# Patient Record
Sex: Male | Born: 1955
Health system: Southern US, Community
[De-identification: ages and names within clinical notes are randomized; demographics above are authoritative.]

## PROBLEM LIST (undated history)

## (undated) DIAGNOSIS — N200 Calculus of kidney: Secondary | ICD-10-CM

## (undated) DIAGNOSIS — I483 Typical atrial flutter: Secondary | ICD-10-CM

## (undated) DIAGNOSIS — I480 Paroxysmal atrial fibrillation: Principal | ICD-10-CM

## (undated) DIAGNOSIS — R001 Bradycardia, unspecified: Secondary | ICD-10-CM

## (undated) DIAGNOSIS — E785 Hyperlipidemia, unspecified: Secondary | ICD-10-CM

## (undated) DIAGNOSIS — S8012XA Contusion of left lower leg, initial encounter: Secondary | ICD-10-CM

## (undated) DIAGNOSIS — C4491 Basal cell carcinoma of skin, unspecified: Secondary | ICD-10-CM

## (undated) DIAGNOSIS — G890 Central pain syndrome: Secondary | ICD-10-CM

## (undated) DIAGNOSIS — I77810 Thoracic aortic ectasia: Secondary | ICD-10-CM

## (undated) DIAGNOSIS — I6522 Occlusion and stenosis of left carotid artery: Secondary | ICD-10-CM

## (undated) DIAGNOSIS — G629 Polyneuropathy, unspecified: Secondary | ICD-10-CM

## (undated) DIAGNOSIS — I63512 Cerebral infarction due to unspecified occlusion or stenosis of left middle cerebral artery: Secondary | ICD-10-CM

## (undated) DIAGNOSIS — IMO0002 Reserved for concepts with insufficient information to code with codable children: Secondary | ICD-10-CM

## (undated) HISTORY — DX: Basal cell carcinoma of skin, unspecified: C44.91

## (undated) HISTORY — DX: Reserved for concepts with insufficient information to code with codable children: IMO0002

## (undated) HISTORY — DX: Calculus of kidney: N20.0

## (undated) HISTORY — DX: Polyneuropathy, unspecified: G62.9

## (undated) HISTORY — DX: Contusion of left lower leg, initial encounter: S80.12XA

## (undated) HISTORY — DX: Thoracic aortic ectasia: I77.810

## (undated) HISTORY — DX: Central pain syndrome: G89.0

## (undated) HISTORY — DX: Hyperlipidemia, unspecified: E78.5

## (undated) HISTORY — DX: Cerebral infarction due to unspecified occlusion or stenosis of left middle cerebral artery: I63.512

## (undated) HISTORY — PX: VASECTOMY: SHX75

## (undated) HISTORY — DX: Typical atrial flutter: I48.3

## (undated) HISTORY — PX: LITHOTRIPSY: SUR834

## (undated) HISTORY — DX: Occlusion and stenosis of left carotid artery: I65.22

## (undated) HISTORY — DX: Paroxysmal atrial fibrillation: I48.0

## (undated) HISTORY — DX: Bradycardia, unspecified: R00.1

---

## 1997-10-07 ENCOUNTER — Emergency Department (HOSPITAL_COMMUNITY): Admission: EM | Admit: 1997-10-07 | Discharge: 1997-10-07 | Payer: Self-pay | Admitting: Emergency Medicine

## 2008-03-09 ENCOUNTER — Ambulatory Visit: Payer: Self-pay | Admitting: Internal Medicine

## 2008-03-12 ENCOUNTER — Inpatient Hospital Stay (HOSPITAL_COMMUNITY): Admission: RE | Admit: 2008-03-12 | Discharge: 2008-03-12 | Payer: Self-pay | Admitting: General Surgery

## 2008-03-19 ENCOUNTER — Ambulatory Visit (HOSPITAL_COMMUNITY): Admission: RE | Admit: 2008-03-19 | Discharge: 2008-03-19 | Payer: Self-pay | Admitting: Urology

## 2008-03-27 ENCOUNTER — Ambulatory Visit (HOSPITAL_COMMUNITY): Admission: RE | Admit: 2008-03-27 | Discharge: 2008-03-27 | Payer: Self-pay | Admitting: Urology

## 2008-03-29 ENCOUNTER — Emergency Department (HOSPITAL_COMMUNITY): Admission: EM | Admit: 2008-03-29 | Discharge: 2008-03-29 | Payer: Self-pay | Admitting: Emergency Medicine

## 2008-06-27 ENCOUNTER — Emergency Department (HOSPITAL_COMMUNITY): Admission: EM | Admit: 2008-06-27 | Discharge: 2008-06-28 | Payer: Self-pay | Admitting: Emergency Medicine

## 2010-03-20 ENCOUNTER — Encounter: Payer: Self-pay | Admitting: Urology

## 2010-06-07 LAB — URINALYSIS, ROUTINE W REFLEX MICROSCOPIC
Glucose, UA: NEGATIVE mg/dL
Ketones, ur: NEGATIVE mg/dL
Leukocytes, UA: NEGATIVE
Nitrite: NEGATIVE
Protein, ur: NEGATIVE mg/dL
pH: 6 (ref 5.0–8.0)

## 2010-06-07 LAB — URINE MICROSCOPIC-ADD ON

## 2010-06-13 LAB — BASIC METABOLIC PANEL
BUN: 10 mg/dL (ref 6–23)
BUN: 9 mg/dL (ref 6–23)
CO2: 27 mEq/L (ref 19–32)
CO2: 29 mEq/L (ref 19–32)
Calcium: 8.6 mg/dL (ref 8.4–10.5)
Calcium: 9.1 mg/dL (ref 8.4–10.5)
Chloride: 104 mEq/L (ref 96–112)
Creatinine, Ser: 0.85 mg/dL (ref 0.4–1.5)
Creatinine, Ser: 0.9 mg/dL (ref 0.4–1.5)
Creatinine, Ser: 0.98 mg/dL (ref 0.4–1.5)
GFR calc Af Amer: >60 mL/min (ref >60)
GFR calc Af Amer: >60 mL/min (ref >60)
GFR calc non Af Amer: >60 mL/min (ref >60)
GFR calc non Af Amer: >60 mL/min (ref >60)
GFR calc non Af Amer: >60 mL/min (ref >60)
Glucose, Bld: 116 mg/dL — ABNORMAL HIGH (ref 70–99)
Glucose, Bld: 98 mg/dL (ref 70–99)
Potassium: 3.9 mEq/L (ref 3.5–5.1)
Potassium: 3.9 mEq/L (ref 3.5–5.1)
Sodium: 139 mEq/L (ref 135–145)

## 2010-06-13 LAB — CBC
HCT: 39.3 % (ref 39.0–52.0)
Hemoglobin: 14.4 g/dL (ref 13.0–17.0)
MCHC: 33.6 g/dL (ref 30.0–36.0)
MCHC: 33.9 g/dL (ref 30.0–36.0)
Platelets: 239 10*3/uL (ref 150–400)
Platelets: 246 10*3/uL (ref 150–400)
Platelets: 268 10*3/uL (ref 150–400)
RBC: 4.31 MIL/uL (ref 4.22–5.81)
RDW: 12.5 % (ref 11.5–15.5)
RDW: 12.8 % (ref 11.5–15.5)
WBC: 14.3 10*3/uL — ABNORMAL HIGH (ref 4.0–10.5)

## 2010-06-13 LAB — DIFFERENTIAL
Basophils Absolute: 0 10*3/uL (ref 0.0–0.1)
Basophils Relative: 0 % (ref 0–1)
Monocytes Absolute: 0.3 10*3/uL (ref 0.1–1.0)
Neutro Abs: 12.4 10*3/uL — ABNORMAL HIGH (ref 1.7–7.7)
Neutrophils Relative %: 88 % — ABNORMAL HIGH (ref 43–77)

## 2010-06-13 LAB — HEMOGLOBIN AND HEMATOCRIT, BLOOD
HCT: 38 % — ABNORMAL LOW (ref 39.0–52.0)
Hemoglobin: 12.8 g/dL — ABNORMAL LOW (ref 13.0–17.0)

## 2010-06-13 LAB — TYPE AND SCREEN
ABO/RH(D): A POS
Antibody Screen: NEGATIVE

## 2010-07-12 NOTE — Op Note (Signed)
NAMEKANON, [name]            ACCOUNT NO.:  [id number]   MEDICAL RECORD NO.:  [id number]          PATIENT TYPE:  AMB   LOCATION:  DAY                          FACILITY:  [facility]   PHYSICIAN:  Bertram Millard. Dahlstedt, M.D.DATE OF BIRTH:  09-10-1955   DATE OF PROCEDURE:  03/27/2008  DATE OF DISCHARGE:                               OPERATIVE REPORT   SURGEON:  Bertram Millard. Dahlstedt, M.D.   FIRST ASSISTANT:  Delia Chimes, NP   ANESTHESIA:  General.   COMPLICATIONS:  None.   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  He  was originally diagnosed 14 years ago.  He has been delaying treatment.   The patient has undergone prior percutaneous nephrolithotomy two weeks  ago followed by a lithotripsy of a difficult to access mid/upper pole  stone.  He now has stones in his lower pole calyx.  He appears today for  second look percutaneous nephrolithotomy.  The risks and complications  were discussed with the patient.  He understands these and desires to  proceed.   DESCRIPTION OF PROCEDURE:  The patient was administered preoperative IV  antibiotics.  He was identified in the holding area, and the surgical  site marked.  He was taken to the operating room where general  anesthetic was administered using endotracheal apparatus.  He was placed  in the prone position.  His bladder was catheterized.  His right flank  was prepped and draped.  With fluoroscopic guidance, the previously-  placed percutaneous nephrostomy tube was removed, and a guide wire  placed for access to the right renal pelvis.  I then dilated this to 55  [place] with nephrostomy dilators.  We then attached a flexible  cystoscope.  Multiple stones were identified.  A guide wire was placed  down into the ureter.  Because of the flow of the water, and the size of  the stone, it was hard to grasp these with the flexible scope.  I  then  dilated the tract to 8 [place] and left the nephrostomy sheath in.  I  then used a rigid scope to remove a significant number of fragments.  Most of these have settled down to the lower pole calyx medially.  Some  of these large fragments were still in the pelvis, however.  It took  about an hour and a half to remove these multiple fragments.  A couple  of the fragments have been dislodged into the nephrostomy tract.  These  were eventually grasped towards the end of the procedure.  There were  several very tiny fragments left, some of them fluoroscopically and some  nephroscopically, I could not see any sizable fragments left.  After  vigorous identification of all the calyces I could see and no large  fragments left behind, I stopped the procedure.  I left an 18-French  council-tip catheter in the renal pelvis, with a balloon filled with  approximately 4 cc of saline.  This was verified with contrast and  showed adequate placement.  The catheter  was sutured to the skin with 3-  0 nylon.  It was  then hooked to dependent drainage.  the patient tolerated the procedure  well.  All stone fragments were given to the patient's wife.  He will be  sent home to follow up in 3 days.  He was discharged on Cipro 500 mg  daily for 5 days and Vicodin ES 1 p.o. q 4 hours p.r.n. pain.      Bertram Millard. Dahlstedt, M.D.  Electronically Signed     SMD/MEDQ  D:  03/27/2008  T:  03/27/2008  Job:  [id number]

## 2010-07-12 NOTE — Discharge Summary (Signed)
NAMESHARVIL, [name]            ACCOUNT NO.:  [id number]   MEDICAL RECORD NO.:  [id number]          PATIENT TYPE:  INP   LOCATION:  1415                         FACILITY:  [facility]   PHYSICIAN:  Bertram Millard. Dahlstedt, M.D.DATE OF BIRTH:  1955-10-05   DATE OF ADMISSION:  03/09/2008  DATE OF DISCHARGE:  03/12/2008                               DISCHARGE SUMMARY   DISCHARGE DIAGNOSIS:  1. Right staghorn stone, status post first-stage percutaneous      nephrolithotomy.  2. Episodic atrial fibrillation, resolved.   PROCEDURES:  Right percutaneous nephrolithotomy done March 09, 2008.   HISTORY OF PRESENT ILLNESS:  In brief, [name] is a 55 year old  gentleman who was admitted after his right first-look percutaneous  nephrolithotomy.  He has a history of stone disease and after discussion  of the risks and benefits of percutaneous nephrolithotomy and  alternative therapy, he elected to proceed with the aforementioned  surgery.   HOSPITAL COURSE:  The patient was admitted after his right-sided  percutaneous nephrolithotomy.  The patient's nephrostomy tube was  downsized to a smaller tube on postoperative day #1 in interventional  radiology.  His Foley catheter was removed and he voided well.  He  tolerated a regular diet.  However on postoperative night #1 the patient  had one episode of atrial fibrillation.  He converted to a regular  rhythm on a calcium channel blocker IV drip.  He was then converted to  oral metoprolol after cardiac consultation and did well subsequently.  At the time of discharge the patient was ambulating, tolerating a  regular diet, in a regular rhythm and had adequate pain control.  Follow-  up arrangements had been made with the patient prior to his discharge.   DISCHARGE DISPOSITION:  He will be discharged home in good condition.   FOLLOW UP:  1. He will follow up with neurology for further management of the      stones.  2. He will follow up with  cardiology per their recommendations.   DISCHARGE MEDICATIONS:  Please see reconciliation sheet.      Matthew Kitten, MD      Bertram Millard. Dahlstedt, M.D.  Electronically Signed    DW/MEDQ  D:  03/13/2008  T:  03/13/2008  Job:  [id number]

## 2010-07-12 NOTE — Consult Note (Signed)
NAMEMARCQUES, [name]            ACCOUNT NO.:  [id number]   MEDICAL RECORD NO.:  [id number]          PATIENT TYPE:  AMB   LOCATION:  DAY                          FACILITY:  [facility]   PHYSICIAN:  Wendi Snipes, MD DATE OF BIRTH:  1955-10-26   DATE OF CONSULTATION:  DATE OF DISCHARGE:                                 CONSULTATION   CHIEF COMPLAINT:  Racing heart.   HISTORY OF PRESENT ILLNESS:  This is a 55 year old white male with a  history of kidney stones who is here status post percutaneous  nephrolithotomy, now found to be in atrial fibrillation with rapid  ventricular response.  The patient states that he reported palpitations  around 12 o'clock, and it appears that he went into atrial fibrillation  shortly after 12 o'clock in a.m. on the morning of March 11, 2008.  He  states that he has had this condition once before.  However, he states  that spontaneously converted to normal sinus before treatment.  This  happened years ago.  Otherwise, he states that he has not experienced  palpitations during this time.  Denies chest pain, shortness of breath,  paroxysmal nocturnal dyspnea though he states that he is constipated.   PAST MEDICAL HISTORY:  1. Kidney stones.  2. Horseshoe kidney.   MEDICATIONS AT HOME:  Allegra D.   ALLERGIES:  NO KNOWN DRUG ALLERGIES.   SOCIAL HISTORY:  He is a nonsmoker.   FAMILY HISTORY:  His father died of congestive heart failure at 6.   REVIEW OF SYSTEMS:  All 14 systems were reviewed and were negative  except as mentioned in HPI.   PHYSICAL EXAMINATION:  VITAL SIGNS:  Blood pressure is 120/80, heart  rate is 143 beats per minute, respirations 16.  GENERAL:  He is a 55 year old white male appearing stated age.  No acute  distress.  HEENT:  Moist mucous membranes.  Pupils equal, round, reactive to light  and accommodation.  Anicteric sclera.  NECK:  No jugular venous distention.  No thyromegaly.  CARDIOVASCULAR:  Irregularly irregular  rhythm, tachycardiac.  No  murmurs, rubs or gallops.  PULMONARY:  Clear to auscultation bilaterally.  ABDOMEN:  Nontender, nondistended.  Positive bowel sounds.  No masses.  EXTREMITIES:  No clubbing, cyanosis or edema, 2+ pulses throughout.  NEUROLOGICAL:  Alert and oriented x3.  Cranial nerves II-XII grossly  intact.  No focal neurologic deficits.  SKIN:  Warm, dry and intact.  No rashes.  There is a percutaneous tube  that is draining on his right flank.  PSYCHIATRIC:  Mood and affect are appropriate.   STUDIES:  EKG shows atrial fibrillation with rapid ventricular response  and ST and T-wave abnormality.  Telemetry showed normal sinus rhythm  with intermittent atrial fibrillation.   ASSESSMENT/PLAN:  New-onset atrial fibrillation.  This is likely driven  by elevated catecholamines in his post procedural state.  He should  convert with the rate control.  In this context, beta blockade would be  appropriate given that he has intermittent sinus rhythm.  Will give him  metoprolol 5 mg IV now and then start p.o. dose of  25 mg twice daily.  Will recommend check an echocardiogram and the patient will be a good  candidate for DC cardioversion in the morning if atrial fibrillation  persists.      Wendi Snipes, MD  Electronically Signed     BHH/MEDQ  D:  03/11/2008  T:  03/11/2008  Job:  [id number]

## 2010-07-12 NOTE — Op Note (Signed)
[name], [name]            ACCOUNT NO.:  [id number]   MEDICAL RECORD NO.:  [id number]          PATIENT TYPE:  AMB   LOCATION:  DAY                          FACILITY:  [facility]   PHYSICIAN:  Bertram Millard. Dahlstedt, M.D.DATE OF BIRTH:  06-11-1955   DATE OF PROCEDURE:  03/09/2008  DATE OF DISCHARGE:                               OPERATIVE REPORT   ASSISTANT:  Dr. Duane Boston.   PREOPERATIVE DIAGNOSIS:  Right renal stone, greater than 2 cm.   POSTOPERATIVE DIAGNOSIS:  Right renal stone, greater than 2 cm.   PROCEDURES:  1. Percutaneous dilation of nephrostomy tract.  2. Rigid renoscopy with lithoclast lithotripsy.  3. Renoscopy with laser lithotripsy and stone extraction.  4. Placement of the right nephrostomy tube.   INDICATIONS:  This is a 55 year old gentleman with a history of a large  right renal stone.  After a discussion of the risks and benefits,  including alternatives, he elected to proceed with a right percutaneous  nephrolithotomy.   PROCEDURE IN DETAIL:  The patient was brought back to the operating room  and after the successful induction of general endotracheal anesthetic, a  Foley catheter was placed and he was placed in the prone position.  All  pressure points were padded appropriately and chest rolls were in place.  A preoperative timeout was performed.  He received preoperative  antibiotics.   With the help of the interventional radiologist, his previously placed  access sheath sites were both dilated with balloon dilator and access  sheaths were placed over this.  Renoscopy was done and a large stone  could be seen in his right renal unit.  Using renoscopy and a lithoclast  device, the stone was fragmented.  The larger fragments were removed  with a two-prong grasper.  The rest were removed with suction with the  lithoclast device.  Once we achieved access to the pelvis and the UPJ,  we worked on the stone in the upper collecting system.  At this point,  we reached the limits of our renoscope and used the flexible cystoscope  to navigate in the upper pole.  Using the laser, we lasered part of the  large upper pole stone, however, the laser to break.  At this point, we  removed the flexible scope and dilated the lower pole access.  An access  sheath was placed and we were unsuccessful in using this access site to  get direct visualization of the upper stone.  At this point given the  length of the case and multiple access sites uses, we elected to  terminate the procedure.  The lower pole access sheath was removed and  using the interpolar access, we placed a nephrostomy tube under direct  fluoroscopic visualization.  Four mL of water were placed in the balloon  and drain was sutured to the skin.  All the tubes and wires were  removed.  At this point, we used silk to close the skin incision at the  lower pole access site and the procedure was ended.  Please note, Dr.  Retta Diones was present throughout the entire case.  Estimated blood loss  minimal.  Urine output unrecorded.   DRAINS:  1. 18-French right nephrostomy tube.  2. Foley catheter.   SPECIMEN:  Stone for stone analysis.   DISPOSITION:  The patient will be sent to the PACU for further care.      Delman Kitten, MD      Bertram Millard. Dahlstedt, M.D.  Electronically Signed    DW/MEDQ  D:  03/09/2008  T:  03/09/2008  Job:  [id number]

## 2013-07-06 ENCOUNTER — Encounter: Payer: Self-pay | Admitting: *Deleted

## 2013-07-06 DIAGNOSIS — I4891 Unspecified atrial fibrillation: Secondary | ICD-10-CM

## 2014-02-27 DIAGNOSIS — I639 Cerebral infarction, unspecified: Secondary | ICD-10-CM

## 2014-02-27 HISTORY — DX: Cerebral infarction, unspecified: I63.9

## 2014-05-19 ENCOUNTER — Encounter: Payer: Self-pay | Admitting: Cardiology

## 2014-05-19 ENCOUNTER — Ambulatory Visit (INDEPENDENT_AMBULATORY_CARE_PROVIDER_SITE_OTHER): Payer: BLUE CROSS/BLUE SHIELD | Admitting: Cardiology

## 2014-05-19 VITALS — BP 110/72 | HR 65 | Ht 74.0 in | Wt 209.0 lb

## 2014-05-19 DIAGNOSIS — R002 Palpitations: Secondary | ICD-10-CM

## 2014-05-19 DIAGNOSIS — I480 Paroxysmal atrial fibrillation: Secondary | ICD-10-CM

## 2014-05-19 DIAGNOSIS — R0602 Shortness of breath: Secondary | ICD-10-CM | POA: Diagnosis not present

## 2014-05-19 NOTE — Progress Notes (Signed)
Cardiology Office Note   Date:  05/19/2014   ID:  FAMOUS EISENHARDT, DOB 09/24/1955, MRN [id number]  PCP:  No primary care provider on file.  Cardiologist:   Sueanne Margarita, MD   No chief complaint on file.     History of Present Illness: [name] is a 59 y.o. male who presents for evaluation of SOB.  He had a normal stres test in January of 2010.  He had a bout of atrial fibrillation after kidney surgery and was on blood thinners for a while and stopped this on his own and has not followed back up with Cardiology.  His CHADS2VASC score is 0.  He was recently seen by Dr. Rex Kras complaining of several months of SOB with exertion playing tennis.  He plays weekly and has really noticed over the past month that he does not feel right when he plays.  He denies any chest pain, dizziness or LE edema.  He has also noticed that his heart is racing on occasion.  He has tried to cut down on caffeine which has helped some.  He denies any exercise intolerance.     Past Medical History  Diagnosis Date  . Atrial fibrillation   . Basal cell carcinoma   . Nephrolithiasis     Past Surgical History  Procedure Laterality Date  . Lithotripsy    . Vasectomy       Current Outpatient Prescriptions  Medication Sig Dispense Refill  . aspirin 81 MG tablet Take 81 mg by mouth daily.     No current facility-administered medications for this visit.    Allergies:   Review of patient's allergies indicates no known allergies.    Social History:  The patient  reports that he has never smoked. He does not have any smokeless tobacco history on file. He reports that he drinks alcohol. He reports that he does not use illicit drugs.   Family History:  The patient's family history includes Cancer in his mother; Heart failure in his father.    ROS:  Please see the history of present illness.   Otherwise, review of systems are positive for none.   All other systems are reviewed and negative.     PHYSICAL EXAM: VS:  BP 110/72 mmHg  Pulse 65  Ht 6\' 2"  (1.88 m)  Wt 209 lb (94.802 kg)  BMI 26.82 kg/m2 , BMI Body mass index is 26.82 kg/(m^2). GEN: Well nourished, well developed, in no acute distress HEENT: normal Neck: no JVD, carotid bruits, or masses Cardiac: RRR; no murmurs, rubs, or gallops,no edema  Respiratory:  clear to auscultation bilaterally, normal work of breathing GI: soft, nontender, nondistended, + BS MS: no deformity or atrophy Skin: warm and dry, no rash Neuro:  Strength and sensation are intact Psych: euthymic mood, full affect   EKG:  EKG is ordered today. The ekg ordered today demonstrates NSR at 65bpm with no ST changes and Bi atrial enlargement.     Recent Labs: No results found for requested labs within last 365 days.    Lipid Panel No results found for: CHOL, TRIG, HDL, CHOLHDL, VLDL, LDLCALC, LDLDIRECT    Wt Readings from Last 3 Encounters:  05/19/14 209 lb (94.802 kg)        ASSESSMENT AND PLAN:  1.  SOB with exertion mainly playing tennis but other activities do not bother him.  He denies any chest pain or pressure.  EKG is on ischemic.  I will check a 2D  echo to assess LVF.  I will check a stress myoview to rule out ischemia. 2.  PAF in the setting of kidney stone surgery with no reoccurence.  His CHADS2VASC score is 0.  He is in NSR today. 3.  Palpitations which he has been aware of recently.  Will get an event monitor to assess for PAF.  They do not occur frequently enough to be captured on a 24 hour Holter so need a 30 day monitor.       Current medicines are reviewed at length with the patient today.  The patient does not have concerns regarding medicines.  The following changes have been made:  no change  Labs/ tests ordered today include: stress myoview, 2D echo to assess LVF, 30 day event monitor  No orders of the defined types were placed in this encounter.     Disposition:   FU with me in 1  year   Signed, Sueanne Margarita, MD  05/19/2014 2:13 PM    [facility] [address], [place], [place]  [address] Phone: [phone]; Fax: [phone]

## 2014-05-19 NOTE — Patient Instructions (Addendum)
Your physician has requested that you have an echocardiogram. Echocardiography is a painless test that uses sound waves to create images of your heart. It provides your doctor with information about the size and shape of your heart and how well your heart's chambers and valves are working. This procedure takes approximately one hour. There are no restrictions for this procedure.  Dr. Radford Pax recommends you have a NUCLEAR STRESS TEST.  Your physician has recommended that you wear an event monitor. Event monitors are medical devices that record the heart's electrical activity. Doctors most often [place] these monitors to diagnose arrhythmias. Arrhythmias are problems with the speed or rhythm of the heartbeat. The monitor is a small, portable device. You can wear one while you do your normal daily activities. This is usually used to diagnose what is causing palpitations/syncope (passing out).  Your physician wants you to follow-up in: 1 year with Dr. Radford Pax. You will receive a reminder letter in the mail two months in advance. If you don't receive a letter, please call our office to schedule the follow-up appointment.

## 2014-05-20 ENCOUNTER — Ambulatory Visit (HOSPITAL_COMMUNITY): Payer: BLUE CROSS/BLUE SHIELD | Attending: Cardiology | Admitting: Radiology

## 2014-05-20 DIAGNOSIS — R0602 Shortness of breath: Secondary | ICD-10-CM | POA: Diagnosis not present

## 2014-05-20 DIAGNOSIS — I480 Paroxysmal atrial fibrillation: Secondary | ICD-10-CM

## 2014-05-20 NOTE — Progress Notes (Signed)
Echocardiogram performed.  

## 2014-05-22 ENCOUNTER — Telehealth: Payer: Self-pay

## 2014-05-22 DIAGNOSIS — R0602 Shortness of breath: Secondary | ICD-10-CM

## 2014-05-22 NOTE — Telephone Encounter (Signed)
Lab appointment scheduled for Tuesday and BNP ordered.

## 2014-05-22 NOTE — Telephone Encounter (Signed)
-----   Message from Antonieta Iba, RN sent at 05/21/2014 12:20 PM EDT ----- Patient called back to get ECHO results from yesterday. Notified patient of results and notations by Dr. Radford Pax with request for him to come in for BNP. Patient verbalized understanding and agreement with plan. States he can come in for the blood work next Tuesday, 05/26/14.

## 2014-05-26 ENCOUNTER — Other Ambulatory Visit (INDEPENDENT_AMBULATORY_CARE_PROVIDER_SITE_OTHER): Payer: BLUE CROSS/BLUE SHIELD | Admitting: *Deleted

## 2014-05-26 DIAGNOSIS — R0602 Shortness of breath: Secondary | ICD-10-CM

## 2014-05-26 LAB — BRAIN NATRIURETIC PEPTIDE: Pro B Natriuretic peptide (BNP): 60 pg/mL (ref 0.0–100.0)

## 2014-05-28 ENCOUNTER — Ambulatory Visit (HOSPITAL_COMMUNITY): Payer: BLUE CROSS/BLUE SHIELD | Attending: Cardiovascular Disease | Admitting: Radiology

## 2014-05-28 DIAGNOSIS — R0602 Shortness of breath: Secondary | ICD-10-CM | POA: Diagnosis not present

## 2014-05-28 MED ORDER — TECHNETIUM TC 99M SESTAMIBI GENERIC - CARDIOLITE
11.0000 | Freq: Once | INTRAVENOUS | Status: AC | PRN
  Administered 2014-05-28: 11 via INTRAVENOUS

## 2014-05-28 MED ORDER — TECHNETIUM TC 99M SESTAMIBI GENERIC - CARDIOLITE
33.0000 | Freq: Once | INTRAVENOUS | Status: AC | PRN
  Administered 2014-05-28: 33 via INTRAVENOUS

## 2014-05-28 NOTE — Progress Notes (Signed)
[name] [address] [place], [place] [address] [phone]    Cardiology Nuclear Med Study  [name] is a 59 y.o. male     MRN : [id number]     DOB: September 07, 1955  Procedure Date: 05/28/2014  Nuclear Med Background Indication for Stress Test:  Evaluation for Ischemia, and Surgical Clearance for (L) Ear Surgery on 07-23-2014 by Radene Journey, MD History:  MPI 2010 (ok per patient) Cardiac Risk Factors: None  Symptoms:  DOE, Palpitations and SOB   Nuclear Pre-Procedure Caffeine/Decaff Intake:  None> 12hrs NPO After: 11:00pm   Lungs:  clear O2 Sat: 97% on room air. IV 0.9% NS with Angio Cath:  22g  IV Site: R Antecubital x 1, tolerated well IV Started by:  Irven Baltimore, RN  Chest Size (in):  42 Cup Size: n/a  Height: 6\' 2"  (1.88 m)  Weight:  206 lb (93.441 kg)  BMI:  Body mass index is 26.44 kg/(m^2). Tech Comments:  N/A    Nuclear Med Study 1 or 2 day study: 1 day  Stress Test Type:  Stress  Reading MD: N/A  Order Authorizing Provider:  Fransico Him, MD  Resting Radionuclide: Technetium 37m Sestamibi  Resting Radionuclide Dose: 11.0 mCi   Stress Radionuclide:  Technetium 85m Sestamibi  Stress Radionuclide Dose: 33.0 mCi           Stress Protocol Rest HR: 54 Stress HR: 148  Rest BP: 132/76 Stress BP: 155/67  Exercise Time (min): 11:15 METS: 13.4   Predicted Max HR: 159 bpm % Max HR: 93.08 bpm Rate Pressure Product: 74128   Dose of Adenosine (mg):  n/a Dose of Lexiscan: n/a mg  Dose of Atropine (mg): n/a Dose of Dobutamine: n/a mcg/kg/min (at max HR)  Stress Test Technologist: Glade Lloyd, BS-ES  Nuclear Technologist:  Earl Many, CNMT     Rest Procedure:  Myocardial perfusion imaging was performed at rest 45 minutes following the intravenous administration of Technetium 36m Sestamibi. Rest ECG: NSR - Normal EKG  Stress Procedure:  The patient exercised on the treadmill utilizing the Bruce Protocol for 11:15 minutes.  The patient stopped due to fatigue and denied any chest pain.  Technetium 6m Sestamibi was injected at peak exercise and myocardial perfusion imaging was performed after a brief delay. Stress ECG: Borderline 1 mm horizontal ST depression in leads V6 and aVF  QPS Raw Data Images:  Normal; no motion artifact; normal heart/lung ratio. Stress Images:  Normal homogeneous uptake in all areas of the myocardium. Rest Images:  Normal homogeneous uptake in all areas of the myocardium. Subtraction (SDS):  No evidence of ischemia. Transient Ischemic Dilatation (Normal <1.22):  0.90 Lung/Heart Ratio (Normal <0.45):  0.37  Quantitative Gated Spect Images QGS EDV:  119 ml QGS ESV:  44 ml  Impression Exercise Capacity:  Good exercise capacity. BP Response:  Normal blood pressure response. Clinical Symptoms:  No significant symptoms noted. ECG Impression:  Borderline ST abnormalities consistent with ischemia. Comparison with Prior Nuclear Study: No previous nuclear study performed  Overall Impression:  Normal stress nuclear study.Borderline ECG changes may represent a "false positive response".  LV Ejection Fraction: 63%.  LV Wall Motion:  NL LV Function; NL Wall Motion   Sanda Klein, MD, [facility] [phone] office [phone] pager

## 2014-06-01 ENCOUNTER — Telehealth: Payer: Self-pay | Admitting: Cardiology

## 2014-06-01 NOTE — Telephone Encounter (Signed)
New Message      Pt calling stating that he isn't supposed to get his monitor until 06/22/14 b/c he will be going on vacation before than and didn't want to have to wear the monitor while at the beach. Pt also states that his upcoming ear surgery is scheduled for 07/23/14 and he wants to know if he needs to get monitor earlier than when he is scheduled to get it because he doesn't know if there will be enough time in between when he is supposed to be turning in his monitor and when he is supposed to have his surgery for Dr. Radford Pax to have all of the information she needs. Please call back and advise.

## 2014-06-01 NOTE — Telephone Encounter (Signed)
Calling back after I spoke w/him notifying him of stress test results.  States he forgot to ask that he was scheduled for a monitor to be placed but he changed the date because he is getting ready to go to beach.  He rescheduled for 4/25 but then he forgot that he is having ear surgery on 5/26 and wanted to know if would interfere with results.  Advised would not interfere and to come on 4/25 to have monitor placed.  He verbalizes understanding.

## 2014-06-22 ENCOUNTER — Encounter: Payer: Self-pay | Admitting: *Deleted

## 2014-06-22 ENCOUNTER — Telehealth: Payer: Self-pay | Admitting: Cardiology

## 2014-06-22 ENCOUNTER — Encounter (INDEPENDENT_AMBULATORY_CARE_PROVIDER_SITE_OTHER): Payer: BLUE CROSS/BLUE SHIELD

## 2014-06-22 DIAGNOSIS — R002 Palpitations: Secondary | ICD-10-CM

## 2014-06-22 NOTE — Telephone Encounter (Signed)
Event monitor called pt with a fib rates 68-144. I called the pt and no answer I left a message.  His CHA2DS2VASc score is 0.  Will notify Dr. Radford Pax.

## 2014-06-22 NOTE — Progress Notes (Signed)
Patient ID: [name], male   DOB: 1955-11-24, 59 y.o.   MRN: [id number] [employer] 30 day cardiac event monitor applied to patient.

## 2014-06-23 ENCOUNTER — Telehealth: Payer: Self-pay | Admitting: Nurse Practitioner

## 2014-06-23 NOTE — Telephone Encounter (Signed)
Received a call from [employer] reporting that [name] is back in afib between the 140's and low 200's.  They tried to contact him but got no answer.  I called all three numbers listed in Epic and there was no answer.  I left a message at his home number.  Marquette Old 7:11 PM, 06/23/2014

## 2014-06-23 NOTE — Telephone Encounter (Signed)
Have not been able to get in touch with patient.  Please call to let him know that he is having PAF and I would like him to start Lopressor 12.5mg  BID and followup with extender in 1 week - continue to wear heart monitor

## 2014-06-24 MED ORDER — METOPROLOL TARTRATE 25 MG PO TABS: 12.5000 mg | ORAL_TABLET | Freq: Two times a day (BID) | ORAL | Status: DC

## 2014-06-24 NOTE — Addendum Note (Signed)
Addended by: Toni Amend D on: 06/24/2014 08:30 AM   Modules accepted: Orders

## 2014-06-24 NOTE — Telephone Encounter (Signed)
Spoke to patient. He states he was playing tennis when he [employer] said he went into PAF. They advised him to go to ED but he stated he was asymptomatic and knew it was the increased activity that prompted it so he deferred their advisement. He states he plays tennis at least weekly in a league. Advised him that I would inform Dr. Radford Pax his activity and circumstances. Informed him of her recommendations. He verified that he would start the Lopressor 12.5 mg twice daily and he would call back later today to make an appointment with an extender. He also will continue to wear the heart monitor. Provided education on Lopressor and patient indicated understanding and agreement with treatment plan.

## 2014-06-24 NOTE — Telephone Encounter (Signed)
[employer] Event Report faxed over for event of PAF below from 4/26 @ 5:25 pm (cst). DOD, Dr. Acie Fredrickson, reviewed. Dr. Radford Pax will continue to monitor.

## 2014-06-24 NOTE — Telephone Encounter (Signed)
Will continue to monitor.

## 2014-06-24 NOTE — Telephone Encounter (Signed)
Patient notified of Dr. Theodosia Blender response. Patient aware rx sent to pharmacy - he state she will pick it up at lunchtime today.

## 2014-08-05 ENCOUNTER — Telehealth: Payer: Self-pay | Admitting: Cardiology

## 2014-08-05 NOTE — Telephone Encounter (Signed)
Left message to call back  

## 2014-08-05 NOTE — Telephone Encounter (Signed)
Patient appears to have had some PAF for several days after starting metoprolol.   Please find out if he is still having palpitations

## 2014-08-06 ENCOUNTER — Encounter: Payer: Self-pay | Admitting: Cardiology

## 2014-08-06 NOTE — Telephone Encounter (Signed)
This encounter was created in error - please disregard.

## 2014-08-06 NOTE — Telephone Encounter (Signed)
Informed patient of results and verbal understanding expressed.   Patient st that he has not had any noticeable symptoms since starting the metoprolol.  He has no complaints of palpitations at all and says he feels great.

## 2014-08-06 NOTE — Telephone Encounter (Signed)
New problem ° ° °Pt returning your call. °

## 2014-08-08 NOTE — Telephone Encounter (Signed)
Please find out if patient was feeling palpitations when he had his bouts of afib

## 2014-08-12 NOTE — Telephone Encounter (Signed)
Left message to call back  

## 2014-08-27 NOTE — Telephone Encounter (Signed)
Patient st he had palpitations when he had Fib. He complains of no palpitations now and says he feels great - "the metoprolol works wonders." Instructed patient to call back if he has any questions or concerns or develops symptoms.

## 2014-09-03 ENCOUNTER — Encounter: Payer: Self-pay | Admitting: Cardiology

## 2014-09-24 ENCOUNTER — Inpatient Hospital Stay (HOSPITAL_COMMUNITY)
Admission: EM | Admit: 2014-09-24 | Discharge: 2014-09-29 | DRG: 064 | Disposition: A | Discharge status: Rehabilitation Facility | Payer: BLUE CROSS/BLUE SHIELD | Attending: Internal Medicine | Admitting: Internal Medicine

## 2014-09-24 ENCOUNTER — Emergency Department (HOSPITAL_COMMUNITY): Payer: BLUE CROSS/BLUE SHIELD

## 2014-09-24 ENCOUNTER — Encounter (HOSPITAL_COMMUNITY): Payer: Self-pay | Admitting: Emergency Medicine

## 2014-09-24 DIAGNOSIS — I10 Essential (primary) hypertension: Secondary | ICD-10-CM | POA: Diagnosis present

## 2014-09-24 DIAGNOSIS — I6529 Occlusion and stenosis of unspecified carotid artery: Secondary | ICD-10-CM | POA: Insufficient documentation

## 2014-09-24 DIAGNOSIS — I639 Cerebral infarction, unspecified: Secondary | ICD-10-CM | POA: Diagnosis present

## 2014-09-24 DIAGNOSIS — I63412 Cerebral infarction due to embolism of left middle cerebral artery: Principal | ICD-10-CM | POA: Diagnosis present

## 2014-09-24 DIAGNOSIS — I6522 Occlusion and stenosis of left carotid artery: Secondary | ICD-10-CM

## 2014-09-24 DIAGNOSIS — R4701 Aphasia: Secondary | ICD-10-CM | POA: Diagnosis present

## 2014-09-24 DIAGNOSIS — G9340 Encephalopathy, unspecified: Secondary | ICD-10-CM | POA: Diagnosis present

## 2014-09-24 DIAGNOSIS — E785 Hyperlipidemia, unspecified: Secondary | ICD-10-CM | POA: Diagnosis present

## 2014-09-24 DIAGNOSIS — Z85828 Personal history of other malignant neoplasm of skin: Secondary | ICD-10-CM

## 2014-09-24 DIAGNOSIS — Z7982 Long term (current) use of aspirin: Secondary | ICD-10-CM

## 2014-09-24 DIAGNOSIS — I63512 Cerebral infarction due to unspecified occlusion or stenosis of left middle cerebral artery: Secondary | ICD-10-CM

## 2014-09-24 DIAGNOSIS — I4819 Other persistent atrial fibrillation: Secondary | ICD-10-CM | POA: Diagnosis present

## 2014-09-24 DIAGNOSIS — I6523 Occlusion and stenosis of bilateral carotid arteries: Secondary | ICD-10-CM | POA: Diagnosis present

## 2014-09-24 DIAGNOSIS — I480 Paroxysmal atrial fibrillation: Secondary | ICD-10-CM | POA: Diagnosis present

## 2014-09-24 LAB — URINALYSIS, ROUTINE W REFLEX MICROSCOPIC
BILIRUBIN URINE: NEGATIVE
Glucose, UA: NEGATIVE mg/dL
KETONES UR: NEGATIVE mg/dL
Leukocytes, UA: NEGATIVE
Nitrite: NEGATIVE
Protein, ur: NEGATIVE mg/dL
SPECIFIC GRAVITY, URINE: 1.007 (ref 1.005–1.030)
Urobilinogen, UA: 0.2 mg/dL (ref 0.0–1.0)
pH: 7.5 (ref 5.0–8.0)

## 2014-09-24 LAB — PROTIME-INR
INR: 1.17 (ref 0.00–1.49)
Prothrombin Time: 15.1 seconds (ref 11.6–15.2)

## 2014-09-24 LAB — CBC
HEMATOCRIT: 42.2 % (ref 39.0–52.0)
Hemoglobin: 14.8 g/dL (ref 13.0–17.0)
MCH: 31.2 pg (ref 26.0–34.0)
MCHC: 35.1 g/dL (ref 30.0–36.0)
MCV: 88.8 fL (ref 78.0–100.0)
Platelets: 231 10*3/uL (ref 150–400)
RBC: 4.75 MIL/uL (ref 4.22–5.81)
RDW: 12.4 % (ref 11.5–15.5)
WBC: 13.5 10*3/uL — ABNORMAL HIGH (ref 4.0–10.5)

## 2014-09-24 LAB — COMPREHENSIVE METABOLIC PANEL
ALBUMIN: 4.2 g/dL (ref 3.5–5.0)
ALT: 27 U/L (ref 17–63)
AST: 27 U/L (ref 15–41)
Alkaline Phosphatase: 61 U/L (ref 38–126)
Anion gap: 9 (ref 5–15)
BUN: 9 mg/dL (ref 6–20)
CO2: 24 mmol/L (ref 22–32)
Calcium: 9.4 mg/dL (ref 8.9–10.3)
Chloride: 107 mmol/L (ref 101–111)
Creatinine, Ser: 0.96 mg/dL (ref 0.61–1.24)
GFR calc Af Amer: >60 mL/min (ref >60)
Glucose, Bld: 115 mg/dL — ABNORMAL HIGH (ref 65–99)
POTASSIUM: 4 mmol/L (ref 3.5–5.1)
Sodium: 140 mmol/L (ref 135–145)
Total Bilirubin: 1 mg/dL (ref 0.3–1.2)
Total Protein: 7.3 g/dL (ref 6.5–8.1)

## 2014-09-24 LAB — I-STAT CHEM 8, ED
BUN: 12 mg/dL (ref 6–20)
Calcium, Ion: 1.11 mmol/L — ABNORMAL LOW (ref 1.13–1.30)
Chloride: 104 mmol/L (ref 101–111)
Creatinine, Ser: 0.8 mg/dL (ref 0.61–1.24)
Glucose, Bld: 118 mg/dL — ABNORMAL HIGH (ref 65–99)
HCT: 47 % (ref 39.0–52.0)
HEMOGLOBIN: 16 g/dL (ref 13.0–17.0)
POTASSIUM: 4 mmol/L (ref 3.5–5.1)
Sodium: 142 mmol/L (ref 135–145)
TCO2: 21 mmol/L (ref 0–100)

## 2014-09-24 LAB — DIFFERENTIAL
BASOS ABS: 0 10*3/uL (ref 0.0–0.1)
Basophils Relative: 0 % (ref 0–1)
EOS ABS: 0 10*3/uL (ref 0.0–0.7)
Eosinophils Relative: 0 % (ref 0–5)
LYMPHS PCT: 9 % — AB (ref 12–46)
Lymphs Abs: 1.2 10*3/uL (ref 0.7–4.0)
MONO ABS: 0.9 10*3/uL (ref 0.1–1.0)
MONOS PCT: 7 % (ref 3–12)
Neutro Abs: 11.4 10*3/uL — ABNORMAL HIGH (ref 1.7–7.7)
Neutrophils Relative %: 84 % — ABNORMAL HIGH (ref 43–77)

## 2014-09-24 LAB — APTT: aPTT: 26 seconds (ref 24–37)

## 2014-09-24 LAB — URINE MICROSCOPIC-ADD ON

## 2014-09-24 LAB — I-STAT TROPONIN, ED: Troponin i, poc: 0 ng/mL (ref 0.00–0.08)

## 2014-09-24 MED ORDER — LORAZEPAM 2 MG/ML IJ SOLN
1.0000 mg | Freq: Once | INTRAMUSCULAR | Status: AC
  Administered 2014-09-24: 1 mg via INTRAVENOUS
  Filled 2014-09-24: qty 1

## 2014-09-24 NOTE — ED Notes (Signed)
PA Upstill confirm not code stroke/carelink not called @2220 .

## 2014-09-24 NOTE — ED Provider Notes (Signed)
Patient presents to the emergency room for altered mental status after having a surgical procedure this morning about 0 7:30. She had a right tympanoplasty from 0 7:30 in the morning to 12:30. After the patient completed the surgery he was confused and not following commands appropriately. They felt the symptoms initially related to his procedure. Patient is having persistent confusion. He was sent to the emergency room for evaluation. Patient's time of last known normal was at 0 700. Physical Exam  BP 145/70 mmHg  Pulse 70  Temp(Src) 98.6 F (37 C) (Oral)  Resp 13  SpO2 99%  Physical Exam  Constitutional: He appears well-developed and well-nourished. No distress.  HENT:  Head: Normocephalic and atraumatic.  Right Ear: External ear normal.  Left Ear: External ear normal.  Eyes: Conjunctivae are normal. Right eye exhibits no discharge. Left eye exhibits no discharge. No scleral icterus.  Neck: Neck supple. No tracheal deviation present.  Cardiovascular: Normal rate.   Pulmonary/Chest: Effort normal. No stridor. No respiratory distress.  Musculoskeletal: He exhibits no edema.  Neurological: He is alert. He is disoriented. Cranial nerve deficit: no gross deficits.  Confused, I asked the patient what his name was he answered [name]. when I asked the patient where he is located he answered "[name]", patient does not follow commands to be moving all 4 extremities, no obvious facial droop  Skin: Skin is warm and dry. No rash noted.  Psychiatric: He has a normal mood and affect.  Nursing note and vitals reviewed.   ED Course  Procedures Ct Head Wo Contrast  09/24/2014   CLINICAL DATA:  Confused. Altered mental status. Right leg weakness. Last seen normal 06:50 a.m.  EXAM: CT HEAD WITHOUT CONTRAST  TECHNIQUE: Contiguous axial images were obtained from the base of the skull through the vertex without intravenous contrast.  COMPARISON:  None.  FINDINGS: There is hypodensity consistent with  cytotoxic edema in the posterior left temporal lobe and left parietal lobe. There is moderate sulcal effacement and minimal mass effect on the left lateral ventricle. There is no midline shift. Basal cisterns remain patent. There is a hyperdense left MCA. The findings are consistent with an acute nonhemorrhagic infarction. There is no intracranial hemorrhage. There is no extra-axial fluid collection.  Incidental note is made of fluid or soft tissue in the right external ear and middle ear, presumably related to the recent surgery.  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 Mild mass effect with sulcal effacement but no midline shift.   Electronically Signed   By: Andreas Newport M.D.   On: 09/24/2014 23:23    MDM CT scan show an acute stroke.  Unfortunately pt is outside code stroke window.  Admit to the hospital for further treatment.       Dorie Rank, MD 09/25/14 [phone]

## 2014-09-24 NOTE — ED Notes (Signed)
Pt wife called out for help. Assisted patient to use the urinal. Pt voided 549ml. Pt speaking words that are unrelated and not making sense. PA Shari informed.

## 2014-09-24 NOTE — ED Notes (Signed)
Patient is resting comfortably. 

## 2014-09-24 NOTE — ED Notes (Signed)
Patient transported to CT with Sherlyn Lick.

## 2014-09-24 NOTE — ED Provider Notes (Signed)
CSN: [id number]     Arrival date & time 09/24/14  2149 History   First MD Initiated Contact with Patient 09/24/14 2207     Chief Complaint  Patient presents with  . Altered Mental Status  . Extremity Weakness     (Consider location/radiation/quality/duration/timing/severity/associated sxs/prior Treatment) Patient is a 59 y.o. male presenting with altered mental status and extremity weakness.  Altered Mental Status Presenting symptoms: behavior changes and disorientation   Associated symptoms comment:  Per the patient's wife, the patient had right ear surgery that started this morning at 7:00 am and ended at 12:30 pm. They had difficulty waking him from anesthesia, and he continued to be confused after waking. Confusion continued despite reduction in pain medication. He was monitored for most of the day and sent here for evaluation when his confusion and mental status change did not improve. No vomiting. No complications of surgery. No fever.  Extremity Weakness    Past Medical History  Diagnosis Date  . Atrial fibrillation   . Basal cell carcinoma   . Nephrolithiasis    Past Surgical History  Procedure Laterality Date  . Lithotripsy    . Vasectomy     Family History  Problem Relation Age of Onset  . Cancer Mother   . Heart failure Father    History  Substance Use Topics  . Smoking status: Never Smoker   . Smokeless tobacco: Not on file  . Alcohol Use: Yes     Comment: socially on the weekend with a few beers    Review of Systems  Unable to perform ROS: Mental status change  Musculoskeletal: Positive for extremity weakness.      Allergies  Review of patient's allergies indicates no known allergies.  Home Medications   Prior to Admission medications   Medication Sig Start Date End Date Taking? Authorizing Provider  aspirin 81 MG tablet Take 81 mg by mouth daily.    Historical Provider, MD  metoprolol tartrate (LOPRESSOR) 25 MG tablet Take 0.5 tablets (12.5 mg  total) by mouth 2 (two) times daily. 06/24/14   Sueanne Margarita, MD   BP 145/70 mmHg  Pulse 70  Temp(Src) 98.6 F (37 C) (Oral)  Resp 13  SpO2 99% Physical Exam  Constitutional: He appears well-developed and well-nourished. No distress.  HENT:  Head: Atraumatic.  Right ear with post-operative bandage, not removed.  Eyes: Conjunctivae are normal.  Neck: Normal range of motion.  Cardiovascular: Normal rate.   No murmur heard. Pulmonary/Chest: Effort normal. He has no wheezes. He has no rales.  Abdominal: Soft.  Neurological:  The patient does not follow command. He does not answer questions, "Yeah" is only response. There is questionable right sided neglect. Full range of extraocular motion, no fixed gaze, PERRL.  Skin: Skin is warm and dry.    ED Course  Procedures (including critical care time) Labs Review Labs Reviewed  PROTIME-INR  APTT  CBC  DIFFERENTIAL  COMPREHENSIVE METABOLIC PANEL  I-STAT TROPOININ, ED  CBG MONITORING, ED  I-STAT CHEM 8, ED   Results for orders placed or performed during the hospital encounter of 09/24/14  Protime-INR  Result Value Ref Range   Prothrombin Time 15.1 11.6 - 15.2 seconds   INR 1.17 0.00 - 1.49  APTT  Result Value Ref Range   aPTT 26 24 - 37 seconds  CBC  Result Value Ref Range   WBC 13.5 (H) 4.0 - 10.5 K/uL   RBC 4.75 4.22 - 5.81 MIL/uL   Hemoglobin 14.8  13.0 - 17.0 g/dL   HCT 42.2 39.0 - 52.0 %   MCV 88.8 78.0 - 100.0 fL   MCH 31.2 26.0 - 34.0 pg   MCHC 35.1 30.0 - 36.0 g/dL   RDW 12.4 11.5 - 15.5 %   Platelets 231 150 - 400 K/uL  Differential  Result Value Ref Range   Neutrophils Relative % 84 (H) 43 - 77 %   Neutro Abs 11.4 (H) 1.7 - 7.7 K/uL   Lymphocytes Relative 9 (L) 12 - 46 %   Lymphs Abs 1.2 0.7 - 4.0 K/uL   Monocytes Relative 7 3 - 12 %   Monocytes Absolute 0.9 0.1 - 1.0 K/uL   Eosinophils Relative 0 0 - 5 %   Eosinophils Absolute 0.0 0.0 - 0.7 K/uL   Basophils Relative 0 0 - 1 %   Basophils Absolute 0.0  0.0 - 0.1 K/uL  Comprehensive metabolic panel  Result Value Ref Range   Sodium 140 135 - 145 mmol/L   Potassium 4.0 3.5 - 5.1 mmol/L   Chloride 107 101 - 111 mmol/L   CO2 24 22 - 32 mmol/L   Glucose, Bld 115 (H) 65 - 99 mg/dL   BUN 9 6 - 20 mg/dL   Creatinine, Ser 0.96 0.61 - 1.24 mg/dL   Calcium 9.4 8.9 - 10.3 mg/dL   Total Protein 7.3 6.5 - 8.1 g/dL   Albumin 4.2 3.5 - 5.0 g/dL   AST 27 15 - 41 U/L   ALT 27 17 - 63 U/L   Alkaline Phosphatase 61 38 - 126 U/L   Total Bilirubin 1.0 0.3 - 1.2 mg/dL   GFR calc non Af Amer >60 >60 mL/min   GFR calc Af Amer >60 >60 mL/min   Anion gap 9 5 - 15  Urinalysis, Routine w reflex microscopic (not at [facility])  Result Value Ref Range   Color, Urine YELLOW YELLOW   APPearance CLEAR CLEAR   Specific Gravity, Urine 1.007 1.005 - 1.030   pH 7.5 5.0 - 8.0   Glucose, UA NEGATIVE NEGATIVE mg/dL   Hgb urine dipstick SMALL (A) NEGATIVE   Bilirubin Urine NEGATIVE NEGATIVE   Ketones, ur NEGATIVE NEGATIVE mg/dL   Protein, ur NEGATIVE NEGATIVE mg/dL   Urobilinogen, UA 0.2 0.0 - 1.0 mg/dL   Nitrite NEGATIVE NEGATIVE   Leukocytes, UA NEGATIVE NEGATIVE  Urine microscopic-add on  Result Value Ref Range   Squamous Epithelial / LPF RARE RARE   WBC, UA 0-2 <3 WBC/hpf   RBC / HPF 3-6 <3 RBC/hpf   Bacteria, UA RARE RARE  I-stat troponin, ED (not at [facility], [facility])  Result Value Ref Range   Troponin i, poc 0.00 0.00 - 0.08 ng/mL   Comment 3          I-Stat Chem 8, ED  (not at [facility])  Result Value Ref Range   Sodium 142 135 - 145 mmol/L   Potassium 4.0 3.5 - 5.1 mmol/L   Chloride 104 101 - 111 mmol/L   BUN 12 6 - 20 mg/dL   Creatinine, Ser 0.80 0.61 - 1.24 mg/dL   Glucose, Bld 118 (H) 65 - 99 mg/dL   Calcium, Ion 1.11 (L) 1.13 - 1.30 mmol/L   TCO2 21 0 - 100 mmol/L   Hemoglobin 16.0 13.0 - 17.0 g/dL   HCT 47.0 39.0 - 52.0 %   Ct Head Wo Contrast  09/24/2014   CLINICAL DATA:  Confused. Altered mental status. Right leg weakness. Last seen normal  06:50  a.m.  EXAM: CT HEAD WITHOUT CONTRAST  TECHNIQUE: Contiguous axial images were obtained from the base of the skull through the vertex without intravenous contrast.  COMPARISON:  None.  FINDINGS: There is hypodensity consistent with cytotoxic edema in the posterior left temporal lobe and left parietal lobe. There is moderate sulcal effacement and minimal mass effect on the left lateral ventricle. There is no midline shift. Basal cisterns remain patent. There is a hyperdense left MCA. The findings are consistent with an acute nonhemorrhagic infarction. There is no intracranial hemorrhage. There is no extra-axial fluid collection.  Incidental note is made of fluid or soft tissue in the right external ear and middle ear, presumably related to the recent surgery.  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 Mild mass effect with sulcal effacement but no midline shift.   Electronically Signed   By: Andreas Newport M.D.   On: 09/24/2014 23:23    Imaging Review No results found.   EKG Interpretation None      MDM   Final diagnoses:  None    1. Acute left temporal stroke, nonhemorrhagic  Patient presents with AMS, never returned to baseline after surgical procedure earlier today. Arrives agitated, unable to follow command, some right neglect.  Patient's agitation somewhat improved with Ativan, still restless. CT obtained and is showing an acute nonhemorrhagic stroke. Neuro-hospitalist consulted and will see the patient. Hospitalist to admit.   Charlann Lange, PA-C 09/24/14 2352

## 2014-09-24 NOTE — ED Notes (Signed)
Pt returned to Room 7 from CT scan; RN will continue to monitor VS and mental status

## 2014-09-24 NOTE — ED Notes (Signed)
Sherri, PA at bedside.

## 2014-09-24 NOTE — ED Notes (Signed)
Pt presents with [facility] from surgical center of [place] for R tympanplasty this morning at 0730 today thru 1230 today; pt began presenting symptoms of confusion after surgery was completed at 1230p today; pt not following commands, oriented to self only; pt c/o headache and R leg weakness; LSN 09/24/2014 at 0650 when consent was signed by patient

## 2014-09-25 ENCOUNTER — Encounter (HOSPITAL_COMMUNITY): Payer: Self-pay | Admitting: Internal Medicine

## 2014-09-25 ENCOUNTER — Inpatient Hospital Stay (HOSPITAL_COMMUNITY): Payer: BLUE CROSS/BLUE SHIELD

## 2014-09-25 DIAGNOSIS — G9340 Encephalopathy, unspecified: Secondary | ICD-10-CM | POA: Diagnosis present

## 2014-09-25 DIAGNOSIS — I4891 Unspecified atrial fibrillation: Secondary | ICD-10-CM | POA: Diagnosis not present

## 2014-09-25 DIAGNOSIS — I639 Cerebral infarction, unspecified: Secondary | ICD-10-CM | POA: Diagnosis present

## 2014-09-25 DIAGNOSIS — R4701 Aphasia: Secondary | ICD-10-CM | POA: Diagnosis present

## 2014-09-25 DIAGNOSIS — I779 Disorder of arteries and arterioles, unspecified: Secondary | ICD-10-CM | POA: Diagnosis not present

## 2014-09-25 DIAGNOSIS — I6521 Occlusion and stenosis of right carotid artery: Secondary | ICD-10-CM | POA: Diagnosis not present

## 2014-09-25 DIAGNOSIS — I6523 Occlusion and stenosis of bilateral carotid arteries: Secondary | ICD-10-CM | POA: Diagnosis present

## 2014-09-25 DIAGNOSIS — I69320 Aphasia following cerebral infarction: Secondary | ICD-10-CM | POA: Diagnosis not present

## 2014-09-25 DIAGNOSIS — I63512 Cerebral infarction due to unspecified occlusion or stenosis of left middle cerebral artery: Secondary | ICD-10-CM | POA: Diagnosis not present

## 2014-09-25 DIAGNOSIS — I10 Essential (primary) hypertension: Secondary | ICD-10-CM | POA: Diagnosis present

## 2014-09-25 DIAGNOSIS — I480 Paroxysmal atrial fibrillation: Secondary | ICD-10-CM | POA: Diagnosis present

## 2014-09-25 DIAGNOSIS — I6350 Cerebral infarction due to unspecified occlusion or stenosis of unspecified cerebral artery: Secondary | ICD-10-CM | POA: Diagnosis not present

## 2014-09-25 DIAGNOSIS — E785 Hyperlipidemia, unspecified: Secondary | ICD-10-CM | POA: Diagnosis present

## 2014-09-25 DIAGNOSIS — I6931 Cognitive deficits following cerebral infarction: Secondary | ICD-10-CM | POA: Diagnosis not present

## 2014-09-25 DIAGNOSIS — I69390 Apraxia following cerebral infarction: Secondary | ICD-10-CM | POA: Diagnosis not present

## 2014-09-25 DIAGNOSIS — I4819 Other persistent atrial fibrillation: Secondary | ICD-10-CM | POA: Diagnosis present

## 2014-09-25 DIAGNOSIS — Z85828 Personal history of other malignant neoplasm of skin: Secondary | ICD-10-CM | POA: Diagnosis not present

## 2014-09-25 DIAGNOSIS — I63412 Cerebral infarction due to embolism of left middle cerebral artery: Secondary | ICD-10-CM

## 2014-09-25 DIAGNOSIS — I669 Occlusion and stenosis of unspecified cerebral artery: Secondary | ICD-10-CM | POA: Diagnosis not present

## 2014-09-25 DIAGNOSIS — I6522 Occlusion and stenosis of left carotid artery: Secondary | ICD-10-CM | POA: Diagnosis not present

## 2014-09-25 DIAGNOSIS — Z7982 Long term (current) use of aspirin: Secondary | ICD-10-CM | POA: Diagnosis not present

## 2014-09-25 LAB — COMPREHENSIVE METABOLIC PANEL
ALT: 22 U/L (ref 17–63)
AST: 23 U/L (ref 15–41)
Albumin: 3.5 g/dL (ref 3.5–5.0)
Alkaline Phosphatase: 53 U/L (ref 38–126)
Anion gap: 8 (ref 5–15)
BUN: 10 mg/dL (ref 6–20)
CO2: 26 mmol/L (ref 22–32)
Calcium: 8.7 mg/dL — ABNORMAL LOW (ref 8.9–10.3)
Chloride: 105 mmol/L (ref 101–111)
Creatinine, Ser: 0.95 mg/dL (ref 0.61–1.24)
GFR calc Af Amer: >60 mL/min (ref >60)
GFR calc non Af Amer: >60 mL/min (ref >60)
GLUCOSE: 108 mg/dL — AB (ref 65–99)
POTASSIUM: 3.9 mmol/L (ref 3.5–5.1)
Sodium: 139 mmol/L (ref 135–145)
TOTAL PROTEIN: 6.1 g/dL — AB (ref 6.5–8.1)
Total Bilirubin: 0.9 mg/dL (ref 0.3–1.2)

## 2014-09-25 LAB — LIPID PANEL
CHOLESTEROL: 156 mg/dL (ref 0–200)
HDL: 31 mg/dL — ABNORMAL LOW (ref >40)
LDL Cholesterol: 102 mg/dL — ABNORMAL HIGH (ref 0–99)
Total CHOL/HDL Ratio: 5 RATIO
Triglycerides: 117 mg/dL (ref <150)
VLDL: 23 mg/dL (ref 0–40)

## 2014-09-25 LAB — GLUCOSE, CAPILLARY
GLUCOSE-CAPILLARY: 92 mg/dL (ref 65–99)
GLUCOSE-CAPILLARY: 92 mg/dL (ref 65–99)
GLUCOSE-CAPILLARY: 106 mg/dL — AB (ref 65–99)
Glucose-Capillary: 88 mg/dL (ref 65–99)

## 2014-09-25 LAB — CBC WITH DIFFERENTIAL/PLATELET
BASOS ABS: 0 10*3/uL (ref 0.0–0.1)
Basophils Relative: 0 % (ref 0–1)
EOS PCT: 0 % (ref 0–5)
Eosinophils Absolute: 0 10*3/uL (ref 0.0–0.7)
HCT: 40.2 % (ref 39.0–52.0)
Hemoglobin: 13.5 g/dL (ref 13.0–17.0)
Lymphocytes Relative: 14 % (ref 12–46)
Lymphs Abs: 1.9 10*3/uL (ref 0.7–4.0)
MCH: 30.4 pg (ref 26.0–34.0)
MCHC: 33.6 g/dL (ref 30.0–36.0)
MCV: 90.5 fL (ref 78.0–100.0)
Monocytes Absolute: 1.1 10*3/uL — ABNORMAL HIGH (ref 0.1–1.0)
Monocytes Relative: 8 % (ref 3–12)
Neutro Abs: 11 10*3/uL — ABNORMAL HIGH (ref 1.7–7.7)
Neutrophils Relative %: 78 % — ABNORMAL HIGH (ref 43–77)
Platelets: 201 10*3/uL (ref 150–400)
RBC: 4.44 MIL/uL (ref 4.22–5.81)
RDW: 12.7 % (ref 11.5–15.5)
WBC: 14 10*3/uL — ABNORMAL HIGH (ref 4.0–10.5)

## 2014-09-25 LAB — MRSA PCR SCREENING: MRSA by PCR: NEGATIVE

## 2014-09-25 MED ORDER — ENOXAPARIN SODIUM 40 MG/0.4ML ~~LOC~~ SOLN: 40.0000 mg | SUBCUTANEOUS | Status: DC

## 2014-09-25 MED ORDER — ASPIRIN 325 MG PO TABS
325.0000 mg | ORAL_TABLET | Freq: Every day | ORAL | Status: DC
  Administered 2014-09-27: 325 mg via ORAL
  Filled 2014-09-25 (×3): qty 1

## 2014-09-25 MED ORDER — STROKE: EARLY STAGES OF RECOVERY BOOK
Freq: Once | Status: DC
  Filled 2014-09-25 (×2): qty 1

## 2014-09-25 MED ORDER — SODIUM CHLORIDE 0.9 % IV SOLN
INTRAVENOUS | Status: AC
  Administered 2014-09-25 (×2): via INTRAVENOUS

## 2014-09-25 MED ORDER — METOPROLOL TARTRATE 1 MG/ML IV SOLN
2.5000 mg | Freq: Three times a day (TID) | INTRAVENOUS | Status: DC
  Administered 2014-09-25 – 2014-09-26 (×3): 2.5 mg via INTRAVENOUS
  Filled 2014-09-25 (×6): qty 5

## 2014-09-25 MED ORDER — IOHEXOL 350 MG/ML SOLN
50.0000 mL | Freq: Once | INTRAVENOUS | Status: AC | PRN
  Administered 2014-09-25: 50 mL via INTRAVENOUS

## 2014-09-25 MED ORDER — MORPHINE SULFATE 4 MG/ML IJ SOLN
4.0000 mg | Freq: Once | INTRAMUSCULAR | Status: AC
  Administered 2014-09-25: 4 mg via INTRAVENOUS
  Filled 2014-09-25: qty 1

## 2014-09-25 MED ORDER — CEFAZOLIN SODIUM 1-5 GM-% IV SOLN
1.0000 g | Freq: Three times a day (TID) | INTRAVENOUS | Status: DC
  Administered 2014-09-25 – 2014-09-29 (×13): 1 g via INTRAVENOUS
  Filled 2014-09-25 (×17): qty 50

## 2014-09-25 MED ORDER — ASPIRIN 300 MG RE SUPP
300.0000 mg | Freq: Every day | RECTAL | Status: DC
  Administered 2014-09-25 – 2014-09-26 (×2): 300 mg via RECTAL
  Filled 2014-09-25 (×3): qty 1

## 2014-09-25 MED ORDER — ACETAMINOPHEN 650 MG RE SUPP
650.0000 mg | RECTAL | Status: DC | PRN
  Filled 2014-09-25: qty 1

## 2014-09-25 NOTE — ED Notes (Signed)
Pt back from xray to room A7

## 2014-09-25 NOTE — Progress Notes (Signed)
Received call from black box regarding results of patients MRI and Carotid study nurse voicing concerns patients acuity may require transfer to ICU.  Contacted Dr. Maryland Pink notified of patients results and current VSS requested possible transfer to ICU.  Received no new orders at this time.  Will continue to monitor patient.

## 2014-09-25 NOTE — Progress Notes (Addendum)
Bilateral carotid artery duplex completed.  Right:  40-59% ICA stenosis.  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  Bilateral:  Vertebral artery flow is antegrade.

## 2014-09-25 NOTE — Progress Notes (Signed)
Pt. Transferred from ED to 3s02 after report received from [facility] in the ED.  Pt. Has been oriented to unit and call bell within reach.

## 2014-09-25 NOTE — Progress Notes (Signed)
UR COMPLETED  

## 2014-09-25 NOTE — Progress Notes (Signed)
   09/25/14 1400  Clinical Encounter Type  Visited With Patient and family together  Visit Type Initial;Psychological support;Spiritual support  Referral From Nurse  Consult/Referral To Chaplain  Spiritual Encounters  Spiritual Needs Prayer;Emotional  Stress Factors  Patient Stress Factors Major life changes  Family Stress Factors Lack of knowledge;Major life changes  Ch referred by RN to pray and offer support to pt and family; family still uncertain of the diagnosis and would like to fully understand in detail the prognosis for the pt.

## 2014-09-25 NOTE — Consult Note (Signed)
Patient is 1 day s/p right ear canalplasty and tympanoplasty to enlarge the right ear canal and fix a chronic right TM perforation. He also had a left forehead skin lesion removed. Ear dressing removed at bedside. Would recommend continuing antibiotic Ancef 1 gm IV q 8 hrs for 5 days and keep water out of the right ear canal for the next 2 weeks. He's OK for any blood thinner meds from the surgical point of view Will follow up with him while hospitalized for suture removal in approx 1 week

## 2014-09-25 NOTE — H&P (Signed)
[facility] History and Physical  [name] PTW:[id number] DOB: October 31, 1955 DOA: 09/24/2014  Referring physician: Ms. Venida Jarvis. PCP: Gennette Pac, MD  Specialists: Dr. Golden Hurter. Cardiologist.  Chief Complaint: Confusion.  HPI: [name] is a 59 y.o. male with history of paroxysmal atrial fibrillation who has underwent right ear procedure yesterday and following which patient was found to be confused and difficulty communicating verbally. Since the symptoms persisted patient was brought to the ER later in the evening. CT of the head shows subacute stroke. On-call neurologist Dr. Wallie Char was consulted at this time patient has been admitted for further management of acute stroke. On my exam patient is still confused and does not follow commands.  Review of Systems: As presented in the history of presenting illness, rest negative.  Past Medical History  Diagnosis Date  . Atrial fibrillation   . Basal cell carcinoma   . Nephrolithiasis    Past Surgical History  Procedure Laterality Date  . Lithotripsy    . Vasectomy     Social History:  reports that he has never smoked. He does not have any smokeless tobacco history on file. He reports that he drinks alcohol. He reports that he does not use illicit drugs. Where does patient live home. Can patient participate in ADLs? Yes.  No Known Allergies  Family History:  Family History  Problem Relation Age of Onset  . Cancer Mother   . Heart failure Father       Prior to Admission medications   Medication Sig Start Date End Date Taking? Authorizing Provider  aspirin 81 MG tablet Take 81 mg by mouth daily.   Yes Historical Provider, MD  metoprolol tartrate (LOPRESSOR) 25 MG tablet Take 0.5 tablets (12.5 mg total) by mouth 2 (two) times daily. 06/24/14  Yes Sueanne Margarita, MD    Physical Exam: Filed Vitals:   09/24/14 2315 09/25/14 0001 09/25/14 0030 09/25/14 0130  BP: 123/59  117/61 119/69  Pulse:  70 70  65  Temp:    98.5 F (36.9 C)  TempSrc:    Oral  Resp: 20 16 22 13   SpO2: 100% 95%  98%     General:  Moderately built and nourished.  Eyes: Anicteric pallor.  ENT: Right ear has dressing.  Neck: No mass felt and no neck rigidity.  Cardiovascular: S1-S2 heard.  Respiratory: No rhonchi or crepitations.  Abdomen: Soft nontender bowel sounds present.  Skin: No rash.  Musculoskeletal: No edema.  Psychiatric: Patient is confused.  Neurologic: Patient is confused and does not follow commands. Moves all extremities. Perla positive.  Labs on Admission:  Basic Metabolic Panel:  Recent Labs Lab 09/24/14 2227 09/24/14 2235  NA 140 142  K 4.0 4.0  CL 107 104  CO2 24  --   GLUCOSE 115* 118*  BUN 9 12  CREATININE 0.96 0.80  CALCIUM 9.4  --    Liver Function Tests:  Recent Labs Lab 09/24/14 2227  AST 27  ALT 27  ALKPHOS 61  BILITOT 1.0  PROT 7.3  ALBUMIN 4.2   No results for input(s): LIPASE, AMYLASE in the last 168 hours. No results for input(s): AMMONIA in the last 168 hours. CBC:  Recent Labs Lab 09/24/14 2227 09/24/14 2235  WBC 13.5*  --   NEUTROABS 11.4*  --   HGB 14.8 16.0  HCT 42.2 47.0  MCV 88.8  --   PLT 231  --    Cardiac Enzymes: No results for input(s): CKTOTAL, CKMB,  CKMBINDEX, TROPONINI in the last 168 hours.  BNP (last 3 results) No results for input(s): BNP in the last 8760 hours.  ProBNP (last 3 results)  Recent Labs  05/26/14 0751  PROBNP 60.0    CBG: No results for input(s): GLUCAP in the last 168 hours.  Radiological Exams on Admission: Ct Head Wo Contrast  09/24/2014   CLINICAL DATA:  Confused. Altered mental status. Right leg weakness. Last seen normal 06:50 a.m.  EXAM: CT HEAD WITHOUT CONTRAST  TECHNIQUE: Contiguous axial images were obtained from the base of the skull through the vertex without intravenous contrast.  COMPARISON:  None.  FINDINGS: There is hypodensity consistent with cytotoxic edema in the  posterior left temporal lobe and left parietal lobe. There is moderate sulcal effacement and minimal mass effect on the left lateral ventricle. There is no midline shift. Basal cisterns remain patent. There is a hyperdense left MCA. The findings are consistent with an acute nonhemorrhagic infarction. There is no intracranial hemorrhage. There is no extra-axial fluid collection.  Incidental note is made of fluid or soft tissue in the right external ear and middle ear, presumably related to the recent surgery.  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 Mild mass effect with sulcal effacement but no midline shift.   Electronically Signed   By: Andreas Newport M.D.   On: 09/24/2014 23:23    EKG: Independently reviewed. Normal sinus rhythm.  Assessment/Plan Principal Problem:   Stroke Active Problems:   PAF (paroxysmal atrial fibrillation)   1. Stroke - appreciate neurology consult. Risk factor includes atrial fibrillation. MRI/MRA brain 2-D echo carotid Dopplers have been ordered. Speech therapy evaluation for swallow. Physical therapy consult. Closely monitor in telemetry. Aspirin. 2. Paroxysmal atrial fibrillation - presently in sinus rhythm. Patient is on oral metoprolol and patient is confused and encephalopathic I have placed patient on IV metoprolol until patient can reliably take orally. 3. Status post right ear surgery yesterday - please notify patient's ENT surgeon in a.m.  I have reviewed patient's old charts. Reviewed patient's previous cardiology consult neurology consult. Personally reviewed patient's chest x-ray and EKG.   DVT Prophylaxis SCDs. Patient just had surgery yesterday morning. Code Status: Full code.  Family Communication: No family at bedside.  Disposition Plan: Admit to inpatient.    [name] N. [facility] Pager [phone].  If 7PM-7AM, please contact  night-coverage [email or web address] Password [facility] 09/25/2014, 2:59 AM

## 2014-09-25 NOTE — ED Notes (Signed)
Hospitalist at bedside 

## 2014-09-25 NOTE — Progress Notes (Signed)
SLP Cancellation Note  Patient Details Name: KONSTANTINOS CORDOBA MRN: [id number] DOB: 15-Jun-1955   Cancelled treatment:       Reason Eval/Treat Not Completed: Medical issues which prohibited therapy. Per RN pt is not stable for assessment at this time. He failed the RN stroke swallow. Rn will place SLP swallow eval and we will f/u tomorrow and also address cognitive linguistic eval order dated for tomorrow.    Bonnita Newby, Katherene Ponto 09/25/2014, 9:11 AM

## 2014-09-25 NOTE — Progress Notes (Signed)
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 He also had a left forearm skin lesion removed. He was under anesthesia for prolonged duration. There was difficulty in waking him up and he was confused. He was monitored for an extended period of time. Subsequently he was transferred to the emergency department. Evaluation revealed an acute stroke.  Past medical history:  Past Medical History  Diagnosis Date  . Atrial fibrillation   . Basal cell carcinoma   . Nephrolithiasis     Consultants: Neurology  Procedures:  2-D echocardiogram is pending  Carotid Doppler Right: 40-59% ICA stenosis. Left: ICA is occluded. Bilateral: Vertebral artery flow is antegrade.   Antibiotics: Started on Ancef per ENT  Subjective: Patient is unable to communicate.  Objective: Vital Signs  Filed Vitals:   09/25/14 0923 09/25/14 1000 09/25/14 1100 09/25/14 1200  BP: 97/57 100/51 103/57 104/61  Pulse: 58 60 95 74  Temp:      TempSrc:      Resp:  13 17 12   Height:      Weight:      SpO2: 100% 100% 97% 100%    Intake/Output Summary (Last 24 hours) at 09/25/14 1244 Last data filed at 09/25/14 1200  Gross per 24 hour  Intake    500 ml  Output    350 ml  Net    150 ml   Filed Weights   09/25/14 0745  Weight: 92.7 kg (204 lb 5.9 oz)    General appearance: alert, appears stated age, distracted and no distress Head: Normocephalic, without obvious abnormality, atraumatic Resp: clear to auscultation bilaterally Cardio: regular rate and rhythm, S1, S2 normal, no murmur, click, rub or gallop GI: soft, non-tender; bowel sounds normal; no masses,  no  organomegaly Extremities: extremities normal, atraumatic, no cyanosis or edema Neurologic: Some degree of right neglect is noted. Not moving his right upper extremity as much as his left upper extremity. Movement of both lower extremities appear to be equal. No obvious facial droop. He does have significant aphasia.  Lab Results:  Basic Metabolic Panel:  Recent Labs Lab 09/24/14 2227 09/24/14 2235 09/25/14 0400  NA 140 142 139  K 4.0 4.0 3.9  CL 107 104 105  CO2 24  --  26  GLUCOSE 115* 118* 108*  BUN 9 12 10   CREATININE 0.96 0.80 0.95  CALCIUM 9.4  --  8.7*   Liver Function Tests:  Recent Labs Lab 09/24/14 2227 09/25/14 0400  AST 27 23  ALT 27 22  ALKPHOS 61 53  BILITOT 1.0 0.9  PROT 7.3 6.1*  ALBUMIN 4.2 3.5   CBC:  Recent Labs Lab 09/24/14 2227 09/24/14 2235 09/25/14 0400  WBC 13.5*  --  14.0*  NEUTROABS 11.4*  --  11.0*  HGB 14.8 16.0 13.5  HCT 42.2 47.0 40.2  MCV 88.8  --  90.5  PLT 231  --  201    CBG:  Recent Labs Lab 09/25/14 0817 09/25/14 1204  GLUCAP 92 92    Recent Results (from the past 240 hour(s))  MRSA PCR Screening     Status: None   Collection Time: 09/25/14  7:12 AM  Result Value Ref Range Status   MRSA by PCR NEGATIVE  NEGATIVE Final    Comment:        The GeneXpert MRSA Assay (FDA approved for NASAL specimens only), is one component of a comprehensive MRSA colonization surveillance program. It is not intended to diagnose MRSA infection nor to guide or monitor treatment for MRSA infections.       Studies/Results: Dg Chest 2 View  09/25/2014   CLINICAL DATA:  Stroke.  EXAM: CHEST  2 VIEW  COMPARISON:  04/15/2014  FINDINGS: Lower lung volumes from prior exam. Ill-defined right basilar opacity, server atelectasis. Diffuse interstitial thickening. The cardiomediastinal contours are normal. No consolidation, pleural effusion, or pneumothorax. No acute osseous abnormalities are seen.  IMPRESSION: Mild right basilar  atelectasis. Interstitial thickening, this appears chronic.   Electronically Signed   By: Jeb Levering M.D.   On: 09/25/2014 05:09   Ct Head Wo Contrast  09/24/2014   CLINICAL DATA:  Confused. Altered mental status. Right leg weakness. Last seen normal 06:50 a.m.  EXAM: CT HEAD WITHOUT CONTRAST  TECHNIQUE: Contiguous axial images were obtained from the base of the skull through the vertex without intravenous contrast.  COMPARISON:  None.  FINDINGS: There is hypodensity consistent with cytotoxic edema in the posterior left temporal lobe and left parietal lobe. There is moderate sulcal effacement and minimal mass effect on the left lateral ventricle. There is no midline shift. Basal cisterns remain patent. There is a hyperdense left MCA. The findings are consistent with an acute nonhemorrhagic infarction. There is no intracranial hemorrhage. There is no extra-axial fluid collection.  Incidental note is made of fluid or soft tissue in the right external ear and middle ear, presumably related to the recent surgery.  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 Mild mass effect with sulcal effacement but no midline shift.   Electronically Signed   By: Andreas Newport M.D.   On: 09/24/2014 23:23   Mr Brain Wo Contrast  09/25/2014   CLINICAL DATA:  Status post RIGHT ear tympanoplasty yesterday, subsequent altered mental status and difficulty speaking. Follow-up stroke. History of atrial fibrillation.  EXAM: MRI HEAD WITHOUT CONTRAST  MRA HEAD WITHOUT CONTRAST  TECHNIQUE: Multiplanar, multiecho pulse sequences of the brain and surrounding structures were obtained without intravenous contrast. Angiographic images of the head were obtained using MRA technique without contrast.  COMPARISON:  CT head September 24, 2014  FINDINGS: MRI HEAD FINDINGS  Punctate focus of reduced diffusion LEFT frontal lobe. In addition, large wedge-like area of reduced diffusion LEFT  temporal parietal lobe, corresponding low ADC values, mild FLAIR T2 hyperintense signal. No susceptibility artifact to suggest hemorrhagic conversion. Tubular susceptibility artifact LEFT sylvian fissure extending to the cortex consistent with thrombosed distal MCA branch.  Ventricles and sulci are otherwise normal for patient's age. A few scattered subcentimeter supratentorial white matter T2 hyperintensities exclusive of the aforementioned abnormality. No midline shift.  No abnormal extra-axial fluid collections. Ocular globes and orbital contents are unremarkable. Paranasal sinuses are well-aerated. RIGHT middle ear and mastoid effusion, soft tissue opacifies the RIGHT external auditory canal. Small LEFT mastoid effusion. No abnormal sellar expansion. No cerebellar tonsillar ectopia. No suspicious calvarial bone marrow signal.  MRA HEAD FINDINGS  Anterior circulation: Complete loss of flow related enhancement within the included LEFT cervical internal carotid artery to the level of the supraclinoid internal carotid artery. Normal flow related enhancement of the included RIGHT cervical, petrous, cavernous and supra clinoid internal carotid arteries. Patent anterior communicating artery. Normal flow related enhancement of the  bilateral anterior cerebral arteries. Decreased flow related enhancement of LEFT middle cerebral artery, though vessel appears patent. Decreased number of distal LEFT middle cerebral artery branches. Mild luminal irregularity of the LEFT MCA.  No high-grade stenosis, aneurysm.  Posterior circulation: RIGHT vertebral artery is dominant. Basilar artery is patent, with normal flow related enhancement of the main branch vessels. Fetal origin LEFT posterior cerebral artery, small RIGHT posterior communicating artery present. Normal flow related enhancement of the posterior cerebral arteries.  No large vessel occlusion, high-grade stenosis, abnormal luminal irregularity, aneurysm.  IMPRESSION: MRI  HEAD: Acute large LEFT middle cerebral artery territory infarct without hemorrhagic conversion.  Tubular susceptibility artifact LEFT cerebrum most compatible with thromboembolism distal LEFT MCA segment.  Mild white matter changes compatible with chronic small vessel ischemic disease.  MRA HEAD: LEFT internal carotid artery occlusion, recommend angiographic imaging in neck for further assessment.  Slow flow within LEFT middle cerebral artery, retrograde filling via LEFT PCOM. Complete circle of Willis. Mild LEFT MCA luminal irregularity which suggests atherosclerosis.   Electronically Signed   By: Elon Alas M.D.   On: 09/25/2014 06:58   Mr Jodene Nam Head/brain Wo Cm  09/25/2014   CLINICAL DATA:  Status post RIGHT ear tympanoplasty yesterday, subsequent altered mental status and difficulty speaking. Follow-up stroke. History of atrial fibrillation.  EXAM: MRI HEAD WITHOUT CONTRAST  MRA HEAD WITHOUT CONTRAST  TECHNIQUE: Multiplanar, multiecho pulse sequences of the brain and surrounding structures were obtained without intravenous contrast. Angiographic images of the head were obtained using MRA technique without contrast.  COMPARISON:  CT head September 24, 2014  FINDINGS: MRI HEAD FINDINGS  Punctate focus of reduced diffusion LEFT frontal lobe. In addition, large wedge-like area of reduced diffusion LEFT temporal parietal lobe, corresponding low ADC values, mild FLAIR T2 hyperintense signal. No susceptibility artifact to suggest hemorrhagic conversion. Tubular susceptibility artifact LEFT sylvian fissure extending to the cortex consistent with thrombosed distal MCA branch.  Ventricles and sulci are otherwise normal for patient's age. A few scattered subcentimeter supratentorial white matter T2 hyperintensities exclusive of the aforementioned abnormality. No midline shift.  No abnormal extra-axial fluid collections. Ocular globes and orbital contents are unremarkable. Paranasal sinuses are well-aerated. RIGHT middle  ear and mastoid effusion, soft tissue opacifies the RIGHT external auditory canal. Small LEFT mastoid effusion. No abnormal sellar expansion. No cerebellar tonsillar ectopia. No suspicious calvarial bone marrow signal.  MRA HEAD FINDINGS  Anterior circulation: Complete loss of flow related enhancement within the included LEFT cervical internal carotid artery to the level of the supraclinoid internal carotid artery. Normal flow related enhancement of the included RIGHT cervical, petrous, cavernous and supra clinoid internal carotid arteries. Patent anterior communicating artery. Normal flow related enhancement of the bilateral anterior cerebral arteries. Decreased flow related enhancement of LEFT middle cerebral artery, though vessel appears patent. Decreased number of distal LEFT middle cerebral artery branches. Mild luminal irregularity of the LEFT MCA.  No high-grade stenosis, aneurysm.  Posterior circulation: RIGHT vertebral artery is dominant. Basilar artery is patent, with normal flow related enhancement of the main branch vessels. Fetal origin LEFT posterior cerebral artery, small RIGHT posterior communicating artery present. Normal flow related enhancement of the posterior cerebral arteries.  No large vessel occlusion, high-grade stenosis, abnormal luminal irregularity, aneurysm.  IMPRESSION: MRI HEAD: Acute large LEFT middle cerebral artery territory infarct without hemorrhagic conversion.  Tubular susceptibility artifact LEFT cerebrum most compatible with thromboembolism distal LEFT MCA segment.  Mild white matter changes compatible with chronic small vessel ischemic disease.  MRA HEAD:  LEFT internal carotid artery occlusion, recommend angiographic imaging in neck for further assessment.  Slow flow within LEFT middle cerebral artery, retrograde filling via LEFT PCOM. Complete circle of Willis. Mild LEFT MCA luminal irregularity which suggests atherosclerosis.   Electronically Signed   By: Elon Alas  M.D.   On: 09/25/2014 06:58    Medications:  Scheduled: .  stroke: mapping our early stages of recovery book   Does not apply Once  . aspirin  300 mg Rectal Daily   Or  . aspirin  325 mg Oral Daily  .  ceFAZolin (ANCEF) IV  1 g Intravenous 3 times per day  . metoprolol  2.5 mg Intravenous 3 times per day   Continuous: . sodium chloride 100 mL/hr at 09/25/14 0406   PRN:  Assessment/Plan:  Principal Problem:   Stroke Active Problems:   PAF (paroxysmal atrial fibrillation)    Acute stroke involving left MCA territory Stroke workup is in progress. Carotid Doppler suggests occlusion of the left ICA. CT angiogram of the neck to be performed per neurology. Continue aspirin for now. Considering his history of atrial fibrillation he will need anticoagulation. This can be started in a few days' time per neurology. PT and OT to evaluate. Speech therapist to evaluate. Patient is hemodynamically stable. Will eventually need a statin. LDL 102. HbA1c is pending.  Paroxysmal atrial fibrillation Seen by cardiology recently in March. Apparently his initial episode of atrial fibrillation was in the setting of surgery few years ago. He was on anticoagulation, but then apparently stopped it on its own. Did not follow-up with cardiology till recently. He underwent a stress test and a echocardiogram in March. Stress test did not show any ischemia. It appears that an event monitor was utilized, which revealed episodes of paroxysmal A. fib. It does not appear that he has had a chance to follow-up with the cardiologist regarding these findings. Currently, patient is in sinus rhythm but has been noted to be going in and out of atrial fibrillation. He is currently on metoprolol, which we will continue. In view of his stroke, he would need to be on anticoagulation.  Status post right ear procedure 7/28 Discussed with Dr. Lucia Gaskins with ENT. He recommends Ancef for 5 days. This has been initiated. No surgical  contraindication to initiating anticoagulation or aspirin.  DVT Prophylaxis: SCDs    Code Status: Full code  Family Communication: No family at bedside  Disposition Plan: Await stroke workup to be completed. He will likely need some form of rehabilitation at the time of discharge.    LOS: 0 days   [facility] Pager [phone] 09/25/2014, 12:44 PM  If 7PM-7AM, please contact night-coverage at [email or web address], password [email or web address]

## 2014-09-25 NOTE — Consult Note (Signed)
Admission H&P    Chief Complaint: New onset altered mental status with confusion.  HPI: [name] is an 59 y.o. male with a history of atrial fibrillation not on anticoagulation, basal cell carcinoma and nephrolithiasis, was brought to the emergency room for further evaluation after exhibiting prolonged confusion following surgical procedure earlier today. He underwent right tympanoplasty. He was last known well at 7 AM today. At the end of the procedure he was noted to be confused and having difficulty with being able to communicate verbally, as well as follow commands. Change in mental status was initially thought to be related to anesthesia and sedating medications. With no improvement over time he was referred for further evaluation. CT scan of his head showed an acute moderate size left history temporal and parietal ischemic infarction with mild mass effect. He has no history of TIA or stroke. His wife is uncertain of aspirin was discontinued during the week prior to his surgical procedure. NIH stroke score was 9.  LSN: 7 AM on 09/24/2014 tPA Given: No: Moderate-sized stroke demonstrated on CT; be on time window for treatment consideration mRankin:  Past Medical History  Diagnosis Date  . Atrial fibrillation   . Basal cell carcinoma   . Nephrolithiasis     Past Surgical History  Procedure Laterality Date  . Lithotripsy    . Vasectomy      Family History  Problem Relation Age of Onset  . Cancer Mother   . Heart failure Father    Social History:  reports that he has never smoked. He does not have any smokeless tobacco history on file. He reports that he drinks alcohol. He reports that he does not use illicit drugs.  Allergies: No Known Allergies  Medications: Patient's preadmission medications were reviewed by me.  ROS: History obtained from spouse  General ROS: negative for - chills, fatigue, fever, night sweats, weight gain or weight loss Psychological ROS:  negative for - behavioral disorder, hallucinations, memory difficulties, mood swings or suicidal ideation Ophthalmic ROS: negative for - blurry vision, double vision, eye pain or loss of vision ENT ROS: As noted in present illness Allergy and Immunology ROS: negative for - hives or itchy/watery eyes Hematological and Lymphatic ROS: negative for - bleeding problems, bruising or swollen lymph nodes Endocrine ROS: negative for - galactorrhea, hair pattern changes, polydipsia/polyuria or temperature intolerance Respiratory ROS: negative for - cough, hemoptysis, shortness of breath or wheezing Cardiovascular ROS: negative for - chest pain, dyspnea on exertion, edema or irregular heartbeat Gastrointestinal ROS: negative for - abdominal pain, diarrhea, hematemesis, nausea/vomiting or stool incontinence Genito-Urinary ROS: negative for - dysuria, hematuria, incontinence or urinary frequency/urgency Musculoskeletal ROS: negative for - joint swelling or muscular weakness Neurological ROS: as noted in HPI Dermatological ROS: negative for rash and skin lesion changes  Physical Examination: Blood pressure 117/61, pulse 70, temperature 98.6 F (37 C), temperature source Oral, resp. rate 22, SpO2 95 %.  HEENT-  Normocephalic, no lesions, without obvious abnormality.  Normal external eye and conjunctiva.  Normal TM's bilaterally.  Normal auditory canals and external ears. Normal external nose, mucus membranes and septum.  Normal pharynx. Neck supple with no masses, nodes, nodules or enlargement. Cardiovascular - regular rate and rhythm, S1, S2 normal, no murmur, click, rub or gallop Lungs - chest clear, no wheezing, rales, normal symmetric air entry Abdomen - soft, non-tender; bowel sounds normal; no masses,  no organomegaly Extremities - no joint deformities, effusion, or inflammation, no edema and no skin discoloration  Neurologic Examination: Mental Status: Alert, severe receptive aphasia as well as  essentially word salad speech output. Able to follow commands with cues. There was neglect of his right side. Cranial Nerves: II-dense right homonymous hemianopsia. III/IV/VI-Pupils were equal and reacted normally to light. Gaze preference to the left side with no movement of eyes afterwards beyond midline.    VII-moderate right lower facial weakness. VIII-normal. X-mild dysarthria. XI: trapezius strength/neck flexion strength normal bilaterally XII-midline tongue extension with normal strength. Motor: No drift of upper nor lower extremities with equal strength bilaterally in upper and lower extremities; flaccid muscle tone throughout. Sensory: Neglect of right side. Deep Tendon Reflexes: 2+ and symmetric. Plantars: Mute bilaterally Cerebellar: Unable to assess due to language barrier with severe receptive aphasia. Carotid auscultation: Normal  Results for orders placed or performed during the hospital encounter of 09/24/14 (from the past 48 hour(s))  Protime-INR     Status: None   Collection Time: 09/24/14 10:27 PM  Result Value Ref Range   Prothrombin Time 15.1 11.6 - 15.2 seconds   INR 1.17 0.00 - 1.49  APTT     Status: None   Collection Time: 09/24/14 10:27 PM  Result Value Ref Range   aPTT 26 24 - 37 seconds  CBC     Status: Abnormal   Collection Time: 09/24/14 10:27 PM  Result Value Ref Range   WBC 13.5 (H) 4.0 - 10.5 K/uL   RBC 4.75 4.22 - 5.81 MIL/uL   Hemoglobin 14.8 13.0 - 17.0 g/dL   HCT 42.2 39.0 - 52.0 %   MCV 88.8 78.0 - 100.0 fL   MCH 31.2 26.0 - 34.0 pg   MCHC 35.1 30.0 - 36.0 g/dL   RDW 12.4 11.5 - 15.5 %   Platelets 231 150 - 400 K/uL  Differential     Status: Abnormal   Collection Time: 09/24/14 10:27 PM  Result Value Ref Range   Neutrophils Relative % 84 (H) 43 - 77 %   Neutro Abs 11.4 (H) 1.7 - 7.7 K/uL   Lymphocytes Relative 9 (L) 12 - 46 %   Lymphs Abs 1.2 0.7 - 4.0 K/uL   Monocytes Relative 7 3 - 12 %   Monocytes Absolute 0.9 0.1 - 1.0 K/uL    Eosinophils Relative 0 0 - 5 %   Eosinophils Absolute 0.0 0.0 - 0.7 K/uL   Basophils Relative 0 0 - 1 %   Basophils Absolute 0.0 0.0 - 0.1 K/uL  Comprehensive metabolic panel     Status: Abnormal   Collection Time: 09/24/14 10:27 PM  Result Value Ref Range   Sodium 140 135 - 145 mmol/L   Potassium 4.0 3.5 - 5.1 mmol/L   Chloride 107 101 - 111 mmol/L   CO2 24 22 - 32 mmol/L   Glucose, Bld 115 (H) 65 - 99 mg/dL   BUN 9 6 - 20 mg/dL   Creatinine, Ser 0.96 0.61 - 1.24 mg/dL   Calcium 9.4 8.9 - 10.3 mg/dL   Total Protein 7.3 6.5 - 8.1 g/dL   Albumin 4.2 3.5 - 5.0 g/dL   AST 27 15 - 41 U/L   ALT 27 17 - 63 U/L   Alkaline Phosphatase 61 38 - 126 U/L   Total Bilirubin 1.0 0.3 - 1.2 mg/dL   GFR calc non Af Amer >60 >60 mL/min   GFR calc Af Amer >60 >60 mL/min    Comment: (NOTE) The eGFR has been calculated using the CKD EPI equation. This calculation has not  been validated in all clinical situations. eGFR's persistently <60 mL/min signify possible Chronic Kidney Disease.    Anion gap 9 5 - 15  I-stat troponin, ED (not at [facility], [facility])     Status: None   Collection Time: 09/24/14 10:33 PM  Result Value Ref Range   Troponin i, poc 0.00 0.00 - 0.08 ng/mL   Comment 3            Comment: Due to the release kinetics of cTnI, a negative result within the first hours of the onset of symptoms does not rule out myocardial infarction with certainty. If myocardial infarction is still suspected, repeat the test at appropriate intervals.   I-Stat Chem 8, ED  (not at [facility], [facility])     Status: Abnormal   Collection Time: 09/24/14 10:35 PM  Result Value Ref Range   Sodium 142 135 - 145 mmol/L   Potassium 4.0 3.5 - 5.1 mmol/L   Chloride 104 101 - 111 mmol/L   BUN 12 6 - 20 mg/dL   Creatinine, Ser 0.80 0.61 - 1.24 mg/dL   Glucose, Bld 118 (H) 65 - 99 mg/dL   Calcium, Ion 1.11 (L) 1.13 - 1.30 mmol/L   TCO2 21 0 - 100 mmol/L   Hemoglobin 16.0 13.0 - 17.0 g/dL   HCT 47.0 39.0 - 52.0 %  Urinalysis,  Routine w reflex microscopic (not at [facility])     Status: Abnormal   Collection Time: 09/24/14 10:49 PM  Result Value Ref Range   Color, Urine YELLOW YELLOW   APPearance CLEAR CLEAR   Specific Gravity, Urine 1.007 1.005 - 1.030   pH 7.5 5.0 - 8.0   Glucose, UA NEGATIVE NEGATIVE mg/dL   Hgb urine dipstick SMALL (A) NEGATIVE   Bilirubin Urine NEGATIVE NEGATIVE   Ketones, ur NEGATIVE NEGATIVE mg/dL   Protein, ur NEGATIVE NEGATIVE mg/dL   Urobilinogen, UA 0.2 0.0 - 1.0 mg/dL   Nitrite NEGATIVE NEGATIVE   Leukocytes, UA NEGATIVE NEGATIVE  Urine microscopic-add on     Status: None   Collection Time: 09/24/14 10:49 PM  Result Value Ref Range   Squamous Epithelial / LPF RARE RARE   WBC, UA 0-2 <3 WBC/hpf   RBC / HPF 3-6 <3 RBC/hpf   Bacteria, UA RARE RARE   Ct Head Wo Contrast  09/24/2014   CLINICAL DATA:  Confused. Altered mental status. Right leg weakness. Last seen normal 06:50 a.m.  EXAM: CT HEAD WITHOUT CONTRAST  TECHNIQUE: Contiguous axial images were obtained from the base of the skull through the vertex without intravenous contrast.  COMPARISON:  None.  FINDINGS: There is hypodensity consistent with cytotoxic edema in the posterior left temporal lobe and left parietal lobe. There is moderate sulcal effacement and minimal mass effect on the left lateral ventricle. There is no midline shift. Basal cisterns remain patent. There is a hyperdense left MCA. The findings are consistent with an acute nonhemorrhagic infarction. There is no intracranial hemorrhage. There is no extra-axial fluid collection.  Incidental note is made of fluid or soft tissue in the right external ear and middle ear, presumably related to the recent surgery.  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 Mild mass effect with sulcal effacement but no midline shift.   Electronically Signed   By: Andreas Newport M.D.   On: 09/24/2014 23:23    Assessment: 59 y.o. 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  Stroke Risk Factors - atrial fibrillation  Plan: 1. HgbA1c, fasting lipid panel 2. MRI, MRA  of the brain without contrast 3. PT consult, OT consult, Speech consult 4. Echocardiogram 5. Carotid dopplers 6. Prophylactic therapy-Antiplatelet med: Aspirin  7. Risk factor modification 8. Telemetry monitoring  C.R. Nicole Kindred, MD [facility] [phone]  09/25/2014, 1:10 AM

## 2014-09-25 NOTE — ED Notes (Signed)
Patient transported to MRI 

## 2014-09-25 NOTE — Progress Notes (Signed)
Patient has converted into A-fib with a rate in the 80's.  Physician notified will continue to monitor.

## 2014-09-25 NOTE — Care Management Note (Signed)
Case Management Note  Patient Details  Name: BENTLEY FISSEL MRN: [id number] Date of Birth: [birth year]  Subjective/Objective:                 Admitted with CVA, hx of paf , s/p recent ear surgery   Action/Plan: Return to home when medically stable. CM to f/u with d/c needs.  Expected Discharge Date:                  Expected Discharge Plan:  [place]  In-House Referral:     Discharge planning Services  CM Consult  Post Acute Care Choice:    Choice offered to:     DME Arranged:    DME Agency:     HH Arranged:    [place] Agency:     Status of Service:  In process, will continue to follow  Medicare Important Message Given:    Date Medicare IM Given:    Medicare IM give by:    Date Additional Medicare IM Given:    Additional Medicare Important Message give by:     If discussed at [place] of Stay Meetings, dates discussed:    Additional Comments: Revis Whalin (Spouse) [phone]  Sharin Mons, RN 09/25/2014, 11:59 AM

## 2014-09-25 NOTE — Progress Notes (Signed)
STROKE TEAM PROGRESS NOTE   HISTORY [name] is an 59 y.o. male with a history of atrial fibrillation not on anticoagulation, basal cell carcinoma and nephrolithiasis, was brought to the emergency room for further evaluation after exhibiting prolonged confusion following surgical procedure earlier today. He underwent right tympanoplasty. He was last known well at 7 AM today (LKW 09/24/2014 at 0700). At the end of the procedure he was noted to be confused and having difficulty with being able to communicate verbally, as well as follow commands. Change in mental status was initially thought to be related to anesthesia and sedating medications. With no improvement over time he was referred for further evaluation. CT scan of his head showed an acute moderate size left history temporal and parietal ischemic infarction with mild mass effect. He has no history of TIA or stroke. His wife is uncertain of aspirin was discontinued during the week prior to his surgical procedure. NIH stroke score was 9. Patient was not administered TPA secondary to recent surgery, moderate-sized stroke demonstrated on CT and out of the time window for treatment consideration. He was admitted to step down for further evaluation and treatment.   SUBJECTIVE (INTERVAL HISTORY) No family is at the bedside. He remains with expressive and receptive aphasia this am, difficulty following commands. He is alert. His wife [name] arrived during rounds. She is unaware of any history of ICA occlusion or knowledge that his carotids have ever been checked.    OBJECTIVE Temp:  [98.4 F (36.9 C)-98.6 F (37 C)] 98.4 F (36.9 C) (07/29 0706) Pulse Rate:  [58-108] 60 (07/29 1000) Cardiac Rhythm:  [-]  Resp:  [13-22] 13 (07/29 1000) BP: (97-148)/(49-110) 100/51 mmHg (07/29 1000) SpO2:  [95 %-100 %] 100 % (07/29 1000) Weight:  [92.7 kg (204 lb 5.9 oz)] 92.7 kg (204 lb 5.9 oz) (07/29 0745)   Recent Labs Lab 09/25/14 0817  GLUCAP 92     Recent Labs Lab 09/24/14 2227 09/24/14 2235 09/25/14 0400  NA 140 142 139  K 4.0 4.0 3.9  CL 107 104 105  CO2 24  --  26  GLUCOSE 115* 118* 108*  BUN 9 12 10   CREATININE 0.96 0.80 0.95  CALCIUM 9.4  --  8.7*    Recent Labs Lab 09/24/14 2227 09/25/14 0400  AST 27 23  ALT 27 22  ALKPHOS 61 53  BILITOT 1.0 0.9  PROT 7.3 6.1*  ALBUMIN 4.2 3.5    Recent Labs Lab 09/24/14 2227 09/24/14 2235 09/25/14 0400  WBC 13.5*  --  14.0*  NEUTROABS 11.4*  --  11.0*  HGB 14.8 16.0 13.5  HCT 42.2 47.0 40.2  MCV 88.8  --  90.5  PLT 231  --  201   No results for input(s): CKTOTAL, CKMB, CKMBINDEX, TROPONINI in the last 168 hours.  Recent Labs  09/24/14 2227  LABPROT 15.1  INR 1.17    Recent Labs  09/24/14 2249  COLORURINE YELLOW  LABSPEC 1.007  PHURINE 7.5  GLUCOSEU NEGATIVE  HGBUR SMALL*  BILIRUBINUR NEGATIVE  KETONESUR NEGATIVE  PROTEINUR NEGATIVE  UROBILINOGEN 0.2  NITRITE NEGATIVE  LEUKOCYTESUR NEGATIVE       Component Value Date/Time   CHOL 156 09/25/2014 0400   TRIG 117 09/25/2014 0400   HDL 31* 09/25/2014 0400   CHOLHDL 5.0 09/25/2014 0400   VLDL 23 09/25/2014 0400   LDLCALC 102* 09/25/2014 0400   No results found for: HGBA1C No results found for: LABOPIA, [place], [place], [place], [facility], LABBARB  No results for input(s): ETH in the last 168 hours.   Dg Chest 2 View 09/25/2014   Mild right basilar atelectasis. Interstitial thickening, this appears chronic.     Ct Head Wo Contrast 09/24/2014   Acute nonhemorrhagic infarction in the posterior left temporal lobe and left parietal lobe. Mild mass effect with sulcal effacement but no midline shift.     MRI HEAD 09/25/2014     Acute large LEFT middle cerebral artery territory infarct without hemorrhagic conversion.  Tubular susceptibility artifact LEFT cerebrum most compatible with thromboembolism distal LEFT MCA segment.  Mild white matter changes compatible with chronic small vessel ischemic  disease.    MRA HEAD 09/25/2014    LEFT internal carotid artery occlusion, recommend angiographic imaging in neck for further assessment.  Slow flow within LEFT middle cerebral artery, retrograde filling via LEFT PCOM. Complete circle of Willis. Mild LEFT MCA luminal irregularity which suggests atherosclerosis.   atrial fibrillation not on anticoagulation, basal cell carcinoma and nephrolithiasis  Carotid Doppler  Right: 1-39% ICA stenosis. Left: ICA is occluded. Bilateral: Vertebral artery flow is antegrade.     PHYSICAL EXAM Pleasant middle-age Caucasian male currently not in distress. . Afebrile. Head is nontraumatic. Neck is supple without bruit.    Cardiac exam no murmur or gallop. Lungs are clear to auscultation. Distal pulses are well felt. Neurological Exam : Awake alert globally aphasic only with paraphasias. Can follow only midline and occasional one-step commands. Pupils equal reactive. Fundi were not visualized. Extraocular moments are full range. Mild right lower facial weakness. Blinks to threat more on the left compared to the right. Tongue is midline. Motor system exam no upper or lower extremity drift. Symmetric and equal strength in all 4 extremities. Fine finger movements are diminished on the right. Orbits left or right upper extremity. Dependent for some 1+ symmetric. Plantars are downgoing. Sensation appears preserved bilaterally. Gait was not tested.   ASSESSMENT/PLAN [name] is a 59 y.o. male with history of atrial fibrillation not on anticoagulation, basal cell carcinoma and nephrolithiasis who post-R tympanoplasty at [place] who developed difficulty with being able to communicate verbally, as well as difficulty following commands. He did not receive IV t-PA due to recent surgery, beyond the time window.   Stroke:   left MCA infarct in setting of L ICA occlusion and atrial fibrillation. Etiology embolic, source unclear - acute L ICA  occlusion (doubt) vs chronic L ICA occlusion with ? Hypotension during OR yesterday vs  Due to atrial fibrillation. Workup underway.  Resultant  Expressive and receptive aphasia, perseveration, R field cut, decreased R fine motor movement  MRI  L MCA infarct  MRA  L ICA occlusion, L MCA w. Slow fill, fills from L PCOM. R MCA with atherosclerosis  Carotid Doppler  L ICA occlusion   CTA neck to verify total L ICA occlusion  2D Echo  pending   LDL 102  HgbA1c pending  SCDs for VTE prophylaxis Diet NPO time specified  aspirin 81 mg orally every day prior to admission, now on aspirin 300 mg suppository daily  Ongoing aggressive stroke risk factor management  Therapy recommendations:  pending   Disposition:  pending   Atrial Fibrillation  Home anticoagulation:  none   On metoprolol  CHA2DS2-VASc Score = 4 (per guidelines ?2 oral anticoagulation recommended)  Age in Years:  <65   0    Sex:  Male   0   Hypertension History:  yes   +1  Diabetes Mellitus:  0   Congestive Heart Failure History:  0  Vascular Disease History:  yes   +1     Stroke/TIA/Thromboembolism History:  yes   +2  Even though atrial fibrillation may not be the cause of the stroke, recommend anticoagulation once stable from a surgery standpoint and patient able to swallow    Hypertension  Stable  Hyperlipidemia  Home meds:  No statin  LDL 102, goal < 70  Add statin once able to swallow  Continue statin at discharge  Other Stroke Risk Factors  ETOH use  Other Active Problems  R tympanyplasty 09/24/14  Other Pertinent History  Basal cell [facility] day # 0  Radene Journey [facility] [place] for Pager information 09/25/2014 5:07 PM  I have personally examined this patient, reviewed notes, independently viewed imaging studies, participated in medical decision making and plan of care. I have made any additions or clarifications directly to the above note. Agree  with note above. He developed sudden onset of confusion and aphasia following right ear surgery and brain scan shows a left MCA branch infarct with left carotid occlusion. He is at risk for neurological worsening, recurrent stroke, TIA needs ongoing stroke evaluation and aggressive risk factor modification. Check CT angiogram of the brain and neck to look for string sign. I had a long discussion with the patient's wife at the bedside and answered questions. Discussed with Dr. Silvio Clayman, MD Medical Director [place] Pager: [phone] 09/25/2014 6:13 PM    To contact Stroke Continuity provider, please refer to [email or web address] After hours, contact General Neurology

## 2014-09-26 ENCOUNTER — Inpatient Hospital Stay (HOSPITAL_COMMUNITY): Payer: BLUE CROSS/BLUE SHIELD

## 2014-09-26 ENCOUNTER — Other Ambulatory Visit (HOSPITAL_COMMUNITY): Payer: BLUE CROSS/BLUE SHIELD

## 2014-09-26 DIAGNOSIS — I6350 Cerebral infarction due to unspecified occlusion or stenosis of unspecified cerebral artery: Secondary | ICD-10-CM

## 2014-09-26 LAB — CBC
HCT: 41.1 % (ref 39.0–52.0)
Hemoglobin: 13.9 g/dL (ref 13.0–17.0)
MCH: 30.5 pg (ref 26.0–34.0)
MCHC: 33.8 g/dL (ref 30.0–36.0)
MCV: 90.3 fL (ref 78.0–100.0)
Platelets: 189 10*3/uL (ref 150–400)
RBC: 4.55 MIL/uL (ref 4.22–5.81)
RDW: 12.6 % (ref 11.5–15.5)
WBC: 11.1 10*3/uL — ABNORMAL HIGH (ref 4.0–10.5)

## 2014-09-26 LAB — BASIC METABOLIC PANEL
Anion gap: 7 (ref 5–15)
BUN: 10 mg/dL (ref 6–20)
CO2: 24 mmol/L (ref 22–32)
CREATININE: 0.95 mg/dL (ref 0.61–1.24)
Calcium: 8.6 mg/dL — ABNORMAL LOW (ref 8.9–10.3)
Chloride: 107 mmol/L (ref 101–111)
GFR calc non Af Amer: >60 mL/min (ref >60)
Glucose, Bld: 95 mg/dL (ref 65–99)
Potassium: 4 mmol/L (ref 3.5–5.1)
SODIUM: 138 mmol/L (ref 135–145)

## 2014-09-26 LAB — GLUCOSE, CAPILLARY
GLUCOSE-CAPILLARY: 90 mg/dL (ref 65–99)
GLUCOSE-CAPILLARY: 97 mg/dL (ref 65–99)
GLUCOSE-CAPILLARY: 101 mg/dL — AB (ref 65–99)
Glucose-Capillary: 99 mg/dL (ref 65–99)
Glucose-Capillary: 89 mg/dL (ref 65–99)
Glucose-Capillary: 138 mg/dL — ABNORMAL HIGH (ref 65–99)

## 2014-09-26 LAB — HEMOGLOBIN A1C
HEMOGLOBIN A1C: 5.9 % — AB (ref 4.8–5.6)
MEAN PLASMA GLUCOSE: 123 mg/dL

## 2014-09-26 MED ORDER — ONDANSETRON HCL 4 MG/2ML IJ SOLN
4.0000 mg | Freq: Four times a day (QID) | INTRAMUSCULAR | Status: DC | PRN
Start: 2014-09-26 — End: 2014-09-29
  Administered 2014-09-26 – 2014-09-29 (×5): 4 mg via INTRAVENOUS
  Filled 2014-09-26 (×5): qty 2

## 2014-09-26 MED ORDER — ACETAMINOPHEN 325 MG PO TABS
650.0000 mg | ORAL_TABLET | Freq: Four times a day (QID) | ORAL | Status: DC | PRN
  Administered 2014-09-27 – 2014-09-29 (×5): 650 mg via ORAL
  Filled 2014-09-26 (×5): qty 2

## 2014-09-26 MED ORDER — METOPROLOL TARTRATE 1 MG/ML IV SOLN
2.5000 mg | Freq: Four times a day (QID) | INTRAVENOUS | Status: DC
  Administered 2014-09-26 – 2014-09-27 (×3): 2.5 mg via INTRAVENOUS
  Filled 2014-09-26 (×4): qty 5

## 2014-09-26 MED ORDER — ATORVASTATIN CALCIUM 40 MG PO TABS
40.0000 mg | ORAL_TABLET | Freq: Every day | ORAL | Status: DC
  Administered 2014-09-26 – 2014-09-28 (×3): 40 mg via ORAL
  Filled 2014-09-26 (×3): qty 1

## 2014-09-26 MED ORDER — ONDANSETRON HCL 4 MG/2ML IJ SOLN
INTRAMUSCULAR | Status: AC
  Administered 2014-09-26: 4 mg
  Filled 2014-09-26: qty 2

## 2014-09-26 MED ORDER — SODIUM CHLORIDE 0.9 % IV SOLN
INTRAVENOUS | Status: AC
  Administered 2014-09-26 (×2): via INTRAVENOUS

## 2014-09-26 NOTE — Progress Notes (Addendum)
STROKE TEAM PROGRESS NOTE   HISTORY [name] is an 59 y.o. male with a history of atrial fibrillation not on anticoagulation, basal cell carcinoma and nephrolithiasis, was brought to the emergency room for further evaluation after exhibiting prolonged confusion following surgical procedure earlier today. He underwent right tympanoplasty. He was last known well at 7 AM today. At the end of the procedure he was noted to be confused and having difficulty with being able to communicate verbally, as well as follow commands. Change in mental status was initially thought to be related to anesthesia and sedating medications. With no improvement over time he was referred for further evaluation. CT scan of his head showed an acute moderate size left history temporal and parietal ischemic infarction with mild mass effect. He has no history of TIA or stroke. His wife is uncertain of aspirin was discontinued during the week prior to his surgical procedure. NIH stroke score was 9.  LSN: 7 AM on 09/24/2014 tPA Given: No: Moderate-sized stroke demonstrated on CT; be on time window for treatment consideration mRankin:    SUBJECTIVE (INTERVAL HISTORY) His wife and son were at the bedside.  Overall he cannot express how feels his condition is due to aphasia    OBJECTIVE Temp:  [97.6 F (36.4 C)-100.5 F (38.1 C)] 98.7 F (37.1 C) (07/30 1006) Pulse Rate:  [51-101] 87 (07/30 1006) Cardiac Rhythm:  [-] Atrial fibrillation;Normal sinus rhythm (07/30 0745) Resp:  [11-18] 18 (07/30 1006) BP: (109-129)/(57-79) 129/79 mmHg (07/30 1006) SpO2:  [94 %-99 %] 98 % (07/30 1006)   Recent Labs Lab 09/25/14 1920 09/25/14 2327 09/26/14 0333 09/26/14 0809 09/26/14 1131  GLUCAP 88 90 97 101* 99    Recent Labs Lab 09/24/14 2227 09/24/14 2235 09/25/14 0400 09/26/14 0221  NA 140 142 139 138  K 4.0 4.0 3.9 4.0  CL 107 104 105 107  CO2 24  --  26 24  GLUCOSE 115* 118* 108* 95  BUN 9 12 10 10    CREATININE 0.96 0.80 0.95 0.95  CALCIUM 9.4  --  8.7* 8.6*    Recent Labs Lab 09/24/14 2227 09/25/14 0400  AST 27 23  ALT 27 22  ALKPHOS 61 53  BILITOT 1.0 0.9  PROT 7.3 6.1*  ALBUMIN 4.2 3.5    Recent Labs Lab 09/24/14 2227 09/24/14 2235 09/25/14 0400 09/26/14 0221  WBC 13.5*  --  14.0* 11.1*  NEUTROABS 11.4*  --  11.0*  --   HGB 14.8 16.0 13.5 13.9  HCT 42.2 47.0 40.2 41.1  MCV 88.8  --  90.5 90.3  PLT 231  --  201 189   No results for input(s): CKTOTAL, CKMB, CKMBINDEX, TROPONINI in the last 168 hours.  Recent Labs  09/24/14 2227  LABPROT 15.1  INR 1.17    Recent Labs  09/24/14 2249  COLORURINE YELLOW  LABSPEC 1.007  PHURINE 7.5  GLUCOSEU NEGATIVE  HGBUR SMALL*  BILIRUBINUR NEGATIVE  KETONESUR NEGATIVE  PROTEINUR NEGATIVE  UROBILINOGEN 0.2  NITRITE NEGATIVE  LEUKOCYTESUR NEGATIVE       Component Value Date/Time   CHOL 156 09/25/2014 0400   TRIG 117 09/25/2014 0400   HDL 31* 09/25/2014 0400   CHOLHDL 5.0 09/25/2014 0400   VLDL 23 09/25/2014 0400   LDLCALC 102* 09/25/2014 0400   Lab Results  Component Value Date   HGBA1C 5.9* 09/25/2014   No results found for: LABOPIA, COCAINSCRNUR, LABBENZ, AMPHETMU, THCU, LABBARB  No results for input(s): ETH in the last 168 hours.  Imaging   Dg Chest 2 View  09/25/2014    Mild right basilar atelectasis. Interstitial thickening, this appears chronic.       Ct Head Wo Contrast 09/24/2014    Acute nonhemorrhagic infarction in the posterior left temporal lobe and left parietal lobe. Mild mass effect with sulcal effacement but no midline shift.     Ct Angio Neck W/cm &/or Wo/cm 09/25/2014    1. 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  2. The right internal carotid artery is narrowed to 1.9 mm. This represents the 65% stenosis relative to the more distal vessel at 5.5 mm.  3. The vertebral arteries are intact bilaterally.  4. Degenerative changes of the cervical spine are  most evident at C5-6 with mild osseous foraminal narrowing bilaterally.       Mr Brain Wo Contrast 09/25/2014     MRI HEAD:  Acute large LEFT middle cerebral artery territory infarct without hemorrhagic conversion.  Tubular susceptibility artifact LEFT cerebrum most compatible with thromboembolism distal LEFT MCA segment.  Mild white matter changes compatible with chronic small vessel ischemic disease.    MRA HEAD:  LEFT internal carotid artery occlusion, recommend angiographic imaging in neck for further assessment.  Slow flow within LEFT middle cerebral artery, retrograde filling via LEFT PCOM. Complete circle of Willis. Mild LEFT MCA luminal irregularity which suggests atherosclerosis.       PHYSICAL EXAM HEENT- Normocephalic, no lesions, without obvious abnormality. Normal external eye and conjunctiva. Normal TM's bilaterally. Normal auditory canals and external ears. Normal external nose, mucus membranes and septum. Normal pharynx. Neck supple with no masses, nodes, nodules or enlargement. Cardiovascular - regular rate and rhythm, S1, S2 normal, no murmur, click, rub or gallop Lungs - chest clear, no wheezing, rales, normal symmetric air entry Abdomen - soft, non-tender; bowel sounds normal; no masses, no organomegaly Extremities - no joint deformities, effusion, or inflammation, no edema and no skin discoloration  Neurologic Examination: Mental Status: Alert, severe receptive aphasia as well as essentially word salad speech output. Able to follow commands with cues. There was neglect of his right side. Cranial Nerves: II-dense right homonymous hemianopsia. III/IV/VI-Pupils were equal and reacted normally to light. Gaze preference to the left side with no movement of eyes afterwards beyond midline.  VII-moderate right lower facial weakness. VIII-normal. X-mild dysarthria. XI: trapezius strength/neck flexion strength normal bilaterally XII-midline tongue extension with  normal strength. Motor: No drift of upper nor lower extremities with equal strength bilaterally in upper and lower extremities; flaccid muscle tone throughout. Sensory: Neglect of right side. Deep Tendon Reflexes: 2+ and symmetric. Plantars: Mute bilaterally Cerebellar: Unable to assess due to language barrier with severe receptive aphasia. Carotid auscultation: Normal   ASSESSMENT/PLAN [name] is a 59 y.o. male with history of atrial fibrillation not on anticoagulation, basal cell carcinoma, and nephrolithiasis presenting with prolonged confusion with difficulty communicating and following commands. He did not receive IV t-PA due to late presentation and moderate sized stroke.  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  Resultant  Global aphasia  MRI  Acute large LEFT middle cerebral artery territory infarct.  MRA  LEFT internal carotid artery occlusion,   CTA - as above  Carotid Doppler  see CTA of neck  2D Echo pending  LDL 102  HgbA1c 5.9  SCDs for VTE prophylaxis  Diet Heart Room service appropriate?: Yes; Fluid consistency:: Thin  aspirin 81 mg orally  every day prior to admission, now on aspirin 325 mg orally every day  Patient counseled to be compliant with his antithrombotic medications  Ongoing aggressive stroke risk factor management  Therapy recommendations:  Inpatient rehabilitation recommended - rehabilitation M.D. consult pending  Disposition: Pending  Hypertension  Home meds: Toprol 25 mg twice daily (no doubt also used for atrial fibrillation rate control)  Blood pressure mildly low   Hyperlipidemia  Home meds: No lipid lowering medications prior to admission  LDL 102, goal < 70  Now on Lipitor 40 mg daily.  Continue statin at discharge  Other Stroke Risk Factors  Advanced age  ETOH use   Atrial fibrillation  Hospital day # Learned PA-C [facility] Pager [phone] 09/26/2014, 1:52 PM   ATTENDING NOTE: Patient was seen and examined by me personally. I reviewed notes, independently viewed imaging studies, participated in medical decision making and plan of care. I have made additions or clarifications directly to the above note.  Documentation accurately reflects findings. The laboratory and radiographic studies were personally reviewed by me.  ROS could not be fully documented due to aphasia  Assessment and plan completed by me personally and fully documented above.  Plans include:    Neuro  Consider anticoagulation for atrial fibrillation when safe from a stroke standpoint.  Left internal carotid artery occlusion with significant stenosis of the right internal carotid artery  Patient needs a vascular surgery consult for outpatient follow-up of 65% asymptomatic side  LDL 102 - Lipitor 40 mg daily initiated  Cardiac  2-D echocardiogram pending  Atrial fibrillation - controlled ventricular response  Pulmonary  Mild right basilar atelectasis on chest x-ray - no tobacco history - afebrile  GI  Heart healthy diet  GU   Stable  Endo  Hemoglobin A1c 5.9  Heme / ID  Mild leukocytosis  Ancef following recent ENT surgery  Right ear canalplasty and tympanoplasty to enlarge the right ear canal and 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  Will continue to follow.  SIGNED BY: Dr. Elissa Hefty        To contact Stroke Continuity provider, please refer to [email or web address] After hours, contact General Neurology

## 2014-09-26 NOTE — Progress Notes (Signed)
Patient to transfer to 4N32 report given to receiving nurse, all questions answered at this time.  Pt. VSS with no s/s of distress noted.  Patient stable at transfer.

## 2014-09-26 NOTE — Evaluation (Signed)
Speech Language Pathology Evaluation Patient Details Name: [name] MRN: [id number] DOB: 1955-12-13 Today's Date: 09/26/2014 Time: 0109-3235 SLP Time Calculation (min) (ACUTE ONLY): 25 min  Problem List:  Patient Active Problem List   Diagnosis Date Noted  . CVA (cerebral infarction) 09/25/2014  . Stroke 09/25/2014  . PAF (paroxysmal atrial fibrillation) 09/25/2014  . Acute CVA (cerebrovascular accident)   . SOB (shortness of breath) 05/19/2014  . Heart palpitations 05/19/2014  . Atrial fibrillation    Past Medical History:  Past Medical History  Diagnosis Date  . Atrial fibrillation   . Basal cell carcinoma   . Nephrolithiasis    Past Surgical History:  Past Surgical History  Procedure Laterality Date  . Lithotripsy    . Vasectomy     HPI:  Pt is a 59 y.o. male with PMH paroxysmal A.fib who underwent ear procedure on 7/28, following which pt was found to be confused and with difficulty communicating verbally which was initially thought to be related to anesthesia and sedating meds but then showed no improvement. MRI 7/29 showed large acute L MCA infarct. CXR showed mild R basilar atelectasis which appeared chronic. Failed RN stroke swallow screen- not following commands, did not cough on command. Bedside swallow eval ordered to further assess swallow function and SLE ordered as part of stroke workup.   Assessment / Plan / Recommendation Clinical Impression  Pt currently demonstrating a severe but fluent receptive/ expressive aphasia. Speech primarily consists of perseverative utterances with phonemic paraphasias- initially repeating "do it"; later repeating "I don't know how it happened". Was able to repeat name with articulatory cues. Able to follow 1-step commands ~25% of the time; most of the time pt just repeated a phrase rather than attempting the direction. Pt appears aware of expressive deficits, occasionally shaking head after attempts. Attempted use of  communication board- after multiple attempts demonstrating use, pt showed no attempt to use it and did not appear to understand its purpose. Difficult to fully assess cognition; pt was able to maintain attention to task without difficulty. No motor speech deficits noted. Pt needs continued SLP services to assist with pt getting wants and needs met and identifying an effective means of communication. Pt will also benefit from continued rehab services at next level of care. Will continue to follow.    SLP Assessment  Patient needs continued Speech Lanaguage Pathology Services    Follow Up Recommendations  Inpatient Rehab    Frequency and Duration min 2x/week  2 weeks   Pertinent Vitals/Pain Pain Assessment: Faces Faces Pain Scale: No hurt   SLP Goals  Potential to Achieve Goals (ACUTE ONLY): Good Potential Considerations (ACUTE ONLY): Severity of impairments  SLP Evaluation Prior Functioning  Cognitive/Linguistic Baseline: Within functional limits   Cognition  Overall Cognitive Status: Difficult to assess (severe aphasia) Arousal/Alertness: Awake/alert Orientation Level: Oriented to person;Other (comment) (difficult to further assess d/t aphasia) Attention: Selective Selective Attention: Appears intact Awareness:  (aware of language deficits- frustrated)    Comprehension  Auditory Comprehension Overall Auditory Comprehension: Impaired Yes/No Questions: Impaired Basic Biographical Questions: 51-75% accurate Commands: Impaired One Step Basic Commands: 25-49% accurate Conversation: Simple Reading Comprehension Reading Status: Not tested    Expression Expression Primary Mode of Expression: Verbal Verbal Expression Overall Verbal Expression: Impaired Initiation: No impairment Automatic Speech: Name Level of Generative/Spontaneous Verbalization: Phrase (speech consists of mostly perseverative irrelevant utterance) Repetition: Impaired Level of Impairment: Word level Naming:  Impairment Confrontation: Impaired Convergent: 0-24% accurate Verbal Errors: Phonemic paraphasias;Perseveration;Aware of errors Pragmatics:  No impairment Effective Techniques: Phonemic cues;Articulatory cues Non-Verbal Means of Communication: Other (comment) (attempted communication board- difficult for pt to use) Written Expression Written Expression: Not tested   Oral / Motor Oral Motor/Sensory Function Overall Oral Motor/Sensory Function: Impaired (difficult to fully assess) Labial ROM: Reduced right (mild droop) Labial Symmetry: Abnormal symmetry right Motor Speech Overall Motor Speech: Appears within functional limits for tasks assessed   GO     Kern Reap, MA, CCC-SLP 09/26/2014, 9:27 AM

## 2014-09-26 NOTE — Evaluation (Signed)
Physical Therapy Evaluation Patient Details Name: [name] MRN: [id number] DOB: June 09, 1955 Today's Date: 09/26/2014   History of Present Illness  Adm 7/28 with Lt MCA CVA; found to have Lt ICA occlusion and Rt ICA 65% stenosis PMHx-PAF, Rt ear tympanoplasty surgery 09/23/14 (awoke from surgery with CVA symptoms)  Clinical Impression  Pt admitted with above diagnosis. Pt currently with functional limitations due to the deficits listed below (see PT Problem List). Impaired sensation, likely impaired vision, and decr communication all combine to cause overall decr function. Pt will benefit from skilled PT to increase their independence and safety with mobility to allow discharge to the venue listed below.       Follow Up Recommendations CIR    Equipment Recommendations   (TBA)    Recommendations for Other Services Rehab consult     Precautions / Restrictions Precautions Precautions: Fall      Mobility  Bed Mobility Overal bed mobility: Needs Assistance Bed Mobility: Supine to Sit     Supine to sit: Min assist     General bed mobility comments: pt went to use RUE to stabilize himself on railing and hand slipped off rail with + LOB   Transfers Overall transfer level: Needs assistance Equipment used: None Transfers: Sit to/from Stand Sit to Stand: Min assist         General transfer comment: for balance; x 2  Ambulation/Gait Ambulation/Gait assistance: Min assist Ambulation Distance (Feet): 4 Feet Assistive device: None Gait Pattern/deviations: Step-through pattern;Decreased stride length;Drifts right/left (decr placement/coordination RLE)   Gait velocity interpretation: Below normal speed for age/gender General Gait Details: der balance due to poor placement RLE  Stairs            Wheelchair Mobility    Modified Rankin (Stroke Patients Only) Modified Rankin (Stroke Patients Only) Pre-Morbid Rankin Score: No symptoms Modified Rankin: Moderately  severe disability     Balance Overall balance assessment: Needs assistance Sitting-balance support: No upper extremity supported;Feet supported Sitting balance-Leahy Scale: Fair     Standing balance support: No upper extremity supported Standing balance-Leahy Scale: Poor                               Pertinent Vitals/Pain Pain Assessment: Faces Faces Pain Scale: Hurts a little bit Pain Location: ? headache (holding head at times) Pain Intervention(s): Limited activity within patient's tolerance;Repositioned (RN notified and has paged MD for orders)    Home Living Family/patient expects to be discharged to:: Private residence Living Arrangements: Spouse/significant other   Type of Home: House Home Access: Stairs to enter Entrance Stairs-Rails: Right Entrance Stairs-Number of Steps: 2   Home Equipment: None      Prior Function Level of Independence: Independent         Comments: works for [employer]; plays [employer]   Dominant Hand: Right    Extremity/Trunk Assessment   Upper Extremity Assessment: Defer to OT evaluation           Lower Extremity Assessment: RLE deficits/detail RLE Deficits / Details: difficult to manual muscle test due to language impairments/decr following commands; grossly >4/5 throughout    Cervical / Trunk Assessment: Normal  Communication   Communication: Receptive difficulties;Expressive difficulties  Cognition Arousal/Alertness: Awake/alert Behavior During Therapy: Restless Overall Cognitive Status: Difficult to assess                      General  Comments General comments (skin integrity, edema, etc.): Wife and son present throughout    Exercises        Assessment/Plan    PT Assessment Patient needs continued PT services  PT Diagnosis Hemiplegia dominant side   PT Problem List Decreased strength;Decreased activity tolerance;Decreased balance;Decreased mobility;Decreased  coordination;Decreased knowledge of use of DME;Decreased safety awareness;Impaired sensation  PT Treatment Interventions DME instruction;Gait training;Stair training;Functional mobility training;Therapeutic activities;Balance training;Neuromuscular re-education;Cognitive remediation;Patient/family education   PT Goals (Current goals can be found in the Care Plan section) Acute Rehab PT Goals Patient Stated Goal: unable to state PT Goal Formulation: With family Time For Goal Achievement: 10/03/14 Potential to Achieve Goals: Good    Frequency Min 4X/week   Barriers to discharge        Co-evaluation               End of Session Equipment Utilized During Treatment: Gait belt Activity Tolerance: Other (comment) (?headache ?nausea) Patient left: in chair;with call bell/phone within reach;with chair alarm set;with family/visitor present (family instructed to contact RN if leaving pt alone) Nurse Communication: Mobility status (recommend +2 for ambulation)         Time: 3382-5053 PT Time Calculation (min) (ACUTE ONLY): 32 min   Charges:   PT Evaluation $Initial PT Evaluation Tier I: 1 Procedure PT Treatments $Therapeutic Activity: 8-22 mins   PT G Codes:        Sahiti Joswick October 06, 2014, 12:09 PM Pager [phone]

## 2014-09-26 NOTE — Progress Notes (Signed)
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 He also had a left forearm skin lesion removed. He was under anesthesia for prolonged duration. There was difficulty in waking him up and he was confused. He was monitored for an extended period of time. Subsequently he was transferred to the emergency department. Evaluation revealed an acute stroke.  Past medical history:  Past Medical History  Diagnosis Date  . Atrial fibrillation   . Basal cell carcinoma   . Nephrolithiasis     Consultants: Neurology  Procedures:  2-D echocardiogram is pending  Carotid Doppler Right: 40-59% ICA stenosis. Left: ICA is occluded. Bilateral: Vertebral artery flow is antegrade.   Antibiotics: Started on Ancef 7/29 per ENT  Subjective: Patient speaking more words, but unable to communicate appropriately.   Objective: Vital Signs  Filed Vitals:   09/25/14 1921 09/26/14 0000 09/26/14 0335 09/26/14 0400  BP: 117/66 123/63  129/78  Pulse: 61 101  96  Temp: 98.6 F (37 C) 98.3 F (36.8 C) 98 F (36.7 C) 98 F (36.7 C)  TempSrc: Axillary Oral Oral Oral  Resp: 13 15  14   Height:      Weight:      SpO2: 99% 94%  97%    Intake/Output Summary (Last 24 hours) at 09/26/14 0754 Last data filed at 09/26/14 1660  Gross per 24 hour  Intake   1700 ml  Output   1600 ml  Net    100 ml   Filed Weights   09/25/14 0745  Weight: 92.7 kg (204 lb 5.9 oz)    General appearance: alert, appears stated age, distracted and no distress Resp: clear to auscultation bilaterally Cardio: regular rate and rhythm, S1, S2 normal, no murmur, click, rub or gallop GI: soft, non-tender;  bowel sounds normal; no masses,  no organomegaly Extremities: extremities normal, atraumatic, no cyanosis or edema Neurologic: Some degree of right neglect is noted. Noted to be moving his right upper extremity today. Not following commands. Moving both his lower extremities. No obvious facial droop. He does have significant aphasia.  Lab Results:  Basic Metabolic Panel:  Recent Labs Lab 09/24/14 2227 09/24/14 2235 09/25/14 0400 09/26/14 0221  NA 140 142 139 138  K 4.0 4.0 3.9 4.0  CL 107 104 105 107  CO2 24  --  26 24  GLUCOSE 115* 118* 108* 95  BUN 9 12 10 10   CREATININE 0.96 0.80 0.95 0.95  CALCIUM 9.4  --  8.7* 8.6*   Liver Function Tests:  Recent Labs Lab 09/24/14 2227 09/25/14 0400  AST 27 23  ALT 27 22  ALKPHOS 61 53  BILITOT 1.0 0.9  PROT 7.3 6.1*  ALBUMIN 4.2 3.5   CBC:  Recent Labs Lab 09/24/14 2227 09/24/14 2235 09/25/14 0400 09/26/14 0221  WBC 13.5*  --  14.0* 11.1*  NEUTROABS 11.4*  --  11.0*  --   HGB 14.8 16.0 13.5 13.9  HCT 42.2 47.0 40.2 41.1  MCV 88.8  --  90.5 90.3  PLT 231  --  201 189    CBG:  Recent Labs Lab 09/25/14 1204 09/25/14 1618 09/25/14 1920 09/25/14 2327 09/26/14 0333  GLUCAP 92 106* 88 90 97    Recent Results (from  the past 240 hour(s))  MRSA PCR Screening     Status: None   Collection Time: 09/25/14  7:12 AM  Result Value Ref Range Status   MRSA by PCR NEGATIVE NEGATIVE Final    Comment:        The GeneXpert MRSA Assay (FDA approved for NASAL specimens only), is one component of a comprehensive MRSA colonization surveillance program. It is not intended to diagnose MRSA infection nor to guide or monitor treatment for MRSA infections.       Studies/Results: Dg Chest 2 View  09/25/2014   CLINICAL DATA:  Stroke.  EXAM: CHEST  2 VIEW  COMPARISON:  04/15/2014  FINDINGS: Lower lung volumes from prior exam. Ill-defined right basilar opacity, server atelectasis. Diffuse interstitial thickening. The  cardiomediastinal contours are normal. No consolidation, pleural effusion, or pneumothorax. No acute osseous abnormalities are seen.  IMPRESSION: Mild right basilar atelectasis. Interstitial thickening, this appears chronic.   Electronically Signed   By: Jeb Levering M.D.   On: 09/25/2014 05:09   Ct Head Wo Contrast  09/24/2014   CLINICAL DATA:  Confused. Altered mental status. Right leg weakness. Last seen normal 06:50 a.m.  EXAM: CT HEAD WITHOUT CONTRAST  TECHNIQUE: Contiguous axial images were obtained from the base of the skull through the vertex without intravenous contrast.  COMPARISON:  None.  FINDINGS: There is hypodensity consistent with cytotoxic edema in the posterior left temporal lobe and left parietal lobe. There is moderate sulcal effacement and minimal mass effect on the left lateral ventricle. There is no midline shift. Basal cisterns remain patent. There is a hyperdense left MCA. The findings are consistent with an acute nonhemorrhagic infarction. There is no intracranial hemorrhage. There is no extra-axial fluid collection.  Incidental note is made of fluid or soft tissue in the right external ear and middle ear, presumably related to the recent surgery.  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 Mild mass effect with sulcal effacement but no midline shift.   Electronically Signed   By: Andreas Newport M.D.   On: 09/24/2014 23:23   Ct Angio Neck W/cm &/or Wo/cm  09/25/2014   CLINICAL DATA:  Expressive and receptive a aphasia. Left ICA occlusion.  EXAM: CT ANGIOGRAPHY NECK  TECHNIQUE: Multidetector CT imaging of the neck was performed using the standard protocol during bolus administration of intravenous contrast. Multiplanar CT image reconstructions and MIPs were obtained to evaluate the vascular anatomy. Carotid stenosis measurements (when applicable) are obtained utilizing NASCET criteria, using the distal internal carotid  diameter as the denominator.  CONTRAST:  22mL OMNIPAQUE IOHEXOL 350 MG/ML SOLN  COMPARISON:  MRI brain and MRA head from the same day.  FINDINGS: Right carotid system: The visualized right common carotid artery is within normal limits. Extensive plaque is present in the proximal right internal carotid artery. Minimal luminal diameter is 1.9 mm. This compares with a more distal lumen of 5.5 mm. No other significant stenoses are present within the right ICA.  Left carotid system: The visualize left common carotid artery is within normal limits. 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 The PCA branch vessels are visualized. There is no reconstitution of the vessel in the neck.  Vertebral arteries:The vertebral arteries both originate from the subclavian arteries. The vertebral artery origins are visualized and normal. There is no significant stenosis or vascular injury to the vertebral arteries in the neck. The right vertebral artery is slightly dominant to the left.  The PICA origins are visualized bilaterally. The vertebrobasilar junction is intact.  Skeleton: Mild endplate degenerative change in uncovertebral spurring is present at C5-6 bilaterally with osseous foraminal narrowing is slightly worse a left. No focal lytic or blastic lesions are present. Fluid is present in the inferior right mastoid air cells. There is fluid or soft tissue throughout the right external auditory canal and the right middle ear cavity is well.  Other neck: Limited imaging the brain is unremarkable. The globes and orbits are intact. Salivary glands are within normal limits. No focal mucosal lesions are present. The thyroid is unremarkable. The lung apices are clear.  IMPRESSION: 1. 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 2. The right internal carotid artery is narrowed to 1.9 mm. This represents the 65% stenosis relative to the more distal vessel at 5.5 mm. 3. The vertebral arteries are  intact bilaterally. 4. Degenerative changes of the cervical spine are most evident at C5-6 with mild osseous foraminal narrowing bilaterally.   Electronically Signed   By: San Morelle M.D.   On: 09/25/2014 18:36   Mr Brain Wo Contrast  09/25/2014   CLINICAL DATA:  Status post RIGHT ear tympanoplasty yesterday, subsequent altered mental status and difficulty speaking. Follow-up stroke. History of atrial fibrillation.  EXAM: MRI HEAD WITHOUT CONTRAST  MRA HEAD WITHOUT CONTRAST  TECHNIQUE: Multiplanar, multiecho pulse sequences of the brain and surrounding structures were obtained without intravenous contrast. Angiographic images of the head were obtained using MRA technique without contrast.  COMPARISON:  CT head September 24, 2014  FINDINGS: MRI HEAD FINDINGS  Punctate focus of reduced diffusion LEFT frontal lobe. In addition, large wedge-like area of reduced diffusion LEFT temporal parietal lobe, corresponding low ADC values, mild FLAIR T2 hyperintense signal. No susceptibility artifact to suggest hemorrhagic conversion. Tubular susceptibility artifact LEFT sylvian fissure extending to the cortex consistent with thrombosed distal MCA branch.  Ventricles and sulci are otherwise normal for patient's age. A few scattered subcentimeter supratentorial white matter T2 hyperintensities exclusive of the aforementioned abnormality. No midline shift.  No abnormal extra-axial fluid collections. Ocular globes and orbital contents are unremarkable. Paranasal sinuses are well-aerated. RIGHT middle ear and mastoid effusion, soft tissue opacifies the RIGHT external auditory canal. Small LEFT mastoid effusion. No abnormal sellar expansion. No cerebellar tonsillar ectopia. No suspicious calvarial bone marrow signal.  MRA HEAD FINDINGS  Anterior circulation: Complete loss of flow related enhancement within the included LEFT cervical internal carotid artery to the level of the supraclinoid internal carotid artery. Normal flow  related enhancement of the included RIGHT cervical, petrous, cavernous and supra clinoid internal carotid arteries. Patent anterior communicating artery. Normal flow related enhancement of the bilateral anterior cerebral arteries. Decreased flow related enhancement of LEFT middle cerebral artery, though vessel appears patent. Decreased number of distal LEFT middle cerebral artery branches. Mild luminal irregularity of the LEFT MCA.  No high-grade stenosis, aneurysm.  Posterior circulation: RIGHT vertebral artery is dominant. Basilar artery is patent, with normal flow related enhancement of the main branch vessels. Fetal origin LEFT posterior cerebral artery, small RIGHT posterior communicating artery present. Normal flow related enhancement of the posterior cerebral arteries.  No large vessel occlusion, high-grade stenosis, abnormal luminal irregularity, aneurysm.  IMPRESSION: MRI HEAD: Acute large LEFT middle cerebral artery territory infarct without hemorrhagic conversion.  Tubular susceptibility artifact LEFT cerebrum most compatible with thromboembolism distal LEFT MCA segment.  Mild white matter changes compatible with chronic small vessel ischemic disease.  MRA HEAD: LEFT internal carotid artery  occlusion, recommend angiographic imaging in neck for further assessment.  Slow flow within LEFT middle cerebral artery, retrograde filling via LEFT PCOM. Complete circle of Willis. Mild LEFT MCA luminal irregularity which suggests atherosclerosis.   Electronically Signed   By: Elon Alas M.D.   On: 09/25/2014 06:58   Mr Jodene Nam Head/brain Wo Cm  09/25/2014   CLINICAL DATA:  Status post RIGHT ear tympanoplasty yesterday, subsequent altered mental status and difficulty speaking. Follow-up stroke. History of atrial fibrillation.  EXAM: MRI HEAD WITHOUT CONTRAST  MRA HEAD WITHOUT CONTRAST  TECHNIQUE: Multiplanar, multiecho pulse sequences of the brain and surrounding structures were obtained without intravenous  contrast. Angiographic images of the head were obtained using MRA technique without contrast.  COMPARISON:  CT head September 24, 2014  FINDINGS: MRI HEAD FINDINGS  Punctate focus of reduced diffusion LEFT frontal lobe. In addition, large wedge-like area of reduced diffusion LEFT temporal parietal lobe, corresponding low ADC values, mild FLAIR T2 hyperintense signal. No susceptibility artifact to suggest hemorrhagic conversion. Tubular susceptibility artifact LEFT sylvian fissure extending to the cortex consistent with thrombosed distal MCA branch.  Ventricles and sulci are otherwise normal for patient's age. A few scattered subcentimeter supratentorial white matter T2 hyperintensities exclusive of the aforementioned abnormality. No midline shift.  No abnormal extra-axial fluid collections. Ocular globes and orbital contents are unremarkable. Paranasal sinuses are well-aerated. RIGHT middle ear and mastoid effusion, soft tissue opacifies the RIGHT external auditory canal. Small LEFT mastoid effusion. No abnormal sellar expansion. No cerebellar tonsillar ectopia. No suspicious calvarial bone marrow signal.  MRA HEAD FINDINGS  Anterior circulation: Complete loss of flow related enhancement within the included LEFT cervical internal carotid artery to the level of the supraclinoid internal carotid artery. Normal flow related enhancement of the included RIGHT cervical, petrous, cavernous and supra clinoid internal carotid arteries. Patent anterior communicating artery. Normal flow related enhancement of the bilateral anterior cerebral arteries. Decreased flow related enhancement of LEFT middle cerebral artery, though vessel appears patent. Decreased number of distal LEFT middle cerebral artery branches. Mild luminal irregularity of the LEFT MCA.  No high-grade stenosis, aneurysm.  Posterior circulation: RIGHT vertebral artery is dominant. Basilar artery is patent, with normal flow related enhancement of the main branch  vessels. Fetal origin LEFT posterior cerebral artery, small RIGHT posterior communicating artery present. Normal flow related enhancement of the posterior cerebral arteries.  No large vessel occlusion, high-grade stenosis, abnormal luminal irregularity, aneurysm.  IMPRESSION: MRI HEAD: Acute large LEFT middle cerebral artery territory infarct without hemorrhagic conversion.  Tubular susceptibility artifact LEFT cerebrum most compatible with thromboembolism distal LEFT MCA segment.  Mild white matter changes compatible with chronic small vessel ischemic disease.  MRA HEAD: LEFT internal carotid artery occlusion, recommend angiographic imaging in neck for further assessment.  Slow flow within LEFT middle cerebral artery, retrograde filling via LEFT PCOM. Complete circle of Willis. Mild LEFT MCA luminal irregularity which suggests atherosclerosis.   Electronically Signed   By: Elon Alas M.D.   On: 09/25/2014 06:58    Medications:  Scheduled: .  stroke: mapping our early stages of recovery book   Does not apply Once  . aspirin  300 mg Rectal Daily   Or  . aspirin  325 mg Oral Daily  .  ceFAZolin (ANCEF) IV  1 g Intravenous 3 times per day  . metoprolol  2.5 mg Intravenous 4 times per day   Continuous: . sodium chloride     PRN:  Assessment/Plan:  Principal Problem:   Stroke Active  Problems:   PAF (paroxysmal atrial fibrillation)   Acute CVA (cerebrovascular accident)    Acute stroke involving left MCA territory Stroke workup is in progress. Neurology is following. Carotid Doppler suggests occlusion of the left ICA. Complete occlusion noted on CT scan as well. Continue aspirin for now. Considering his history of atrial fibrillation he will need anticoagulation. This can be started in a few days' time per neurology. PT and OT to evaluate. Speech therapist to evaluate. Patient is hemodynamically stable. Will eventually need a statin when able to take orally. LDL 102. HbA1c is 5.9.  Echocardiogram is pending. Will likely need rehabilitation.  Paroxysmal atrial fibrillation Patient going in and out of atrial fibrillation. Currently on intravenous beta blocker. Seen by cardiology recently in March. Apparently his initial episode of atrial fibrillation was in the setting of surgery few years ago. He was on anticoagulation, but then apparently stopped it on its own. Did not follow-up with cardiology till recently. He underwent a stress test and a echocardiogram in March. Stress test did not show any ischemia. It appears that an event monitor was utilized, which revealed episodes of paroxysmal A. fib. It does not appear that he has had a chance to follow-up with the cardiologist regarding these findings. In view of his stroke, he would need to be on anticoagulation.  Status post right ear procedure 7/28 Discussed with Dr. Lucia Gaskins with ENT. He recommends Ancef for 5 days. This was initiated on July 29. No surgical contraindication to initiating anticoagulation or aspirin.  DVT Prophylaxis: SCDs    Code Status: Full code  Family Communication: No family at bedside  Disposition Plan: Await stroke workup to be completed. He will likely need some form of rehabilitation at the time of discharge. Await swallow evaluation. Will need oral anticoagulation.    LOS: 1 day   [facility] Pager [phone] 09/26/2014, 7:54 AM  If 7PM-7AM, please contact night-coverage at [email or web address], password [facility]

## 2014-09-26 NOTE — Evaluation (Signed)
Clinical/Bedside Swallow Evaluation Patient Details  Name: [name] MRN: [id number] Date of Birth: 1955/12/10  Today's Date: 09/26/2014 Time: SLP Start Time (ACUTE ONLY): 0840 SLP Stop Time (ACUTE ONLY): 0905 SLP Time Calculation (min) (ACUTE ONLY): 25 min  Past Medical History:  Past Medical History  Diagnosis Date  . Atrial fibrillation   . Basal cell carcinoma   . Nephrolithiasis    Past Surgical History:  Past Surgical History  Procedure Laterality Date  . Lithotripsy    . Vasectomy     HPI:  Pt is a 59 y.o. male with PMH paroxysmal A.fib who underwent ear procedure on 7/28, following which pt was found to be confused and with difficulty communicating verbally which was initially thought to be related to anesthesia and sedating meds but then showed no improvement. MRI 7/29 showed large acute L MCA infarct. CXR showed mild R basilar atelectasis which appeared chronic. Failed RN stroke swallow screen- not following commands, did not cough on command. Bedside swallow eval ordered to further assess swallow function and SLE ordered as part of stroke workup.   Assessment / Plan / Recommendation Clinical Impression  Pt showed no immediate overt s/s of aspiration at bedside- swallow appeared timely with adequate hyolaryngeal excursion. Did have a delayed cough only x1 out of multiple trials of thin liquid. No oropharyngeal difficulties noted with puree or solid consistencies; pt does need some assistance with self-feeding when using utensil. CXR showed mild bibasilar atelectasis that was described as likely chronic. Aspiration risk appears mild at this time. Recommend initiating regular diet, thin liquids, meds whole with liquid, provide supervision as needed to assist with self-feeding. SLP will sign off for swallowing but will continue to follow for language skills.     Aspiration Risk  Mild    Diet Recommendation Age appropriate regular solids;Thin   Medication  Administration: Whole meds with liquid Compensations: Slow rate;Small sips/bites    Other  Recommendations Oral Care Recommendations: Oral care BID   Follow Up Recommendations       Frequency and Duration        Pertinent Vitals/Pain n/a    SLP Swallow Goals     Swallow Study Prior Functional Status       General Other Pertinent Information: Pt is a 59 y.o. male with PMH paroxysmal A.fib who underwent ear procedure on 7/28, following which pt was found to be confused and with difficulty communicating verbally which was initially thought to be related to anesthesia and sedating meds but then showed no improvement. MRI 7/29 showed large acute L MCA infarct. CXR showed mild R basilar atelectasis which appeared chronic. Failed RN stroke swallow screen- not following commands, did not cough on command. Bedside swallow eval ordered to further assess swallow function and SLE ordered as part of stroke workup. Type of Study: Bedside swallow evaluation Diet Prior to this Study: NPO Temperature Spikes Noted: Yes Respiratory Status: Room air History of Recent Intubation: No Behavior/Cognition: Alert;Cooperative;Requires cueing;Doesn't follow directions Oral Cavity - Dentition: Adequate natural dentition/normal for age Self-Feeding Abilities: Needs assist Patient Positioning: Upright in bed Baseline Vocal Quality: Normal Volitional Cough: Cognitively unable to elicit Volitional Swallow: Unable to elicit    Oral/Motor/Sensory Function Overall Oral Motor/Sensory Function: Impaired (difficult to fully assess) Labial ROM: Reduced right (mild droop) Labial Symmetry: Abnormal symmetry right   Ice Chips Ice chips: Not tested   Thin Liquid Thin Liquid: Impaired Presentation: Cup;Straw Pharyngeal  Phase Impairments: Cough - Delayed (x1)    Nectar Thick Nectar  Thick Liquid: Not tested   Honey Thick Honey Thick Liquid: Not tested   Puree Puree: Within functional limits Presentation: Self  Fed;Spoon   Solid   GO    Solid: Within functional limits Presentation: [place], [place], [place], CCC-SLP 09/26/2014,9:13 AM [phone]

## 2014-09-27 DIAGNOSIS — I779 Disorder of arteries and arterioles, unspecified: Secondary | ICD-10-CM

## 2014-09-27 DIAGNOSIS — I6523 Occlusion and stenosis of bilateral carotid arteries: Secondary | ICD-10-CM

## 2014-09-27 LAB — CBC
HCT: 42.6 % (ref 39.0–52.0)
Hemoglobin: 14.4 g/dL (ref 13.0–17.0)
MCH: 30.6 pg (ref 26.0–34.0)
MCHC: 33.8 g/dL (ref 30.0–36.0)
MCV: 90.6 fL (ref 78.0–100.0)
Platelets: 200 10*3/uL (ref 150–400)
RBC: 4.7 MIL/uL (ref 4.22–5.81)
RDW: 12.4 % (ref 11.5–15.5)
WBC: 10.3 10*3/uL (ref 4.0–10.5)

## 2014-09-27 LAB — GLUCOSE, CAPILLARY
GLUCOSE-CAPILLARY: 99 mg/dL (ref 65–99)
Glucose-Capillary: 78 mg/dL (ref 65–99)

## 2014-09-27 LAB — BASIC METABOLIC PANEL
Anion gap: 7 (ref 5–15)
BUN: 12 mg/dL (ref 6–20)
CHLORIDE: 105 mmol/L (ref 101–111)
CO2: 26 mmol/L (ref 22–32)
CREATININE: 1.01 mg/dL (ref 0.61–1.24)
Calcium: 8.7 mg/dL — ABNORMAL LOW (ref 8.9–10.3)
GFR calc non Af Amer: >60 mL/min (ref >60)
Glucose, Bld: 100 mg/dL — ABNORMAL HIGH (ref 65–99)
POTASSIUM: 4.2 mmol/L (ref 3.5–5.1)
Sodium: 138 mmol/L (ref 135–145)

## 2014-09-27 MED ORDER — METOPROLOL TARTRATE 25 MG PO TABS: 25.0000 mg | ORAL_TABLET | Freq: Two times a day (BID) | ORAL | Status: DC

## 2014-09-27 NOTE — Progress Notes (Signed)
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 He also had a left forearm skin lesion removed. He was under anesthesia for prolonged duration. There was difficulty in waking him up and he was confused. He was monitored for an extended period of time. Subsequently he was transferred to the emergency department. Evaluation revealed an acute stroke.  Past medical history:  Past Medical History  Diagnosis Date  . Atrial fibrillation   . Basal cell carcinoma   . Nephrolithiasis     Consultants: Neurology  Procedures:  2-D echocardiogram  Study Conclusions - Left ventricle: The cavity size was normal. There was mildconcentric hypertrophy. Systolic function was normal. Theestimated ejection fraction was in the range of 60% to 65%. Wallmotion was normal; there were no regional wall motionabnormalities. Left ventricular diastolic function parameterswere normal. Lateral annulus E velocity: 11.4 cm/s. Medialannulus E velocity: 9.68 cm/s. - Aorta: Aortic root at upper normal limits of size. Aortic rootdimension: 39 mm (ED). - Mitral valve: There was mild regurgitation. - Systemic veins: IVC dilated with normal respiratory variation.Estimated CVP 8 mmHg.  Carotid Doppler Right: 40-59% ICA stenosis. Left: ICA is occluded. Bilateral: Vertebral artery flow is antegrade.   Antibiotics: Started on Ancef 7/29 per ENT  Subjective: Patient occasionally speaks full sentences. But still unable to communicate appropriately.   Objective: Vital Signs  Filed Vitals:   09/26/14 1927 09/26/14 2156 09/27/14 0145 09/27/14 0653  BP: 120/88 137/59 123/74 136/76  Pulse: 57  71 71 78  Temp: 98.4 F (36.9 C) 98.1 F (36.7 C) 98.1 F (36.7 C) 98 F (36.7 C)  TempSrc: Oral Oral Oral Oral  Resp: 18 18 18 18   Height: 5\' 10"  (1.778 m)     Weight: 98.8 kg (217 lb 13 oz)     SpO2: 98% 98% 99% 96%    Intake/Output Summary (Last 24 hours) at 09/27/14 0750 Last data filed at 09/27/14 0654  Gross per 24 hour  Intake      0 ml  Output    600 ml  Net   -600 ml   Filed Weights   09/25/14 0745 09/26/14 1927  Weight: 92.7 kg (204 lb 5.9 oz) 98.8 kg (217 lb 13 oz)    General appearance: alert, appears stated age, distracted and no distress Resp: clear to auscultation bilaterally Cardio: regular rate and rhythm, S1, S2 normal, no murmur, click, rub or gallop GI: soft, non-tender; bowel sounds normal; no masses,  no organomegaly Neurologic: Moment of all his extremities noted. Does not follow commands. No facial droop. He does have significant aphasia.  Lab Results:  Basic Metabolic Panel:  Recent Labs Lab 09/24/14 2227 09/24/14 2235 09/25/14 0400 09/26/14 0221 09/27/14 0555  NA 140 142 139 138 138  K 4.0 4.0 3.9 4.0 4.2  CL 107 104 105 107 105  CO2 24  --  26 24 26   GLUCOSE 115* 118* 108* 95 100*  BUN 9 12 10 10 12   CREATININE 0.96 0.80 0.95 0.95 1.01  CALCIUM 9.4  --  8.7* 8.6* 8.7*   Liver Function Tests:  Recent Labs Lab 09/24/14 2227 09/25/14 0400  AST 27 23  ALT 27 22  ALKPHOS 61 53  BILITOT 1.0 0.9  PROT 7.3 6.1*  ALBUMIN 4.2 3.5   CBC:  Recent Labs Lab 09/24/14 2227 09/24/14 2235 09/25/14 0400 09/26/14 0221 09/27/14 0555  WBC 13.5*  --  14.0* 11.1* 10.3  NEUTROABS 11.4*  --  11.0*  --   --   HGB 14.8 16.0 13.5 13.9 14.4  HCT 42.2 47.0 40.2 41.1 42.6  MCV 88.8  --  90.5 90.3 90.6  PLT 231  --  201 189 200    CBG:  Recent Labs Lab 09/26/14 0809 09/26/14 1131 09/26/14 1649 09/26/14 2136 09/27/14 0643  GLUCAP 101* 99 89 138* 99    Recent Results (from the past 240 hour(s))  MRSA PCR Screening     Status: None     Collection Time: 09/25/14  7:12 AM  Result Value Ref Range Status   MRSA by PCR NEGATIVE NEGATIVE Final    Comment:        The GeneXpert MRSA Assay (FDA approved for NASAL specimens only), is one component of a comprehensive MRSA colonization surveillance program. It is not intended to diagnose MRSA infection nor to guide or monitor treatment for MRSA infections.       Studies/Results: Ct Angio Neck W/cm &/or Wo/cm  09/25/2014   CLINICAL DATA:  Expressive and receptive a aphasia. Left ICA occlusion.  EXAM: CT ANGIOGRAPHY NECK  TECHNIQUE: Multidetector CT imaging of the neck was performed using the standard protocol during bolus administration of intravenous contrast. Multiplanar CT image reconstructions and MIPs were obtained to evaluate the vascular anatomy. Carotid stenosis measurements (when applicable) are obtained utilizing NASCET criteria, using the distal internal carotid diameter as the denominator.  CONTRAST:  39mL OMNIPAQUE IOHEXOL 350 MG/ML SOLN  COMPARISON:  MRI brain and MRA head from the same day.  FINDINGS: Right carotid system: The visualized right common carotid artery is within normal limits. Extensive plaque is present in the proximal right internal carotid artery. Minimal luminal diameter is 1.9 mm. This compares with a more distal lumen of 5.5 mm. No other significant stenoses are present within the right ICA.  Left carotid system: The visualize left common carotid artery is within normal limits. 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 The PCA branch vessels are visualized. There is no reconstitution of the vessel in the neck.  Vertebral arteries:The vertebral arteries both originate from the subclavian arteries. The vertebral artery origins are visualized and normal. There is no significant stenosis or vascular injury to the vertebral arteries in the neck. The right vertebral artery is slightly dominant to the left. The PICA origins are visualized  bilaterally. The vertebrobasilar junction is intact.  Skeleton: Mild endplate degenerative change in uncovertebral spurring is present at C5-6 bilaterally with osseous foraminal narrowing is slightly worse a left. No focal lytic or blastic lesions are present. Fluid is present in the inferior right mastoid air cells. There is fluid or soft tissue throughout the right external auditory canal and the right middle ear cavity is well.  Other neck: Limited imaging the brain is unremarkable. The globes and orbits are intact. Salivary glands are within normal limits. No focal mucosal lesions are present. The thyroid is unremarkable. The lung apices are clear.  IMPRESSION: 1. 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 2. The right internal carotid artery is narrowed to 1.9 mm. This represents the 65% stenosis relative to the more distal vessel at 5.5 mm. 3. The vertebral arteries are intact bilaterally. 4. Degenerative changes of the cervical spine are most evident at C5-6 with  mild osseous foraminal narrowing bilaterally.   Electronically Signed   By: San Morelle M.D.   On: 09/25/2014 18:36    Medications:  Scheduled: .  stroke: mapping our early stages of recovery book   Does not apply Once  . aspirin  300 mg Rectal Daily   Or  . aspirin  325 mg Oral Daily  . atorvastatin  40 mg Oral q1800  .  ceFAZolin (ANCEF) IV  1 g Intravenous 3 times per day  . metoprolol  2.5 mg Intravenous 4 times per day   Continuous: . sodium chloride 50 mL/hr at 09/26/14 1940   PRN:  Assessment/Plan:  Principal Problem:   Stroke Active Problems:   PAF (paroxysmal atrial fibrillation)   Acute CVA (cerebrovascular accident)    Acute stroke involving left MCA territory Neurology is following. Carotid Doppler suggests occlusion of the left ICA. Complete occlusion noted on CT scan as well. Right ICA disease noted. Vascular surgery consulted. Discussed with Dr. Bridgett Larsson. Continue aspirin for now.  Considering his history of atrial fibrillation he will need anticoagulation. This can be started in a few days' time per neurology. Can initiate Eliquis tomorrow. PT and OT to evaluate. Speech therapist has seen the patient. He is not on a diet. Statin has been initiated. LDL 102. HbA1c is 5.9. Echocardiogram is as above. Will likely need rehabilitation.  Carotid artery disease Complete occlusion of the left ICA. 65% occlusion, right ICA. Vascular surgery consulted.  Paroxysmal atrial fibrillation Patient going in and out of atrial fibrillation. Currently on intravenous beta blocker. Seen by cardiology recently in March. Apparently his initial episode of atrial fibrillation was in the setting of surgery few years ago. He was on anticoagulation, but then apparently stopped it on its own. Did not follow-up with cardiology till recently. He underwent a stress test and a echocardiogram in March. Stress test did not show any ischemia. It appears that an event monitor was utilized, which revealed episodes of paroxysmal A. fib. It does not appear that he has had a chance to follow-up with the cardiologist regarding these findings. In view of his stroke, he would need to be on anticoagulation.  Status post right ear procedure 7/28 Discussed with Dr. Lucia Gaskins with ENT. He recommends Ancef for 5 days. This was initiated on July 29. No surgical contraindication to initiating anticoagulation or aspirin.  DVT Prophylaxis: SCDs    Code Status: Full code  Family Communication: No family at bedside  Disposition Plan: CIR consult is pending. He will likely need some form of rehabilitation at the time of discharge. Will need oral anticoagulation.    LOS: 2 days   [facility] Pager [phone] 09/27/2014, 7:50 AM  If 7PM-7AM, please contact night-coverage at [email or web address], password [facility]

## 2014-09-27 NOTE — Clinical Social Work Note (Signed)
Clinical Social Work Assessment  Patient Details  Name: [name] MRN: [id number] Date of Birth: [birth year]  Date of referral:  09/27/14               Reason for consult:  Facility Placement                Permission sought to share information with:  Family Supports Permission granted to share information::  No (pt unable to communicate well at this time/cognition unknown)  Name::     [name]  Agency::  [facility]  Relationship::  wife  Contact Information:     Housing/Transportation Living arrangements for the past 2 months:  Single Family Home Source of Information:  Spouse Patient Interpreter Needed:  None Criminal Activity/Legal Involvement Pertinent to Current Situation/Hospitalization:  No - Comment as needed Significant Relationships:  Spouse Lives with:  Spouse Do you feel safe going back to the place where you live?  Yes Need for family participation in patient care:  Yes (Comment)  Care giving concerns:  Pt requiring too much assistance for wife at this time   Facilities manager / plan:  CSW spoke with pt wife concerning SNF as alternative to CIR  Employment status:    Insurance information:  Managed Care PT Recommendations:  Inpatient Rehab Consult Information / Referral to community resources:  [place]  Patient/Family's Response to care:  Pt wife is agreeable to SNF if CIR is not an option- would like pt to get rehab after such a traumatic event  Patient/Family's Understanding of and Emotional Response to Diagnosis, Current Treatment, and Prognosis:  Pt wife had no questions or concerns about pt care at this time  Emotional Assessment Appearance:    Attitude/Demeanor/Rapport:  Unable to Assess Affect (typically observed):  Unable to Assess Orientation:   (unclear- pt is aphasic) Alcohol / Substance use:  Not Applicable Psych involvement (Current and /or in the community):  No (Comment)  Discharge Needs  Concerns to be  addressed:  Care Coordination Readmission within the last 30 days:  No Current discharge risk:  Physical Impairment Barriers to Discharge:  Continued Medical Work up   Cranford Mon, LCSW 09/27/2014, 4:14 PM

## 2014-09-27 NOTE — Evaluation (Signed)
Occupational Therapy Evaluation Patient Details Name: [name] MRN: [id number] DOB: 01-20-1956 Today's Date: 09/27/2014    History of Present Illness Adm 7/28 with Lt MCA CVA; found to have Lt ICA occlusion and Rt ICA 65% stenosis PMHx-PAF, Rt ear tympanoplasty surgery 09/23/14 (awoke from surgery with CVA symptoms)   Clinical Impression   Pt admitted with above. Pt independent with ADLs, PTA. Feel pt will benefit from acute OT to increase independence prior to d/c. Recommending CIR for rehab and feel pt would greatly benefit.     Follow Up Recommendations  CIR    Equipment Recommendations  Other (comment) (defer to next venue)    Recommendations for Other Services       Precautions / Restrictions Precautions Precautions: Fall Restrictions Weight Bearing Restrictions: No      Mobility Bed Mobility Overal bed mobility: Needs Assistance Bed Mobility: Supine to Sit     Supine to sit: Supervision        Transfers Overall transfer level: Needs assistance   Transfers: Sit to/from Stand;Stand Pivot Transfers Stand to Sit/Sit to Stand: Min assist;Min guard Stand pivot transfers: Min assist       General transfer comment: cues for hand placement and assist for pt to sit down.     Balance  Min assist for stand pivot.                                           ADL Overall ADL's : Needs assistance/impaired Eating/Feeding: Minimal assistance;Sitting   Grooming: Oral care;Minimal assistance;Sitting;Wash/dry hands;Wash/dry face   Upper Body Bathing: Minimal assistance; Sitting   Lower Body Bathing: Moderate assistance;Sit to/from stand   Upper Body Dressing : Minimal-Moderate assistance;Sitting   Lower Body Dressing: Sit to/from stand;Minimal assistance;Moderate assistance   Toilet Transfer: Minimal assistance;Stand-pivot (from bed to chair)           Functional mobility during ADLs: Minimal assistance General ADL Comments: Pt  requiring several verbal and visual cues in session. Pt at first not understanding that OT wanted him to doff socks, but he was able to do it later in session. Pt incontinent of urine and bed was bed. With cues, pt able to wash off legs, but would not wash peri area or buttocks. Pt initially missed the end of toothbrush when applying toothpaste.     Vision  Difficult to assess.   Perception     Praxis      Pertinent Vitals/Pain Pain Assessment: Faces Faces Pain Scale: Hurts little more Pain Location: head Pain Descriptors / Indicators: Headache Pain Intervention(s): Monitored during session     Hand Dominance Right   Extremity/Trunk Assessment Upper Extremity Assessment Upper Extremity Assessment: RUE deficits/detail RUE Deficits / Details: appeared to have coordination difficulties in RUE; strength/sensation difficult to assess due to impaired communication   Lower Extremity Assessment Lower Extremity Assessment: Defer to PT evaluation       Communication Communication Communication: Receptive difficulties;Expressive difficulties   Cognition Arousal/Alertness: Awake/alert Behavior During Therapy: WFL for tasks assessed/performed Overall Cognitive Status: Difficult to assess due to impaired communication                     General Comments       Exercises       Shoulder Instructions      Home Living Family/patient expects to be discharged to:: Private residence Living Arrangements: Spouse/significant  other   Type of Home: House Home Access: Stairs to enter [employer] of Steps: 2 Entrance Stairs-Rails: Right                 Home Equipment: None          Prior Functioning/Environment Level of Independence: Independent        Comments: works for [employer]; plays tennis/active    OT Diagnosis: Other (comment) (Decreased coordination)   OT Problem List: Impaired balance (sitting and/or standing);Decreased  coordination;Decreased cognition;Decreased knowledge of use of DME or AE;Decreased knowledge of precautions;Pain;Impaired UE functional use   OT Treatment/Interventions: Self-care/ADL training;Therapeutic exercise;Neuromuscular education;DME and/or AE instruction;Therapeutic activities;Visual/perceptual remediation/compensation;Cognitive remediation/compensation;Patient/family education;Balance training    OT Goals(Current goals can be found in the care plan section) Acute Rehab OT Goals Patient Stated Goal: not stated OT Goal Formulation: With patient Time For Goal Achievement: 10/04/14 Potential to Achieve Goals: Good ADL Goals Pt Will Perform Grooming: with set-up;sitting Pt Will Perform Upper Body Dressing: with set-up;sitting Pt Will Perform Lower Body Dressing: with set-up;sit to/from stand;with supervision Pt Will Transfer to Toilet: with supervision;ambulating Pt Will Perform Toileting - Clothing Manipulation and hygiene: with supervision;sit to/from stand  OT Frequency: Min 2X/week   Barriers to D/C:            Co-evaluation              End of Session Equipment Utilized During Treatment: Gait belt Nurse Communication: Mobility status (have +2 to get pt up)  Activity Tolerance: Patient tolerated treatment well Patient left: in chair;with call bell/phone within reach;with chair alarm set   Time: 4356-8616 OT Time Calculation (min): 18 min Charges:  OT General Charges $OT Visit: 1 Procedure OT Evaluation $Initial OT Evaluation Tier I: 1 Procedure G-CodesBenito Glass OTR/L [id number] 09/27/2014, 9:41 AM

## 2014-09-27 NOTE — Progress Notes (Signed)
STROKE TEAM PROGRESS NOTE   HISTORY [name] is an 59 y.o. male with a history of atrial fibrillation not on anticoagulation, basal cell carcinoma and nephrolithiasis, was brought to the emergency room for further evaluation after exhibiting prolonged confusion following surgical procedure earlier today. He underwent right tympanoplasty. He was last known well at 7 AM today. At the end of the procedure he was noted to be confused and having difficulty with being able to communicate verbally, as well as follow commands. Change in mental status was initially thought to be related to anesthesia and sedating medications. With no improvement over time he was referred for further evaluation. CT scan of his head showed an acute moderate size left history temporal and parietal ischemic infarction with mild mass effect. He has no history of TIA or stroke. His wife is uncertain of aspirin was discontinued during the week prior to his surgical procedure. NIH stroke score was 9.  LSN: 7 AM on 09/24/2014 tPA Given: No: Moderate-sized stroke demonstrated on CT; be on time window for treatment consideration mRankin:    SUBJECTIVE (INTERVAL HISTORY) His wife and son were at the bedside.  Overall he cannot express how feels his condition is due to aphasia    OBJECTIVE Temp:  [97.7 F (36.5 C)-99.7 F (37.6 C)] 99.3 F (37.4 C) (07/31 0903) Pulse Rate:  [52-78] 62 (07/31 0903) Cardiac Rhythm:  [-] Atrial fibrillation;Normal sinus rhythm (07/30 2030) Resp:  [16-18] 16 (07/31 0903) BP: (117-137)/(59-88) 131/77 mmHg (07/31 0903) SpO2:  [96 %-99 %] 98 % (07/31 0903) Weight:  [98.8 kg (217 lb 13 oz)] 98.8 kg (217 lb 13 oz) (07/30 1927)   Recent Labs Lab 09/26/14 0809 09/26/14 1131 09/26/14 1649 09/26/14 2136 09/27/14 0643  GLUCAP 101* 99 89 138* 99    Recent Labs Lab 09/24/14 2227 09/24/14 2235 09/25/14 0400 09/26/14 0221 09/27/14 0555  NA 140 142 139 138 138  K 4.0 4.0 3.9 4.0 4.2   CL 107 104 105 107 105  CO2 24  --  26 24 26   GLUCOSE 115* 118* 108* 95 100*  BUN 9 12 10 10 12   CREATININE 0.96 0.80 0.95 0.95 1.01  CALCIUM 9.4  --  8.7* 8.6* 8.7*    Recent Labs Lab 09/24/14 2227 09/25/14 0400  AST 27 23  ALT 27 22  ALKPHOS 61 53  BILITOT 1.0 0.9  PROT 7.3 6.1*  ALBUMIN 4.2 3.5    Recent Labs Lab 09/24/14 2227 09/24/14 2235 09/25/14 0400 09/26/14 0221 09/27/14 0555  WBC 13.5*  --  14.0* 11.1* 10.3  NEUTROABS 11.4*  --  11.0*  --   --   HGB 14.8 16.0 13.5 13.9 14.4  HCT 42.2 47.0 40.2 41.1 42.6  MCV 88.8  --  90.5 90.3 90.6  PLT 231  --  201 189 200   No results for input(s): CKTOTAL, CKMB, CKMBINDEX, TROPONINI in the last 168 hours.  Recent Labs  09/24/14 2227  LABPROT 15.1  INR 1.17    Recent Labs  09/24/14 2249  COLORURINE YELLOW  LABSPEC 1.007  PHURINE 7.5  GLUCOSEU NEGATIVE  HGBUR SMALL*  BILIRUBINUR NEGATIVE  KETONESUR NEGATIVE  PROTEINUR NEGATIVE  UROBILINOGEN 0.2  NITRITE NEGATIVE  LEUKOCYTESUR NEGATIVE       Component Value Date/Time   CHOL 156 09/25/2014 0400   TRIG 117 09/25/2014 0400   HDL 31* 09/25/2014 0400   CHOLHDL 5.0 09/25/2014 0400   VLDL 23 09/25/2014 0400   LDLCALC 102* 09/25/2014 0400  Lab Results  Component Value Date   HGBA1C 5.9* 09/25/2014   No results found for: LABOPIA, COCAINSCRNUR, LABBENZ, AMPHETMU, THCU, LABBARB  No results for input(s): ETH in the last 168 hours.   Imaging   Dg Chest 2 View  09/25/2014    Mild right basilar atelectasis. Interstitial thickening, this appears chronic.       Ct Head Wo Contrast 09/24/2014    Acute nonhemorrhagic infarction in the posterior left temporal lobe and left parietal lobe. Mild mass effect with sulcal effacement but no midline shift.     Ct Angio Neck W/cm &/or Wo/cm 09/25/2014    1. 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  2. The right internal carotid artery is narrowed to 1.9 mm. This represents the 65%  stenosis relative to the more distal vessel at 5.5 mm.  3. The vertebral arteries are intact bilaterally.  4. Degenerative changes of the cervical spine are most evident at C5-6 with mild osseous foraminal narrowing bilaterally.       Mr Brain Wo Contrast 09/25/2014     MRI HEAD:  Acute large LEFT middle cerebral artery territory infarct without hemorrhagic conversion.  Tubular susceptibility artifact LEFT cerebrum most compatible with thromboembolism distal LEFT MCA segment.  Mild white matter changes compatible with chronic small vessel ischemic disease.    MRA HEAD:  LEFT internal carotid artery occlusion, recommend angiographic imaging in neck for further assessment.  Slow flow within LEFT middle cerebral artery, retrograde filling via LEFT PCOM. Complete circle of Willis. Mild LEFT MCA luminal irregularity which suggests atherosclerosis.       PHYSICAL EXAM HEENT- Normocephalic, no lesions, without obvious abnormality. Normal external eye and conjunctiva. Normal TM's bilaterally. Normal auditory canals and external ears. Normal external nose, mucus membranes and septum. Normal pharynx. Neck supple with no masses, nodes, nodules or enlargement. Cardiovascular - regular rate and rhythm, S1, S2 normal, no murmur, click, rub or gallop Lungs - chest clear, no wheezing, rales, normal symmetric air entry Abdomen - soft, non-tender; bowel sounds normal; no masses, no organomegaly Extremities - no joint deformities, effusion, or inflammation, no edema and no skin discoloration  Neurologic Examination: Mental Status: Alert, severe receptive aphasia as well as essentially word salad speech output. Able to follow commands with cues. There was neglect of his right side. Cranial Nerves: II-dense right homonymous hemianopsia. III/IV/VI-Pupils were equal and reacted normally to light. Gaze preference to the left side with no movement of eyes afterwards beyond midline.  VII-moderate right  lower facial weakness. VIII-normal. X-mild dysarthria. XI: trapezius strength/neck flexion strength normal bilaterally XII-midline tongue extension with normal strength. Motor: No drift of upper nor lower extremities with equal strength bilaterally in upper and lower extremities; flaccid muscle tone throughout. Sensory: Neglect of right side. Deep Tendon Reflexes: 2+ and symmetric. Plantars: Mute bilaterally Cerebellar: Unable to assess due to language barrier with severe receptive aphasia. Carotid auscultation: Normal   ASSESSMENT/PLAN [name] is a 59 y.o. male with history of atrial fibrillation not on anticoagulation, basal cell carcinoma, and nephrolithiasis presenting with prolonged confusion with difficulty communicating and following commands. He did not receive IV t-PA due to late presentation and moderate sized stroke.  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  Resultant  Global aphasia  MRI  Acute large LEFT middle cerebral artery territory infarct.  MRA  LEFT internal carotid artery occlusion,   CTA - as above  Carotid  Doppler  see CTA of neck  2D Echo pending  LDL 102  HgbA1c 5.9  SCDs for VTE prophylaxis Diet Heart Room service appropriate?: Yes; Fluid consistency:: Thin  aspirin 81 mg orally every day prior to admission, now on aspirin 325 mg orally every day  Patient counseled to be compliant with his antithrombotic medications  Ongoing aggressive stroke risk factor management  Therapy recommendations:  Inpatient rehabilitation recommended - rehabilitation M.D. consult pending  Disposition: Pending  Hypertension  Home meds: Toprol 25 mg twice daily (no doubt also used for atrial fibrillation rate control)  Blood pressure mildly low   Hyperlipidemia  Home meds: No lipid lowering medications prior to admission  LDL 102, goal < 70  Now on Lipitor  40 mg daily.  Continue statin at discharge  Other Stroke Risk Factors  Advanced age  ETOH use   Atrial fibrillation  Hospital day # 2   Mikey Bussing PA-C [facility] Pager [phone] 09/26/2014, 1:52 PM   ATTENDING NOTE: Patient was seen and examined by me personally. I reviewed notes, independently viewed imaging studies, participated in medical decision making and plan of care. I have made additions or clarifications directly to the above note.  Documentation accurately reflects findings. The laboratory and radiographic studies were personally reviewed by me.  ROS could not be fully documented due to aphasia  Assessment and plan completed by me personally and fully documented above.  Plans include:    Neuro  Consider anticoagulation for atrial fibrillation when safe from a stroke standpoint.  Left internal carotid artery occlusion with significant stenosis of the right internal carotid artery  Patient needs a vascular surgery consult for outpatient follow-up of 65% asymptomatic side  LDL 102 - Lipitor 40 mg daily initiated  Cardiac  2-D echocardiogram 60-65% EF  Atrial fibrillation - controlled ventricular response  Pulmonary  Mild right basilar atelectasis on chest x-ray - no tobacco history - afebrile  GI  Heart healthy diet  GU   Stable  Endo  Hemoglobin A1c 5.9  Heme / ID  Mild leukocytosis  Ancef following recent ENT surgery  Right ear canalplasty and tympanoplasty to enlarge the right ear canal and 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  PT/OT/ST for discharge planning.  Recommend Case Management provide information for Caregiver Support to wife  SIGNED BY: Dr. Elissa Hefty        To contact Stroke Continuity provider, please refer to [email or web address] After hours, contact General Neurology

## 2014-09-27 NOTE — Consult Note (Addendum)
New Carotid Patient  Referred by:  [facility]  Reason for referral: Right carotid stenosis, left carotid occlusion  History of Present Illness  [name] is a 59 y.o. (02/23/1956) male who presents with chief complaint: stroke.  Patient has both expressive and receptive aphasia so the history if reconstructed from his chart and wife.  Patient had recently undergone an outpatient surgical procedure and had slow recovery from his procedure.  His work-up eventually was consistent with stroke.  Reportedly the patient has history of atrial fibrillation not on anticoagulation.   The family notes the patient is able to ambulate with guidance.  He is sometimes able to communicate with short phrases.  This patient's risk factors for stroke include: atrial fibrillation.   Past Medical History  Diagnosis Date  . Atrial fibrillation   . Basal cell carcinoma   . Nephrolithiasis     Past Surgical History  Procedure Laterality Date  . Lithotripsy    . Vasectomy      History   Social History  . Marital Status: Married    Spouse Name: N/A  . Number of Children: N/A  . Years of Education: N/A   Occupational History  . Not on file.   Social History Main Topics  . Smoking status: Never Smoker   . Smokeless tobacco: Not on file  . Alcohol Use: Yes     Comment: socially on the weekend with a few beers  . Drug Use: No  . Sexual Activity: Not on file   Other Topics Concern  . Not on file   Social History Narrative    Family History  Problem Relation Age of Onset  . Cancer Mother   . Heart failure Father     No current facility-administered medications on file prior to encounter.   Current Outpatient Prescriptions on File Prior to Encounter  Medication Sig Dispense Refill  . aspirin 81 MG tablet Take 81 mg by mouth daily.    . metoprolol tartrate (LOPRESSOR) 25 MG tablet Take 0.5 tablets (12.5 mg total) by mouth 2 (two) times daily. 90 tablet 1   Current  Facility-Administered Medications  Medication Dose Route Frequency Provider Last Rate Last Dose  .  stroke: mapping our early stages of recovery book   Does not apply Once Rise Patience, MD      . acetaminophen (TYLENOL) tablet 650 mg  650 mg Oral Q6H PRN Bonnielee Haff, MD   650 mg at 09/27/14 1011  . aspirin suppository 300 mg  300 mg Rectal Daily Rise Patience, MD   300 mg at 09/26/14 1610   Or  . aspirin tablet 325 mg  325 mg Oral Daily Rise Patience, MD   325 mg at 09/27/14 0916  . atorvastatin (LIPITOR) tablet 40 mg  40 mg Oral q1800 Bonnielee Haff, MD   40 mg at 09/26/14 1816  . ceFAZolin (ANCEF) IVPB 1 g/50 mL premix  1 g Intravenous 3 times per day Bonnielee Haff, MD   1 g at 09/27/14 0800  . metoprolol (LOPRESSOR) injection 2.5 mg  2.5 mg Intravenous 4 times per day Bonnielee Haff, MD   2.5 mg at 09/27/14 0655  . ondansetron (ZOFRAN) injection 4 mg  4 mg Intravenous Q6H PRN Bonnielee Haff, MD   4 mg at 09/27/14 1259     No Known Allergies  REVIEW OF SYSTEMS:  (Positives checked otherwise negative)  CARDIOVASCULAR:  []  chest pain, []  chest pressure, []  palpitations, []  shortness  of breath when laying flat, []  shortness of breath with exertion,  []  pain in feet when walking, []  pain in feet when laying flat, []  history of blood clot in veins (DVT), []  history of phlebitis, []  swelling in legs, []  varicose veins  PULMONARY:  []  productive cough, []  asthma, []  wheezing  NEUROLOGIC:  []  weakness in arms or legs, []  numbness in arms or legs, []  difficulty speaking or slurred speech, []  temporary loss of vision in one eye, []  dizziness. [x]  hearing loss  HEMATOLOGIC:  []  bleeding problems, []  problems with blood clotting too easily  MUSCULOSKEL:  []  joint pain, []  joint swelling  GASTROINTEST:  []  vomiting blood, []  blood in stool     GENITOURINARY:  []  burning with urination, []  blood in urine  PSYCHIATRIC:  []  history of major depression  INTEGUMENTARY:  []   rashes, []  ulcers  CONSTITUTIONAL:  []  fever, []  chills   For VQI Use Only  PRE-ADM LIVING: Home  AMB STATUS: Ambulatory  CAD Sx: None  PRIOR CHF: None  STRESS TEST: [x]  No, [ ]  Normal, [ ]  + ischemia, [ ]  + MI, [ ]  Both   Physical Examination  Filed Vitals:   09/26/14 2156 09/27/14 0145 09/27/14 0653 09/27/14 0903  BP: 137/59 123/74 136/76 131/77  Pulse: 71 71 78 62  Temp: 98.1 F (36.7 C) 98.1 F (36.7 C) 98 F (36.7 C) 99.3 F (37.4 C)  TempSrc: Oral Oral Oral Oral  Resp: 18 18 18 16   Height:      Weight:      SpO2: 98% 99% 96% 98%   Body mass index is 31.25 kg/(m^2).  General: Awake, WDWN, appears confused  Head: [place]/AT  Ear/Nose/Throat: Cannot test hearing as does not respond appropriately to questions, nares w/o erythema or drainage, oropharynx w/o Erythema/Exudate, Mallampati score: 3  Eyes: PERRL, appears to track, but does not follow EOM testing  Neck: Supple, no nuchal rigidity, no palpable LAD  Pulmonary: Sym exp, good air movt, CTAB, no rales, rhonchi, & wheezing  Cardiac: RRR, Nl S1, S2, no Murmurs, rubs or gallops  Vascular: Vessel Right Left  Radial Palpable Palpable  Brachial Palpable Palpable  Carotid Palpable, without bruit Palpable, without bruit  Aorta Not palpable N/A  Femoral Palpable Palpable  Popliteal Not palpable Not palpable  PT Palpable Palpable  DP Palpable Palpable   Gastrointestinal: soft, NTND, -G/R, - HSM, - masses, - CVAT B  Musculoskeletal: flaps hands and feet when try to test limb ROM and strength, unable to test MS fully, Extremities without ischemic changes   Neurologic: unable to test as does not seem to follow instructions and cannot communicated  Psychiatric: unable to communicate due to aphasia  Dermatologic: See M/S exam for extremity exam, no rashes otherwise noted  Lymph : No Cervical, Axillary, or Inguinal lymphadenopathy    Non-Invasive Vascular Imaging  CAROTID DUPLEX (Date: 09/25/14):  Right:  40-59% ICA stenosis. Left: ICA appears occluded. Bilateral: Vertebral artery flow is antegrade.  Other specific details can be found in the table(s) above. Prepared and Electronically Authenticated by  Radiology: Ct Angio Neck W/cm &/or Wo/cm  09/25/2014   CLINICAL DATA:  Expressive and receptive a aphasia. Left ICA occlusion.  EXAM: CT ANGIOGRAPHY NECK  TECHNIQUE: Multidetector CT imaging of the neck was performed using the standard protocol during bolus administration of intravenous contrast. Multiplanar CT image reconstructions and MIPs were obtained to evaluate the vascular anatomy. Carotid stenosis measurements (when applicable) are obtained utilizing NASCET criteria, using  the distal internal carotid diameter as the denominator.  CONTRAST:  82mL OMNIPAQUE IOHEXOL 350 MG/ML SOLN  COMPARISON:  MRI brain and MRA head from the same day.  FINDINGS: Right carotid system: The visualized right common carotid artery is within normal limits. Extensive plaque is present in the proximal right internal carotid artery. Minimal luminal diameter is 1.9 mm. This compares with a more distal lumen of 5.5 mm. No other significant stenoses are present within the right ICA.  Left carotid system: The visualize left common carotid artery is within normal limits. 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 The PCA branch vessels are visualized. There is no reconstitution of the vessel in the neck.  Vertebral arteries:The vertebral arteries both originate from the subclavian arteries. The vertebral artery origins are visualized and normal. There is no significant stenosis or vascular injury to the vertebral arteries in the neck. The right vertebral artery is slightly dominant to the left. The PICA origins are visualized bilaterally. The vertebrobasilar junction is intact.  Skeleton: Mild endplate degenerative change in uncovertebral spurring is present at C5-6 bilaterally with osseous foraminal narrowing is  slightly worse a left. No focal lytic or blastic lesions are present. Fluid is present in the inferior right mastoid air cells. There is fluid or soft tissue throughout the right external auditory canal and the right middle ear cavity is well.  Other neck: Limited imaging the brain is unremarkable. The globes and orbits are intact. Salivary glands are within normal limits. No focal mucosal lesions are present. The thyroid is unremarkable. The lung apices are clear.  IMPRESSION: 1. 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 2. The right internal carotid artery is narrowed to 1.9 mm. This represents the 65% stenosis relative to the more distal vessel at 5.5 mm. 3. The vertebral arteries are intact bilaterally. 4. Degenerative changes of the cervical spine are most evident at C5-6 with mild osseous foraminal narrowing bilaterally.   Electronically Signed   By: San Morelle M.D.   On: 09/25/2014 18:36   Based on my review of this patient's CTA, he has obvious occlusion of LICA from takeoff on, I see no evidence of retrograde filling.  R ICA stenosis is <80%  Laboratory: CBC:    Component Value Date/Time   WBC 10.3 09/27/2014 0555   RBC 4.70 09/27/2014 0555   HGB 14.4 09/27/2014 0555   HCT 42.6 09/27/2014 0555   PLT 200 09/27/2014 0555   MCV 90.6 09/27/2014 0555   MCH 30.6 09/27/2014 0555   MCHC 33.8 09/27/2014 0555   RDW 12.4 09/27/2014 0555   LYMPHSABS 1.9 09/25/2014 0400   MONOABS 1.1* 09/25/2014 0400   EOSABS 0.0 09/25/2014 0400   BASOSABS 0.0 09/25/2014 0400    BMP:    Component Value Date/Time   NA 138 09/27/2014 0555   K 4.2 09/27/2014 0555   CL 105 09/27/2014 0555   CO2 26 09/27/2014 0555   GLUCOSE 100* 09/27/2014 0555   BUN 12 09/27/2014 0555   CREATININE 1.01 09/27/2014 0555   CALCIUM 8.7* 09/27/2014 0555   GFRNONAA >60 09/27/2014 0555   GFRAA >60 09/27/2014 0555    Coagulation: Lab Results  Component Value Date   INR 1.17 09/24/2014   No  results found for: PTT  Lipids:    Component Value Date/Time   CHOL 156 09/25/2014 0400   TRIG 117 09/25/2014 0400   HDL 31* 09/25/2014 0400   CHOLHDL 5.0 09/25/2014 0400   VLDL  23 09/25/2014 0400   LDLCALC 102* 09/25/2014 0400    Medical Decision Making  ANTOLIN BELSITO is a 59 y.o. male who presents with: L ICA occlusion likely causing L CVA with dense neurologic deficits, asx R ICA stenosis <80%, atrial fibrillation   No role for surgical intervention in this patient.  Maximal medical management as outlined in Neurology's note.  The patient is currently on a statin: Lipitor.  The patient is currently on an anti-platelet: ASA.  Based on the patient's vascular studies and examination, I have offered the patient: routine surveillance of the R ICA stenosis in 6 months.  Thank you for allowing [place] to participate in this patient's care.  Adele Barthel, MD [facility] Office: [phone] Pager: [phone]  09/27/2014, 1:51 PM

## 2014-09-28 ENCOUNTER — Other Ambulatory Visit: Payer: Self-pay | Admitting: *Deleted

## 2014-09-28 DIAGNOSIS — I6529 Occlusion and stenosis of unspecified carotid artery: Secondary | ICD-10-CM | POA: Insufficient documentation

## 2014-09-28 DIAGNOSIS — I69320 Aphasia following cerebral infarction: Secondary | ICD-10-CM

## 2014-09-28 DIAGNOSIS — I639 Cerebral infarction, unspecified: Secondary | ICD-10-CM

## 2014-09-28 DIAGNOSIS — I6523 Occlusion and stenosis of bilateral carotid arteries: Secondary | ICD-10-CM

## 2014-09-28 DIAGNOSIS — I6522 Occlusion and stenosis of left carotid artery: Secondary | ICD-10-CM

## 2014-09-28 DIAGNOSIS — I63512 Cerebral infarction due to unspecified occlusion or stenosis of left middle cerebral artery: Secondary | ICD-10-CM | POA: Insufficient documentation

## 2014-09-28 DIAGNOSIS — I6521 Occlusion and stenosis of right carotid artery: Secondary | ICD-10-CM

## 2014-09-28 DIAGNOSIS — I480 Paroxysmal atrial fibrillation: Secondary | ICD-10-CM

## 2014-09-28 MED ORDER — APIXABAN 5 MG PO TABS
5.0000 mg | ORAL_TABLET | Freq: Two times a day (BID) | ORAL | Status: DC
  Administered 2014-09-28 – 2014-09-29 (×3): 5 mg via ORAL
  Filled 2014-09-28 (×3): qty 1

## 2014-09-28 MED ORDER — METOPROLOL TARTRATE 12.5 MG HALF TABLET
12.5000 mg | ORAL_TABLET | Freq: Two times a day (BID) | ORAL | Status: DC
  Administered 2014-09-28 (×2): 12.5 mg via ORAL
  Filled 2014-09-28 (×2): qty 1

## 2014-09-28 NOTE — Progress Notes (Signed)
STROKE TEAM PROGRESS NOTE   HISTORY [name] is an 59 y.o. male with a history of atrial fibrillation not on anticoagulation, basal cell carcinoma and nephrolithiasis, was brought to the emergency room for further evaluation after exhibiting prolonged confusion following surgical procedure earlier today. He underwent right tympanoplasty. He was last known well at 7 AM today. At the end of the procedure he was noted to be confused and having difficulty with being able to communicate verbally, as well as follow commands. Change in mental status was initially thought to be related to anesthesia and sedating medications. With no improvement over time he was referred for further evaluation. CT scan of his head showed an acute moderate size left history temporal and parietal ischemic infarction with mild mass effect. He has no history of TIA or stroke. His wife is uncertain of aspirin was discontinued during the week prior to his surgical procedure. NIH stroke score was 9.  LSN: 7 AM on 09/24/2014 tPA Given: No: Moderate-sized stroke demonstrated on CT; be on time window for treatment consideration mRankin:  SUBJECTIVE (INTERVAL HISTORY) His wife and son were at the bedside.  Overall he cannot express how feels his condition is getting better, however still has significant aphasia. He has been started on eliquis 5 mg twice a day today.   OBJECTIVE Temp:  [97.8 F (36.6 C)-99.1 F (37.3 C)] 97.8 F (36.6 C) (08/01 1804) Pulse Rate:  [51-67] 58 (08/01 1804) Cardiac Rhythm:  [-] Atrial fibrillation;Sinus bradycardia (07/31 2015) Resp:  [18-20] 18 (08/01 1804) BP: (120-138)/(61-84) 120/67 mmHg (08/01 1804) SpO2:  [95 %-100 %] 100 % (08/01 1804)   Recent Labs Lab 09/26/14 1131 09/26/14 1649 09/26/14 2136 09/27/14 0643 09/27/14 2101  GLUCAP 99 89 138* 99 78    Recent Labs Lab 09/24/14 2227 09/24/14 2235 09/25/14 0400 09/26/14 0221 09/27/14 0555  NA 140 142 139 138 138  K  4.0 4.0 3.9 4.0 4.2  CL 107 104 105 107 105  CO2 24  --  26 24 26   GLUCOSE 115* 118* 108* 95 100*  BUN 9 12 10 10 12   CREATININE 0.96 0.80 0.95 0.95 1.01  CALCIUM 9.4  --  8.7* 8.6* 8.7*    Recent Labs Lab 09/24/14 2227 09/25/14 0400  AST 27 23  ALT 27 22  ALKPHOS 61 53  BILITOT 1.0 0.9  PROT 7.3 6.1*  ALBUMIN 4.2 3.5    Recent Labs Lab 09/24/14 2227 09/24/14 2235 09/25/14 0400 09/26/14 0221 09/27/14 0555  WBC 13.5*  --  14.0* 11.1* 10.3  NEUTROABS 11.4*  --  11.0*  --   --   HGB 14.8 16.0 13.5 13.9 14.4  HCT 42.2 47.0 40.2 41.1 42.6  MCV 88.8  --  90.5 90.3 90.6  PLT 231  --  201 189 200   No results for input(s): CKTOTAL, CKMB, CKMBINDEX, TROPONINI in the last 168 hours. No results for input(s): LABPROT, INR in the last 72 hours. No results for input(s): COLORURINE, LABSPEC, [place], GLUCOSEU, HGBUR, BILIRUBINUR, KETONESUR, PROTEINUR, UROBILINOGEN, NITRITE, LEUKOCYTESUR in the last 72 hours.  Invalid input(s): APPERANCEUR     Component Value Date/Time   CHOL 156 09/25/2014 0400   TRIG 117 09/25/2014 0400   HDL 31* 09/25/2014 0400   CHOLHDL 5.0 09/25/2014 0400   VLDL 23 09/25/2014 0400   LDLCALC 102* 09/25/2014 0400   Lab Results  Component Value Date   HGBA1C 5.9* 09/25/2014   No results found for: LABOPIA, COCAINSCRNUR, [place], [place], THCU,  LABBARB  No results for input(s): ETH in the last 168 hours.   Imaging  Dg Chest 2 View  09/25/2014    Mild right basilar atelectasis. Interstitial thickening, this appears chronic.     Ct Head Wo Contrast 09/24/2014    Acute nonhemorrhagic infarction in the posterior left temporal lobe and left parietal lobe. Mild mass effect with sulcal effacement but no midline shift.   Ct Angio Neck W/cm &/or Wo/cm 09/25/2014    1. 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  2. The right internal carotid artery is narrowed to 1.9 mm. This represents the 65% stenosis relative to the more distal  vessel at 5.5 mm.  3. The vertebral arteries are intact bilaterally.  4. Degenerative changes of the cervical spine are most evident at C5-6 with mild osseous foraminal narrowing bilaterally.     Mr Brain Wo Contrast 09/25/2014     MRI HEAD:  Acute large LEFT middle cerebral artery territory infarct without hemorrhagic conversion.  Tubular susceptibility artifact LEFT cerebrum most compatible with thromboembolism distal LEFT MCA segment.  Mild white matter changes compatible with chronic small vessel ischemic disease.    MRA HEAD:  LEFT internal carotid artery occlusion, recommend angiographic imaging in neck for further assessment.  Slow flow within LEFT middle cerebral artery, retrograde filling via LEFT PCOM. Complete circle of Willis. Mild LEFT MCA luminal irregularity which suggests atherosclerosis.     CUS - Right: 40-59% ICA stenosis. Left: ICA is occluded. Bilateral: Vertebral artery flow is antegrade.   2D echo - - Left ventricle: The cavity size was normal. There was mild concentric hypertrophy. Systolic function was normal. The estimated ejection fraction was in the range of 60% to 65%. Wall motion was normal; there were no regional wall motion abnormalities. Left ventricular diastolic function parameters were normal. Lateral annulus E velocity: 11.4 cm/s. Medial annulus E velocity: 9.68 cm/s. - Aorta: Aortic root at upper normal limits of size. Aortic root dimension: 39 mm (ED). - Mitral valve: There was mild regurgitation. - Systemic veins: IVC dilated with normal respiratory variation. Estimated CVP 8 mmHg.  PHYSICAL EXAM HEENT- Normocephalic, no lesions, without obvious abnormality. Normal external eye and conjunctiva. Normal TM's bilaterally. Normal auditory canals and external ears. Normal external nose, mucus membranes and septum. Normal pharynx. Neck supple with no masses, nodes, nodules or enlargement. Cardiovascular - regular rate and  rhythm, S1, S2 normal, no murmur, click, rub or gallop Lungs - chest clear, no wheezing, rales, normal symmetric air entry Abdomen - soft, non-tender; bowel sounds normal; no masses, no organomegaly Extremities - no joint deformities, effusion, or inflammation, no edema and no skin discoloration  Neurologic Examination: Mental Status: Alert, severe receptive aphasia as well as essentially word salad speech output. Able to follow commands with cues. There was neglect of his right side. Cranial Nerves: II-dense right homonymous hemianopsia. III/IV/VI-Pupils were equal and reacted normally to light. Gaze preference to the left side with no movement of eyes afterwards beyond midline.  VII-moderate right lower facial weakness. VIII-normal. X-mild dysarthria. XI: trapezius strength/neck flexion strength normal bilaterally XII-midline tongue extension with normal strength. Motor: No drift of upper nor lower extremities with equal strength bilaterally in upper and lower extremities; flaccid muscle tone throughout. Sensory: Neglect of right side. Deep Tendon Reflexes: 2+ and symmetric. Plantars: Mute bilaterally Cerebellar: Unable to assess due to language barrier with severe receptive aphasia. Carotid auscultation: Normal   ASSESSMENT/PLAN Mr. RAYAAN GARGUILO is a 59 y.o. male with  history of atrial fibrillation not on anticoagulation, basal cell carcinoma, and nephrolithiasis presenting with prolonged confusion with difficulty communicating and following commands. He did not receive IV t-PA due to late presentation and moderate sized stroke.  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 Etiology embolic, source unclear - acute L ICA occlusion (doubt) vs chronic L ICA occlusion with ? Hypotension during OR vsdue to atrial fibrillation.   Resultant  Global aphasia  MRI  Acute large LEFT middle cerebral artery territory  infarct.  MRA  LEFT internal carotid artery occlusion,   CTA - left ICA occlusion and right ICA 65% stenosis  Carotid Doppler - left ICA occlusion and right ICA 40-59% stenosis  2D Echo unremarkable  LDL 102  HgbA1c 5.9  SCDs for VTE prophylaxis Diet Heart Room service appropriate?: Yes; Fluid consistency:: Thin  aspirin 81 mg orally every day prior to admission, now on Eliquis 5mg  bid  Patient counseled to be compliant with his antithrombotic medications  Ongoing aggressive stroke risk factor management  Therapy recommendations:  pending  Disposition: Pending  Left ICA occlusion and right carotid stenosis  Vascular surgery consulted  No procedure due to right ICA less than 80% stenosis Patient will follow up with vascular surgery as outpatient to monitor right ICA stenosis  Atrial fibrillation  On metoprolol for rate control  Started Eliquis 5 mg twice a day today  Patient so far tolerating well  Hypertension  Home meds: Toprol 25 mg twice daily (no doubt also used for atrial fibrillation rate control)  Blood pressure 120-140  Due to left ICA occlusion and right carotid stenosis, recommend blood pressure goal 130-150.   Hyperlipidemia  Home meds: No lipid lowering medications prior to admission  LDL 102, goal < 70  Now on Lipitor 40 mg daily.  Continue statin at discharge  Other Stroke Risk Factors  Advanced age  ETOH use   Hospital day # 3   Neurology will sign off. Please call with questions. Pt will follow up with Dr. Erlinda Hong at [facility] in about 2 months. Thanks for the consult.  Matthew Hawking, MD PhD Stroke Neurology 09/28/2014 6:52 PM   To contact Stroke Continuity provider, please refer to [email or web address] After hours, contact General Neurology

## 2014-09-28 NOTE — H&P (Signed)
Physical Medicine and Rehabilitation Admission H&P    Chief Complaint  Patient presents with  . Altered Mental Status  . Extremity Weakness  : HPI: [name] is a 59 y.o. right handed male with history of basal cell carcinoma, atrial fibrillation maintained on aspirin 81 mg daily. Independent prior to admission living with his wife and active. Admitted 09/25/2014. Patient had undergone right ear tympanoplasty. Noted post procedure to have altered mental status and difficulty communicating. 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 MRI of the brain shows left middle cerebral artery territory infarct. 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 Patient did not receive TPA. Echocardiogram with ejection fraction of 65% no wall motion abnormalities. Carotid Dopplers with left ICA occlusion and right 40-59% ICA stenosis. CT angiogram of the neck again with left carotid occlusion and 65% right ICA stenosis. Neurology services consulted presently on Eliquis  for CVA prophylaxis. Tolerating a regular diet. Vascular surgery consulted Dr. Bridgett Larsson follow-up outpatient consideration for right carotid surgery. Physical and occupational therapy evaluations completed with recommendations of physical medicine rehabilitation consult. Patient was admitted for comprehensive rehabilitation program  ROS Review of Systems  Constitutional: Negative for fever and chills.  HENT: Positive for hearing loss.  Respiratory: Negative for cough and shortness of breath.  Cardiovascular: Positive for palpitations. Negative for chest pain.  Gastrointestinal: Positive for constipation. Negative for heartburn, vomiting and abdominal pain.  Genitourinary: Positive for urgency. Negative for hematuria.  Musculoskeletal: Positive for myalgias. Negative for back pain and neck pain.  Skin: Negative for rash.  Neurological: Negative for  headaches    Past Medical History  Diagnosis Date  . Atrial fibrillation   . Basal cell carcinoma   . Nephrolithiasis    Past Surgical History  Procedure Laterality Date  . Lithotripsy    . Vasectomy     Family History  Problem Relation Age of Onset  . Cancer Mother   . Heart failure Father    Social History:  reports that he has never smoked. He does not have any smokeless tobacco history on file. He reports that he drinks alcohol. He reports that he does not use illicit drugs. Allergies: No Known Allergies Medications Prior to Admission  Medication Sig Dispense Refill  . aspirin 81 MG tablet Take 81 mg by mouth daily.    . metoprolol tartrate (LOPRESSOR) 25 MG tablet Take 0.5 tablets (12.5 mg total) by mouth 2 (two) times daily. 90 tablet 1    Home: Home Living Family/patient expects to be discharged to:: Private residence Living Arrangements: Spouse/significant other Type of Home: House Home Access: Stairs to enter Technical brewer of Steps: 2 Entrance Stairs-Rails: Right Home Equipment: None   Functional History: Prior Function Level of Independence: Independent Comments: works for [employer]; plays tennis/active  Functional Status:  Mobility: Bed Mobility Overal bed mobility: Needs Assistance Bed Mobility: Supine to Sit Supine to sit: Supervision General bed mobility comments: pt able to transfer self but required supervision due to impulsivity and safety Transfers Overall transfer level: Needs assistance Equipment used: None Transfers: Sit to/from Stand Sit to Stand: Min assist Stand pivot transfers: Min assist General transfer comment: v/c's for safe hand placement, minA to steady patient during overall sway laterally and anterior/posterior Ambulation/Gait Ambulation/Gait assistance: Mod assist, Max assist, +2 physical assistance Ambulation Distance (Feet): 150 Feet Assistive device: 2 person hand held assist Gait Pattern/deviations:  Step-through pattern, Staggering left, Staggering  right, Ataxic General Gait Details: pt with strong push to the Right, pt with R sided neglect/inattention. pt unable to maintain midline position and had constant R lateral lean but also was swaying laterally and had trunk instability. pt would exagerate stepping also. Gait velocity interpretation:  (impulsively fast)    ADL: ADL Overall ADL's : Needs assistance/impaired Eating/Feeding: Minimal assistance, Sitting Grooming: Oral care, Minimal assistance, Sitting, Wash/dry hands, Wash/dry face Upper Body Bathing: Sitting, Minimal assitance Lower Body Bathing: Moderate assistance, Sit to/from stand Upper Body Dressing : Minimal assistance, Sitting Lower Body Dressing: Sit to/from stand, Minimal assistance, Moderate assistance Toilet Transfer: Minimal assistance, Stand-pivot (from bed to chair) Functional mobility during ADLs: Minimal assistance General ADL Comments: Pt requiring several verbal and visual cues in session. Pt at first not understanding that OT wanted him to doff socks, but he was able to do it later in session.  Cognition: Cognition Overall Cognitive Status: Impaired/Different from baseline Arousal/Alertness: Awake/alert Orientation Level: Other (comment) (global aphasia) Attention: Selective Selective Attention: Appears intact Awareness:  (aware of language deficits- frustrated) Cognition Arousal/Alertness: Awake/alert Behavior During Therapy: Impulsive Overall Cognitive Status: Impaired/Different from baseline Area of Impairment: Following commands, Attention, Memory, Safety/judgement, Problem solving (unable to report orientation due to aphasia) Current Attention Level: Focused Memory: Decreased short-term memory Following Commands: Follows one step commands inconsistently (most likely due to inability to comprehend) Safety/Judgement: Decreased awareness of safety, Decreased awareness of deficits Problem Solving:  Slow processing, Difficulty sequencing, Requires verbal cues, Requires tactile cues General Comments: pt with global aphasia however did have appropriate word finding and response 25% of time. ie. pt said "thank you" "you are helping me" at end of session but was unable to report children or wife's name. pt showed signs of frustration and emotional Difficult to assess due to: Impaired communication  Physical Exam: Blood pressure 126/82, pulse 67, temperature 98.1 F (36.7 C), temperature source Oral, resp. rate 20, height 5' 10"  (1.778 m), weight 98.8 kg (217 lb 13 oz), SpO2 100 %. Physical Exam Constitutional: He appears well-developed.  HENT: dentition fair to good, oral mucosa pink and moist Head: Normocephalic.  Eyes: EOM are normal.  Neck: Normal range of motion. Neck supple. No thyromegaly present.  Cardiovascular:  Cardiac rate control without murmur Respiratory: Effort normal and breath sounds normal. No respiratory distress. No wheezes or rales GI: Soft. Bowel sounds are normal. He exhibits no distension.  Neurological: He is alert. No focal CN abnl.  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 He would follow some simple one-step demonstrate commands but inconsistently. MMT: Right--- deltoid, biceps, triceps, wrist, HI, HF, KE, ADF, APF 4 to 4+/5 with decreased [place]. LUE and LLE 5/5 prox to distal. Sensory intact to paint and gross touch in all 4. dtr's 2+. Perseverates on certain tasks and phrases----needs substantial cueing to shift from one task to another--somewhat improved from yesterday.  Skin: Skin is warm and dry. Sutures removed from for head lesion excision site clean and dry  Psychiatric: He has a normal mood and affect. His behavior is normal    Results for orders placed or performed during the hospital encounter of 09/24/14 (from the past 48 hour(s))  Glucose, capillary     Status: None   Collection Time: 09/26/14 11:31 AM  Result Value Ref  Range   Glucose-Capillary 99 65 - 99 mg/dL   Comment 1 Document in Chart   Glucose, capillary     Status: None   Collection Time: 09/26/14  4:49 PM  Result Value Ref Range   Glucose-Capillary 89 65 - 99 mg/dL  Glucose, capillary     Status: Abnormal   Collection Time: 09/26/14  9:36 PM  Result Value Ref Range   Glucose-Capillary 138 (H) 65 - 99 mg/dL   Comment 1 Notify RN    Comment 2 Document in Chart   CBC     Status: None   Collection Time: 09/27/14  5:55 AM  Result Value Ref Range   WBC 10.3 4.0 - 10.5 K/uL   RBC 4.70 4.22 - 5.81 MIL/uL   Hemoglobin 14.4 13.0 - 17.0 g/dL   HCT 42.6 39.0 - 52.0 %   MCV 90.6 78.0 - 100.0 fL   MCH 30.6 26.0 - 34.0 pg   MCHC 33.8 30.0 - 36.0 g/dL   RDW 12.4 11.5 - 15.5 %   Platelets 200 150 - 400 K/uL  Basic metabolic panel     Status: Abnormal   Collection Time: 09/27/14  5:55 AM  Result Value Ref Range   Sodium 138 135 - 145 mmol/L   Potassium 4.2 3.5 - 5.1 mmol/L   Chloride 105 101 - 111 mmol/L   CO2 26 22 - 32 mmol/L   Glucose, Bld 100 (H) 65 - 99 mg/dL   BUN 12 6 - 20 mg/dL   Creatinine, Ser 1.01 0.61 - 1.24 mg/dL   Calcium 8.7 (L) 8.9 - 10.3 mg/dL   GFR calc non Af Amer >60 >60 mL/min   GFR calc Af Amer >60 >60 mL/min    Comment: (NOTE) The eGFR has been calculated using the CKD EPI equation. This calculation has not been validated in all clinical situations. eGFR's persistently <60 mL/min signify possible Chronic Kidney Disease.    Anion gap 7 5 - 15  Glucose, capillary     Status: None   Collection Time: 09/27/14  6:43 AM  Result Value Ref Range   Glucose-Capillary 99 65 - 99 mg/dL   Comment 1 Notify RN    Comment 2 Document in Chart   Glucose, capillary     Status: None   Collection Time: 09/27/14  9:01 PM  Result Value Ref Range   Glucose-Capillary 78 65 - 99 mg/dL   Comment 1 Notify RN    Comment 2 Document in Chart    No results found.     Medical Problem List and Plan: 1. Functional deficits secondary to  embolic left MCA infarct (?etiology) with expressive receptive aphasia after right ear tympanoplasty 09/24/2014 2.  DVT Prophylaxis/Anticoagulation: Eliquis required due to afib. Monitor for a bleeding episodes.  3. Pain Management: Tylenol as needed. No pain at present 4. Hypertension/atrial fibrillation. Lopressor 12.5 mg twice a day. Cardiac rate controlled and regular currently 5. Neuropsych: This patient is not capable of making decisions on his own behalf. 6. Skin/Wound Care: Routine skin checks on admit and throughout stay 7. Fluids/Electrolytes/Nutrition: Routine I&O with follow-up chemistries. Encourage po 8. Hyperlipidemia. Lipitor for secondary stroke prevention 9. Right ICA stenosis. Follow-up outpatient with vascular surgery. Left ICA occluded    Post Admission Physician Evaluation: 1. Functional deficits secondary  to likely embolic left MCA infarct. 2. Patient is admitted to receive collaborative, interdisciplinary care between the physiatrist, rehab nursing staff, and therapy team. 3. Patient's level of medical complexity and substantial therapy needs in context of that medical necessity cannot be provided at a lesser intensity of care such as a SNF. 4. Patient has experienced substantial functional loss from his/her baseline which was documented above  under the "Functional History" and "Functional Status" headings.  Judging by the patient's diagnosis, physical exam, and functional history, the patient has potential for functional progress which will result in measurable gains while on inpatient rehab.  These gains will be of substantial and practical use upon discharge  in facilitating mobility and self-care at the household level. 5. Physiatrist will provide 24 hour management of medical needs as well as oversight of the therapy plan/treatment and provide guidance as appropriate regarding the interaction of the two. 6. 24 hour rehab nursing will assist with bladder management,  bowel management, safety, skin/wound care, disease management, medication administration, pain management and patient education  and help integrate therapy concepts, techniques,education, etc. 7. PT will assess and treat for/with: Lower extremity strength, range of motion, stamina, balance, functional mobility, safety, adaptive techniques and equipment, NMR, cognitive-linguistic impact on gait/safety/mobility, family ed.   Goals are: supervision to mod I. 8. OT will assess and treat for/with: ADL's, functional mobility, safety, upper extremity strength, adaptive techniques and equipment, NMR, cognitive-linguistic impact on gait, safety.   Goals are: supervision to mod I. Therapy may proceed with showering this patient. 9. SLP will assess and treat for/with: language, communication, cognition.  Goals are: supervision to min assist. 10. Case Management and Social Worker will assess and treat for psychological issues and discharge planning. 11. Team conference will be held weekly to assess progress toward goals and to determine barriers to discharge. 12. Patient will receive at least 3 hours of therapy per day at least 5 days per week. 13. ELOS: 7-10 days       14. Prognosis:  excellent     Meredith Staggers, MD, [place] [facility] 09/29/2014

## 2014-09-28 NOTE — Progress Notes (Signed)
59 y.o. 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 CIR beginning interview and approval process. Lives at home with spouse and should be excellent candidate. No further CM needs at present. Will follow for disposition.

## 2014-09-28 NOTE — Consult Note (Signed)
Physical Medicine and Rehabilitation Consult Reason for Consult: Left MCA infarct Referring Physician: [facility]   HPI: [name] is a 59 y.o. right handed male with history of basal cell carcinoma, atrial fibrillation maintained on aspirin 81 mg daily. Independent prior to admission living with his wife and active. Admitted 09/25/2014. Patient had undergone right ear tympanoplasty. Noted post procedure to have altered mental status and difficulty communicating. 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 MRI of the brain shows left middle cerebral artery territory infarct. 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 Patient did not receive TPA. Echocardiogram with ejection fraction of 65% no wall motion abnormalities. Carotid Dopplers with left ICA occlusion and right 40-59% ICA stenosis. CT angiogram of the neck again with left carotid occlusion and 65% right ICA stenosis. Neurology services consulted presently on aspirin 325 mg daily for CVA prophylaxis. Tolerating a regular diet. Vascular surgery consulted Dr. Bridgett Larsson follow-up outpatient consideration for right carotid surgery. Physical and occupational therapy evaluations completed with recommendations of physical medicine rehabilitation consult.   Review of Systems  Constitutional: Negative for fever and chills.  HENT: Positive for hearing loss.   Respiratory: Negative for cough and shortness of breath.   Cardiovascular: Positive for palpitations. Negative for chest pain.  Gastrointestinal: Positive for constipation. Negative for heartburn, vomiting and abdominal pain.  Genitourinary: Positive for urgency. Negative for hematuria.  Musculoskeletal: Positive for myalgias. Negative for back pain and neck pain.  Skin: Negative for rash.  Neurological: Negative for headaches.   Past Medical History  Diagnosis Date  . Atrial fibrillation   . Basal cell  carcinoma   . Nephrolithiasis    Past Surgical History  Procedure Laterality Date  . Lithotripsy    . Vasectomy     Family History  Problem Relation Age of Onset  . Cancer Mother   . Heart failure Father    Social History:  reports that he has never smoked. He does not have any smokeless tobacco history on file. He reports that he drinks alcohol. He reports that he does not use illicit drugs. Allergies: No Known Allergies Medications Prior to Admission  Medication Sig Dispense Refill  . aspirin 81 MG tablet Take 81 mg by mouth daily.    . metoprolol tartrate (LOPRESSOR) 25 MG tablet Take 0.5 tablets (12.5 mg total) by mouth 2 (two) times daily. 90 tablet 1    Home: Home Living Family/patient expects to be discharged to:: Private residence Living Arrangements: Spouse/significant other Type of Home: House Home Access: Stairs to enter Technical brewer of Steps: 2 Entrance Stairs-Rails: Right Home Equipment: None  Functional History: Prior Function Level of Independence: Independent Comments: works for [employer]; plays tennis/active Functional Status:  Mobility: Bed Mobility Overal bed mobility: Needs Assistance Bed Mobility: Supine to Sit Supine to sit: Supervision General bed mobility comments: pt went to use RUE to stabilize himself on railing and hand slipped off rail with + LOB  Transfers Overall transfer level: Needs assistance Equipment used: None Transfers: Sit to/from Stand, Stand Pivot Transfers Sit to Stand: Min assist, Min guard Stand pivot transfers: Min assist General transfer comment: cues for hand placement and assist for pt to sit down.  Ambulation/Gait Ambulation/Gait assistance: Min assist Ambulation Distance (Feet): 4 Feet Assistive device: None Gait Pattern/deviations: Step-through pattern, Decreased stride length, Drifts right/left (decr placement/coordination RLE) General Gait Details: der balance due to poor placement RLE Gait  velocity interpretation: Below normal speed for age/gender    ADL: ADL Overall ADL's : Needs assistance/impaired Eating/Feeding: Minimal assistance, Sitting Grooming: Oral care, Minimal assistance, Sitting, Wash/dry hands, Wash/dry face Upper Body Bathing: Sitting, Minimal assitance Lower Body Bathing: Moderate assistance, Sit to/from stand Upper Body Dressing : Minimal assistance, Sitting Lower Body Dressing: Sit to/from stand, Minimal assistance, Moderate assistance Toilet Transfer: Minimal assistance, Stand-pivot (from bed to chair) Functional mobility during ADLs: Minimal assistance General ADL Comments: Pt requiring several verbal and visual cues in session. Pt at first not understanding that OT wanted him to doff socks, but he was able to do it later in session.  Cognition: Cognition Overall Cognitive Status: Difficult to assess Arousal/Alertness: Awake/alert Orientation Level: Other (comment) (global aphasia) Attention: Selective Selective Attention: Appears intact Awareness:  (aware of language deficits- frustrated) Cognition Arousal/Alertness: Awake/alert Behavior During Therapy: WFL for tasks assessed/performed Overall Cognitive Status: Difficult to assess Difficult to assess due to: Impaired communication  Blood pressure 138/71, pulse 51, temperature 98.6 F (37 C), temperature source Oral, resp. rate 18, height 5\' 10"  (1.778 m), weight 98.8 kg (217 lb 13 oz), SpO2 95 %. Physical Exam  Constitutional: He appears well-developed.  HENT:  Head: Normocephalic.  Eyes: EOM are normal.  Neck: Normal range of motion. Neck supple. No thyromegaly present.  Cardiovascular:  Cardiac rate control  Respiratory: Effort normal and breath sounds normal. No respiratory distress.  GI: Soft. Bowel sounds are normal. He exhibits no distension.  Neurological: He is alert.  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 He would follow some simple one-step  demonstrate commands but inconsistently. Mild weakness, RUE and RLE, 4 to 4+/5 with decreased [place]. LUE and LLE 5/5. Perseverates on certain tasks and phrases----needs substantial cueing to shift from one task to another.   Skin: Skin is warm and dry.  Psychiatric: He has a normal mood and affect. His behavior is normal.    Results for orders placed or performed during the hospital encounter of 09/24/14 (from the past 24 hour(s))  CBC     Status: None   Collection Time: 09/27/14  5:55 AM  Result Value Ref Range   WBC 10.3 4.0 - 10.5 K/uL   RBC 4.70 4.22 - 5.81 MIL/uL   Hemoglobin 14.4 13.0 - 17.0 g/dL   HCT 42.6 39.0 - 52.0 %   MCV 90.6 78.0 - 100.0 fL   MCH 30.6 26.0 - 34.0 pg   MCHC 33.8 30.0 - 36.0 g/dL   RDW 12.4 11.5 - 15.5 %   Platelets 200 150 - 400 K/uL  Basic metabolic panel     Status: Abnormal   Collection Time: 09/27/14  5:55 AM  Result Value Ref Range   Sodium 138 135 - 145 mmol/L   Potassium 4.2 3.5 - 5.1 mmol/L   Chloride 105 101 - 111 mmol/L   CO2 26 22 - 32 mmol/L   Glucose, Bld 100 (H) 65 - 99 mg/dL   BUN 12 6 - 20 mg/dL   Creatinine, Ser 1.01 0.61 - 1.24 mg/dL   Calcium 8.7 (L) 8.9 - 10.3 mg/dL   GFR calc non Af Amer >60 >60 mL/min   GFR calc Af Amer >60 >60 mL/min   Anion gap 7 5 - 15  Glucose, capillary     Status: None   Collection Time: 09/27/14  6:43 AM  Result Value Ref Range   Glucose-Capillary 99 65 - 99 mg/dL   Comment 1 Notify RN    Comment  2 Document in Chart   Glucose, capillary     Status: None   Collection Time: 09/27/14  9:01 PM  Result Value Ref Range   Glucose-Capillary 78 65 - 99 mg/dL   Comment 1 Notify RN    Comment 2 Document in Chart    No results found.  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 Does the need for close, 24 hr/day medical supervision in concert with the patient's rehab needs make it unreasonable for this patient to be  served in a less intensive setting? Yes 2. Co-Morbidities requiring supervision/potential complications: afib, post-stroke sequelae 3. Due to bladder management, bowel management, safety, skin/wound care, disease management, medication administration, pain management and patient education, does the patient require 24 hr/day rehab nursing? Yes 4. Does the patient require coordinated care of a physician, rehab nurse, PT (1-2 hrs/day, 5 days/week), OT (1-2 hrs/day, 5 days/week) and SLP (1-2 hrs/day, 5 days/week) to address physical and functional deficits in the context of the above medical diagnosis(es)? Yes Addressing deficits in the following areas: balance, endurance, locomotion, strength, transferring, bowel/bladder control, bathing, dressing, feeding, grooming, toileting, cognition, swallowing and psychosocial support 5. Can the patient actively participate in an intensive therapy program of at least 3 hrs of therapy per day at least 5 days per week? Yes 6. The potential for patient to make measurable gains while on inpatient rehab is excellent 7. Anticipated functional outcomes upon discharge from inpatient rehab are modified independent and supervision  with PT, modified independent and supervision with OT, min assist with SLP. 8. Estimated rehab length of stay to reach the above functional goals is: 7-10 days 9. Does the patient have adequate social supports and living environment to accommodate these discharge functional goals? Yes 10. Anticipated D/C setting: Home 11. Anticipated post D/C treatments: HH therapy and Outpatient therapy 12. Overall Rehab/Functional Prognosis: excellent  RECOMMENDATIONS: This patient's condition is appropriate for continued rehabilitative care in the following setting: CIR Patient has agreed to participate in recommended program. Yes Note that insurance prior authorization may be required for reimbursement for recommended care.  Comment: Rehab Admissions  Coordinator to follow up.  Thanks,  Meredith Staggers, MD, Mellody Drown     09/28/2014

## 2014-09-28 NOTE — Progress Notes (Signed)
Inpatient Rehabilitation  We received a prescreen request for possible IP Rehab. A rehab consult is pending.  The admissions coordinator will follow up as appropriate when the consult is completed.  Thank you,  Gerlean Ren PT Inpatient Rehab Admissions Coordinator Cell [phone] Office [phone]

## 2014-09-28 NOTE — Progress Notes (Signed)
Physical Therapy Treatment Patient Details Name: [name] MRN: [id number] DOB: [birth year] Today's Date: 09/28/2014    History of Present Illness Adm 7/28 with Lt MCA CVA; found to have Lt ICA occlusion and Rt ICA 65% stenosis PMHx-PAF, Rt ear tympanoplasty surgery 09/23/14 (awoke from surgery with CVA symptoms)    PT Comments    Pt con't to present with global aphasia however was speaking appropriately <25% of the time. Pt also with R sided neglect or inattention, impulsivity, decreased motor planning and sequencing during gait requiring mod/maxAx2 for safe ambulation. Con't to recommend CIR upon d/c to address above deficits and prepare pt for safe d/c home.   Follow Up Recommendations  CIR     Equipment Recommendations   (TBD)    Recommendations for Other Services Rehab consult     Precautions / Restrictions Precautions Precautions: Fall Precaution Comments: global aphasia, impulsive, R sided neglect Restrictions Weight Bearing Restrictions: No    Mobility  Bed Mobility Overal bed mobility: Needs Assistance Bed Mobility: Supine to Sit     Supine to sit: Supervision     General bed mobility comments: pt able to transfer self but required supervision due to impulsivity and safety  Transfers Overall transfer level: Needs assistance Equipment used: None Transfers: Sit to/from Stand Sit to Stand: Min assist         General transfer comment: v/c's for safe hand placement, minA to steady patient during overall sway laterally and anterior/posterior  Ambulation/Gait Ambulation/Gait assistance: Mod assist;Max assist;+2 physical assistance Ambulation Distance (Feet): 150 Feet Assistive device: 2 person hand held assist Gait Pattern/deviations: Step-through pattern;Staggering left;Staggering right;Ataxic   Gait velocity interpretation:  (impulsively fast) General Gait Details: pt with strong push to the Right, pt with R sided neglect/inattention. pt unable to  maintain midline position and had constant R lateral lean but also was swaying laterally and had trunk instability. pt would exagerate stepping also.   Stairs            Wheelchair Mobility    Modified Rankin (Stroke Patients Only) Modified Rankin (Stroke Patients Only) Pre-Morbid Rankin Score: No symptoms Modified Rankin: Moderately severe disability     Balance Overall balance assessment: Needs assistance Sitting-balance support: No upper extremity supported;Feet supported Sitting balance-Leahy Scale: Fair     Standing balance support: Bilateral upper extremity supported Standing balance-Leahy Scale: Poor Standing balance comment: pt unsteady                    Cognition Arousal/Alertness: Awake/alert Behavior During Therapy: Impulsive Overall Cognitive Status: Impaired/Different from baseline Area of Impairment: Following commands;Attention;Memory;Safety/judgement;Problem solving (unable to report orientation due to aphasia)   Current Attention Level: Focused Memory: Decreased short-term memory Following Commands: Follows one step commands inconsistently (most likely due to inability to comprehend) Safety/Judgement: Decreased awareness of safety;Decreased awareness of deficits   Problem Solving: Slow processing;Difficulty sequencing;Requires verbal cues;Requires tactile cues General Comments: pt with global aphasia however did have appropriate word finding and response 25% of time. ie. pt said "thank you" "you are helping me" at end of session but was unable to report children or wife's name. pt showed signs of frustration and emotional    Exercises      General Comments General comments (skin integrity, edema, etc.): attempted to have patient show PT a certain number of fingers ie. show me 2 fingers. pt unable to comprehend and repeated "what do you want me to do?" "4, 4, 4"  Pt required hand over hand assist to  achieve task      Pertinent Vitals/Pain  Pain Assessment: No/denies pain (doesn't appear in pain)    Home Living                      Prior Function            PT Goals (current goals can now be found in the care plan section) Acute Rehab PT Goals Patient Stated Goal: not stated Progress towards PT goals: Progressing toward goals    Frequency  Min 4X/week    PT Plan Current plan remains appropriate    Co-evaluation             End of Session Equipment Utilized During Treatment: Gait belt Activity Tolerance: Patient tolerated treatment well Patient left: in chair;with call bell/phone within reach;with chair alarm set     Time: [phone] PT Time Calculation (min) (ACUTE ONLY): 24 min  Charges:  $Gait Training: 8-22 mins $Therapeutic Activity: 8-22 mins                    G Codes:      Kingsley Callander 09/28/2014, 10:45 AM   Kittie Plater, PT, DPT Pager #: [phone] Office #: [phone]

## 2014-09-28 NOTE — Progress Notes (Signed)
PHARMACIST - PHYSICIAN COMMUNICATION  CONCERNING:  Cefazolin   RECOMMENDATION: Consider stopping?  DESCRIPTION: Started on Cefazolin 7/29 following surgery - now Day # 4-- need to continue?  Thank you. Anette Guarneri, PharmD [phone]

## 2014-09-28 NOTE — Discharge Instructions (Addendum)
Information on my medicine - ELIQUIS (apixaban)  This medication education was reviewed with me or my healthcare representative as part of my discharge preparation.  The pharmacist that spoke with me during my hospital stay was:  Tad Moore, [facility]  Why was Eliquis prescribed for you? Eliquis was prescribed for you to reduce the risk of a blood clot forming that can cause a stroke if you have a medical condition called atrial fibrillation (a type of irregular heartbeat).  What do You need to know about Eliquis ? Take your Eliquis TWICE DAILY - one tablet in the morning and one tablet in the evening with or without food. If you have difficulty swallowing the tablet whole please discuss with your pharmacist how to take the medication safely.  Take Eliquis exactly as prescribed by your doctor and DO NOT stop taking Eliquis without talking to the doctor who prescribed the medication.  Stopping may increase your risk of developing a stroke.  Refill your prescription before you run out.  After discharge, you should have regular check-up appointments with your healthcare provider that is prescribing your Eliquis.  In the future your dose may need to be changed if your kidney function or weight changes by a significant amount or as you get older.  What do you do if you miss a dose? If you miss a dose, take it as soon as you remember on the same day and resume taking twice daily.  Do not take more than one dose of ELIQUIS at the same time to make up a missed dose.  Important Safety Information A possible side effect of Eliquis is bleeding. You should call your healthcare provider right away if you experience any of the following: ? Bleeding from an injury or your nose that does not stop. ? Unusual colored urine (red or dark brown) or unusual colored stools (red or black). ? Unusual bruising for unknown reasons. ? A serious fall or if you hit your head (even if there is no bleeding).  Some  medicines may interact with Eliquis and might increase your risk of bleeding or clotting while on Eliquis. To help avoid this, consult your healthcare provider or pharmacist prior to using any new prescription or non-prescription medications, including herbals, vitamins, non-steroidal anti-inflammatory drugs (NSAIDs) and supplements.  This website has more information on Eliquis (apixaban): http://www.eliquis.com/eliquis/home  Ischemic Stroke A stroke (cerebrovascular accident) is the sudden death of brain tissue. It is a medical emergency. A stroke can cause permanent loss of brain function. This can cause problems with different parts of your body. A transient ischemic attack (TIA) is different because it does not cause permanent damage. A TIA is a short-lived problem of poor blood flow affecting a part of the brain. A TIA is also a serious problem because having a TIA greatly increases the chances of having a stroke. When symptoms first develop, you cannot know if the problem might be a stroke or a TIA. CAUSES  A stroke is caused by a decrease of oxygen supply to an area of your brain. It is usually the result of a small blood clot or collection of cholesterol or fat (plaque) that blocks blood flow in the brain. A stroke can also be caused by blocked or damaged carotid arteries.  RISK FACTORS  High blood pressure (hypertension).  High cholesterol.  Diabetes mellitus.  Heart disease.  The buildup of plaque in the blood vessels (peripheral artery disease or atherosclerosis).  The buildup of plaque in the  blood vessels providing blood and oxygen to the brain (carotid artery stenosis).  An abnormal heart rhythm (atrial fibrillation).  Obesity.  Smoking.  Taking oral contraceptives (especially in combination with smoking).  Physical inactivity.  A diet high in fats, salt (sodium), and calories.  Alcohol use.  Use of illegal drugs (especially cocaine and methamphetamine).  Being  African American.  Being over the age of 47.  Family history of stroke.  Previous history of blood clots, stroke, TIA, or heart attack.  Sickle cell disease. SYMPTOMS  These symptoms usually develop suddenly, or may be newly present upon awakening from sleep:  Sudden weakness or numbness of the face, arm, or leg, especially on one side of the body.  Sudden trouble walking or difficulty moving arms or legs.  Sudden confusion.  Sudden personality changes.  Trouble speaking (aphasia) or understanding.  Difficulty swallowing.  Sudden trouble seeing in one or both eyes.  Double vision.  Dizziness.  Loss of balance or coordination.  Sudden severe headache with no known cause.  Trouble reading or writing. DIAGNOSIS  Your health care provider can often determine the presence or absence of a stroke based on your symptoms, history, and physical exam. Computed tomography (CT) of the brain is usually performed to confirm the stroke, determine causes, and determine stroke severity. Other tests may be done to find the cause of the stroke. These tests may include:  Electrocardiography.  Continuous heart monitoring.  Echocardiography.  Carotid ultrasonography.  Magnetic resonance imaging (MRI).  A scan of the brain circulation.  Blood tests. PREVENTION  The risk of a stroke can be decreased by appropriately treating high blood pressure, high cholesterol, diabetes, heart disease, and obesity and by quitting smoking, limiting alcohol, and staying physically active. TREATMENT  Time is of the essence. It is important to seek treatment at the first sign of these symptoms because you may receive a medicine to dissolve the clot (thrombolytic) that cannot be given if too much time has passed since your symptoms began. Even if you do not know when your symptoms began, get treatment as soon as possible as there are other treatment options available including oxygen, intravenous (IV)  fluids, and medicines to thin the blood (anticoagulants). Treatment of stroke depends on the duration, severity, and cause of your symptoms. Medicines and dietary changes may be used to address diabetes, high blood pressure, and other risk factors. Physical, speech, and occupational therapists will assess you and work with you to improve any functions impaired by the stroke. Measures will be taken to prevent short-term and long-term complications, including infection from breathing foreign material into the lungs (aspiration pneumonia), blood clots in the legs, bedsores, and falls. Rarely, surgery may be needed to remove large blood clots or to open up blocked arteries. HOME CARE INSTRUCTIONS   Take medicines only as directed by your health care provider. Follow the directions carefully. Medicines may be used to control risk factors for a stroke. Be sure you understand all your medicine instructions.  You may be told to take a medicine to thin the blood, such as aspirin or the anticoagulant warfarin. Warfarin needs to be taken exactly as instructed.  Too much and too little warfarin are both dangerous. Too much warfarin increases the risk of bleeding. Too little warfarin continues to allow the risk for blood clots. While taking warfarin, you will need to have regular blood tests to measure your blood clotting time. These blood tests usually include both the PT and INR tests. The  PT and INR results allow your health care provider to adjust your dose of warfarin. The dose can change for many reasons. It is critically important that you take warfarin exactly as prescribed, and that you have your PT and INR levels drawn exactly as directed.  Many foods, especially foods high in vitamin K, can interfere with warfarin and affect the PT and INR results. Foods high in vitamin K include spinach, kale, broccoli, cabbage, collard and turnip greens, brussels sprouts, peas, cauliflower, seaweed, and parsley, as well as  beef and pork liver, green tea, and soybean oil. You should eat a consistent amount of foods high in vitamin K. Avoid major changes in your diet, or notify your health care provider before changing your diet. Arrange a visit with a dietitian to answer your questions.  Many medicines can interfere with warfarin and affect the PT and INR results. You must tell your health care provider about any and all medicines you take. This includes all vitamins and supplements. Be especially cautious with aspirin and anti-inflammatory medicines. Do not take or discontinue any prescribed or over-the-counter medicine except on the advice of your health care provider or pharmacist.  Warfarin can have side effects, such as excessive bruising or bleeding. You will need to hold pressure over cuts for longer than usual. Your health care provider or pharmacist will discuss other potential side effects.  Avoid sports or activities that may cause injury or bleeding.  Be mindful when shaving, flossing your teeth, or handling sharp objects.  Alcohol can change the body's ability to handle warfarin. It is best to avoid alcoholic drinks or consume only very small amounts while taking warfarin. Notify your health care provider if you change your alcohol intake.  Notify your dentist or other health care providers before procedures.  If swallow studies have determined that your swallowing reflex is present, you should eat healthy foods. Including 5 or more servings of fruits and vegetables a day may reduce the risk of stroke. Foods may need to be a certain consistency (soft or pureed), or small bites may need to be taken in order to avoid aspirating or choking. Certain dietary changes may be advised to address high blood pressure, high cholesterol, diabetes, or obesity.  Food choices that are low in sodium, saturated fat, trans fat, and cholesterol are recommended to manage high blood pressure.  Food choies that are high in  fiber, and low in saturated fat, trans fat, and cholesterol may control cholesterol levels.  Controlling carbohydrates and sugar intake is recommended to manage diabetes.  Reducing calorie intake and making food choices that are low in sodium, saturated fat, trans fat, and cholesterol are recommended to manage obesity.  Maintain a healthy weight.  Stay physically active. It is recommended that you get at least 30 minutes of activity on all or most days.  Do not use any tobacco products including cigarettes, chewing tobacco, or electronic cigarettes.  Limit alcohol use even if you are not taking warfarin. Moderate alcohol use is considered to be:  No more than 2 drinks each day for men.  No more than 1 drink each day for nonpregnant women.  Home safety. A safe home environment is important to reduce the risk of falls. Your health care provider may arrange for specialists to evaluate your home. Having grab bars in the bedroom and bathroom is often important. Your health care provider may arrange for equipment to be used at home, such as raised toilets and a seat for  the shower.  Physical, occupational, and speech therapy. Ongoing therapy may be needed to maximize your recovery after a stroke. If you have been advised to use a walker or a cane, use it at all times. Be sure to keep your therapy appointments.  Follow all instructions for follow-up with your health care provider. This is very important. This includes any referrals, physical therapy, rehabilitation, and lab tests. Proper follow-up can prevent another stroke from occurring. SEEK MEDICAL CARE IF:  You have personality changes.  You have difficulty swallowing.  You are seeing double.  You have dizziness.  You have a fever.  You have skin breakdown. SEEK IMMEDIATE MEDICAL CARE IF:  Any of these symptoms may represent a serious problem that is an emergency. Do not wait to see if the symptoms will go away. Get medical help  right away. Call your local emergency services (911 in [place] Do not drive yourself to the hospital.  You have sudden weakness or numbness of the face, arm, or leg, especially on one side of the body.  You have sudden trouble walking or difficulty moving arms or legs.  You have sudden confusion.  You have trouble speaking (aphasia) or understanding.  You have sudden trouble seeing in one or both eyes.  You have a loss of balance or coordination.  You have a sudden, severe headache with no known cause.  You have new chest pain or an irregular heartbeat.  You have a partial or total loss of consciousness. Document Released: 02/13/2005 Document Revised: 06/30/2013 Document Reviewed: 09/24/2011 [facility] Patient Information 2015 [place], [place]. This information is not intended to replace advice given to you by your health care provider. Make sure you discuss any questions you have with your health care provider.

## 2014-09-28 NOTE — Progress Notes (Signed)
I met with pt and his wife at bedside to begin discussions concerning an inpt rehab admission. Both are in agreement and would like admission. I begin [employer] authorization and am hopeful for approval by tomorrow. [phone]

## 2014-09-28 NOTE — Progress Notes (Signed)
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 He also had a left forearm skin lesion removed. He was under anesthesia for prolonged duration. There was difficulty in waking him up and he was confused. He was monitored for an extended period of time. Subsequently he was transferred to the emergency department. Evaluation revealed an acute stroke.  Past medical history:  Past Medical History  Diagnosis Date  . Atrial fibrillation   . Basal cell carcinoma   . Nephrolithiasis     Consultants: Neurology. Rehabilitation, M.D., vascular surgery, Dr. Bridgett Larsson  Procedures:  2-D echocardiogram  Study Conclusions - Left ventricle: The cavity size was normal. There was mildconcentric hypertrophy. Systolic function was normal. Theestimated ejection fraction was in the range of 60% to 65%. Wallmotion was normal; there were no regional wall motionabnormalities. Left ventricular diastolic function parameterswere normal. Lateral annulus E velocity: 11.4 cm/s. Medialannulus E velocity: 9.68 cm/s. - Aorta: Aortic root at upper normal limits of size. Aortic rootdimension: 39 mm (ED). - Mitral valve: There was mild regurgitation. - Systemic veins: IVC dilated with normal respiratory variation.Estimated CVP 8 mmHg.  Carotid Doppler Right: 40-59% ICA stenosis. Left: ICA is occluded. Bilateral: Vertebral artery flow is antegrade.   Antibiotics: Started on Ancef 7/29 per ENT  Subjective: Patient able to communicate more than yesterday but still has significant expressive aphasia.   Objective: Vital Signs  Filed Vitals:   09/27/14 2137 09/27/14 2153 09/28/14 0209  09/28/14 0653  BP: 131/84  138/71 131/78  Pulse: 60 58 51 62  Temp: 99.1 F (37.3 C)  98.6 F (37 C) 98.3 F (36.8 C)  TempSrc: Oral  Oral Oral  Resp: 18  18 18   Height:      Weight:      SpO2: 100%  95% 100%    Intake/Output Summary (Last 24 hours) at 09/28/14 0757 Last data filed at 09/28/14 0500  Gross per 24 hour  Intake      0 ml  Output    700 ml  Net   -700 ml   Filed Weights   09/25/14 0745 09/26/14 1927  Weight: 92.7 kg (204 lb 5.9 oz) 98.8 kg (217 lb 13 oz)    General appearance: alert, appears stated age, distracted and no distress Resp: clear to auscultation bilaterally Cardio: regular rate and rhythm, S1, S2 normal, no murmur, click, rub or gallop GI: soft, non-tender; bowel sounds normal; no masses,  no organomegaly Neurologic: Movement of all his extremities noted. No facial droop. He does have significant aphasia.  Lab Results:  Basic Metabolic Panel:  Recent Labs Lab 09/24/14 2227 09/24/14 2235 09/25/14 0400 09/26/14 0221 09/27/14 0555  NA 140 142 139 138 138  K 4.0 4.0 3.9 4.0 4.2  CL 107 104 105 107 105  CO2 24  --  26 24 26   GLUCOSE 115* 118* 108* 95 100*  BUN 9 12 10 10 12   CREATININE 0.96 0.80 0.95 0.95 1.01  CALCIUM 9.4  --  8.7* 8.6* 8.7*   Liver Function Tests:  Recent Labs Lab 09/24/14 2227 09/25/14 0400  AST 27 23  ALT 27 22  ALKPHOS 61 53  BILITOT 1.0 0.9  PROT 7.3 6.1*  ALBUMIN 4.2 3.5   CBC:  Recent Labs Lab 09/24/14 2227 09/24/14 2235 09/25/14 0400 09/26/14 0221 09/27/14 0555  WBC 13.5*  --  14.0* 11.1* 10.3  NEUTROABS 11.4*  --  11.0*  --   --   HGB 14.8 16.0 13.5 13.9 14.4  HCT 42.2 47.0 40.2 41.1 42.6  MCV 88.8  --  90.5 90.3 90.6  PLT 231  --  201 189 200    CBG:  Recent Labs Lab 09/26/14 1131 09/26/14 1649 09/26/14 2136 09/27/14 0643 09/27/14 2101  GLUCAP 99 89 138* 99 78    Recent Results (from the past 240 hour(s))  MRSA PCR Screening     Status: None   Collection Time: 09/25/14  7:12  AM  Result Value Ref Range Status   MRSA by PCR NEGATIVE NEGATIVE Final    Comment:        The GeneXpert MRSA Assay (FDA approved for NASAL specimens only), is one component of a comprehensive MRSA colonization surveillance program. It is not intended to diagnose MRSA infection nor to guide or monitor treatment for MRSA infections.       Studies/Results: No results found.  Medications:  Scheduled: .  stroke: mapping our early stages of recovery book   Does not apply Once  . atorvastatin  40 mg Oral q1800  .  ceFAZolin (ANCEF) IV  1 g Intravenous 3 times per day  . metoprolol tartrate  12.5 mg Oral BID   Continuous:   PRN:  Assessment/Plan:  Principal Problem:   Stroke Active Problems:   PAF (paroxysmal atrial fibrillation)   Acute CVA (cerebrovascular accident)    Acute stroke involving left MCA territory Neurology is following. Carotid Doppler suggests occlusion of the left ICA. Complete occlusion noted on CT scan as well. Right ICA disease noted. Vascular surgery consulted. No plans for any intervention currently. Considering his history of atrial fibrillation he will need anticoagulation. Start Eliquis. Stop aspirin. PT and OT is recommending CIR. Speech therapist has seen the patient. He is on a diet. Statin has been initiated. LDL 102. HbA1c is 5.9. Echocardiogram is as above. Will likely need inpatient rehabilitation.  Carotid artery disease Complete occlusion of the left ICA. 65% occlusion, right ICA. Vascular surgery consulted. They do not plan any intervention currently. Outpatient management.  Paroxysmal atrial fibrillation Patient going in and out of atrial fibrillation. Currently sinus rhythm. Heart rate between 50s and 60s. Changed over to oral beta blocker. Seen by cardiology recently in March. Apparently his initial episode of atrial fibrillation was in the setting of surgery few years ago. He was on anticoagulation, but then apparently stopped it on its  own. Did not follow-up with cardiology till recently. He underwent a stress test and a echocardiogram in March. Stress test did not show any ischemia. It appears that an event monitor was utilized, which revealed episodes of paroxysmal A. fib. It does not appear that he has had a chance to follow-up with the cardiologist regarding these findings. In view of his stroke, he would need to be on anticoagulation. This has been initiated. Outpatient follow-up with cardiology.  Status post right ear procedure 7/28 Seen by Dr. Lucia Gaskins with ENT. He recommends Ancef for 5 days. This was initiated on July 29. Today is fourth day. No surgical contraindication to initiating anticoagulation or aspirin.  DVT Prophylaxis: SCDs    Code Status: Full code  Family Communication: No family at bedside  Disposition Plan: CIR consult is pending. He will likely need some form of rehabilitation at the time  of discharge.    LOS: 3 days   [facility] Pager [phone] 09/28/2014, 7:57 AM  If 7PM-7AM, please contact night-coverage at [email or web address], password [facility]

## 2014-09-29 ENCOUNTER — Encounter (HOSPITAL_COMMUNITY): Payer: Self-pay | Admitting: *Deleted

## 2014-09-29 ENCOUNTER — Inpatient Hospital Stay (HOSPITAL_COMMUNITY)
Admission: RE | Admit: 2014-09-29 | Discharge: 2014-10-09 | DRG: 057 | Disposition: A | Discharge status: Home / Self Care | Payer: BLUE CROSS/BLUE SHIELD | Source: Intra-hospital | Attending: Physical Medicine & Rehabilitation | Admitting: Physical Medicine & Rehabilitation

## 2014-09-29 ENCOUNTER — Telehealth: Payer: Self-pay | Admitting: Vascular Surgery

## 2014-09-29 DIAGNOSIS — I6523 Occlusion and stenosis of bilateral carotid arteries: Secondary | ICD-10-CM | POA: Diagnosis not present

## 2014-09-29 DIAGNOSIS — R0602 Shortness of breath: Secondary | ICD-10-CM

## 2014-09-29 DIAGNOSIS — IMO0002 Reserved for concepts with insufficient information to code with codable children: Secondary | ICD-10-CM

## 2014-09-29 DIAGNOSIS — I6931 Cognitive deficits following cerebral infarction: Secondary | ICD-10-CM | POA: Diagnosis not present

## 2014-09-29 DIAGNOSIS — I10 Essential (primary) hypertension: Secondary | ICD-10-CM

## 2014-09-29 DIAGNOSIS — Z7982 Long term (current) use of aspirin: Secondary | ICD-10-CM

## 2014-09-29 DIAGNOSIS — E785 Hyperlipidemia, unspecified: Secondary | ICD-10-CM

## 2014-09-29 DIAGNOSIS — I69320 Aphasia following cerebral infarction: Principal | ICD-10-CM

## 2014-09-29 DIAGNOSIS — I6521 Occlusion and stenosis of right carotid artery: Secondary | ICD-10-CM | POA: Diagnosis not present

## 2014-09-29 DIAGNOSIS — I6602 Occlusion and stenosis of left middle cerebral artery: Secondary | ICD-10-CM

## 2014-09-29 DIAGNOSIS — Z79899 Other long term (current) drug therapy: Secondary | ICD-10-CM

## 2014-09-29 DIAGNOSIS — K5900 Constipation, unspecified: Secondary | ICD-10-CM

## 2014-09-29 DIAGNOSIS — I4891 Unspecified atrial fibrillation: Secondary | ICD-10-CM

## 2014-09-29 DIAGNOSIS — I4819 Other persistent atrial fibrillation: Secondary | ICD-10-CM | POA: Diagnosis present

## 2014-09-29 DIAGNOSIS — I63512 Cerebral infarction due to unspecified occlusion or stenosis of left middle cerebral artery: Secondary | ICD-10-CM

## 2014-09-29 DIAGNOSIS — I69390 Apraxia following cerebral infarction: Secondary | ICD-10-CM

## 2014-09-29 DIAGNOSIS — Z85828 Personal history of other malignant neoplasm of skin: Secondary | ICD-10-CM | POA: Diagnosis not present

## 2014-09-29 DIAGNOSIS — I6529 Occlusion and stenosis of unspecified carotid artery: Secondary | ICD-10-CM | POA: Diagnosis present

## 2014-09-29 DIAGNOSIS — I480 Paroxysmal atrial fibrillation: Secondary | ICD-10-CM | POA: Diagnosis not present

## 2014-09-29 DIAGNOSIS — I669 Occlusion and stenosis of unspecified cerebral artery: Secondary | ICD-10-CM | POA: Diagnosis not present

## 2014-09-29 HISTORY — DX: Cerebral infarction due to unspecified occlusion or stenosis of left middle cerebral artery: I63.512

## 2014-09-29 HISTORY — DX: Reserved for concepts with insufficient information to code with codable children: IMO0002

## 2014-09-29 MED ORDER — ONDANSETRON HCL 4 MG/2ML IJ SOLN: 4.0000 mg | Freq: Four times a day (QID) | INTRAMUSCULAR | Status: DC | PRN

## 2014-09-29 MED ORDER — METOPROLOL TARTRATE 12.5 MG HALF TABLET
12.5000 mg | ORAL_TABLET | Freq: Every day | ORAL | Status: DC
  Administered 2014-09-29: 12.5 mg via ORAL
  Filled 2014-09-29: qty 1

## 2014-09-29 MED ORDER — ACETAMINOPHEN 325 MG PO TABS
650.0000 mg | ORAL_TABLET | Freq: Four times a day (QID) | ORAL | Status: DC | PRN
  Administered 2014-09-29 – 2014-10-08 (×10): 650 mg via ORAL
  Filled 2014-09-29 (×11): qty 2

## 2014-09-29 MED ORDER — ONDANSETRON HCL 4 MG PO TABS
4.0000 mg | ORAL_TABLET | Freq: Four times a day (QID) | ORAL | Status: DC | PRN
  Administered 2014-09-30 – 2014-10-01 (×2): 4 mg via ORAL
  Filled 2014-09-29 (×2): qty 1

## 2014-09-29 MED ORDER — METOPROLOL TARTRATE 12.5 MG HALF TABLET
12.5000 mg | ORAL_TABLET | Freq: Every day | ORAL | Status: DC
  Administered 2014-09-30 – 2014-10-09 (×10): 12.5 mg via ORAL
  Filled 2014-09-29 (×10): qty 1

## 2014-09-29 MED ORDER — ATORVASTATIN CALCIUM 40 MG PO TABS
40.0000 mg | ORAL_TABLET | Freq: Every day | ORAL | Status: DC
  Administered 2014-09-29 – 2014-10-08 (×10): 40 mg via ORAL
  Filled 2014-09-29 (×9): qty 1

## 2014-09-29 MED ORDER — SORBITOL 70 % SOLN
30.0000 mL | Freq: Every day | Status: DC | PRN
  Administered 2014-10-03 – 2014-10-06 (×3): 30 mL via ORAL
  Filled 2014-09-29 (×3): qty 30

## 2014-09-29 MED ORDER — APIXABAN 5 MG PO TABS
5.0000 mg | ORAL_TABLET | Freq: Two times a day (BID) | ORAL | Status: DC
  Administered 2014-09-29 – 2014-10-09 (×20): 5 mg via ORAL
  Filled 2014-09-29 (×20): qty 1

## 2014-09-29 NOTE — Telephone Encounter (Signed)
LM for pt and mailed letter, dpm

## 2014-09-29 NOTE — PMR Pre-admission (Signed)
PMR Admission Coordinator Pre-Admission Assessment  Patient: [name] is an 59 y.o., male MRN: [id number] DOB: 1955-07-20 Height: 5' 10"  (177.8 cm) Weight: 98.8 kg (217 lb 13 oz)              Insurance Information HMO:     PPO: yes     PCP:      IPA:      80/20:      OTHER:  PRIMARY: [employer] [place]      Policy#: [id number]      Subscriber: pt CM Name: Phyllis Ginger      Phone#: [phone]     Fax#: [phone] Pre-Cert#: [id number]      Employer: approved 8/2- 8/12 update due 8/8 Benefits:  Phone #: [phone]     Name: 09/28/14 Eff. Date: 01/27/14     Deduct: $2000/met      Out of Pocket Max: $4000/met      Life Max: none CIR: 80%      SNF: 80% Outpatient: 80%     Co-Pay: 30 visits combined Home Health: 80%      Co-Pay: no visit limit DME: 80%     Co-Pay: 20% Providers: in network  SECONDARY: none      Policy#:       Subscriber:   Medicaid Application Date:       Case Manager:  Disability Application Date:       Case Worker:  Wife to bring in short term disability paperwork to be completed  Emergency Contact Information Contact Information    Name Relation Home Work Mobile   [name],[name] Spouse [phone]     [name], [name]   [phone]     Current Medical History  Patient Admitting Diagnosis: left MCA infarct  History of Present Illness: [name] is a 59 y.o. right handed male with history of basal cell carcinoma, atrial fibrillation maintained on aspirin 81 mg daily. Independent prior to admission living with his wife and active. Admitted 09/25/2014. Patient had undergone right ear tympanoplasty. Noted post procedure to have altered mental status and difficulty communicating. 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 MRI of the brain shows left middle cerebral artery territory infarct. 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 Patient did not receive TPA.  Echocardiogram with ejection fraction of 65% no wall motion abnormalities. Carotid Dopplers with left ICA occlusion and right 40-59% ICA stenosis. CT angiogram of the neck again with left carotid occlusion and 65% right ICA stenosis. Neurology services consulted presently on Eliquis for CVA prophylaxis. Tolerating a regular diet. Vascular surgery consulted Dr. Bridgett Larsson follow-up outpatient consideration for right carotid surgery.    Total: 8 NIH    Past Medical History  Past Medical History  Diagnosis Date  . Atrial fibrillation   . Basal cell carcinoma   . Nephrolithiasis     Family History  family history includes Cancer in his mother; Heart failure in his father.  Prior Rehab/Hospitalizations:  Has the patient had major surgery during 100 days prior to admission? Yes  Current Medications   Current facility-administered medications:  .   stroke: mapping our early stages of recovery book, , Does not apply, Once, Rise Patience, MD .  acetaminophen (TYLENOL) tablet 650 mg, 650 mg, Oral, Q6H PRN, Bonnielee Haff, MD, 650 mg at 09/29/14 0828 .  apixaban (ELIQUIS) tablet 5 mg, 5 mg, Oral, BID, Bonnielee Haff, MD, 5 mg at 09/29/14  1003 .  atorvastatin (LIPITOR) tablet 40 mg, 40 mg, Oral, q1800, Bonnielee Haff, MD, 40 mg at 09/28/14 1650 .  ceFAZolin (ANCEF) IVPB 1 g/50 mL premix, 1 g, Intravenous, 3 times per day, Bonnielee Haff, MD, 1 g at 09/28/14 2123 .  metoprolol tartrate (LOPRESSOR) tablet 12.5 mg, 12.5 mg, Oral, Daily, Bonnielee Haff, MD, 12.5 mg at 09/29/14 1003 .  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 None of the above  Prior Functional Level Prior Function Level of Independence: Independent Comments: plays tennis and basketball, very active  Self Care: Did the patient need help bathing, dressing, using the toilet or eating?  Independent  Indoor Mobility: Did the patient need assistance with walking from room to room (with or without device)? Independent  Stairs: Did the patient need assistance with internal or external stairs (with or without device)? Independent  Functional Cognition: Did the patient need help planning regular tasks such as shopping or remembering to take medications? Independent  Current Functional Level Cognition  Arousal/Alertness: Awake/alert Overall Cognitive Status: Impaired/Different from baseline Difficult to assess due to: Impaired communication Current Attention Level: Sustained Orientation Level: Oriented to person, Oriented to place, Disoriented to situation, Disoriented to time Following Commands: Follows one step commands inconsistently, Follows one step commands with increased time Safety/Judgement: Decreased awareness of safety, Decreased awareness of deficits (R sided neglect/inattention) General Comments: pt with improved word finding this date however con't to perseverate on "im trying" remains unable to say name or sons or wife's name. however was able to tell me "no" when PT asked if wifes name was Raford Pitcher. and "yes" when I asked if it was sue"  pt remains to require tactile cues/hand over hand assist to complete tasks due to impaired comprehension. However during habitual tasks like going  to the bathroom he was able to sequence properply. Attention: Selective Selective Attention: Appears intact Awareness:  (aware of language deficits- frustrated)    Extremity Assessment (includes Sensation/Coordination)  Upper Extremity Assessment: RUE deficits/detail RUE Deficits / Details: appeared to have coordination difficulties in RUE; strength/sensation difficult to assess due to impaired communication  Lower Extremity Assessment: Defer to PT evaluation RLE Deficits / Details: difficult to manual muscle test due to language impairments/decr following commands; grossly >4/5 throughout RLE Sensation: decreased light touch (?absent; no response to painful stimuli) RLE Coordination: decreased gross motor (likely due to decr sensation)    ADLs  Overall ADL's : Needs assistance/impaired Eating/Feeding: Minimal assistance, Sitting Grooming: Oral care, Minimal assistance, Sitting, Wash/dry hands, Wash/dry face Upper Body Bathing: Sitting, Minimal assitance Lower Body Bathing: Moderate assistance, Sit to/from stand Upper Body Dressing : Minimal assistance, Sitting Lower Body Dressing: Sit to/from stand, Minimal assistance, Moderate assistance Toilet Transfer: Minimal assistance, Stand-pivot (from bed to chair) Functional mobility during ADLs: Minimal assistance General ADL Comments: Pt requiring several verbal and visual cues in session.  Pt at first not understanding that OT wanted him to doff socks, but he was able to do it later in session.    Mobility  Overal bed mobility: Needs Assistance Bed Mobility: Supine to Sit Supine to sit: Supervision General bed mobility comments: pt quick and impulsive, supervision for safety    Transfers  Overall transfer level: Needs assistance Equipment used: None Transfers: Sit to/from Stand Sit to Stand: Min guard Stand pivot transfers: Min assist General transfer comment: v/c's for safe hand placement. pt with poor  self-awareness/prroprioception. pt "stretching" due to being in the bed hwoever demo'd exagerant back extension and marching    Ambulation / Gait / Stairs / Wheelchair Mobility  Ambulation/Gait Ambulation/Gait assistance: Mod assist, Max assist, +2 safety/equipment Ambulation Distance (Feet): 400 Feet Assistive device: 1 person hand held assist, 2 person hand held assist Gait Pattern/deviations: Step-through pattern, Ataxic, Staggering right, Narrow base of support General Gait Details: pt with strong push with R UE. Pt also with R sided neglect running into walls and unable to self correct. max directional v/c's to turn to the right. Pt constantly moving UEs to try to balance UEs. pt with near tandem walking but no cross over. pt very unsteady. Gait velocity interpretation:  (impulsively fast)    Posture / Balance Balance Overall balance assessment: Needs assistance Sitting-balance support: No upper extremity supported, Feet supported Sitting balance-Leahy Scale: Fair Standing balance support: No upper extremity supported, During functional activity Standing balance-Leahy Scale: Poor Standing balance comment: pt washed hands at sink, leaned on sink. required v/c's to find paper towels and soap due to being on R side. pt constantly moving UEs to maintain balance    Special needs/care consideration Bowel mgmt: continent Bladder mgmt: continent    Previous Home Environment Living Arrangements: Spouse/significant other  Lives With: Spouse Available Help at Discharge: Family, Available 24 hours/day, Other (Comment) (wife would have to take FMLA, works at [employer]) Type of Home: [place]: Two level, 1/2 bath on main level, Bed/bath upstairs, Other (Comment) (has two level home and also full basement that has full apar) Home Access: Stairs to enter Entrance Stairs-Rails: Right Entrance Stairs-Number of Steps: 2 Bathroom Shower/Tub: Tub/shower unit, Walk-in shower, Other (comment)  (upstairs has both walk in shower and tub/shower combination) Bathroom Toilet: Standard Bathroom Accessibility: Yes How Accessible: Accessible via walker [place]: No  Discharge Living Setting Plans for Discharge Living Setting: Patient's home, Lives with (comment), Other (Comment) (wife) Type of Home at Discharge: House Discharge Home Layout: Two level, 1/2 bath on main level, Other (Comment) (has full basement with apartment and full bath. otherwise tw) Discharge Home Access: Stairs to enter Entrance Stairs-Rails: Right Entrance Stairs-Number of Steps: 2 Discharge Bathroom Shower/Tub: Tub/shower unit, Walk-in shower, Other (comment) (upstairs has both) Discharge Bathroom Toilet: Standard Discharge Bathroom Accessibility: Yes How Accessible: Accessible via walker Does the patient have any problems obtaining your medications?: No  Social/Family/Support Systems Patient Roles: Spouse, Parent, Other (Comment) (two adult sons, also works fulltime) Sport and exercise psychologist Information: Manuela Schwartz, wife Anticipated Caregiver: wife will have to take FMLA Anticipated Caregiver's Contact Information: see above Ability/Limitations of Caregiver: wife works at [employer], will have to take fmla Caregiver Availability: 24/7 Discharge Plan Discussed with Primary Caregiver: Yes Is Caregiver In Agreement with Plan?: Yes Does Caregiver/Family have Issues with Lodging/Transportation while Pt is in Rehab?: No   Goals/Additional Needs Patient/Family Goal for Rehab: supervision with PT,, OT and supervision to min assist with SLP Expected length of stay: [facility]OS 7-10 days Additional  Information: pt likes to play tennis and basketball, very active Pt/Family Agrees to Admission and willing to participate: Yes Program Orientation Provided & Reviewed with Pt/Caregiver Including Roles  & Responsibilities: Yes   Decrease burden of Care through IP rehab admission: n/a  Possible need for SNF placement upon  discharge:no  Patient Condition: This patient's medical and functional status has changed since the consult dated: 09/28/2014 in which the Rehabilitation Physician determined and documented that the patient's condition is appropriate for intensive rehabilitative care in an inpatient rehabilitation facility. Medical changes are: as above noted.  Functional changes are: mod assist. After evaluating the patient today and speaking with the Rehabilitation physician and acute team, the patient remains appropriate for inpatient rehab. Will admit to inpatient rehab today.  Preadmission Screen Completed By:  Cleatrice Burke, 09/29/2014 1:48 PM ______________________________________________________________________   Discussed status with Dr. Naaman Plummer on 09/29/2014 at 1348 and received telephone approval for admission today.  Admission Coordinator:  Cleatrice Burke, time 1324 Date 09/29/2014

## 2014-09-29 NOTE — Progress Notes (Signed)
Follow up 5 days post surgery and stroke. Stable receiving rehab. 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 Excision site healing nicely. Right ear post auricular sutures should be removed in the next 3-7 days. Will need follow up in my office as an outpatient to remove the right ear canal packing in approximately 3 weeks.

## 2014-09-29 NOTE — Progress Notes (Signed)
Physical Medicine and Rehabilitation Consult Reason for Consult: Left MCA infarct Referring Physician: [facility]   HPI: [name] is a 59 y.o. right handed male with history of basal cell carcinoma, atrial fibrillation maintained on aspirin 81 mg daily. Independent prior to admission living with his wife and active. Admitted 09/25/2014. Patient had undergone right ear tympanoplasty. Noted post procedure to have altered mental status and difficulty communicating. 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 MRI of the brain shows left middle cerebral artery territory infarct. 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 Patient did not receive TPA. Echocardiogram with ejection fraction of 65% no wall motion abnormalities. Carotid Dopplers with left ICA occlusion and right 40-59% ICA stenosis. CT angiogram of the neck again with left carotid occlusion and 65% right ICA stenosis. Neurology services consulted presently on aspirin 325 mg daily for CVA prophylaxis. Tolerating a regular diet. Vascular surgery consulted Dr. Bridgett Larsson follow-up outpatient consideration for right carotid surgery. Physical and occupational therapy evaluations completed with recommendations of physical medicine rehabilitation consult.   Review of Systems  Constitutional: Negative for fever and chills.  HENT: Positive for hearing loss.  Respiratory: Negative for cough and shortness of breath.  Cardiovascular: Positive for palpitations. Negative for chest pain.  Gastrointestinal: Positive for constipation. Negative for heartburn, vomiting and abdominal pain.  Genitourinary: Positive for urgency. Negative for hematuria.  Musculoskeletal: Positive for myalgias. Negative for back pain and neck pain.  Skin: Negative for rash.  Neurological: Negative for headaches.   Past Medical History  Diagnosis Date  . Atrial fibrillation   . Basal cell  carcinoma   . Nephrolithiasis    Past Surgical History  Procedure Laterality Date  . Lithotripsy    . Vasectomy     Family History  Problem Relation Age of Onset  . Cancer Mother   . Heart failure Father    Social History:  reports that he has never smoked. He does not have any smokeless tobacco history on file. He reports that he drinks alcohol. He reports that he does not use illicit drugs. Allergies: No Known Allergies Medications Prior to Admission  Medication Sig Dispense Refill  . aspirin 81 MG tablet Take 81 mg by mouth daily.    . metoprolol tartrate (LOPRESSOR) 25 MG tablet Take 0.5 tablets (12.5 mg total) by mouth 2 (two) times daily. 90 tablet 1    Home: Home Living Family/patient expects to be discharged to:: Private residence Living Arrangements: Spouse/significant other Type of Home: House Home Access: Stairs to enter Technical brewer of Steps: 2 Entrance Stairs-Rails: Right Home Equipment: None  Functional History: Prior Function Level of Independence: Independent Comments: works for [employer]; plays tennis/active Functional Status:  Mobility: Bed Mobility Overal bed mobility: Needs Assistance Bed Mobility: Supine to Sit Supine to sit: Supervision General bed mobility comments: pt went to use RUE to stabilize himself on railing and hand slipped off rail with + LOB  Transfers Overall transfer level: Needs assistance Equipment used: None Transfers: Sit to/from Stand, Stand Pivot Transfers Sit to Stand: Min assist, Min guard Stand pivot transfers: Min assist General transfer comment: cues for hand placement and assist for pt to sit down.  Ambulation/Gait Ambulation/Gait assistance: Min assist Ambulation Distance (Feet): 4 Feet Assistive device: None Gait Pattern/deviations: Step-through pattern, Decreased stride length, Drifts right/left (decr placement/coordination RLE) General Gait Details: der  balance due to poor placement RLE Gait velocity interpretation: Below normal speed  for age/gender    ADL: ADL Overall ADL's : Needs assistance/impaired Eating/Feeding: Minimal assistance, Sitting Grooming: Oral care, Minimal assistance, Sitting, Wash/dry hands, Wash/dry face Upper Body Bathing: Sitting, Minimal assitance Lower Body Bathing: Moderate assistance, Sit to/from stand Upper Body Dressing : Minimal assistance, Sitting Lower Body Dressing: Sit to/from stand, Minimal assistance, Moderate assistance Toilet Transfer: Minimal assistance, Stand-pivot (from bed to chair) Functional mobility during ADLs: Minimal assistance General ADL Comments: Pt requiring several verbal and visual cues in session. Pt at first not understanding that OT wanted him to doff socks, but he was able to do it later in session.  Cognition: Cognition Overall Cognitive Status: Difficult to assess Arousal/Alertness: Awake/alert Orientation Level: Other (comment) (global aphasia) Attention: Selective Selective Attention: Appears intact Awareness: (aware of language deficits- frustrated) Cognition Arousal/Alertness: Awake/alert Behavior During Therapy: WFL for tasks assessed/performed Overall Cognitive Status: Difficult to assess Difficult to assess due to: Impaired communication  Blood pressure 138/71, pulse 51, temperature 98.6 F (37 C), temperature source Oral, resp. rate 18, height 5\' 10"  (1.778 m), weight 98.8 kg (217 lb 13 oz), SpO2 95 %. Physical Exam  Constitutional: He appears well-developed.  HENT:  Head: Normocephalic.  Eyes: EOM are normal.  Neck: Normal range of motion. Neck supple. No thyromegaly present.  Cardiovascular:  Cardiac rate control  Respiratory: Effort normal and breath sounds normal. No respiratory distress.  GI: Soft. Bowel sounds are normal. He exhibits no distension.  Neurological: He is alert.  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 He  would follow some simple one-step demonstrate commands but inconsistently. Mild weakness, RUE and RLE, 4 to 4+/5 with decreased [place]. LUE and LLE 5/5. Perseverates on certain tasks and phrases----needs substantial cueing to shift from one task to another.  Skin: Skin is warm and dry.  Psychiatric: He has a normal mood and affect. His behavior is normal.     Lab Results Last 24 Hours    Results for orders placed or performed during the hospital encounter of 09/24/14 (from the past 24 hour(s))  CBC Status: None   Collection Time: 09/27/14 5:55 AM  Result Value Ref Range   WBC 10.3 4.0 - 10.5 K/uL   RBC 4.70 4.22 - 5.81 MIL/uL   Hemoglobin 14.4 13.0 - 17.0 g/dL   HCT 42.6 39.0 - 52.0 %   MCV 90.6 78.0 - 100.0 fL   MCH 30.6 26.0 - 34.0 pg   MCHC 33.8 30.0 - 36.0 g/dL   RDW 12.4 11.5 - 15.5 %   Platelets 200 150 - 400 K/uL  Basic metabolic panel Status: Abnormal   Collection Time: 09/27/14 5:55 AM  Result Value Ref Range   Sodium 138 135 - 145 mmol/L   Potassium 4.2 3.5 - 5.1 mmol/L   Chloride 105 101 - 111 mmol/L   CO2 26 22 - 32 mmol/L   Glucose, Bld 100 (H) 65 - 99 mg/dL   BUN 12 6 - 20 mg/dL   Creatinine, Ser 1.01 0.61 - 1.24 mg/dL   Calcium 8.7 (L) 8.9 - 10.3 mg/dL   GFR calc non Af Amer >60 >60 mL/min   GFR calc Af Amer >60 >60 mL/min   Anion gap 7 5 - 15  Glucose, capillary Status: None   Collection Time: 09/27/14 6:43 AM  Result Value Ref Range   Glucose-Capillary 99 65 - 99 mg/dL   Comment 1 Notify RN    Comment 2 Document in Chart   Glucose, capillary Status: None   Collection  Time: 09/27/14 9:01 PM  Result Value Ref Range   Glucose-Capillary 78 65 - 99 mg/dL   Comment 1 Notify RN    Comment 2 Document in Chart       Imaging Results (Last 48 hours)    No results found.    Assessment/Plan: Diagnosis: left MCA  infarct with expressive>receptive aphasia which impacts basic mobility and functional tasks 1. Does the need for close, 24 hr/day medical supervision in concert with the patient's rehab needs make it unreasonable for this patient to be served in a less intensive setting? Yes 2. Co-Morbidities requiring supervision/potential complications: afib, post-stroke sequelae 3. Due to bladder management, bowel management, safety, skin/wound care, disease management, medication administration, pain management and patient education, does the patient require 24 hr/day rehab nursing? Yes 4. Does the patient require coordinated care of a physician, rehab nurse, PT (1-2 hrs/day, 5 days/week), OT (1-2 hrs/day, 5 days/week) and SLP (1-2 hrs/day, 5 days/week) to address physical and functional deficits in the context of the above medical diagnosis(es)? Yes Addressing deficits in the following areas: balance, endurance, locomotion, strength, transferring, bowel/bladder control, bathing, dressing, feeding, grooming, toileting, cognition, swallowing and psychosocial support 5. Can the patient actively participate in an intensive therapy program of at least 3 hrs of therapy per day at least 5 days per week? Yes 6. The potential for patient to make measurable gains while on inpatient rehab is excellent 7. Anticipated functional outcomes upon discharge from inpatient rehab are modified independent and supervision with PT, modified independent and supervision with OT, min assist with SLP. 8. Estimated rehab length of stay to reach the above functional goals is: 7-10 days 9. Does the patient have adequate social supports and living environment to accommodate these discharge functional goals? Yes 10. Anticipated D/C setting: Home 11. Anticipated post D/C treatments: HH therapy and Outpatient therapy 12. Overall Rehab/Functional Prognosis: excellent  RECOMMENDATIONS: This patient's condition is appropriate for continued  rehabilitative care in the following setting: CIR Patient has agreed to participate in recommended program. Yes Note that insurance prior authorization may be required for reimbursement for recommended care.  Comment: Rehab Admissions Coordinator to follow up.  Thanks,  Meredith Staggers, MD, [facility]     09/28/2014       Revision History     Date/Time User Provider Type Action   09/28/2014 11:13 AM Meredith Staggers, MD Physician Sign   09/28/2014 6:06 AM Cathlyn Parsons, PA-C Physician Assistant Pend   View Details Report       Routing History     Date/Time From To Method   09/28/2014 11:13 AM Meredith Staggers, MD Meredith Staggers, MD In Basket   09/28/2014 11:13 AM Meredith Staggers, MD Hulan Fess, MD Fax

## 2014-09-29 NOTE — Progress Notes (Signed)
Patient to be transferred to 4MW04. Report given to receiving RN. Family aware of transfer. Receiving RN is aware of patient transfer to floor now

## 2014-09-29 NOTE — Progress Notes (Signed)
I have insurance approval and will admit pt to inpt rehab today. [id number]

## 2014-09-29 NOTE — Telephone Encounter (Signed)
-----   Message from Mena Goes, RN sent at 09/28/2014 11:02 AM EDT ----- Regarding: Schedule   ----- Message -----    From: Dario Ave    Sent: 09/28/2014   6:56 AM      To: Dario Ave, [facility] Charge Pool Subject: Kay's log                                        ----- Message -----    From: Conrad [place], MD    Sent: 09/27/2014   2:07 PM      To: [address]  WESTYN DRIGGERS [phone] 04/27/1955  Level 5? Consult  Follow-up: 6 months Orders: B carotid duplex

## 2014-09-29 NOTE — Discharge Summary (Signed)
[facility]  Physician Discharge Summary   Patient ID: [name] MRN: [id number] DOB/AGE: 1955/04/14 59 y.o.  Admit date: 09/24/2014 Discharge date: 09/29/2014  PCP: Gennette Pac, MD  DISCHARGE DIAGNOSES:  Principal Problem:   Stroke Active Problems:   PAF (paroxysmal atrial fibrillation)   Acute CVA (cerebrovascular accident)   Acute ischemic left MCA stroke   ICAO (internal carotid artery occlusion)   Carotid stenosis  THIS DISCHARGE SUMMARY IS TO BE USED ONLY IF PATIENT IS DISCHARGED TO INPATIENT REHABILITATION.  RECOMMENDATIONS FOR OUTPATIENT FOLLOW UP: 1. Course of Ancef to be completed tonight 2. Please see ENT recommendations regarding staple removal. To be removed in the next 3-7 days.  3. He will need follow-up with Dr. Lucia Gaskins with ENT in 3 weeks 4. He will also benefit from follow-up with Dr. 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 He also had a left forearm skin lesion removed. He was under anesthesia for prolonged duration. There was difficulty in waking him up and he was confused. He was monitored for an extended period of time. Subsequently he was transferred to the emergency department. Evaluation revealed an acute stroke.  Consultants: Neurology. Rehabilitation, M.D., vascular surgery, Dr. Bridgett Larsson  Procedures:  2-D echocardiogram  Study Conclusions - Left ventricle: The cavity size was normal. There was mildconcentric hypertrophy. Systolic function was normal. Theestimated ejection fraction was in the range of 60% to 65%.  Wallmotion was normal; there were no regional wall motionabnormalities. Left ventricular diastolic function parameterswere normal. Lateral annulus E velocity: 11.4 cm/s. Medialannulus E velocity: 9.68 cm/s. - Aorta: Aortic root at upper normal limits of size. Aortic rootdimension: 39 mm (ED). - Mitral valve: There was mild regurgitation. - Systemic veins: IVC dilated with normal respiratory variation.Estimated CVP 8 mmHg.  Carotid Doppler Right: 40-59% ICA stenosis. Left: ICA is occluded. Bilateral: Vertebral artery flow is antegrade.    HOSPITAL COURSE:   Acute stroke involving left MCA territory  Patient was admitted to the hospital. He underwent stroke evaluation. He was seen by neurology. Carotid Doppler suggested occlusion of the left ICA. Complete occlusion noted on CT scan as well. Right ICA disease noted. Vascular surgery consulted. No plans for any intervention currently. Considering his history of atrial fibrillation he will need anticoagulation. Started on Eliquis. PT and OT is recommending CIR. Speech therapist has seen the patient. He is on a diet. Statin has been initiated. LDL 102. HbA1c is 5.9. Echocardiogram is as above. Await admission to inpatient rehabilitation.   Carotid artery disease  Complete occlusion of the left ICA. 65% occlusion, right ICA. Vascular surgery consulted. They do not plan any intervention currently. Outpatient management has been recommended. He will need routine surveillance in 6 months.  Paroxysmal atrial fibrillation  Patient was going in and out of atrial fibrillation. Currently sinus bradycardia. Dose of metoprolol was reduced yesterday. Heart rate noted to be dropping into the 40s and 50s overnight. Will further decrease dose of metoprolol. He has been seen by Dr. Fransico Him in the past. Seen by cardiology recently in March. Apparently his initial episode of atrial fibrillation was  in the setting of surgery few years ago. He was on  anticoagulation, but then apparently stopped it on its own. Did not follow-up with cardiology till recently. He underwent a stress test and a echocardiogram in March. Stress test did not show any ischemia. It appears that an event monitor was utilized, which revealed episodes of paroxysmal A. fib. It does not appear that he has had a chance to follow-up with the cardiologist regarding these findings. In view of his stroke, he would need to be on anticoagulation. This has been initiated. Outpatient follow-up with cardiology in about 2 months.  Status post right ear procedure 7/28  Seen by Dr. Lucia Gaskins with ENT. He recommends Ancef for 5 days. This was initiated on July 29. He will complete his course tonight. No surgical contraindication to initiating anticoagulation or aspirin.   Overall, patient remains stable. He has been able to verbalize more and more. His right-sided neglect seems to be reducing. Expressive aphasia still persists but seems to be improving. Await transfer to inpatient rehabilitation.   PERTINENT LABS:  The results of significant diagnostics from this hospitalization (including imaging, microbiology, ancillary and laboratory) are listed below for reference.    Microbiology: Recent Results (from the past 240 hour(s))  MRSA PCR Screening     Status: None   Collection Time: 09/25/14  7:12 AM  Result Value Ref Range Status   MRSA by PCR NEGATIVE NEGATIVE Final    Comment:        The GeneXpert MRSA Assay (FDA approved for NASAL specimens only), is one component of a comprehensive MRSA colonization surveillance program. It is not intended to diagnose MRSA infection nor to guide or monitor treatment for MRSA infections.      Labs: Basic Metabolic Panel:  Recent Labs Lab 09/24/14 2227 09/24/14 2235 09/25/14 0400 09/26/14 0221 09/27/14 0555  NA 140 142 139 138 138  K 4.0 4.0 3.9 4.0 4.2  CL 107 104 105 107 105  CO2 24  --  26 24 26   GLUCOSE 115* 118* 108* 95 100*   BUN 9 12 10 10 12   CREATININE 0.96 0.80 0.95 0.95 1.01  CALCIUM 9.4  --  8.7* 8.6* 8.7*   Liver Function Tests:  Recent Labs Lab 09/24/14 2227 09/25/14 0400  AST 27 23  ALT 27 22  ALKPHOS 61 53  BILITOT 1.0 0.9  PROT 7.3 6.1*  ALBUMIN 4.2 3.5   CBC:  Recent Labs Lab 09/24/14 2227 09/24/14 2235 09/25/14 0400 09/26/14 0221 09/27/14 0555  WBC 13.5*  --  14.0* 11.1* 10.3  NEUTROABS 11.4*  --  11.0*  --   --   HGB 14.8 16.0 13.5 13.9 14.4  HCT 42.2 47.0 40.2 41.1 42.6  MCV 88.8  --  90.5 90.3 90.6  PLT 231  --  201 189 200   CBG:  Recent Labs Lab 09/26/14 1131 09/26/14 1649 09/26/14 2136 09/27/14 0643 09/27/14 2101  GLUCAP 99 89 138* 99 78     IMAGING STUDIES Dg Chest 2 View  09/25/2014   CLINICAL DATA:  Stroke.  EXAM: CHEST  2 VIEW  COMPARISON:  04/15/2014  FINDINGS: Lower lung volumes from prior exam. Ill-defined right basilar opacity, server atelectasis. Diffuse interstitial thickening. The cardiomediastinal contours are normal. No consolidation, pleural effusion, or pneumothorax. No acute osseous abnormalities are seen.  IMPRESSION: Mild right basilar atelectasis. Interstitial thickening, this appears chronic.   Electronically Signed   By: Jeb Levering M.D.   On: 09/25/2014 05:09  Ct Head Wo Contrast  09/24/2014   CLINICAL DATA:  Confused. Altered mental status. Right leg weakness. Last seen normal 06:50 a.m.  EXAM: CT HEAD WITHOUT CONTRAST  TECHNIQUE: Contiguous axial images were obtained from the base of the skull through the vertex without intravenous contrast.  COMPARISON:  None.  FINDINGS: There is hypodensity consistent with cytotoxic edema in the posterior left temporal lobe and left parietal lobe. There is moderate sulcal effacement and minimal mass effect on the left lateral ventricle. There is no midline shift. Basal cisterns remain patent. There is a hyperdense left MCA. The findings are consistent with an acute nonhemorrhagic infarction. There is  no intracranial hemorrhage. There is no extra-axial fluid collection.  Incidental note is made of fluid or soft tissue in the right external ear and middle ear, presumably related to the recent surgery.  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 Mild mass effect with sulcal effacement but no midline shift.   Electronically Signed   By: Andreas Newport M.D.   On: 09/24/2014 23:23   Ct Angio Neck W/cm &/or Wo/cm  09/25/2014   CLINICAL DATA:  Expressive and receptive a aphasia. Left ICA occlusion.  EXAM: CT ANGIOGRAPHY NECK  TECHNIQUE: Multidetector CT imaging of the neck was performed using the standard protocol during bolus administration of intravenous contrast. Multiplanar CT image reconstructions and MIPs were obtained to evaluate the vascular anatomy. Carotid stenosis measurements (when applicable) are obtained utilizing NASCET criteria, using the distal internal carotid diameter as the denominator.  CONTRAST:  55mL OMNIPAQUE IOHEXOL 350 MG/ML SOLN  COMPARISON:  MRI brain and MRA head from the same day.  FINDINGS: Right carotid system: The visualized right common carotid artery is within normal limits. Extensive plaque is present in the proximal right internal carotid artery. Minimal luminal diameter is 1.9 mm. This compares with a more distal lumen of 5.5 mm. No other significant stenoses are present within the right ICA.  Left carotid system: The visualize left common carotid artery is within normal limits. 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 The PCA branch vessels are visualized. There is no reconstitution of the vessel in the neck.  Vertebral arteries:The vertebral arteries both originate from the subclavian arteries. The vertebral artery origins are visualized and normal. There is no significant stenosis or vascular injury to the vertebral arteries in the neck. The right vertebral artery is slightly dominant to  the left. The PICA origins are visualized bilaterally. The vertebrobasilar junction is intact.  Skeleton: Mild endplate degenerative change in uncovertebral spurring is present at C5-6 bilaterally with osseous foraminal narrowing is slightly worse a left. No focal lytic or blastic lesions are present. Fluid is present in the inferior right mastoid air cells. There is fluid or soft tissue throughout the right external auditory canal and the right middle ear cavity is well.  Other neck: Limited imaging the brain is unremarkable. The globes and orbits are intact. Salivary glands are within normal limits. No focal mucosal lesions are present. The thyroid is unremarkable. The lung apices are clear.  IMPRESSION: 1. 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 2. The right internal carotid artery is narrowed to 1.9 mm. This represents the 65% stenosis relative to the more distal vessel at 5.5 mm. 3. The vertebral arteries are intact bilaterally. 4. Degenerative changes of the cervical spine are most evident at C5-6 with mild osseous foraminal narrowing bilaterally.   Electronically Signed  By: San Morelle M.D.   On: 09/25/2014 18:36   Mr Brain Wo Contrast  09/25/2014   CLINICAL DATA:  Status post RIGHT ear tympanoplasty yesterday, subsequent altered mental status and difficulty speaking. Follow-up stroke. History of atrial fibrillation.  EXAM: MRI HEAD WITHOUT CONTRAST  MRA HEAD WITHOUT CONTRAST  TECHNIQUE: Multiplanar, multiecho pulse sequences of the brain and surrounding structures were obtained without intravenous contrast. Angiographic images of the head were obtained using MRA technique without contrast.  COMPARISON:  CT head September 24, 2014  FINDINGS: MRI HEAD FINDINGS  Punctate focus of reduced diffusion LEFT frontal lobe. In addition, large wedge-like area of reduced diffusion LEFT temporal parietal lobe, corresponding low ADC values, mild FLAIR T2 hyperintense signal. No  susceptibility artifact to suggest hemorrhagic conversion. Tubular susceptibility artifact LEFT sylvian fissure extending to the cortex consistent with thrombosed distal MCA branch.  Ventricles and sulci are otherwise normal for patient's age. A few scattered subcentimeter supratentorial white matter T2 hyperintensities exclusive of the aforementioned abnormality. No midline shift.  No abnormal extra-axial fluid collections. Ocular globes and orbital contents are unremarkable. Paranasal sinuses are well-aerated. RIGHT middle ear and mastoid effusion, soft tissue opacifies the RIGHT external auditory canal. Small LEFT mastoid effusion. No abnormal sellar expansion. No cerebellar tonsillar ectopia. No suspicious calvarial bone marrow signal.  MRA HEAD FINDINGS  Anterior circulation: Complete loss of flow related enhancement within the included LEFT cervical internal carotid artery to the level of the supraclinoid internal carotid artery. Normal flow related enhancement of the included RIGHT cervical, petrous, cavernous and supra clinoid internal carotid arteries. Patent anterior communicating artery. Normal flow related enhancement of the bilateral anterior cerebral arteries. Decreased flow related enhancement of LEFT middle cerebral artery, though vessel appears patent. Decreased number of distal LEFT middle cerebral artery branches. Mild luminal irregularity of the LEFT MCA.  No high-grade stenosis, aneurysm.  Posterior circulation: RIGHT vertebral artery is dominant. Basilar artery is patent, with normal flow related enhancement of the main branch vessels. Fetal origin LEFT posterior cerebral artery, small RIGHT posterior communicating artery present. Normal flow related enhancement of the posterior cerebral arteries.  No large vessel occlusion, high-grade stenosis, abnormal luminal irregularity, aneurysm.  IMPRESSION: MRI HEAD: Acute large LEFT middle cerebral artery territory infarct without hemorrhagic  conversion.  Tubular susceptibility artifact LEFT cerebrum most compatible with thromboembolism distal LEFT MCA segment.  Mild white matter changes compatible with chronic small vessel ischemic disease.  MRA HEAD: LEFT internal carotid artery occlusion, recommend angiographic imaging in neck for further assessment.  Slow flow within LEFT middle cerebral artery, retrograde filling via LEFT PCOM. Complete circle of Willis. Mild LEFT MCA luminal irregularity which suggests atherosclerosis.   Electronically Signed   By: Elon Alas M.D.   On: 09/25/2014 06:58   Mr Jodene Nam Head/brain Wo Cm  09/25/2014   CLINICAL DATA:  Status post RIGHT ear tympanoplasty yesterday, subsequent altered mental status and difficulty speaking. Follow-up stroke. History of atrial fibrillation.  EXAM: MRI HEAD WITHOUT CONTRAST  MRA HEAD WITHOUT CONTRAST  TECHNIQUE: Multiplanar, multiecho pulse sequences of the brain and surrounding structures were obtained without intravenous contrast. Angiographic images of the head were obtained using MRA technique without contrast.  COMPARISON:  CT head September 24, 2014  FINDINGS: MRI HEAD FINDINGS  Punctate focus of reduced diffusion LEFT frontal lobe. In addition, large wedge-like area of reduced diffusion LEFT temporal parietal lobe, corresponding low ADC values, mild FLAIR T2 hyperintense signal. No susceptibility artifact to suggest hemorrhagic conversion. Tubular susceptibility artifact LEFT  sylvian fissure extending to the cortex consistent with thrombosed distal MCA branch.  Ventricles and sulci are otherwise normal for patient's age. A few scattered subcentimeter supratentorial white matter T2 hyperintensities exclusive of the aforementioned abnormality. No midline shift.  No abnormal extra-axial fluid collections. Ocular globes and orbital contents are unremarkable. Paranasal sinuses are well-aerated. RIGHT middle ear and mastoid effusion, soft tissue opacifies the RIGHT external auditory canal.  Small LEFT mastoid effusion. No abnormal sellar expansion. No cerebellar tonsillar ectopia. No suspicious calvarial bone marrow signal.  MRA HEAD FINDINGS  Anterior circulation: Complete loss of flow related enhancement within the included LEFT cervical internal carotid artery to the level of the supraclinoid internal carotid artery. Normal flow related enhancement of the included RIGHT cervical, petrous, cavernous and supra clinoid internal carotid arteries. Patent anterior communicating artery. Normal flow related enhancement of the bilateral anterior cerebral arteries. Decreased flow related enhancement of LEFT middle cerebral artery, though vessel appears patent. Decreased number of distal LEFT middle cerebral artery branches. Mild luminal irregularity of the LEFT MCA.  No high-grade stenosis, aneurysm.  Posterior circulation: RIGHT vertebral artery is dominant. Basilar artery is patent, with normal flow related enhancement of the main branch vessels. Fetal origin LEFT posterior cerebral artery, small RIGHT posterior communicating artery present. Normal flow related enhancement of the posterior cerebral arteries.  No large vessel occlusion, high-grade stenosis, abnormal luminal irregularity, aneurysm.  IMPRESSION: MRI HEAD: Acute large LEFT middle cerebral artery territory infarct without hemorrhagic conversion.  Tubular susceptibility artifact LEFT cerebrum most compatible with thromboembolism distal LEFT MCA segment.  Mild white matter changes compatible with chronic small vessel ischemic disease.  MRA HEAD: LEFT internal carotid artery occlusion, recommend angiographic imaging in neck for further assessment.  Slow flow within LEFT middle cerebral artery, retrograde filling via LEFT PCOM. Complete circle of Willis. Mild LEFT MCA luminal irregularity which suggests atherosclerosis.   Electronically Signed   By: Elon Alas M.D.   On: 09/25/2014 06:58    DISCHARGE EXAMINATION: Filed Vitals:   09/29/14  0232 09/29/14 0558 09/29/14 1003 09/29/14 1044  BP: 142/71 113/61  120/78  Pulse: 50 52 61 58  Temp: 97.5 F (36.4 C) 97.7 F (36.5 C)  98.1 F (36.7 C)  TempSrc: Oral Oral  Oral  Resp: 18 20  20   Height:      Weight:      SpO2: 100% 98%  100%   General appearance: alert, cooperative, appears stated age and no distress Resp: clear to auscultation bilaterally Cardio: regular rate and rhythm, S1, S2 normal, no murmur, click, rub or gallop GI: soft, non-tender; bowel sounds normal; no masses,  no organomegaly Extremities: extremities normal, atraumatic, no cyanosis or edema Neurologic: No facial asymmetry. Strength is equal bilateral upper and lower extremity. Right-sided neglect present. Significant expressive aphasia noted.  DISPOSITION: To inpatient rehabilitation  Discharge Instructions    Ambulatory referral to Neurology    Complete by:  As directed   Pt will follow up with Dr. Erlinda Hong at [facility] in about 2 months. Thanks.           ALLERGIES: No Known Allergies  Current Inpatient Medications:  Scheduled: .  stroke: mapping our early stages of recovery book   Does not apply Once  . apixaban  5 mg Oral BID  . atorvastatin  40 mg Oral q1800  .  ceFAZolin (ANCEF) IV  1 g Intravenous 3 times per day  . metoprolol tartrate  12.5 mg Oral Daily   Continuous:  [id number], ondansetron (  ZOFRAN) IV  Follow-up Information    Follow up with Xu,Jindong, MD. Schedule an appointment as soon as possible for a visit in 2 months.   Specialty:  Neurology   Why:  stroke clinic   Contact information:   [address] [place] [address] [phone]       Follow up with Gennette Pac, MD. Schedule an appointment as soon as possible for a visit in 1 month.   Specialty:  Family Medicine   Why:  post hospitalization follow up   Contact information:   [place] [address] [phone]       Follow up with Melony Overly, MD. Schedule  an appointment as soon as possible for a visit in 3 weeks.   Specialty:  Otolaryngology   Why:  post hospitalization follow up   Contact information:   [place] [address] [phone]       Follow up with Sueanne Margarita, MD. Schedule an appointment as soon as possible for a visit in 2 months.   Specialty:  Cardiology   Contact information:   [address] [place] [address] [phone]       TOTAL DISCHARGE TIME: 35 minutes  [facility] Pager [phone]  09/29/2014, 12:24 PM

## 2014-09-29 NOTE — Progress Notes (Signed)
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 Pt con't to have expressive deficits and word finding difficulty. Pt with decreased safety awareness and decreased insight to deficits. Pt is improving in attn span and now demo's ability to follow simple commands 50% of time. Pt also demos impaired spatial awareness and self proprioception. Con't to recommend CIR upon d/c to address deficits and progress independence with function.   Follow Up Recommendations  CIR     Equipment Recommendations       Recommendations for Other Services Rehab consult     Precautions / Restrictions Precautions Precautions: Fall Precaution Comments: pt with both expressive and receptive difficulties, expressive worse than receptive Restrictions Weight Bearing Restrictions: No    Mobility  Bed Mobility Overal bed mobility: Needs Assistance Bed Mobility: Supine to Sit     Supine to sit: Supervision     General bed mobility comments: pt quick and impulsive, supervision for safety  Transfers Overall transfer level: Needs assistance Equipment used: None Transfers: Sit to/from Stand Sit to Stand: Min guard         General transfer comment: v/c's for safe hand placement. pt with poor self-awareness/prroprioception. pt "stretching" due to being in the bed hwoever demo'd exagerant back extension and marching  Ambulation/Gait Ambulation/Gait assistance: Mod assist;Max assist;+2 safety/equipment Ambulation Distance  (Feet): 400 Feet Assistive device: 1 person hand held assist;2 person hand held assist Gait Pattern/deviations: Step-through pattern;Ataxic;Staggering right;Narrow base of support     General Gait Details: pt with strong push with R UE. Pt also with R sided neglect running into walls and unable to self correct. max directional v/c's to turn to the right. Pt constantly moving UEs to try to balance UEs. pt with near tandem walking but no cross over. pt very unsteady.   Stairs            Wheelchair Mobility    Modified Rankin (Stroke Patients Only) Modified Rankin (Stroke Patients Only) Pre-Morbid Rankin Score: No symptoms Modified Rankin: Moderately severe disability     Balance Overall balance assessment: Needs assistance Sitting-balance support: No upper extremity supported;Feet supported Sitting balance-Leahy Scale: Fair     Standing balance support: No upper extremity supported;During functional activity Standing balance-Leahy Scale: Poor Standing balance comment: pt washed hands at sink, leaned on sink. required v/c's to find paper towels and soap due to being on R side. pt constantly moving UEs to maintain balance                    Cognition Arousal/Alertness: Awake/alert Behavior During Therapy: Impulsive Overall Cognitive Status: Impaired/Different from baseline Area of Impairment: Following commands;Attention;Memory;Safety/judgement;Problem solving   Current Attention Level: Sustained Memory: Decreased short-term memory;Decreased recall of precautions Following Commands: Follows one step commands inconsistently;Follows one step commands with increased time Safety/Judgement: Decreased awareness of safety;Decreased awareness of deficits (R sided neglect/inattention)   Problem Solving: Slow processing;Requires verbal cues;Requires tactile cues General Comments: pt with improved word finding this date however con't to perseverate on "im trying" remains unable  to say name  or sons or wife's name. however was able to tell me "no" when PT asked if wifes name was Raford Pitcher. and "yes" when I asked if it was sue"  pt remains to require tactile cues/hand over hand assist to complete tasks due to impaired comprehension. However during habitual tasks like going to the bathroom he was able to sequence properply.    Exercises      General Comments        Pertinent Vitals/Pain Pain Assessment: No/denies pain (however RN reports he kept pointing to head ) Pain Intervention(s): Premedicated before session    Home Living                      Prior Function            PT Goals (current goals can now be found in the care plan section) Acute Rehab PT Goals Patient Stated Goal: not stated Progress towards PT goals: Progressing toward goals    Frequency  Min 4X/week    PT Plan Current plan remains appropriate    Co-evaluation             End of Session Equipment Utilized During Treatment: Gait belt Activity Tolerance: Patient tolerated treatment well Patient left: in chair;with call bell/phone within reach;with chair alarm set     Time: 5625-6389 PT Time Calculation (min) (ACUTE ONLY): 27 min  Charges:  $Gait Training: 8-22 mins $Therapeutic Activity: 8-22 mins                    G Codes:      Kingsley Callander 09/29/2014, 11:44 AM   Kittie Plater, PT, DPT Pager #: [phone] Office #: [phone]

## 2014-09-29 NOTE — Progress Notes (Signed)
Speech Language Pathology Treatment: Cognitive-Linquistic  Patient Details Name: [name] MRN: [id number] DOB: 1955/04/02 Today's Date: 09/29/2014 Time: 4098-1191 SLP Time Calculation (min) (ACUTE ONLY): 32 min  Assessment / Plan / Recommendation Clinical Impression  Patient presents with Wernicke's Aphasia.  Patient able to verbalize greetings accurately; however the rest of his verbal expression was fluent phrase level expression that was filled with neologisms, which patient was occasionally aware of.  Patient's family reports that he was singing with them earlier; therefore, SLP facilitated session with Max multimodal cues for counting task.  Despite verbal, non-verbal and written cues for auditory feedback patient named letters with no awareness of difficulty.  Patient also required Max multimodal cues to inhibit verbal output as well as Max assist hand-over-hand assist to identify named object from a field of two.  Written words were used to augment task which was effective in 1/5 trials.  Patient and spouse educated on type of Aphasia and it's impacts on verbal expression as a result of impaired receptive language.  Continue to recommend CIR follow up.          HPI Other Pertinent Information: Pt is a 59 y.o. male with PMH paroxysmal A.fib who underwent ear procedure on 7/28, following which pt was found to be confused and with difficulty communicating verbally which was initially thought to be related to anesthesia and sedating meds but then showed no improvement. MRI 7/29 showed large acute L MCA infarct. CXR showed mild R basilar atelectasis which appeared chronic. Failed RN stroke swallow screen- not following commands, did not cough on command. Bedside swallow eval ordered to further assess swallow function and SLE ordered as part of stroke workup.   Pertinent Vitals Pain Assessment: No/denies pain  SLP Plan  Continue with current plan of care    Recommendations                 Oral Care Recommendations: Oral care BID Follow up Recommendations: Inpatient Rehab;24 hour supervision/assistance Plan: Continue with current plan of care    GO    Carmelia Roller., CCC-SLP [phone]  [place] 09/29/2014, 2:50 PM

## 2014-09-29 NOTE — H&P (View-Only) (Signed)
Physical Medicine and Rehabilitation Admission H&P    Chief Complaint  Patient presents with  . Altered Mental Status  . Extremity Weakness  : HPI: [name] is a 59 y.o. right handed male with history of basal cell carcinoma, atrial fibrillation maintained on aspirin 81 mg daily. Independent prior to admission living with his wife and active. Admitted 09/25/2014. Patient had undergone right ear tympanoplasty. Noted post procedure to have altered mental status and difficulty communicating. 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 MRI of the brain shows left middle cerebral artery territory infarct. 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 Patient did not receive TPA. Echocardiogram with ejection fraction of 65% no wall motion abnormalities. Carotid Dopplers with left ICA occlusion and right 40-59% ICA stenosis. CT angiogram of the neck again with left carotid occlusion and 65% right ICA stenosis. Neurology services consulted presently on Eliquis  for CVA prophylaxis. Tolerating a regular diet. Vascular surgery consulted Dr. Bridgett Larsson follow-up outpatient consideration for right carotid surgery. Physical and occupational therapy evaluations completed with recommendations of physical medicine rehabilitation consult. Patient was admitted for comprehensive rehabilitation program  ROS Review of Systems  Constitutional: Negative for fever and chills.  HENT: Positive for hearing loss.  Respiratory: Negative for cough and shortness of breath.  Cardiovascular: Positive for palpitations. Negative for chest pain.  Gastrointestinal: Positive for constipation. Negative for heartburn, vomiting and abdominal pain.  Genitourinary: Positive for urgency. Negative for hematuria.  Musculoskeletal: Positive for myalgias. Negative for back pain and neck pain.  Skin: Negative for rash.  Neurological: Negative for  headaches    Past Medical History  Diagnosis Date  . Atrial fibrillation   . Basal cell carcinoma   . Nephrolithiasis    Past Surgical History  Procedure Laterality Date  . Lithotripsy    . Vasectomy     Family History  Problem Relation Age of Onset  . Cancer Mother   . Heart failure Father    Social History:  reports that he has never smoked. He does not have any smokeless tobacco history on file. He reports that he drinks alcohol. He reports that he does not use illicit drugs. Allergies: No Known Allergies Medications Prior to Admission  Medication Sig Dispense Refill  . aspirin 81 MG tablet Take 81 mg by mouth daily.    . metoprolol tartrate (LOPRESSOR) 25 MG tablet Take 0.5 tablets (12.5 mg total) by mouth 2 (two) times daily. 90 tablet 1    Home: Home Living Family/patient expects to be discharged to:: Private residence Living Arrangements: Spouse/significant other Type of Home: House Home Access: Stairs to enter Technical brewer of Steps: 2 Entrance Stairs-Rails: Right Home Equipment: None   Functional History: Prior Function Level of Independence: Independent Comments: works for [employer]; plays tennis/active  Functional Status:  Mobility: Bed Mobility Overal bed mobility: Needs Assistance Bed Mobility: Supine to Sit Supine to sit: Supervision General bed mobility comments: pt able to transfer self but required supervision due to impulsivity and safety Transfers Overall transfer level: Needs assistance Equipment used: None Transfers: Sit to/from Stand Sit to Stand: Min assist Stand pivot transfers: Min assist General transfer comment: v/c's for safe hand placement, minA to steady patient during overall sway laterally and anterior/posterior Ambulation/Gait Ambulation/Gait assistance: Mod assist, Max assist, +2 physical assistance Ambulation Distance (Feet): 150 Feet Assistive device: 2 person hand held assist Gait Pattern/deviations:  Step-through pattern, Staggering left, Staggering  right, Ataxic General Gait Details: pt with strong push to the Right, pt with R sided neglect/inattention. pt unable to maintain midline position and had constant R lateral lean but also was swaying laterally and had trunk instability. pt would exagerate stepping also. Gait velocity interpretation:  (impulsively fast)    ADL: ADL Overall ADL's : Needs assistance/impaired Eating/Feeding: Minimal assistance, Sitting Grooming: Oral care, Minimal assistance, Sitting, Wash/dry hands, Wash/dry face Upper Body Bathing: Sitting, Minimal assitance Lower Body Bathing: Moderate assistance, Sit to/from stand Upper Body Dressing : Minimal assistance, Sitting Lower Body Dressing: Sit to/from stand, Minimal assistance, Moderate assistance Toilet Transfer: Minimal assistance, Stand-pivot (from bed to chair) Functional mobility during ADLs: Minimal assistance General ADL Comments: Pt requiring several verbal and visual cues in session. Pt at first not understanding that OT wanted him to doff socks, but he was able to do it later in session.  Cognition: Cognition Overall Cognitive Status: Impaired/Different from baseline Arousal/Alertness: Awake/alert Orientation Level: Other (comment) (global aphasia) Attention: Selective Selective Attention: Appears intact Awareness:  (aware of language deficits- frustrated) Cognition Arousal/Alertness: Awake/alert Behavior During Therapy: Impulsive Overall Cognitive Status: Impaired/Different from baseline Area of Impairment: Following commands, Attention, Memory, Safety/judgement, Problem solving (unable to report orientation due to aphasia) Current Attention Level: Focused Memory: Decreased short-term memory Following Commands: Follows one step commands inconsistently (most likely due to inability to comprehend) Safety/Judgement: Decreased awareness of safety, Decreased awareness of deficits Problem Solving:  Slow processing, Difficulty sequencing, Requires verbal cues, Requires tactile cues General Comments: pt with global aphasia however did have appropriate word finding and response 25% of time. ie. pt said "thank you" "you are helping me" at end of session but was unable to report children or wife's name. pt showed signs of frustration and emotional Difficult to assess due to: Impaired communication  Physical Exam: Blood pressure 126/82, pulse 67, temperature 98.1 F (36.7 C), temperature source Oral, resp. rate 20, height 5' 10"  (1.778 m), weight 98.8 kg (217 lb 13 oz), SpO2 100 %. Physical Exam Constitutional: He appears well-developed.  HENT: dentition fair to good, oral mucosa pink and moist Head: Normocephalic.  Eyes: EOM are normal.  Neck: Normal range of motion. Neck supple. No thyromegaly present.  Cardiovascular:  Cardiac rate control without murmur Respiratory: Effort normal and breath sounds normal. No respiratory distress. No wheezes or rales GI: Soft. Bowel sounds are normal. He exhibits no distension.  Neurological: He is alert. No focal CN abnl.  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 He would follow some simple one-step demonstrate commands but inconsistently. MMT: Right--- deltoid, biceps, triceps, wrist, HI, HF, KE, ADF, APF 4 to 4+/5 with decreased Samson. LUE and LLE 5/5 prox to distal. Sensory intact to paint and gross touch in all 4. dtr's 2+. Perseverates on certain tasks and phrases----needs substantial cueing to shift from one task to another--somewhat improved from yesterday.  Skin: Skin is warm and dry. Sutures removed from for head lesion excision site clean and dry  Psychiatric: He has a normal mood and affect. His behavior is normal    Results for orders placed or performed during the hospital encounter of 09/24/14 (from the past 48 hour(s))  Glucose, capillary     Status: None   Collection Time: 09/26/14 11:31 AM  Result Value Ref  Range   Glucose-Capillary 99 65 - 99 mg/dL   Comment 1 Document in Chart   Glucose, capillary     Status: None   Collection Time: 09/26/14  4:49 PM  Result Value Ref Range   Glucose-Capillary 89 65 - 99 mg/dL  Glucose, capillary     Status: Abnormal   Collection Time: 09/26/14  9:36 PM  Result Value Ref Range   Glucose-Capillary 138 (H) 65 - 99 mg/dL   Comment 1 Notify RN    Comment 2 Document in Chart   CBC     Status: None   Collection Time: 09/27/14  5:55 AM  Result Value Ref Range   WBC 10.3 4.0 - 10.5 K/uL   RBC 4.70 4.22 - 5.81 MIL/uL   Hemoglobin 14.4 13.0 - 17.0 g/dL   HCT 42.6 39.0 - 52.0 %   MCV 90.6 78.0 - 100.0 fL   MCH 30.6 26.0 - 34.0 pg   MCHC 33.8 30.0 - 36.0 g/dL   RDW 12.4 11.5 - 15.5 %   Platelets 200 150 - 400 K/uL  Basic metabolic panel     Status: Abnormal   Collection Time: 09/27/14  5:55 AM  Result Value Ref Range   Sodium 138 135 - 145 mmol/L   Potassium 4.2 3.5 - 5.1 mmol/L   Chloride 105 101 - 111 mmol/L   CO2 26 22 - 32 mmol/L   Glucose, Bld 100 (H) 65 - 99 mg/dL   BUN 12 6 - 20 mg/dL   Creatinine, Ser 1.01 0.61 - 1.24 mg/dL   Calcium 8.7 (L) 8.9 - 10.3 mg/dL   GFR calc non Af Amer >60 >60 mL/min   GFR calc Af Amer >60 >60 mL/min    Comment: (NOTE) The eGFR has been calculated using the CKD EPI equation. This calculation has not been validated in all clinical situations. eGFR's persistently <60 mL/min signify possible Chronic Kidney Disease.    Anion gap 7 5 - 15  Glucose, capillary     Status: None   Collection Time: 09/27/14  6:43 AM  Result Value Ref Range   Glucose-Capillary 99 65 - 99 mg/dL   Comment 1 Notify RN    Comment 2 Document in Chart   Glucose, capillary     Status: None   Collection Time: 09/27/14  9:01 PM  Result Value Ref Range   Glucose-Capillary 78 65 - 99 mg/dL   Comment 1 Notify RN    Comment 2 Document in Chart    No results found.     Medical Problem List and Plan: 1. Functional deficits secondary to  embolic left MCA infarct (?etiology) with expressive receptive aphasia after right ear tympanoplasty 09/24/2014 2.  DVT Prophylaxis/Anticoagulation: Eliquis required due to afib. Monitor for a bleeding episodes.  3. Pain Management: Tylenol as needed. No pain at present 4. Hypertension/atrial fibrillation. Lopressor 12.5 mg twice a day. Cardiac rate controlled and regular currently 5. Neuropsych: This patient is not capable of making decisions on his own behalf. 6. Skin/Wound Care: Routine skin checks on admit and throughout stay 7. Fluids/Electrolytes/Nutrition: Routine I&O with follow-up chemistries. Encourage po 8. Hyperlipidemia. Lipitor for secondary stroke prevention 9. Right ICA stenosis. Follow-up outpatient with vascular surgery. Left ICA occluded    Post Admission Physician Evaluation: 1. Functional deficits secondary  to likely embolic left MCA infarct. 2. Patient is admitted to receive collaborative, interdisciplinary care between the physiatrist, rehab nursing staff, and therapy team. 3. Patient's level of medical complexity and substantial therapy needs in context of that medical necessity cannot be provided at a lesser intensity of care such as a SNF. 4. Patient has experienced substantial functional loss from his/her baseline which was documented above  under the "Functional History" and "Functional Status" headings.  Judging by the patient's diagnosis, physical exam, and functional history, the patient has potential for functional progress which will result in measurable gains while on inpatient rehab.  These gains will be of substantial and practical use upon discharge  in facilitating mobility and self-care at the household level. 5. Physiatrist will provide 24 hour management of medical needs as well as oversight of the therapy plan/treatment and provide guidance as appropriate regarding the interaction of the two. 6. 24 hour rehab nursing will assist with bladder management,  bowel management, safety, skin/wound care, disease management, medication administration, pain management and patient education  and help integrate therapy concepts, techniques,education, etc. 7. PT will assess and treat for/with: Lower extremity strength, range of motion, stamina, balance, functional mobility, safety, adaptive techniques and equipment, NMR, cognitive-linguistic impact on gait/safety/mobility, family ed.   Goals are: supervision to mod I. 8. OT will assess and treat for/with: ADL's, functional mobility, safety, upper extremity strength, adaptive techniques and equipment, NMR, cognitive-linguistic impact on gait, safety.   Goals are: supervision to mod I. Therapy may proceed with showering this patient. 9. SLP will assess and treat for/with: language, communication, cognition.  Goals are: supervision to min assist. 10. Case Management and Social Worker will assess and treat for psychological issues and discharge planning. 11. Team conference will be held weekly to assess progress toward goals and to determine barriers to discharge. 12. Patient will receive at least 3 hours of therapy per day at least 5 days per week. 13. ELOS: 7-10 days       14. Prognosis:  excellent     Meredith Staggers, MD, [facility] 09/29/2014

## 2014-09-29 NOTE — Progress Notes (Signed)
I await [employer] approval to admit pt to inpt rehab today. [phone]

## 2014-09-29 NOTE — Interval H&P Note (Signed)
[name] was admitted today to Inpatient Rehabilitation with the diagnosis of left MCA infarct.  The patient's history has been reviewed, patient examined, and there is no change in status.  Patient continues to be appropriate for intensive inpatient rehabilitation.  I have reviewed the patient's chart and labs.  Questions were answered to the patient's satisfaction. The PAPE has been reviewed and assessment remains appropriate.  SWARTZ,ZACHARY T 09/29/2014, 4:26 PM

## 2014-09-29 NOTE — Progress Notes (Signed)
PMR Admission Coordinator Pre-Admission Assessment  Patient: [name] is an 59 y.o., male MRN: [id number] DOB: 1955-04-20 Height: 5\' 10"  (177.8 cm) Weight: 98.8 kg (217 lb 13 oz)  Insurance Information HMO: PPO: yes PCP: IPA: 80/20: OTHER:  PRIMARY: [employer] [place] Policy#: [id number] Subscriber: pt CM Name: Phyllis Ginger Phone#: [phone] Fax#: [phone] Pre-Cert#: [id number] Employer: approved 8/2- 8/12 update due 8/8 Benefits: Phone #: [phone] Name: 09/28/14 Eff. Date: 01/27/14 Deduct: $2000/met Out of Pocket Max: $4000/met Life Max: none CIR: 80% SNF: 80% Outpatient: 80% Co-Pay: 30 visits combined Home Health: 80% Co-Pay: no visit limit DME: 80% Co-Pay: 20% Providers: in network  SECONDARY: none Policy#: Subscriber:   Medicaid Application Date: Case Manager:  Disability Application Date: Case Worker:  Wife to bring in short term disability paperwork to be completed  Emergency Contact Information Contact Information    Name Relation Home Work Mobile   Brander,[name] Spouse [phone]     [place], Yepiz   [phone]     Current Medical History  Patient Admitting Diagnosis: left MCA infarct  History of Present Illness: [name] is a 59 y.o. right handed male with history of basal cell carcinoma, atrial fibrillation maintained on aspirin 81 mg daily. Independent prior to admission living with his wife and active. Admitted 09/25/2014. Patient had undergone right ear tympanoplasty. Noted post procedure to have altered mental status and difficulty communicating. 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 MRI of the brain  shows left middle cerebral artery territory infarct. 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 Patient did not receive TPA. Echocardiogram with ejection fraction of 65% no wall motion abnormalities. Carotid Dopplers with left ICA occlusion and right 40-59% ICA stenosis. CT angiogram of the neck again with left carotid occlusion and 65% right ICA stenosis. Neurology services consulted presently on Eliquis for CVA prophylaxis. Tolerating a regular diet. Vascular surgery consulted Dr. Bridgett Larsson follow-up outpatient consideration for right carotid surgery.    Total: 8 [facility]    Past Medical History  Past Medical History  Diagnosis Date  . Atrial fibrillation   . Basal cell carcinoma   . Nephrolithiasis     Family History  family history includes Cancer in his mother; Heart failure in his father.  Prior Rehab/Hospitalizations:  Has the patient had major surgery during 100 days prior to admission? Yes  Current Medications   Current facility-administered medications:  . stroke: mapping our early stages of recovery book, , Does not apply, Once, Rise Patience, MD . acetaminophen (TYLENOL) tablet 650 mg, 650 mg, Oral, Q6H PRN, Bonnielee Haff, MD, 650 mg at 09/29/14 0828 . apixaban (ELIQUIS) tablet 5 mg, 5 mg, Oral, BID, Bonnielee Haff, MD, 5 mg at 09/29/14 1003 . atorvastatin (LIPITOR) tablet 40 mg, 40 mg, Oral, q1800, Bonnielee Haff, MD, 40 mg at 09/28/14 1650 . ceFAZolin (ANCEF) IVPB 1 g/50 mL premix, 1 g, Intravenous, 3 times per day, Bonnielee Haff, MD, 1 g at 09/28/14 2123 . metoprolol tartrate (LOPRESSOR) tablet 12.5 mg, 12.5 mg, Oral, Daily, Bonnielee Haff, MD, 12.5 mg at 09/29/14 1003 . 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 None of the above  Prior Functional Level Prior Function Level of Independence: Independent Comments: plays tennis and basketball, very active  Self Care: Did the patient need help bathing, dressing, using the toilet or eating? Independent  Indoor Mobility: Did the patient need assistance with walking from room to room (with or without device)? Independent  Stairs: Did the patient need assistance with internal or external stairs (with or without device)? Independent  Functional Cognition: Did the patient need help planning regular tasks such as shopping or remembering to take medications? Independent  Current Functional Level Cognition  Arousal/Alertness: Awake/alert Overall Cognitive Status: Impaired/Different from baseline Difficult to assess due to: Impaired communication Current Attention Level: Sustained Orientation Level: Oriented to person, Oriented to place, Disoriented to situation, Disoriented to time Following Commands: Follows one step commands inconsistently, Follows one step commands with increased time Safety/Judgement: Decreased awareness of safety, Decreased awareness of deficits (R sided neglect/inattention) General Comments: pt with improved word finding this date however con't to perseverate on "im trying" remains unable to say name or sons or wife's name. however was able to tell me "no" when PT asked if wifes name was Raford Pitcher. and "yes" when I  asked if it was sue" pt remains to require tactile cues/hand over hand assist to complete tasks due to impaired comprehension. However during habitual tasks like going to the bathroom he was able to sequence properply. Attention: Selective Selective Attention: Appears intact Awareness: (aware of language deficits- frustrated)   Extremity Assessment (includes Sensation/Coordination)  Upper Extremity Assessment: RUE deficits/detail RUE Deficits / Details: appeared to have coordination difficulties in RUE; strength/sensation difficult to assess due to impaired communication  Lower Extremity Assessment: Defer to PT evaluation RLE Deficits / Details: difficult to manual muscle test due to language impairments/decr following commands; grossly >4/5 throughout RLE Sensation: decreased light touch (?absent; no response to painful stimuli) RLE Coordination: decreased gross motor (likely due to decr sensation)    ADLs  Overall ADL's : Needs assistance/impaired Eating/Feeding: Minimal assistance, Sitting Grooming: Oral care, Minimal assistance, Sitting, Wash/dry hands, Wash/dry face Upper Body Bathing: Sitting, Minimal assitance Lower Body Bathing: Moderate assistance, Sit to/from stand Upper Body Dressing : Minimal assistance, Sitting Lower Body Dressing: Sit to/from stand, Minimal assistance, Moderate assistance Toilet Transfer: Minimal assistance, Stand-pivot (from bed to chair) Functional mobility during ADLs: Minimal assistance General ADL Comments: Pt requiring several verbal and visual cues in session. Pt at first not understanding that OT wanted him to doff socks, but he was able to do it later in session.    Mobility  Overal bed mobility: Needs Assistance Bed Mobility: Supine to Sit Supine to sit: Supervision General bed mobility comments: pt quick and impulsive, supervision for safety    Transfers  Overall transfer level: Needs assistance Equipment used:  None Transfers: Sit to/from Stand Sit to Stand: Min guard Stand pivot transfers: Min assist General transfer comment: v/c's for safe hand placement. pt with poor self-awareness/prroprioception. pt "stretching" due to being in the bed hwoever demo'd exagerant back extension and marching    Ambulation / Gait / Stairs / Wheelchair Mobility  Ambulation/Gait Ambulation/Gait assistance: Mod assist, Max assist, +2 safety/equipment Ambulation Distance (Feet):  400 Feet Assistive device: 1 person hand held assist, 2 person hand held assist Gait Pattern/deviations: Step-through pattern, Ataxic, Staggering right, Narrow base of support General Gait Details: pt with strong push with R UE. Pt also with R sided neglect running into walls and unable to self correct. max directional v/c's to turn to the right. Pt constantly moving UEs to try to balance UEs. pt with near tandem walking but no cross over. pt very unsteady. Gait velocity interpretation: (impulsively fast)    Posture / Balance Balance Overall balance assessment: Needs assistance Sitting-balance support: No upper extremity supported, Feet supported Sitting balance-Leahy Scale: Fair Standing balance support: No upper extremity supported, During functional activity Standing balance-Leahy Scale: Poor Standing balance comment: pt washed hands at sink, leaned on sink. required v/c's to find paper towels and soap due to being on R side. pt constantly moving UEs to maintain balance    Special needs/care consideration Bowel mgmt: continent Bladder mgmt: continent    Previous Home Environment Living Arrangements: Spouse/significant other Lives With: Spouse Available Help at Discharge: Family, Available 24 hours/day, Other (Comment) (wife would have to take FMLA, works at [employer]) Type of Home: [place]: Two level, 1/2 bath on main level, Bed/bath upstairs, Other (Comment) (has two level home and also full basement that has  full apar) Home Access: Stairs to enter Entrance Stairs-Rails: Right Entrance Stairs-Number of Steps: 2 Bathroom Shower/Tub: Tub/shower unit, Walk-in shower, Other (comment) (upstairs has both walk in shower and tub/shower combination) Bathroom Toilet: Standard Bathroom Accessibility: Yes How Accessible: Accessible via walker [place]: No  Discharge Living Setting Plans for Discharge Living Setting: Patient's home, Lives with (comment), Other (Comment) (wife) Type of Home at Discharge: House Discharge Home Layout: Two level, 1/2 bath on main level, Other (Comment) (has full basement with apartment and full bath. otherwise tw) Discharge Home Access: Stairs to enter Entrance Stairs-Rails: Right Entrance Stairs-Number of Steps: 2 Discharge Bathroom Shower/Tub: Tub/shower unit, Walk-in shower, Other (comment) (upstairs has both) Discharge Bathroom Toilet: Standard Discharge Bathroom Accessibility: Yes How Accessible: Accessible via walker Does the patient have any problems obtaining your medications?: No  Social/Family/Support Systems Patient Roles: Spouse, Parent, Other (Comment) (two adult sons, also works fulltime) Sport and exercise psychologist Information: Manuela Schwartz, wife Anticipated Caregiver: wife will have to take FMLA Anticipated Caregiver's Contact Information: see above Ability/Limitations of Caregiver: wife works at [employer], will have to take fmla Caregiver Availability: 24/7 Discharge Plan Discussed with Primary Caregiver: Yes Is Caregiver In Agreement with Plan?: Yes Does Caregiver/Family have Issues with Lodging/Transportation while Pt is in Rehab?: No   Goals/Additional Needs Patient/Family Goal for Rehab: supervision with PT,, OT and supervision to min assist with SLP Expected length of stay: [facility] 7-10 days Additional Information: pt likes to play tennis and basketball, very active Pt/Family Agrees to Admission and willing to participate: Yes Program Orientation Provided &  Reviewed with Pt/Caregiver Including Roles & Responsibilities: Yes   Decrease burden of Care through IP rehab admission: n/a  Possible need for SNF placement upon discharge:no  Patient Condition: This patient's medical and functional status has changed since the consult dated: 09/28/2014 in which the Rehabilitation Physician determined and documented that the patient's condition is appropriate for intensive rehabilitative care in an inpatient rehabilitation facility. Medical changes are: as above noted. Functional changes are: mod assist. After evaluating the patient today and speaking with the Rehabilitation physician and acute team, the patient remains appropriate for inpatient rehab. Will admit to inpatient rehab today.  Preadmission Screen Completed By:  Cleatrice Burke, 09/29/2014 1:48 PM ______________________________________________________________________  Discussed status with Dr. Naaman Plummer on 09/29/2014 at 1348 and received telephone approval for admission today.  Admission Coordinator: Cleatrice Burke, time 1423 Date 09/29/2014          Cosigned by: Meredith Staggers, MD at 09/29/2014 2:27 PM  Revision History     Date/Time User Provider Type Action   09/29/2014 2:27 PM Meredith Staggers, MD Physician Cosign   09/29/2014 1:48 PM Cristina Gong, RN Rehab Admission Coordinator Sign

## 2014-09-30 ENCOUNTER — Inpatient Hospital Stay (HOSPITAL_COMMUNITY): Payer: BLUE CROSS/BLUE SHIELD | Admitting: Physical Therapy

## 2014-09-30 ENCOUNTER — Inpatient Hospital Stay (HOSPITAL_COMMUNITY): Payer: BLUE CROSS/BLUE SHIELD | Admitting: Occupational Therapy

## 2014-09-30 ENCOUNTER — Inpatient Hospital Stay (HOSPITAL_COMMUNITY): Payer: BLUE CROSS/BLUE SHIELD

## 2014-09-30 ENCOUNTER — Inpatient Hospital Stay (HOSPITAL_COMMUNITY): Payer: BLUE CROSS/BLUE SHIELD | Admitting: Speech Pathology

## 2014-09-30 LAB — COMPREHENSIVE METABOLIC PANEL
ALT: 28 U/L (ref 17–63)
ANION GAP: 9 (ref 5–15)
AST: 26 U/L (ref 15–41)
Albumin: 3.6 g/dL (ref 3.5–5.0)
Alkaline Phosphatase: 62 U/L (ref 38–126)
BUN: 13 mg/dL (ref 6–20)
CALCIUM: 9.3 mg/dL (ref 8.9–10.3)
CO2: 26 mmol/L (ref 22–32)
CREATININE: 0.85 mg/dL (ref 0.61–1.24)
Chloride: 102 mmol/L (ref 101–111)
GFR calc Af Amer: >60 mL/min (ref >60)
GFR calc non Af Amer: >60 mL/min (ref >60)
Glucose, Bld: 108 mg/dL — ABNORMAL HIGH (ref 65–99)
Potassium: 4.1 mmol/L (ref 3.5–5.1)
Sodium: 137 mmol/L (ref 135–145)
TOTAL PROTEIN: 7.3 g/dL (ref 6.5–8.1)
Total Bilirubin: 0.8 mg/dL (ref 0.3–1.2)

## 2014-09-30 LAB — CBC WITH DIFFERENTIAL/PLATELET
BASOS ABS: 0 10*3/uL (ref 0.0–0.1)
BASOS PCT: 0 % (ref 0–1)
EOS ABS: 0.1 10*3/uL (ref 0.0–0.7)
EOS PCT: 1 % (ref 0–5)
HCT: 41.9 % (ref 39.0–52.0)
Hemoglobin: 14.5 g/dL (ref 13.0–17.0)
LYMPHS ABS: 1.7 10*3/uL (ref 0.7–4.0)
Lymphocytes Relative: 18 % (ref 12–46)
MCH: 30.9 pg (ref 26.0–34.0)
MCHC: 34.6 g/dL (ref 30.0–36.0)
MCV: 89.3 fL (ref 78.0–100.0)
MONO ABS: 0.8 10*3/uL (ref 0.1–1.0)
Monocytes Relative: 9 % (ref 3–12)
Neutro Abs: 6.5 10*3/uL (ref 1.7–7.7)
Neutrophils Relative %: 72 % (ref 43–77)
PLATELETS: 228 10*3/uL (ref 150–400)
RBC: 4.69 MIL/uL (ref 4.22–5.81)
RDW: 12.5 % (ref 11.5–15.5)
WBC: 9 10*3/uL (ref 4.0–10.5)

## 2014-09-30 MED ORDER — TRAMADOL HCL 50 MG PO TABS
100.0000 mg | ORAL_TABLET | Freq: Four times a day (QID) | ORAL | Status: DC | PRN
  Administered 2014-09-30 – 2014-10-07 (×7): 100 mg via ORAL
  Filled 2014-09-30 (×7): qty 2

## 2014-09-30 MED ORDER — TRAMADOL HCL 50 MG PO TABS
50.0000 mg | ORAL_TABLET | Freq: Four times a day (QID) | ORAL | Status: DC | PRN
  Administered 2014-09-30 (×2): 50 mg via ORAL
  Filled 2014-09-30 (×2): qty 1

## 2014-09-30 NOTE — Care Management Note (Signed)
[facility] Individual Statement of Services  Patient Name:  [name]  Date:  09/30/2014  Welcome to the [facility].  Our goal is to provide you with an individualized program based on your diagnosis and situation, designed to meet your specific needs.  With this comprehensive rehabilitation program, you will be expected to participate in at least 3 hours of rehabilitation therapies Monday-Friday, with modified therapy programming on the weekends.  Your rehabilitation program will include the following services:  Physical Therapy (PT), Occupational Therapy (OT), Speech Therapy (ST), 24 hour per day rehabilitation nursing, Therapeutic Recreaction (TR), Neuropsychology, Case Management (Social Worker), Rehabilitation Medicine, Nutrition Services and Pharmacy Services  Weekly team conferences will be held on Wednesday to discuss your progress.  Your Social Worker will talk with you frequently to get your input and to update you on team discussions.  Team conferences with you and your family in attendance may also be held.  Expected length of stay: 7-10 days  Overall anticipated outcome: supervision with cueing  Depending on your progress and recovery, your program may change. Your Social Worker will coordinate services and will keep you informed of any changes. Your Social Worker's name and contact numbers are listed  below.  The following services may also be recommended but are not provided by the [facility] will be made to provide these services after discharge if needed.  Arrangements include referral to agencies that provide these services.  Your insurance has been verified to be:  [place] Your primary doctor is:  Lennette Bihari [facility]  Pertinent information will be shared with your doctor  and your insurance company.  Social Worker:  Ovidio Kin, [place] or [phone]  Information discussed with and copy given to patient by: Elease Hashimoto, 09/30/2014, 12:02 PM

## 2014-09-30 NOTE — Progress Notes (Signed)
Occupational Therapy Note  Patient Details  Name: [name] MRN: [id number] Date of Birth: Dec 07, 1955  Today's Date: 09/30/2014 OT Individual Time: 1330-1400 OT Individual Time Calculation (min): 30 min   Pt denied pain Individual Therapy  Pt resting in w/c with wife and son present.  Pt engaged in functional amb without AD to ADL apartment.  Pt practiced stepping over into tub and performed sit<>stand from shower chair.  Pt amb to dayroom and back to ADL aparment. Pt exhibited drift to right and required max multimodal cues to avoid running into objects on right.  Pt exhibited difficulties following directions secondary to language deficits.  Pt also engaged in simple home mgmt tasks to challenge dynamic standing balance.  RN observed part of therapy to determine if nursing staff would be able to assist patient with ambulation after therapy hours.     Leotis Shames [facility] 09/30/2014, 3:12 PM

## 2014-09-30 NOTE — Progress Notes (Signed)
Occupational Therapy Assessment and Plan  Patient Details  Name: [name] MRN: [id number] Date of Birth: 1955/04/19  OT Diagnosis: apraxia, ataxia and cognitive deficits, coordination deficit Rehab Potential: Rehab Potential (ACUTE ONLY): Good ELOS: 7- 10 days   Today's Date: 09/30/2014 OT Individual Time: 0700-0800 OT Individual Time Calculation (min): 60 min     Problem List:  Patient Active Problem List   Diagnosis Date Noted  . 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 Left middle cerebral artery stroke 09/29/2014  . Acute ischemic left MCA stroke   . ICAO (internal carotid artery occlusion)   . Carotid stenosis   . CVA (cerebral infarction) 09/25/2014  . Stroke 09/25/2014  . PAF (paroxysmal atrial fibrillation) 09/25/2014  . Acute CVA (cerebrovascular accident)   . SOB (shortness of breath) 05/19/2014  . Heart palpitations 05/19/2014  . Atrial fibrillation     Past Medical History:  Past Medical History  Diagnosis Date  . Atrial fibrillation   . Basal cell carcinoma   . Nephrolithiasis    Past Surgical History:  Past Surgical History  Procedure Laterality Date  . Lithotripsy    . Vasectomy      Assessment & Plan Clinical Impression: Patient is a 59 y.o. year old male  with history of basal cell carcinoma, atrial fibrillation maintained on aspirin 81 mg daily. Independent prior to admission living with his wife and active. Admitted 09/25/2014. Patient had undergone right ear tympanoplasty. Noted post procedure to have altered mental status and difficulty communicating. 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 MRI of the brain shows left middle cerebral artery territory infarct. 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 Patient did not receive TPA. Echocardiogram with ejection fraction of 65% no wall motion abnormalities. Carotid  Dopplers with left ICA occlusion and right 40-59% ICA stenosis. CT angiogram of the neck again with left carotid occlusion and 65% right ICA stenosis. Neurology services consulted presently on Eliquis for CVA prophylaxis. Tolerating a regular diet. Vascular surgery consulted Dr. Bridgett Larsson follow-up outpatient consideration for right carotid surgery. Physical and occupational therapy evaluations completed with recommendations of physical medicine rehabilitation consult. Patient was admitted for comprehensive rehabilitation program .  Patient transferred to CIR on 09/29/2014 .    Patient currently requires mod with basic self-care skills secondary to muscle weakness, decreased cardiorespiratoy endurance, decreased initiation, decreased attention, decreased awareness, decreased problem solving, decreased safety awareness, decreased memory and delayed processing and decreased sitting balance, decreased standing balance and decreased balance strategies.  Prior to hospitalization, patient could complete ADLs and IADLs with independent .  Patient will benefit from skilled intervention to increase independence with basic self-care skills prior to discharge home with care partner.  Anticipate patient will require 24 hour supervision and follow up outpatient.  OT - End of Session Activity Tolerance: Decreased this session Endurance Deficit: Yes Endurance Deficit Description: rest breaks secondary to fatigue OT Assessment Rehab Potential (ACUTE ONLY): Good Barriers to Discharge:  (none noted) OT Patient demonstrates impairments in the following area(s): Balance;Cognition;Endurance;Motor;Safety;Vision OT Basic ADL's Functional Problem(s): Grooming;Bathing;Dressing;Toileting OT Advanced ADL's Functional Problem(s):  (n/a) OT Transfers Functional Problem(s): Toilet;Tub/Shower OT Additional Impairment(s): None OT Plan OT Intensity: Minimum of 1-2 x/day, 45 to 90 minutes OT Frequency: 5 out of 7 days OT  Duration/Estimated Length of Stay: 7- 10 days OT Treatment/Interventions: Medical illustrator training;Community reintegration;Functional electrical stimulation;Patient/family education;Self Care/advanced ADL retraining;Therapeutic  Exercise;UE/LE Coordination activities;UE/LE Strength taining/ROM;Therapeutic Activities;Psychosocial support;Functional mobility training;DME/adaptive equipment instruction;Discharge planning OT Self Feeding Anticipated Outcome(s): n/a OT Basic Self-Care Anticipated Outcome(s): overall supervision OT Toileting Anticipated Outcome(s): overall supervision OT Bathroom Transfers Anticipated Outcome(s): overall supervision OT Recommendation Recommendations for Other Services:  (none at this time) Patient destination: Home Follow Up Recommendations: Outpatient OT;24 hour supervision/assistance Equipment Recommended: Tub/shower seat   Skilled Therapeutic Intervention Upon entering the room, pt supine in bed with no c/o pain this session. Pt educated on OT purpose, POC, and goals and verbalized understanding. Pt requiring max verbal cues for perseveration on tasks, initiation, safety awareness, and attending to task. Pt performed supine >sit with supervision and ambulation to shower with min A and no use of AD. Pt bathed at shower level with sit <> stand and min A. Pt returning to sit into recliner chair for dressing tasks with STS and min A for balance. Pt remained seated in recliner chair with quick release belt donned for safety. Call bell and all needed items within reach upon exiting the room.   OT Evaluation Precautions/Restrictions  Precautions Precautions: Fall Precaution Comments: pt with both expressive and receptive difficulties, expressive worse than receptive Restrictions Weight Bearing Restrictions: No Vital Signs Therapy Vitals Pulse Rate: 65 Resp: 20 BP: 116/67 mmHg Patient Position (if appropriate): Sitting Oxygen Therapy SpO2: 100 % O2 Device: Not  Delivered Pain Pain Assessment Pain Assessment: 0-10 Pain Score: 4  Pain Type: Acute pain Pain Location: Head Pain Orientation: Left;Anterior Pain Descriptors / Indicators: Headache Pain Onset: Awakened from sleep Patients Stated Pain Goal: 2 Pain Intervention(s): RN made aware Multiple Pain Sites: No Home Living/Prior Functioning Home Living Family/patient expects to be discharged to:: Private residence Living Arrangements: Spouse/significant other Available Help at Discharge: Family Type of Home: House Home Access: Stairs to enter Technical brewer of Steps: 5 Entrance Stairs-Rails: Can reach both, Left, Right Home Layout: Multi-level, Bed/bath upstairs Alternate Level Stairs-Number of Steps: full flight +3 steps Alternate Level Stairs-Rails: Left Bathroom Shower/Tub: Tub/shower unit, Walk-in shower, Other (comment), Curtain, Door [employer] Toilet: Standard Bathroom Accessibility: Yes  Lives With: Spouse Prior Function Level of Independence: Independent with basic ADLs, Independent with gait  Able to Take Stairs?: Reciprically Driving: Yes Vocation: Full time employment Leisure: Hobbies-yes (Comment) Comments: active in 3 tennis leagues  Vision/Perception  Vision- History Baseline Vision/History: Wears glasses Wears Glasses: Reading only Vision- Assessment Vision Assessment?: Vision impaired- to be further tested in functional context  Cognition Overall Cognitive Status: Impaired/Different from baseline Arousal/Alertness: Awake/alert Year:  (unable to formally assess secondary to aphasia) Attention: Selective Selective Attention: Appears intact Awareness: Impaired Awareness Impairment: Intellectual impairment Problem Solving: Impaired Problem Solving Impairment: Verbal basic;Functional basic Safety/Judgment: Impaired Sensation Sensation Light Touch: Appears Intact Stereognosis: Not tested Hot/Cold: Not tested Coordination Gross Motor Movements are  Fluid and Coordinated: No Fine Motor Movements are Fluid and Coordinated: No Finger Nose Finger Test: impaired R>L Heel Shin Test: impaired accuracy, fluid movement Motor  Motor Motor: Motor apraxia Motor - Skilled Clinical Observations: apraxia Mobility  Transfers Transfers: Sit to Stand;Stand to Sit Sit to Stand: 4: Min assist Sit to Stand Details: Verbal cues for sequencing;Verbal cues for precautions/safety;Verbal cues for technique Stand to Sit: 4: Min assist Stand to Sit Details (indicate cue type and reason): Verbal cues for sequencing;Verbal cues for precautions/safety;Verbal cues for technique  Trunk/Postural Assessment  Cervical Assessment Cervical Assessment: Exceptions to [facility] (forward head) Thoracic Assessment Thoracic Assessment: Within Functional Limits Lumbar Assessment Lumbar Assessment: Within Functional Limits  Balance Standardized  Balance Assessment Standardized Balance Assessment: Timed Up and Go Test Timed Up and Go Test TUG: Manual TUG Extremity/Trunk Assessment RUE Assessment RUE Assessment:  (unable to formally assess but pt appears to be WFLs based on functional tasks) LUE Assessment LUE Assessment: Within Functional Limits (unable to formally assess but pt appears to be WFLs based on performance with functional tasks)  FIM:  FIM - Eating Eating Activity: 6: More than reasonable amount of time FIM - Grooming Grooming Steps: Wash, rinse, dry face;Brush, comb hair Grooming: 4: Steadying assist  or patient completes 3 of 4 or 4 of 5 steps FIM - Bathing Bathing Steps Patient Completed: Chest;Right Arm;Left Arm;Abdomen;Front perineal area;Right upper leg;Left upper leg Bathing: 3: Mod-Patient completes 5-7 [id number] parts or 50-74% FIM - Upper Body Dressing/Undressing Upper body dressing/undressing steps patient completed: Thread/unthread right sleeve of pullover shirt/dresss;Thread/unthread left sleeve of pullover shirt/dress;Put head through opening of  pull over shirt/dress;Pull shirt over trunk Upper body dressing/undressing: 5: Set-up assist to: Obtain clothing/put away FIM - Lower Body Dressing/Undressing Lower body dressing/undressing steps patient completed: Thread/unthread right underwear leg;Thread/unthread left underwear leg;Pull underwear up/down;Thread/unthread right pants leg;Thread/unthread left pants leg;Pull pants up/down;Don/Doff right shoe;Don/Doff left shoe;Fasten/unfasten right shoe;Fasten/unfasten left shoe Lower body dressing/undressing: 4: Min-Patient completed 75 plus % of tasks FIM - Toileting Toileting steps completed by patient: Adjust clothing prior to toileting;Performs perineal hygiene;Adjust clothing after toileting Toileting: 4: Steadying assist FIM - BEngineer, siteAssistive Devices: Arm rests;Bed rails Bed/Chair Transfer: 5: Supine > Sit: Supervision (verbal cues/safety issues);4: Bed > Chair or W/C: Min A (steadying Pt. > 75%) FIM - TRadio producerDevices:  (Pt standing with steady assist to urinate)   Refer to Care Plan for Long Term Goals  Recommendations for other services: None  Discharge Criteria: Patient will be discharged from OT if patient refuses treatment 3 consecutive times without medical reason, if treatment goals not met, if there is a change in medical status, if patient makes no progress towards goals or if patient is discharged from hospital.  The above assessment, treatment plan, treatment alternatives and goals were discussed and mutually agreed upon: by patient  PPhineas Semen8/04/2014, 10:42 AM

## 2014-09-30 NOTE — Evaluation (Signed)
Physical Therapy Assessment and Plan  Patient Details  Name: [name] MRN: [id number] Date of Birth: 11/15/1955  PT Diagnosis: Abnormality of gait, Coordination disorder, Hemiparesis dominant and Impaired cognition Rehab Potential: Good ELOS: 7-10 days   Today's Date: 09/30/2014 PT Individual Time: 0900-1005 PT Individual Time Calculation (min): 65 min    Problem List:  Patient Active Problem List   Diagnosis Date Noted  . 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 Left middle cerebral artery stroke 09/29/2014  . Acute ischemic left MCA stroke   . ICAO (internal carotid artery occlusion)   . Carotid stenosis   . CVA (cerebral infarction) 09/25/2014  . Stroke 09/25/2014  . PAF (paroxysmal atrial fibrillation) 09/25/2014  . Acute CVA (cerebrovascular accident)   . SOB (shortness of breath) 05/19/2014  . Heart palpitations 05/19/2014  . Atrial fibrillation     Past Medical History:  Past Medical History  Diagnosis Date  . Atrial fibrillation   . Basal cell carcinoma   . Nephrolithiasis    Past Surgical History:  Past Surgical History  Procedure Laterality Date  . Lithotripsy    . Vasectomy      Assessment & Plan Clinical Impression: [name] is a 59 y.o. right handed male with history of basal cell carcinoma, atrial fibrillation maintained on aspirin 81 mg daily. Independent prior to admission living with his wife and active. Admitted 09/25/2014. Patient had undergone right ear tympanoplasty. Noted post procedure to have altered mental status and difficulty communicating. 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 TPA not given. Patient transferred to CIR on 09/29/2014 .   Patient currently requires min with mobility secondary to impaired timing and sequencing, unbalanced muscle activation, motor apraxia, decreased coordination and decreased motor  planning.  Prior to hospitalization, patient was independent  with mobility and lived with Spouse in a House.  Home access is 5 Stairs to enter. Bedroom/bathroom are on 2nd floor with full flight of stairs and L hand rail to access.   Overall patient requiring max multimodal cuing for all activities 2/2 inattention, easy distractibility, receptive aphasia, and apraxia.  Patient will benefit from skilled PT intervention to maximize safe functional mobility, minimize fall risk and decrease caregiver burden for planned discharge home with 24 hour supervision.  Anticipate patient will benefit from follow up OP at discharge.    PT - End of Session Activity Tolerance: Tolerates 30+ min activity with multiple rests PT Assessment Rehab Potential (ACUTE/IP ONLY): Good PT Patient demonstrates impairments in the following area(s): Balance;Motor;Safety PT Transfers Functional Problem(s): Bed Mobility;Bed to Chair;Car;Furniture;Floor PT Locomotion Functional Problem(s): Ambulation;Stairs PT Plan PT Intensity: Minimum of 1-2 x/day ,45 to 90 minutes PT Frequency: 5 out of 7 days PT Duration Estimated Length of Stay: 7-10 days PT Treatment/Interventions: Ambulation/gait training;Balance/vestibular training;Cognitive remediation/compensation;Community reintegration;Discharge planning;Functional mobility training;Patient/family education;Neuromuscular re-education;Therapeutic Exercise;Therapeutic Activities;Stair training;UE/LE Strength taining/ROM;UE/LE Coordination activities;Visual/perceptual remediation/compensation PT Transfers Anticipated Outcome(s): supervision for transfers PT Locomotion Anticipated Outcome(s): supervision for gait PT Recommendation Follow Up Recommendations: Outpatient PT Patient destination: Home Equipment Recommended: None recommended by PT  Skilled Therapeutic Intervention  Gait Training: Pt ambulated from room to therapy gym and back with steady A and occasional min A for LOB  and obstacle negotiation. Pt negotiate 4 steps with 2 handrails, min A to ascend and mod A to descend. Step to pattern forward negotiation.   Therapeutic Activity: Pt performed sit<>stand transfers  from varying surfaces with steady A for safety, occasional min A to recover balance once standing. Pt performed car transfer with steady A.  Pt participated in balance activity with forced use of RUE to hit beach ball with tennis racket. PT standing to L and restricting use of LUE to force attention to R side.   TUG: 23.1 seconds holding cup of water in R hand.   PT Evaluation Precautions/Restrictions Precautions Precautions: Fall Precaution Comments: pt with both expressive and receptive difficulties, expressive worse than receptive Restrictions Weight Bearing Restrictions: No General   Vital Signs  Pain Pain Assessment Pain Assessment: 0-10 Pain Score: 4  Pain Type: Acute pain Pain Location: Head Pain Orientation: Left;Anterior Pain Descriptors / Indicators: Aching Pain Onset: Awakened from sleep Patients Stated Pain Goal: 2 Pain Intervention(s): Medication (See eMAR) Multiple Pain Sites: No Home Living/Prior Functioning Home Living Living Arrangements: Spouse/significant other Available Help at Discharge: Family Type of Home: House Home Access: Stairs to enter Technical brewer of Steps: 5 Entrance Stairs-Rails: Can reach both;Left;Right Home Layout: Multi-level;Bed/bath upstairs Alternate Level Stairs-Number of Steps: full flight +3 steps Alternate Level Stairs-Rails: Left Bathroom Shower/Tub: Tub/shower unit;Walk-in shower;Other (comment);Curtain;Door [employer] Toilet: Standard Bathroom Accessibility: Yes  Lives With: Spouse Prior Function Level of Independence: Independent with basic ADLs;Independent with gait  Able to Take Stairs?: Reciprically Driving: Yes Vocation: Full time employment Leisure: Hobbies-yes (Comment) Comments: active in 3 tennis leagues     Cognition Overall Cognitive Status: Impaired/Different from baseline Arousal/Alertness: Awake/alert Orientation Level: Other (comment) (difficult to assess 2/2 global aphasia, patient answers to name) Awareness:  (aware of speech deficits and motor planning deficits) Problem Solving: Impaired Problem Solving Impairment: Verbal basic;Functional basic Safety/Judgment: Impaired (very impulsive) Sensation Sensation Light Touch: Appears Intact (difficult to assess 2/2 global aphasia, pt looks at LEs when therapist touches feet/calves/knees) Coordination Gross Motor Movements are Fluid and Coordinated: No Fine Motor Movements are Fluid and Coordinated: No Finger Nose Finger Test: impaired R>L Heel Shin Test: impaired accuracy, fluid movement Motor  Motor Motor: Motor apraxia Motor - Skilled Clinical Observations: apraxia  Mobility Bed Mobility Bed Mobility: Sit to Supine;Supine to Sit Supine to Sit: 4: Min guard Transfers Sit to Stand: 4: Min guard Sit to Stand Details: Tactile cues for sequencing;Visual cues/gestures for precautions/safety Stand to Sit: 4: Min guard Stand to Sit Details (indicate cue type and reason): Visual cues/gestures for precautions/safety Locomotion  Ambulation Ambulation: Yes Ambulation/Gait Assistance: 4: Min guard Ambulation Distance (Feet): 150 Feet Assistive device: None Gait Gait: Yes Gait Pattern: Impaired Gait Pattern: Step-through pattern;Decreased step length - right;Decreased step length - left;Decreased hip/knee flexion - right;Narrow base of support (mid guard, decreased arm swing) Stairs / Additional Locomotion Stairs: Yes Stairs Assistance: 3: Mod assist Stairs Assistance Details: Tactile cues for sequencing;Tactile cues for weight shifting;Tactile cues for placement Stairs Assistance Details (indicate cue type and reason): min A to ascend, mod A to descend 2/2 pt using stronger LLE to descend first Stair Management Technique: Two  rails;Forwards;Step to pattern Number of Stairs: 4 Ramp: Not tested (comment) Wheelchair Mobility Wheelchair Mobility: No (pt ambulatory with no AD)  Trunk/Postural Assessment  Cervical Assessment Cervical Assessment: Exceptions to [facility] (slightly forward head) Thoracic Assessment Thoracic Assessment: Within Functional Limits Lumbar Assessment Lumbar Assessment: Within Functional Limits  Balance Balance Balance Assessed: Yes Standardized Balance Assessment Standardized Balance Assessment: Timed Up and Go Test Timed Up and Go Test TUG: Manual TUG Manual TUG (seconds): 23.1 Static Sitting Balance Static Sitting - Balance Support: Feet supported;No upper extremity  supported Static Sitting - Level of Assistance: 6: Modified independent (Device/Increase time) Dynamic Sitting Balance Dynamic Sitting - Balance Support: Feet supported;No upper extremity supported Dynamic Sitting - Level of Assistance: 5: Stand by assistance Dynamic Sitting - Balance Activities: Lateral lean/weight shifting;Forward lean/weight shifting;Reaching for objects Static Standing Balance Static Standing - Balance Support: No upper extremity supported Static Standing - Level of Assistance: 4: Min assist;5: Stand by assistance Dynamic Standing Balance Dynamic Standing - Balance Support: No upper extremity supported Dynamic Standing - Level of Assistance: 4: Min assist Dynamic Standing - Balance Activities: Lateral lean/weight shifting;Forward lean/weight shifting;Reaching for objects Extremity Assessment    RLE Assessment RLE Assessment: Exceptions to [facility] RLE Strength RLE Overall Strength Comments: difficult to assess 2/2 apraxia and global aphasia, grossly 3+/5 LLE Assessment LLE Assessment: Exceptions to [facility] LLE Strength LLE Overall Strength Comments: difficult to formally assess 2/2 apraxia and global aphasia, grossly 3+/5  FIM:  FIM - Bed/Chair Transfer Bed/Chair Transfer Assistive Devices: Arm  rests;Bed rails Bed/Chair Transfer: 5: Supine > Sit: Supervision (verbal cues/safety issues);5: Sit > Supine: Supervision (verbal cues/safety issues);4: Bed > Chair or W/C: Min A (steadying Pt. > 75%);4: Chair or W/C > Bed: Min A (steadying Pt. > 75%) FIM - Locomotion: Wheelchair Locomotion: Wheelchair: 0: Activity did not occur (pt ambulating in unit without AD) FIM - Locomotion: Ambulation Locomotion: Ambulation Assistive Devices:  (none) Ambulation/Gait Assistance: 4: Min guard Locomotion: Ambulation: 4: Travels 150 ft or more with minimal assistance (Pt.>75%) FIM - Locomotion: Stairs Locomotion: Scientist, physiological: Hand rail - 2 Locomotion: Stairs: 2: Up and Down 4 - 11 stairs with moderate assistance (Pt: 50 - 74%)   Refer to Care Plan for Long Term Goals  Recommendations for other services: None  Discharge Criteria: Patient will be discharged from PT if patient refuses treatment 3 consecutive times without medical reason, if treatment goals not met, if there is a change in medical status, if patient makes no progress towards goals or if patient is discharged from hospital.  The above assessment, treatment plan, treatment alternatives and goals were discussed and mutually agreed upon: by patient and by family  Urban Gibson E Penven-Crew 09/30/2014, 12:40 PM

## 2014-09-30 NOTE — Progress Notes (Signed)
Patient information reviewed and entered into eRehab system by Christle Nolting, RN, CRRN, PPS Coordinator.  Information including medical coding and functional independence measure will be reviewed and updated through discharge.    

## 2014-09-30 NOTE — Evaluation (Addendum)
Speech Language Pathology Assessment and Plan  Patient Details  Name: [name] MRN: [id number] Date of Birth: January 10, 1956  SLP Diagnosis: Aphasia;Cognitive Impairments  Rehab Potential: Good ELOS: 7-10 days     Today's Date: 09/30/2014 SLP Individual Time: 1100-1200 SLP Individual Time Calculation (min): 60 min   Problem List:  Patient Active Problem List   Diagnosis Date Noted  . 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 Left middle cerebral artery stroke 09/29/2014  . Acute ischemic left MCA stroke   . ICAO (internal carotid artery occlusion)   . Carotid stenosis   . CVA (cerebral infarction) 09/25/2014  . Stroke 09/25/2014  . PAF (paroxysmal atrial fibrillation) 09/25/2014  . Acute CVA (cerebrovascular accident)   . SOB (shortness of breath) 05/19/2014  . Heart palpitations 05/19/2014  . Atrial fibrillation    Past Medical History:  Past Medical History  Diagnosis Date  . Atrial fibrillation   . Basal cell carcinoma   . Nephrolithiasis    Past Surgical History:  Past Surgical History  Procedure Laterality Date  . Lithotripsy    . Vasectomy      Assessment / Plan / Recommendation Clinical Impression   [name] is a 59 y.o. right handed male with history of basal cell carcinoma, atrial fibrillation.  Admitted 09/25/2014. Patient had undergone right ear tympanoplasty. Noted post procedure to have altered mental status and difficulty communicating. 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 MRI of the brain shows left middle cerebral artery territory infarct.  Patient was admitted for comprehensive rehabilitation program 09/29/2014.  SLP evaluation was completed on 09/30/2014 with the following results: Pt presents with a severe Wernicke's aphasia characterized by fluent verbal output with neologistic jargon, perseveration, and agrammatic sentence  formulation.  Pt had significant difficulty answering basic, biographical yes/no questions and required gestural cues to follow 1 step commands in a functional context.  Informally, pt also presents with significant cognitive deficits characterized by poor intellectual awareness of deficits, right inattention, impulsivity, decreased safety awareness and decreased functional problem solving.  Also suspect that pt's memory is impaired due to receptive language deficits.  Pt would benefit from skilled ST while inpatient in order to maximize functional independence and reduce burden of care prior to discharge. Anticipate that pt will require extensive 24/7 supervision and assistance for medication and financial management in addition to ST follow up at next level of care.    Skilled Therapeutic Interventions          Cognitive-linguistic evaluation completed with results and recommendations reviewed with patient and family.     SLP Assessment  Patient will need skilled [facility] during CIR admission    Recommendations  Patient destination: Home Follow up Recommendations: 24 hour supervision/assistance;Outpatient SLP;Home Health SLP Equipment Recommended: None recommended by SLP    SLP Frequency 3 to 5 out of 7 days   SLP Treatment/Interventions Cognitive remediation/compensation;Cueing hierarchy;Environmental controls;Internal/external aids;Functional tasks;Multimodal communication approach;Patient/family education    Pain Pain Assessment Pain Assessment: No/denies pain  Prior Functioning Cognitive/Linguistic Baseline: Within functional limits Type of Home: House  Lives With: Spouse Available Help at Discharge: Family Vocation: Full time employment  Short Term Goals: Week 1: SLP Short Term Goal 1 (Week 1): STG=LTG due to ELOS   See FIM for current functional status Refer to Care Plan for Long Term Goals  Recommendations for other services: None  Discharge  Criteria: Patient will be discharged from SLP if patient refuses treatment 3 consecutive times without medical reason, if treatment goals not met, if there is a change in medical status, if patient makes no progress towards goals or if patient is discharged from hospital.  The above assessment, treatment plan, treatment alternatives and goals were discussed and mutually agreed upon: by patient and by family  Emilio Math 09/30/2014, 12:57 PM

## 2014-09-30 NOTE — Progress Notes (Signed)
[facility] PHYSICAL MEDICINE & REHABILITATION     PROGRESS NOTE    Subjective/Complaints: Got to sleep late. Told me that blood draw was difficult this morning.   Otherwise no major complaints--ROS limited by his aphasia  Objective: Vital Signs: Blood pressure 116/67, pulse 65, temperature 97.7 F (36.5 C), temperature source Oral, resp. rate 20, height 5\' 10"  (1.778 m), weight 89.3 kg (196 lb 13.9 oz), SpO2 100 %. No results found. No results for input(s): WBC, HGB, HCT, PLT in the last 72 hours. No results for input(s): NA, K, CL, GLUCOSE, BUN, CREATININE, CALCIUM in the last 72 hours.  Invalid input(s): CO CBG (last 3)   Recent Labs  09/27/14 2101  GLUCAP 78    Wt Readings from Last 3 Encounters:  09/30/14 89.3 kg (196 lb 13.9 oz)  09/26/14 98.8 kg (217 lb 13 oz)  05/28/14 93.441 kg (206 lb)    Physical Exam:  Constitutional: He appears well-developed.  HENT: dentition fair to good, oral mucosa pink and moist Head: Normocephalic.  Eyes: EOM are normal.  Neck: Normal range of motion. Neck supple. No thyromegaly present.  Cardiovascular:  Cardiac rate control without murmur Respiratory: Effort normal and breath sounds normal. No respiratory distress. No wheezes or rales GI: Soft. Bowel sounds are normal. He exhibits no distension.  Neurological: He is alert. No focal CN abnl.  Patient with expressive > receptive aphasia with apraxia. He would follow some simple one-step demonstrate commands but inconsistently. MMT: Right--- deltoid, biceps, triceps, wrist, HI, HF, KE, ADF, APF 4 to 4+/5 with decreased Gorman. LUE and LLE 5/5 prox to distal. Sensory intact to paint and gross touch in all 4. dtr's 2+. Perseverates on certain tasks and phrases---- needs substantial cueing to shift from one task to another--somewhat improved from yesterday.  Skin: Skin is warm and dry. Sutures removed from for head lesion excision site clean and dry  Psychiatric: He has a normal mood  and affect. His behavior is normal   Assessment/Plan: 1. Functional deficits secondary to embolic left MCA infarct which require 3+ hours per day of interdisciplinary therapy in a comprehensive inpatient rehab setting. Physiatrist is providing close team supervision and 24 hour management of active medical problems listed below. Physiatrist and rehab team continue to assess barriers to discharge/monitor patient progress toward functional and medical goals. FIM:                   Comprehension Comprehension Mode: Auditory Comprehension: 5-Understands basic 90% of the time/requires cueing < 10% of the time  Expression Expression Mode: Verbal Expression: 3-Expresses basic 50 - 74% of the time/requires cueing 25 - 50% of the time. Needs to repeat parts of sentences.  Social Interaction Social Interaction: 3-Interacts appropriately 50 - 74% of the time - May be physically or verbally inappropriate.  Problem Solving Problem Solving: 5-Solves basic 90% of the time/requires cueing < 10% of the time  Memory Memory: 4-Recognizes or recalls 75 - 89% of the time/requires cueing 10 - 24% of the time  Medical Problem List and Plan: 1. Functional deficits secondary to embolic left MCA infarct (?ICA vs cardiac etiology) with expressive receptive aphasia after right ear tympanoplasty 09/24/2014 2. DVT Prophylaxis/Anticoagulation: Eliquis required due to afib. No current bleeding issues 3. Pain Management: Tylenol as needed. No pain at present 4. Hypertension/atrial fibrillation. Lopressor 12.5 mg twice a day. HR controlled, regular rhythm 5. Neuropsych: This patient is not capable of making decisions on his own behalf. 6. Skin/Wound Care: Routine  skin checks on admit and throughout stay 7. Fluids/Electrolytes/Nutrition: Routine I&O. follow-up chemistries as available. Encourage po 8. Hyperlipidemia. Lipitor for secondary stroke prevention 9. Right ICA stenosis. To be followed-up  outpatient with vascular surgery. Left ICA occluded  LOS (Days) 1 A FACE TO FACE EVALUATION WAS PERFORMED  Yaneth Fairbairn T 09/30/2014 9:12 AM

## 2014-09-30 NOTE — Progress Notes (Signed)
Social Work Assessment and Plan Social Work Assessment and Plan  Patient Details  Name: [name] MRN: [id number] Date of Birth: 30-Nov-1955  Today's Date: 09/30/2014  Problem List:  Patient Active Problem List   Diagnosis Date Noted  . 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 Left middle cerebral artery stroke 09/29/2014  . Acute ischemic left MCA stroke   . ICAO (internal carotid artery occlusion)   . Carotid stenosis   . CVA (cerebral infarction) 09/25/2014  . Stroke 09/25/2014  . PAF (paroxysmal atrial fibrillation) 09/25/2014  . Acute CVA (cerebrovascular accident)   . SOB (shortness of breath) 05/19/2014  . Heart palpitations 05/19/2014  . Atrial fibrillation    Past Medical History:  Past Medical History  Diagnosis Date  . Atrial fibrillation   . Basal cell carcinoma   . Nephrolithiasis    Past Surgical History:  Past Surgical History  Procedure Laterality Date  . Lithotripsy    . Vasectomy     Social History:  reports that he has never smoked. He does not have any smokeless tobacco history on file. He reports that he drinks alcohol. He reports that he does not use illicit drugs.  Family / Support Systems Marital Status: Married Patient Roles: Spouse, Parent, Other (Comment) (Employee) Spouse/Significant Other: [name] [phone]-cell Children: Shawn-son [phone]-cell  and another son-both located in [place] Other Supports: Friends and extended family in [place] Anticipated Caregiver: Unsure at this time-wife realized FMLA not paid leave Ability/Limitations of Caregiver: Wife works full time and son's are in [place] working fulltime Caregiver Availability: Other (Purdy) (Coming up with a discharge plan-aware will need 24 hr care) Family Dynamics: Close knit family who are supportive of one another and involved. Both sons are in [place] could work remotely a couple days a week. They also have family in [place] who  are retired who may be able to assist.  Wife to work on plan  Social History Preferred language: English Religion:  Cultural Background: No issues Education: Secretary/administrator educated Read: Yes Write: Yes Employment Status: Employed Name of Employer: [employer] Return to Work Plans: Would like to return to work,but need sot be able to Advice worker Issues: No issues Guardian/Conservator: None-according to MD pt is not capable to make his own decisions while here. Will look toward his wife since she is next of kin to make any decisions while here.   Abuse/Neglect Physical Abuse: Denies Verbal Abuse: Denies Sexual Abuse: Denies Exploitation of patient/patient's resources: Denies Self-Neglect: Denies  Emotional Status Pt's affect, behavior adn adjustment status: Pt is motivated to improve, he has always been active and self sufficient. He is not one to sit still and wait for others. His wife is willing to assist him but concerned due to she needs to work and can not take a Event organiser. She is aware he will need 24 hr at discharge. Recent Psychosocial Issues: Other health issues-recovering from ear surgery a few days ago Pyschiatric History: No history deferred depression due to his speech deficits, seems to be willing to do whatever he needs to do to improve. Will monitor and have neuro-psych see when appropriate due to pt's young age and deficits. Substance Abuse History: No issues  Patient / Family Perceptions, Expectations & Goals Pt/Family understanding of illness & functional limitations: Pt seems to understand his stroke unsure if he is aware of his deficits. His wife has spoken with the MD and is aware and  hopeful he will do well here. It is still hitting her that he had a stroke and his deficits. Premorbid pt/family roles/activities: Husband, Cabin crew, father, church member, Health visitor. etc Anticipated changes in roles/activities/participation: resume Pt/family  expectations/goals: Pt states: " Come on I'll go."  Wife states: " I hope he can get his speech back, he is moving well, some balance issues."    [employer]: None Premorbid Home Care/DME Agencies: None Transportation available at discharge: Wife Resource referrals recommended: Neuropsychology, Support group (specify)  Discharge Planning Living Arrangements: Spouse/significant other Support Systems: Spouse/significant other, Children, Other relatives, Water engineer, Social worker community Type of Residence: Private residence Insurance underwriter Resources: Multimedia programmer (specify) Nurse, mental health) Financial Resources: Employment, Secondary school teacher Screen Referred: No Living Expenses: Own Money Management: Spouse, Patient Does the patient have any problems obtaining your medications?: No Home Management: Wife Patient/Family Preliminary Plans: Return home wife is in the process of looking at her options she is aware he will need someone with him at home. She doesn't want to take a FMLA due to loss of income, their son may be able to assist with two days of coverage and they have family in [place] who are retired that may be able to assist.  Social Work Anticipated Follow Up Needs: HH/OP, Support Group  Clinical Impression Pleasant gentleman who has speech deficits but is very motivated to improve. Wife is here to observe in therapies and see his progress, she is till processing all that has happened. She is aware he will need 24 hr care but is looking into her  Options, she doesn't want to have to take a FMLA due to loss of income, son can possibly assist and wife to contact family in [place] about coming to help. Work on discharge planning and provide support.  Elease Hashimoto 09/30/2014, 11:54 AM

## 2014-09-30 NOTE — Patient Care Conference (Signed)
Inpatient RehabilitationTeam Conference and Plan of Care Update Date: 09/30/2014   Time: 10;40 AM    Patient Name: [name]      Medical Record Number: [id number]  Date of Birth: 1956-02-10 Sex: Male         Room/Bed: 4M04C/4M04C-01 Payor Info: Payor: BLUE CROSS BLUE SHIELD / Plan: BCBS OTHER / Product Type: *No Product type* /    Admitting Diagnosis: nih 8  Admit Date/Time:  09/29/2014  4:23 PM Admission Comments: No comment available   Primary Diagnosis:  Embolism of left middle cerebral artery Principal Problem: Embolism of left middle cerebral artery  Patient Active Problem List   Diagnosis Date Noted  . 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 Left middle cerebral artery stroke 09/29/2014  . Acute ischemic left MCA stroke   . ICAO (internal carotid artery occlusion)   . Carotid stenosis   . CVA (cerebral infarction) 09/25/2014  . Stroke 09/25/2014  . PAF (paroxysmal atrial fibrillation) 09/25/2014  . Acute CVA (cerebrovascular accident)   . SOB (shortness of breath) 05/19/2014  . Heart palpitations 05/19/2014  . Atrial fibrillation     Expected Discharge Date: Expected Discharge Date: 09/30/14  Team Members Present: Physician leading conference: Dr. Alger Simons Social Worker Present: Ovidio Kin, LCSW;Jenny Prevatt, LCSW Nurse Present: Heather Roberts, RN;Other (comment) Drucie Opitz) PT Present: Jorge Mandril, PT;Other (comment) Rudene Christians Tygielski-PT) OT Present: Meriel Pica, Jules Schick, OT SLP Present: Windell Moulding, SLP PPS Coordinator present : Daiva Nakayama, RN, CRRN     Current Status/Progress Goal Weekly Team Focus  Medical   left MCA infarct  manage medical issues, bp, post stroke education  bp, hr control, aphasia ed,mgt   Bowel/Bladder   cont. of B+B.using urinal  contofB+Bwithout assistivedevice  toileting;walktoBR   Swallow/Nutrition/ Hydration   On regular textures, thin liquids;  pending ST evaluation          ADL's     eval pending        Mobility     eval pending        Communication   Pending ST evaluation          Safety/Cognition/ Behavioral Observations  Pending ST evaluation          Pain   Ultramq6hrprnwith tylenolbetween  HAcontrolledwithprntylenol  assessprnpainmeduseandneedfortylenolandultram   Skin   suturesbehindrtear/scablftforehead  healingincisionbehindear/forhead  assessincisionrtear/skininspectionqshifttoavoidinjury2/2rtinattention      *See Care Plan and progress notes for long and short-term goals.  Barriers to Discharge: severe aphasia affecting functional tasks, right inattention    Possible Resolutions to Barriers:  continued cognitive-linguistic training, visual-spatial training    Discharge Planning/Teaching Needs:    Home with wife who is trying to come up with 24 hr care-they have relatives in [place] who may be able to help.     Team Discussion:  Evaluating in therapies-speech issues, needs constant verbal cues. R-inattention and motor planning. Wife here to observe in therapies  Revisions to Treatment Plan:  New eval   Continued Need for Acute Rehabilitation Level of Care: The patient requires daily medical management by a physician with specialized training in physical medicine and rehabilitation for the following conditions: Daily direction of a multidisciplinary physical rehabilitation program to ensure safe treatment while eliciting the highest outcome that is of practical value to the patient.: Yes Daily medical management of patient stability for increased activity during participation in an intensive rehabilitation regime.: Yes Daily analysis of laboratory values and/or radiology reports  with any subsequent need for medication adjustment of medical intervention for : Neurological problems;Other  Elease Hashimoto 10/01/2014, 8:39 AM

## 2014-10-01 ENCOUNTER — Inpatient Hospital Stay (HOSPITAL_COMMUNITY): Payer: BLUE CROSS/BLUE SHIELD | Admitting: Speech Pathology

## 2014-10-01 ENCOUNTER — Inpatient Hospital Stay (HOSPITAL_COMMUNITY): Payer: BLUE CROSS/BLUE SHIELD | Admitting: Occupational Therapy

## 2014-10-01 ENCOUNTER — Inpatient Hospital Stay (HOSPITAL_COMMUNITY): Payer: BLUE CROSS/BLUE SHIELD | Admitting: Rehabilitation

## 2014-10-01 DIAGNOSIS — I480 Paroxysmal atrial fibrillation: Secondary | ICD-10-CM

## 2014-10-01 DIAGNOSIS — I6521 Occlusion and stenosis of right carotid artery: Secondary | ICD-10-CM

## 2014-10-01 DIAGNOSIS — I669 Occlusion and stenosis of unspecified cerebral artery: Secondary | ICD-10-CM

## 2014-10-01 DIAGNOSIS — I63512 Cerebral infarction due to unspecified occlusion or stenosis of left middle cerebral artery: Secondary | ICD-10-CM

## 2014-10-01 DIAGNOSIS — I69320 Aphasia following cerebral infarction: Principal | ICD-10-CM

## 2014-10-01 NOTE — Progress Notes (Signed)
Physical Therapy Session Note  Patient Details  Name: [name] MRN: [id number] Date of Birth: [birth year]  Today's Date: 10/01/2014 PT Co-Treatment Time: 1005 (cotreat with SLP (NP) total time 60 minutes)-1035 PT Co-Treatment Time Calculation (min): 30 min  Short Term Goals: Week 1:  PT Short Term Goal 1 (Week 1): STGs=LTGs due to ELOS  Skilled Therapeutic Interventions/Progress Updates:   Co-treat with SLP. PT focused on NMR, attention to R side, use of R side, and dynamic/high level standing balance. Pt threw basketball from left of goal and right of goal to draw pt's focus to R side. Pt played tennis (using beach ball) first with LUE restricted behind back to force use of RUE, then with LUE free and verbal cues to use RUE. Pt able to progress to requiring no cues to use RUE to hit ball with tennis racket. Pt demonstrates good dynamic balance and mobility but continues to be limited by global aphasia and decreased safety awareness. Pt improves attention to R side with repeated use and wife encouraged to continue with this forced use/attention to R side when not in therapy sessions to improve carry-over.     Therapy Documentation Precautions:  Precautions Precautions: Fall Precaution Comments: pt with both expressive and receptive difficulties, expressive worse than receptive Restrictions Weight Bearing Restrictions: No  Pain: Pain Assessment Pain Assessment: No/denies pain Faces Pain Scale: Hurts even more Pain Type: Acute pain Pain Location: Head Pain Orientation: Left Pain Descriptors / Indicators: Headache Pain Frequency: Constant Pain Onset: On-going Patients Stated Pain Goal: 3 Pain Intervention(s): Medication (See eMAR)  Locomotion : Ambulation Ambulation/Gait Assistance: 4: Min guard   See FIM for current functional status  Therapy/Group: Co-Treatment  Sadira Standard E Penven-Crew 10/01/2014, 12:21 PM

## 2014-10-01 NOTE — IPOC Note (Signed)
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   Left middle cerebral artery stroke     Functional Problem List: Nursing Behavior, Bladder, Bowel, Motor, Endurance, Medication Management, Nutrition, Pain, Perception, Safety, Sensory, Skin Integrity  PT Balance, Motor, Safety  OT Balance, Cognition, Endurance, Motor, Safety, Vision  SLP Linguistic, Cognition  TR         Basic ADL's: OT Grooming, Bathing, Dressing, Toileting     Advanced  ADL's: OT  (n/a)     Transfers: PT Bed Mobility, Bed to Chair, Car, Furniture, Futures trader, Metallurgist: PT Ambulation, Stairs     Additional Impairments: OT None  SLP Communication, Social Cognition expression Problem Solving, Awareness  TR      Anticipated Outcomes Item Anticipated Outcome  Self Feeding n/a  Swallowing      Basic self-care  overall supervision  Toileting  overall supervision   Bathroom Transfers overall supervision  Bowel/Bladder  Pt will remain continent of bowel and bladder with min assist during admission   Transfers  supervision for transfers  Locomotion  supervision for gait  Communication  mod assist   Cognition  min assist   Pain  Maintain pain at 4 or less on a scale of 0-10   Safety/Judgment  Pt will remain free of falls and injury while in rehab    Therapy Plan: PT Intensity: Minimum of 1-2 x/day ,45 to 90 minutes PT Frequency: 5 out of 7 days PT Duration Estimated Length of Stay: 7-10 days OT Intensity: Minimum of 1-2 x/day, 45 to 90 minutes OT Frequency: 5 out of 7 days OT Duration/Estimated Length of Stay: 7- 10 days SLP Intensity: Minumum of 1-2 x/day, 30 to 90 minutes SLP Frequency: 3 to 5 out of 7  days SLP Duration/Estimated Length of Stay: 7-10 days        Team Interventions: Nursing Interventions Patient/Family Education, Bladder Management, Bowel Management, Disease Management/Prevention, Pain Management, Medication Management, Skin Care/Wound Management, Dysphagia/Aspiration Precaution Training, Cognitive Remediation/Compensation, Discharge Planning, Psychosocial Support  PT interventions Ambulation/gait training, Training and development officer, Cognitive remediation/compensation, Community reintegration, Discharge planning, Functional mobility training, Patient/family education, Neuromuscular re-education, Therapeutic Exercise, Therapeutic Activities, Stair training, UE/LE Strength taining/ROM, UE/LE Coordination activities, Visual/perceptual remediation/compensation  OT Interventions Training and development officer, Community reintegration, Technical sales engineer stimulation, Patient/family education, Self Care/advanced ADL retraining, Therapeutic Exercise, UE/LE Coordination activities, UE/LE Strength taining/ROM, Therapeutic Activities, Psychosocial support, Functional mobility training, DME/adaptive equipment instruction, Discharge planning  SLP Interventions Cognitive remediation/compensation, Cueing hierarchy, Environmental controls, Internal/external aids, Functional tasks, Multimodal communication approach, Patient/family education  TR Interventions    SW/CM Interventions Discharge Planning, Psychosocial Support, Patient/Family Education    Team Discharge Planning: Destination: PT-Home ,OT- Home , SLP-Home Projected Follow-up: PT-Outpatient PT, OT-  Outpatient OT, 24 hour supervision/assistance, SLP-24 hour supervision/assistance, Outpatient SLP, Home Health SLP Projected Equipment Needs: PT-None recommended by PT, OT- Tub/shower seat, SLP-None recommended by SLP Equipment Details: PT- , OT-  Patient/family involved in discharge planning: PT- Family member/caregiver,  OT-Patient,  SLP-Patient, Family member/caregiver  MD ELOS: 7-10 days Medical Rehab Prognosis:  Excellent Assessment: The patient has been admitted for CIR therapies with the diagnosis of CVA. The team will be addressing functional mobility, strength, stamina, balance, safety, adaptive techniques and equipment, self-care, bowel  and bladder mgt, patient and caregiver education, language, communication, cognition, visual-spatial awareness, pain control . Goals have been set at supervision for basic self-care and mobility and min assist for cognition, mod assist for language.    Meredith Staggers, MD, FAAPMR      See Team Conference Notes for weekly updates to the plan of care

## 2014-10-01 NOTE — Progress Notes (Signed)
[facility]ICAL [facility]IC[facility] RE[facility]ABILITATION     PROGRESS NOTE    Subjective/Complaints: Slept better. Pt indicates he's frustrated by his vision. Has good appetite  Otherwise no major complaints--ROS very limited by his aphasia  Objective: Vital Signs: Blood pressure 127/68, pulse 55, temperature 97.7 F (36.5 C), temperature source Oral, resp. rate 20, height 5\' 10"  (1.778 m), weight 89.3 kg (196 lb 13.9 oz), SpO2 96 %. No results found.  Recent Labs  09/30/14 1305  WBC 9.0  HGB 14.5  HCT 41.9  PLT 228    Recent Labs  09/30/14 1305  NA 137  K 4.1  CL 102  GLUCOSE 108*  BUN 13  CREATININE 0.85  CALCIUM 9.3   CBG (last 3)  No results for input(s): GLUCAP in the last 72 hours.  Wt Readings from Last 3 Encounters:  09/30/14 89.3 kg (196 lb 13.9 oz)  09/26/14 98.8 kg (217 lb 13 oz)  05/28/14 93.441 kg (206 lb)    Physical Exam:  Constitutional: He appears well-developed.  HENT: dentition fair to good, oral mucosa pink and moist Head: Normocephalic.  Eyes: EOM are normal.  Neck: Normal range of motion. Neck supple. No thyromegaly present.  Cardiovascular:  Cardiac rate controlled without murmur Respiratory: Effort normal and breath sounds normal. No respiratory distress. No wheezes or rales GI: Soft. Bowel sounds are normal. He exhibits no distension.  Neurological: He is alert. Dense right HH Patient with expressive > receptive aphasia with apraxia. He would follow some simple one-step demonstrate commands but inconsistently. MMT: Right--- deltoid, biceps, triceps, wrist, HI, HF, KE, ADF, APF 4 to 4+/5 with decreased Bandana. LUE and LLE 5/5 prox to distal. Sensory intact to paint and gross touch in all 4. dtr's 2+. Perseverates on certain tasks and phrases---- still needs cueing to shift from one task to another.  Skin: Skin is warm and dry. Sutures removed from for head lesion excision site clean and dry  Psychiatric: He has a normal mood and affect. His  behavior is normal   Assessment/Plan: 1. Functional deficits secondary to embolic left MCA infarct which require 3+ hours per day of interdisciplinary therapy in a comprehensive inpatient rehab setting. Physiatrist is providing close team supervision and 24 hour management of active medical problems listed below. Physiatrist and rehab team continue to assess barriers to discharge/monitor patient progress toward functional and medical goals. FIM: FIM - Bathing Bathing Steps Patient Completed: Chest, Right Arm, Left Arm, Abdomen, Front perineal area, Right upper leg, Left upper leg, Left lower leg (including foot), Right lower leg (including foot) Bathing: 4: Min-Patient completes 8-9 40f 10 parts or 75+ percent  FIM - Upper Body Dressing/Undressing Upper body dressing/undressing steps patient completed: Thread/unthread right sleeve of pullover shirt/dresss, Thread/unthread left sleeve of pullover shirt/dress, Put head through opening of pull over shirt/dress, Pull shirt over trunk Upper body dressing/undressing: 5: Set-up assist to: Obtain clothing/put away FIM - Lower Body Dressing/Undressing Lower body dressing/undressing steps patient completed: Thread/unthread right underwear leg, Thread/unthread left underwear leg, Pull underwear up/down, Thread/unthread right pants leg, Thread/unthread left pants leg, Pull pants up/down, Don/Doff right shoe, Don/Doff left shoe, Fasten/unfasten right shoe, Fasten/unfasten left shoe Lower body dressing/undressing: 4: Steadying Assist  FIM - Toileting Toileting steps completed by patient: Adjust clothing prior to toileting, Performs perineal hygiene, Adjust clothing after toileting Toileting: 4: Steadying assist  FIM - Radio producer Devices:  (Pt standing with steady assist to urinate)  FIM - Control and instrumentation engineer  Devices: Arm rests, Bed rails Bed/Chair Transfer: 5: Supine > Sit: Supervision (verbal  cues/safety issues), 5: Sit > Supine: Supervision (verbal cues/safety issues), 4: Bed > Chair or W/C: Min A (steadying Pt. > 75%), 4: Chair or W/C > Bed: Min A (steadying Pt. > 75%)  FIM - Locomotion: Wheelchair Locomotion: Wheelchair: 0: Activity did not occur (pt ambulating in unit without AD) FIM - Locomotion: Ambulation Locomotion: Ambulation Assistive Devices:  (none) Ambulation/Gait Assistance: 4: Min guard Locomotion: Ambulation: 4: Travels 150 ft or more with minimal assistance (Pt.>75%)  Comprehension Comprehension Mode: Auditory Comprehension: 2-Understands basic 25 - 49% of the time/requires cueing 51 - 75% of the time  Expression Expression Mode: Verbal Expression: 2-Expresses basic 25 - 49% of the time/requires cueing 50 - 75% of the time. Uses single words/gestures.  Social Interaction Social Interaction: 3-Interacts appropriately 50 - 74% of the time - May be physically or verbally inappropriate.  Problem Solving Problem Solving: 2-Solves basic 25 - 49% of the time - needs direction more than half the time to initiate, plan or complete simple activities  Memory Memory: 2-Recognizes or recalls 25 - 49% of the time/requires cueing 51 - 75% of the time  Medical Problem List and Plan: 1. Functional deficits secondary to embolic left MCA infarct (?ICA vs cardiac etiology) with expressive receptive aphasia after right ear tympanoplasty 09/24/2014  -remains impaired quite a bit by his right HH and lack of ability to compensate for it in space. 2. DVT Prophylaxis/Anticoagulation: Eliquis required due to afib. No current bleeding issues 3. Pain Management: Tylenol as needed. No pain at present 4. Hypertension/atrial fibrillation. Lopressor 12.5 mg twice a day. HR controlled, regular rhythm 5. Neuropsych: This patient is not capable of making decisions on his own behalf. 6. Skin/Wound Care: Routine skin checks on admit and throughout stay 7. Fluids/Electrolytes/Nutrition:  eating fairly well. Labs reviewed and normal. 8. Hyperlipidemia. Lipitor for secondary stroke prevention 9. Right ICA stenosis. To be followed-up outpatient with vascular surgery. Left ICA occluded  LOS (Days) 2 A FACE TO FACE EVALUATION WAS PERFORMED  SWARTZ,ZACHARY T 10/01/2014 8:36 AM

## 2014-10-01 NOTE — Progress Notes (Signed)
Speech Language Pathology Daily Session Note  Patient Details  Name: [name] MRN: [id number] Date of Birth: February 23, 1956  Today's Date: 10/01/2014 SLP Individual Time: 1500-1530 SLP Individual Time Calculation (min): 30 min  Short Term Goals: Week 1: SLP Short Term Goal 1 (Week 1): STG=LTG due to ELOS   Skilled Therapeutic Interventions:  Pt was seen for skilled ST targeting goals for communication.  Upon arrival, pt was seated upright in recliner, awake, alert, and agreeable to participate in [place].  Upon SLP entering the room, pt exclaimed "Hey, are you ready to go!"  Pt ambulated to ST treatment room with mod-max assist verbal, visual, and tactile cues for awareness of obstacles on the right side.  SLP administered portions of the WAB for ongoing diagnostic assessment of communication.  Pt was unable to identify numbers from a field of 6 or when narrowed to a field of three despite max-total assist cues.  Pt was able to match numbers from a field of three with max assist multimodal cues for awareness of verbal errors.  Throughout session, pt required max assist for regulation of tangential verbal output. Pt was left in room with wife and son present at bedside.  Continue per current plan of care.    FIM:  Comprehension Comprehension Mode: Auditory Comprehension: 2-Understands basic 25 - 49% of the time/requires cueing 51 - 75% of the time Expression Expression Mode: Verbal Expression: 2-Expresses basic 25 - 49% of the time/requires cueing 50 - 75% of the time. Uses single words/gestures. Social Interaction Social Interaction: 3-Interacts appropriately 50 - 74% of the time - May be physically or verbally inappropriate. Problem Solving Problem Solving: 2-Solves basic 25 - 49% of the time - needs direction more than half the time to initiate, plan or complete simple activities Memory Memory: 2-Recognizes or recalls 25 - 49% of the time/requires cueing 51 - 75% of the time  Pain Pain  Assessment Pain Assessment: No/denies pain  Therapy/Group: Individual Therapy  [name], Selinda Orion 10/01/2014, 4:23 PM

## 2014-10-01 NOTE — Progress Notes (Signed)
Physical Therapy Session Note  Patient Details  Name: [name] MRN: [id number] Date of Birth: 08-03-1955  Today's Date: 10/01/2014 PT Individual Time: 0800-0900 PT Individual Time Calculation (min): 60 min   Short Term Goals: Week 1:  PT Short Term Goal 1 (Week 1): STGs=LTGs due to ELOS  Skilled Therapeutic Interventions/Progress Updates:   Pt received sitting in recliner in room,agreeable to therapy session.  Pt ambulated to/from therapy gym without AD at min/guard level as well as throughout session.  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  Once in therapy gym, worked on following single step commands with use of repetition, forced use of RUE, scanning R and recall of instructions from therapist.  Pt continues to be very aphasic, however with increased repetition of activity could carryover with mod cues 75% of time, however when only beginning task, needs more total/HOH cues for task with cues to focus on instruction given by therapist.  Pt is able to motion and verbalize somewhat that vision is impaired from about midline to the R indicative of R hemianopsia, however testing very difficult due to aphasia.  Pt also continues to be very perseverative and cannot cease conversation during instruction or during task.  Wife present near end of session to observe.  Pt left in recliner with all needs in reach.   Therapy Documentation Precautions:  Precautions Precautions: Fall Precaution Comments: pt with both expressive and receptive difficulties, expressive worse than receptive Restrictions Weight Bearing Restrictions: No Pain: no reports of pain, no s/s of pain during session.     See FIM for current functional status  Therapy/Group: Individual Therapy  Denice Bors 10/01/2014, 8:28 AM

## 2014-10-01 NOTE — Progress Notes (Signed)
Occupational Therapy Session Note  Patient Details  Name: [name] MRN: [id number] Date of Birth: 03/02/1955  Today's Date: 10/01/2014 OT Individual Time: 0700-0800 OT Individual Time Calculation (min): 60 min    Short Term Goals: Week 1:  OT Short Term Goal 1 (Week 1): STGs=LTGs based on estimated short LOS  Skilled Therapeutic Interventions/Progress Updates:  Upon entering the room, pt supine in bed with no c/o pain. Pt agreeable to OT session. Pt requesting to shower this session. Pt obtaining clothing items with min A for balance. Pt ambulating into bathroom with max verbal cues for initiation and pt stating, "I'm lost. Tell me . Tell me." Pt seated on TTB with min A in walk in shower. Pt did not attempt to stand without assistance showing good safety awareness. However, he required demonstrational cues and mod verbal cues to wash body parts and use soap this session. Pt perseverating on washing UB this session. Pt returned to sitting on EOB for dressing with set up A and steady assist for balance during clothing management. Pt ambulating 200' in hallway with severe L inattention/neglect. Pt walking into objects on R and at midline this session. Therapist having to physically stop pt and turn his head to R for him to correct actions. Pt requiring max verbal cues to attend to task as well this session. Pt unable to path find back to room this session with total A to find room. Pt seated in recliner chair with breakfast tray with mod verbal and demonstrational cues for how to eat times on tray this morning. Call bell and all needed items within reach and pt awaiting PT .  Therapy Documentation Precautions:  Precautions Precautions: Fall Precaution Comments: pt with both expressive and receptive difficulties, expressive worse than receptive Restrictions Weight Bearing Restrictions: No Vital Signs: Therapy Vitals Temp: 97.7 F (36.5 C) Temp Source: Oral Pulse Rate: (!) 55 Resp:  20 BP: 127/68 mmHg Patient Position (if appropriate): Lying Oxygen Therapy SpO2: 96 % O2 Device: Not Delivered  See FIM for current functional status  Therapy/Group: Individual Therapy  Phineas Semen 10/01/2014, 8:21 AM

## 2014-10-01 NOTE — Progress Notes (Signed)
Speech Language Pathology Daily Session Note  Patient Details  Name: [name] MRN: [id number] Date of Birth: [birth year]  Today's Date: 10/01/2014 SLP Co-Treatment Time: 1035-1105 (Co-tx with PT, 1005-1105 treatment time) SLP Co-Treatment Time Calculation (min): 30 min  Short Term Goals: Week 1: SLP Short Term Goal 1 (Week 1): STG=LTG due to ELOS   Skilled Therapeutic Interventions:  Pt was seen for skilled ST/PT co-treatment targeting cognitive goals while engaged in functional ambulation and high level balance tasks.  See PT note for specific goals and interventions.  SLP facilitated the session with a basic scavenger hunt activity targeting visual scanning to the right of midline, selective attention, and safety awareness. Pt located targeted items from both the left and right sides of the environment with overall mod assist multimodal cues.  Pt selectively attended to task in a highly distracting environment with overall min assist multimodal cues.  He was able to follow 1-step commands in context with overall mod assist multimodal cues.  Pt's wife was encouraged to sit on pt's right side and to use short, simple sentences when communicating with pt.  Pt was left upright in wheelchair with wife at bedside and all needs within reach.  Continue per current plan of care.    FIM:  Comprehension Comprehension Mode: Auditory Comprehension: 2-Understands basic 25 - 49% of the time/requires cueing 51 - 75% of the time Expression Expression Mode: Verbal Expression: 2-Expresses basic 25 - 49% of the time/requires cueing 50 - 75% of the time. Uses single words/gestures. Social Interaction Social Interaction: 3-Interacts appropriately 50 - 74% of the time - May be physically or verbally inappropriate. Problem Solving Problem Solving: 2-Solves basic 25 - 49% of the time - needs direction more than half the time to initiate, plan or complete simple activities Memory Memory: 2-Recognizes or  recalls 25 - 49% of the time/requires cueing 51 - 75% of the time FIM - Eating Eating Activity: 6: More than reasonable amount of time;5: Supervision/cues  Pain Pain Assessment Pain Assessment: No/denies pain  Therapy/Group: Individual Therapy  [name], [name] 10/01/2014, 12:41 PM

## 2014-10-02 ENCOUNTER — Inpatient Hospital Stay (HOSPITAL_COMMUNITY): Payer: BLUE CROSS/BLUE SHIELD | Admitting: Occupational Therapy

## 2014-10-02 ENCOUNTER — Inpatient Hospital Stay (HOSPITAL_COMMUNITY): Payer: BLUE CROSS/BLUE SHIELD | Admitting: Speech Pathology

## 2014-10-02 ENCOUNTER — Inpatient Hospital Stay (HOSPITAL_COMMUNITY): Payer: BLUE CROSS/BLUE SHIELD | Admitting: Physical Therapy

## 2014-10-02 MED ORDER — TRAZODONE HCL 50 MG PO TABS
25.0000 mg | ORAL_TABLET | Freq: Every evening | ORAL | Status: DC | PRN
  Administered 2014-10-02: 25 mg via ORAL
  Filled 2014-10-02: qty 1

## 2014-10-02 NOTE — Plan of Care (Signed)
Problem: RH PAIN MANAGEMENT Goal: RH STG PAIN MANAGED AT OR BELOW PT'S PAIN GOAL Pain less than 3  Outcome: Not Progressing Requires ultram prn headache 4-6 level

## 2014-10-02 NOTE — Progress Notes (Signed)
Physical Therapy Session Note  Patient Details  Name: [name] MRN: [id number] Date of Birth: 03/10/1955  Today's Date: 10/02/2014 PT Individual Time: 1000-1100 PT Individual Time Calculation (min): 60 min   Short Term Goals: Week 1:  PT Short Term Goal 1 (Week 1): STGs=LTGs due to ELOS  Skilled Therapeutic Interventions/Progress Updates:    Pt received in recliner with wife present and agreeable to therapy. PT instructs patient in activity to attend to R side, switching gaze from objects in L field of vision to objects in R field of vision. Pt ambulated >300' in community environment, down to gift shop, [place], and outside over uneven surfaces with wife holding R hand to direct attention to R side. Pt with episodes of R alien arm syndrome and episodes of noticing his wife holding his hand. Pt performed car transfer with supervision for safe execution and stairs 4 steps x5 reps with BHR and supervision focusing on turning to the R at the top and the bottom of the staircase.  PT re-administered manual TUG with improvement in score to 12.3 seconds, noting improved time 2/2 improved ability to understand and recall directions and stay attended to task.  Pt continues to demonstrate R neglect, though less severe than on admission.  Pt continues to require supervision for all mobility 2/2 neglect and impulsivity.   Therapy Documentation Precautions:  Precautions Precautions: Fall Precaution Comments: pt with both expressive and receptive difficulties, expressive worse than receptive Restrictions Weight Bearing Restrictions: No  Pain: Pain Assessment Pain Assessment: Faces Pain Score: 2  Faces Pain Scale: Hurts a little bit Pain Location: Head Pain Orientation: Left Pain Descriptors / Indicators: Aching Patients Stated Pain Goal: 2 Pain Intervention(s): Medication (See eMAR)  Locomotion : Ambulation Ambulation/Gait Assistance: 5: Supervision   See FIM for current functional  status  Therapy/Group: Individual Therapy  Earnest Conroy Penven-Crew 10/02/2014, 12:29 PM

## 2014-10-02 NOTE — Progress Notes (Signed)
Speech Language Pathology Daily Session Note  Patient Details  Name: [name] MRN: [id number] Date of Birth: 16-Jan-1956  Today's Date: 10/02/2014 SLP Individual Time: 4627-0350 SLP Individual Time Calculation (min): 30 min  Short Term Goals: Week 1: SLP Short Term Goal 1 (Week 1): STG=LTG due to ELOS   Skilled Therapeutic Interventions:  Pt was seen for skilled ST targeting cognitive goals.  Upon arrival, pt was seated upright in recliner, awake, alert, and agreeable to participate in ST.  SLP facilitated the session with a basic kitchen task targeting visual scanning to the right of midline, safety awareness, and functional problem solving.  Pt organized items on the kitchen shelves by category with max faded to min assist multimodal cues.  He required max assist multimodal cues for awareness of safety hazards, such as open kitchen cabinets, on the right.  SLP also facilitated the session with a basic familiar board game to continue addressing visual scanning to the right of midline and functional problem solving.  Pt required mod assist demonstration cues to set up playing board and max assist multimodal cues to plan and execute a problem solving strategy.  Pt was returned to room, left in recliner with wife and son at bedside.  Continue per current plan of care.    FIM:  Comprehension Comprehension Mode: Auditory Comprehension: 2-Understands basic 25 - 49% of the time/requires cueing 51 - 75% of the time Expression Expression Mode: Verbal Expression: 2-Expresses basic 25 - 49% of the time/requires cueing 50 - 75% of the time. Uses single words/gestures. Social Interaction Social Interaction: 3-Interacts appropriately 50 - 74% of the time - May be physically or verbally inappropriate. Problem Solving Problem Solving: 2-Solves basic 25 - 49% of the time - needs direction more than half the time to initiate, plan or complete simple activities Memory Memory: 2-Recognizes or recalls  25 - 49% of the time/requires cueing 51 - 75% of the time  Pain Pain Assessment Pain Assessment: Faces Faces Pain Scale: No hurt  Therapy/Group: Individual Therapy  Jacelyn Cuen, Selinda Orion 10/02/2014, 4:27 PM

## 2014-10-02 NOTE — Progress Notes (Signed)
Speech Language Pathology Daily Session Note  Patient Details  Name: [name] MRN: [id number] Date of Birth: Jul 06, 1955  Today's Date: 10/02/2014 SLP Individual Time: 0903-1000 SLP Individual Time Calculation (min): 57 min  Short Term Goals: Week 1: SLP Short Term Goal 1 (Week 1): STG=LTG due to ELOS   Skilled Therapeutic Interventions:  Pt was seen for skilled ST targeting communication goals.  Upon arrival, pt was seated upright in recliner, sleeping, but easily awakened to voice, and eager to participate in [place].  Pt ambulated to ST treatment room with mod-max assist multimodal cues for route recall and awareness of obstacles on the right.  SLP facilitated the session with structured writing practice to target ongoing diagnostic treatment of pt's most intact modality for communication.  Pt was able to write his name following initial hand over hand assist to motor plan finger grip on marker and to place the point of the marker to a dry erase board.  Pt also benefited from multiple visual models to motor plan and sequence letter formation.  Pt's written expression was characterized by intermittent spelling errors, of which pt was aware but unable to correct.   Of note, pt was noted to verbalize aloud while writing and, while the letters he was verbalizing were incorrect, the letters he was writing were ~90% accurate to spell his name (i.e. He left out one letter on two consecutive trials, different letters were omitted at each attempt).  SLP also facilitated the session with a basic sequencing task targeting sustained attention, visual scanning to the right of midline, and self regulation of tangential verbal output.  Pt required max faded to supervision cues to arrange picture cards in sequential orders; cuing faded and accuracy was improved with increased trial repetition and limited verbal distractions.  Pt also benefited from max faded to min assist verbal cues for awareness of  tangentiality.  Pt was left in room with wife present at bedside, all needs left within reach.  Continue per current plan of care.      FIM:  Comprehension Comprehension Mode: Auditory Comprehension: 2-Understands basic 25 - 49% of the time/requires cueing 51 - 75% of the time Expression Expression Mode: Verbal Expression: 2-Expresses basic 25 - 49% of the time/requires cueing 50 - 75% of the time. Uses single words/gestures. Social Interaction Social Interaction: 3-Interacts appropriately 50 - 74% of the time - May be physically or verbally inappropriate. Problem Solving Problem Solving: 2-Solves basic 25 - 49% of the time - needs direction more than half the time to initiate, plan or complete simple activities Memory Memory: 2-Recognizes or recalls 25 - 49% of the time/requires cueing 51 - 75% of the time FIM - Eating Eating Activity: 6: More than reasonable amount of time  Pain Pain Assessment Pain Assessment: Faces Faces Pain Scale: No hurt   Therapy/Group: Individual Therapy  Ivery Nanney, Selinda Orion 10/02/2014, 12:27 PM

## 2014-10-02 NOTE — Progress Notes (Signed)
[place] PHYSICAL MEDICINE & REHABILITATION     PROGRESS NOTE    Subjective/Complaints: Up early with therapy. No problems reported overnight.   Otherwise no major complaints--ROS very limited by his aphasia  Objective: Vital Signs: Blood pressure 118/69, pulse 60, temperature 97.6 F (36.4 C), temperature source Oral, resp. rate 18, height 5\' 10"  (1.778 m), weight 89.3 kg (196 lb 13.9 oz), SpO2 96 %. No results found.  Recent Labs  09/30/14 1305  WBC 9.0  HGB 14.5  HCT 41.9  PLT 228    Recent Labs  09/30/14 1305  NA 137  K 4.1  CL 102  GLUCOSE 108*  BUN 13  CREATININE 0.85  CALCIUM 9.3   CBG (last 3)  No results for input(s): GLUCAP in the last 72 hours.  Wt Readings from Last 3 Encounters:  09/30/14 89.3 kg (196 lb 13.9 oz)  09/26/14 98.8 kg (217 lb 13 oz)  05/28/14 93.441 kg (206 lb)    Physical Exam:  Constitutional: He appears well-developed.  HENT: dentition fair to good, oral mucosa pink and moist Head: Normocephalic.  Eyes: EOM are normal.  Neck: Normal range of motion. Neck supple. No thyromegaly present.  Cardiovascular:  Cardiac rate controlled without murmur Respiratory: Effort normal and breath sounds normal. No respiratory distress. No wheezes or rales GI: Soft. Bowel sounds are normal. He exhibits no distension.  Neurological: He is alert. Dense right HH Patient with expressive > receptive aphasia with apraxia---a little more fluid at times now.  MMT: Right--- deltoid, biceps, triceps, wrist, HI, HF, KE, ADF, APF 4 to 4+/5 with decreased [place]. LUE and LLE 5/5 prox to distal. Sensory intact to paint and gross touch in all 4. dtr's 2+. Still perseverates on certain tasks and phrases----  Skin: Skin is warm and dry. Sutures removed from for head lesion excision site clean and dry  Psychiatric: He has a normal mood and affect. His behavior is normal   Assessment/Plan: 1. Functional deficits secondary to embolic left MCA infarct which  require 3+ hours per day of interdisciplinary therapy in a comprehensive inpatient rehab setting. Physiatrist is providing close team supervision and 24 hour management of active medical problems listed below. Physiatrist and rehab team continue to assess barriers to discharge/monitor patient progress toward functional and medical goals. FIM: FIM - Bathing Bathing Steps Patient Completed: Chest, Right Arm, Left Arm, Abdomen, Front perineal area, Right upper leg, Left upper leg, Left lower leg (including foot), Right lower leg (including foot), Buttocks Bathing: 4: Steadying assist  FIM - Upper Body Dressing/Undressing Upper body dressing/undressing steps patient completed: Thread/unthread right sleeve of pullover shirt/dresss, Thread/unthread left sleeve of pullover shirt/dress, Put head through opening of pull over shirt/dress, Pull shirt over trunk Upper body dressing/undressing: 5: Supervision: Safety issues/verbal cues FIM - Lower Body Dressing/Undressing Lower body dressing/undressing steps patient completed: Thread/unthread right underwear leg, Thread/unthread left underwear leg, Pull underwear up/down, Thread/unthread right pants leg, Thread/unthread left pants leg, Pull pants up/down, Don/Doff right shoe, Don/Doff left shoe, Fasten/unfasten right shoe, Fasten/unfasten left shoe Lower body dressing/undressing: 4: Steadying Assist  FIM - Toileting Toileting steps completed by patient: Adjust clothing prior to toileting, Performs perineal hygiene, Adjust clothing after toileting Toileting Assistive Devices: Grab bar or rail for support Toileting: 4: Steadying assist  FIM - Radio producer Devices:  (Pt standing with steady assist to urinate)  FIM - Control and instrumentation engineer Devices: Arm rests, Bed rails Bed/Chair Transfer: 5: Supine > Sit: Supervision (verbal cues/safety  issues), 4: Sit > Supine: Min A (steadying pt. > 75%/lift 1  leg)  FIM - Locomotion: Wheelchair Locomotion: Wheelchair: 0: Activity did not occur FIM - Locomotion: Ambulation Locomotion: Ambulation Assistive Devices:  (none) Ambulation/Gait Assistance: 4: Min guard Locomotion: Ambulation: 4: Travels 150 ft or more with minimal assistance (Pt.>75%)  Comprehension Comprehension Mode: Auditory Comprehension: 2-Understands basic 25 - 49% of the time/requires cueing 51 - 75% of the time  Expression Expression Mode: Verbal Expression: 2-Expresses basic 25 - 49% of the time/requires cueing 50 - 75% of the time. Uses single words/gestures.  Social Interaction Social Interaction: 3-Interacts appropriately 50 - 74% of the time - May be physically or verbally inappropriate.  Problem Solving Problem Solving: 2-Solves basic 25 - 49% of the time - needs direction more than half the time to initiate, plan or complete simple activities  Memory Memory: 2-Recognizes or recalls 25 - 49% of the time/requires cueing 51 - 75% of the time  Medical Problem List and Plan: 1. Functional deficits secondary to embolic left MCA infarct (?ICA vs cardiac etiology) with expressive receptive aphasia after right ear tympanoplasty 09/24/2014  -remains impaired quite a bit by his right HH and lack of ability to compensate for it in space. 2. DVT Prophylaxis/Anticoagulation: Eliquis required due to afib. No current bleeding issues 3. Pain Management: Tylenol as needed. No pain at present 4. Hypertension/atrial fibrillation. Lopressor 12.5 mg twice a day. HR controlled, regular rhythm 5. Neuropsych: This patient is not capable of making decisions on his own behalf. 6. Skin/Wound Care: oob, nutrition 7. Fluids/Electrolytes/Nutrition: eating fairly well.  8. Hyperlipidemia. Lipitor for secondary stroke prevention 9. Right ICA stenosis. To be followed-up outpatient with vascular surgery. Left ICA occluded  LOS (Days) 3 A FACE TO FACE EVALUATION WAS PERFORMED  Jacilyn Sanpedro  T 10/02/2014 8:26 AM

## 2014-10-02 NOTE — Progress Notes (Signed)
Occupational Therapy Session Note  Patient Details  Name: [name] MRN: [id number] Date of Birth: [birth year]  Today's Date: 10/02/2014 OT Individual Time: 9381-0175 OT Individual Time Calculation (min): 58 min    Short Term Goals: Week 1:  OT Short Term Goal 1 (Week 1): STGs=LTGs based on estimated short LOS  Skilled Therapeutic Interventions/Progress Updates:  Upon entering the room, pt supine in bed awaiting therapist. Pt requiring min verbal cues for impulsivity as pt sees therapist and immediately attempting to get out of bed. Pt requesting to shower this session. Pt sitting on EOB and reaching into dresser to obtain clothing items with min verbal guidance cues. Pt ambulating to shower without use of AD and steady assist. Pt showering with mod verbal cues to apply body wash and to wash all body parts this session. Pt seated on EOB to dress self with steady assist. Pt able to self correct dressing mistakes with increased time and no verbal cues. Pt ambulating to sink side for grooming with min A secondary to needing assistance with shaving for safety. Pt ambulating in hallwall and still unable to stop self from running into objects on R side. Pt constantly talking and distracting self and unable to avoid obstacles on R side without therapist stopping pt fully. Pt ambulating 150' with steady assist for safety. Pt seated in recliner chair with min demonstrational cues for opening containers and eating food. Quick release belt places on pt with call bell and all needed items within reach.   Therapy Documentation Precautions:  Precautions Precautions: Fall Precaution Comments: pt with both expressive and receptive difficulties, expressive worse than receptive Restrictions Weight Bearing Restrictions: No General:   Vital Signs: Therapy Vitals Temp: 97.6 F (36.4 C) Temp Source: Oral Pulse Rate: 60 Resp: 18 BP: 118/69 mmHg Patient Position (if appropriate): Lying Oxygen  Therapy SpO2: 96 % O2 Device: Not Delivered  See FIM for current functional status  Therapy/Group: Individual Therapy  Phineas Semen 10/02/2014, 8:15 AM

## 2014-10-03 ENCOUNTER — Inpatient Hospital Stay (HOSPITAL_COMMUNITY): Payer: BLUE CROSS/BLUE SHIELD | Admitting: Occupational Therapy

## 2014-10-03 ENCOUNTER — Inpatient Hospital Stay (HOSPITAL_COMMUNITY): Payer: BLUE CROSS/BLUE SHIELD | Admitting: Speech Pathology

## 2014-10-03 ENCOUNTER — Inpatient Hospital Stay (HOSPITAL_COMMUNITY): Payer: BLUE CROSS/BLUE SHIELD | Admitting: *Deleted

## 2014-10-03 NOTE — Progress Notes (Signed)
Speech Language Pathology Daily Session Note  Patient Details  Name: [name] MRN: [id number] Date of Birth: 09-Mar-1955  Today's Date: 10/03/2014 SLP Individual Time: 1400-1500 SLP Individual Time Calculation (min): 60 min  Short Term Goals: Week 1: SLP Short Term Goal 1 (Week 1): STG=LTG due to ELOS   Skilled Therapeutic Interventions: Skilled treatment session focused on cognitive-linguistic goals.  SLP facilitated session by utilizing a context-based approach that focused on communicating about topics that are of interest to the patient.  Patient participated in a functional conversation that focused on biographical information, hobbies/interests and current hospitalization.  Patient had "islands" of appropriate verbalizations and demonstrated decreased verbosity and increased ability to listen for appropriate turn taking with Mod A. Patient also utilized gestures and asked his family members for help when necessary.  Patient ambulated to and from his room and required Mod A verbal cues for attention to right field of environment.  Patient left upright in recliner with family present. Continue with current plan of care.    FIM:  Comprehension Comprehension Mode: Auditory Comprehension: 2-Understands basic 25 - 49% of the time/requires cueing 51 - 75% of the time Expression Expression Mode: Verbal Expression: 2-Expresses basic 25 - 49% of the time/requires cueing 50 - 75% of the time. Uses single words/gestures. Social Interaction Social Interaction: 3-Interacts appropriately 50 - 74% of the time - May be physically or verbally inappropriate. Problem Solving Problem Solving: 2-Solves basic 25 - 49% of the time - needs direction more than half the time to initiate, plan or complete simple activities Memory Memory: 2-Recognizes or recalls 25 - 49% of the time/requires cueing 51 - 75% of the time FIM - Eating Eating Activity: 5: Needs verbal cues/supervision  Pain Pain  Assessment Pain Assessment: No/denies pain  Therapy/Group: Individual Therapy  [name] 10/03/2014, 3:11 PM

## 2014-10-03 NOTE — Progress Notes (Signed)
Physical Therapy Session Note  Patient Details  Name: [name] MRN: [id number] Date of Birth: 1955/03/05  Today's Date: 10/03/2014 PT Individual Time: 0755-0857 PT Individual Time Calculation (min): 62 min     Skilled Therapeutic Interventions/Progress Updates:  Patient in room at the beginning of session, sitting in recliner, agrees to therapy intervention,no c/o pain or discomfort.  Session initiated with gait training to the gym with supervision on R side and occasional holding of R hand due to involuntary shoulder  Flexion and swing forward.  NuStep x 10 min with resistance level at 6 to facilitate reciprocal movement of all 4 limbs and increase in coordination and fluidity of movement.  Manual assistance to grab handles and place feet in position , patient presents with difficulty of R side positioning.  Training in target use of r UE in standing and crossing mid line-placing horseshoes on basketball ring. Forced use of R UE facilitated, activity repeated when standing on foam board -min to mod A for maintaining balance, increased tactile cues for use of R UE.  Attempted to performed target use of R LE by placing it on numbers on floor, patient not able to follow instruction despite verbal tactile and manual assistance.  Gait Training on prolonged distance with PT holding R hand and cueing to pay attention to R especially during turns. Patient very vocal , difficult to redirect.  In room patient performed reaching for objects in sitting with R UE, 75% accuracy but unable to shift attention to a sudden change in task.  Patient left in room with all needs within reach and QT belt on.   Therapy Documentation Precautions:  Precautions Precautions: Fall Precaution Comments: pt with both expressive and receptive difficulties, expressive worse than receptive Restrictions Weight Bearing Restrictions: No Vital Signs: Therapy Vitals Temp: 97.8 F (36.6 C) Temp Source: Oral Pulse  Rate: 75 Resp: 18 BP: 110/80 mmHg Patient Position (if appropriate): Sitting Oxygen Therapy SpO2: 98 % O2 Device: Nasal Cannula   See FIM for current functional status  Therapy/Group: Individual Therapy  [name] 10/03/2014, 8:58 AM

## 2014-10-03 NOTE — Progress Notes (Signed)
  [place] PHYSICAL MEDICINE & REHABILITATION     PROGRESS NOTE    Subjective/Complaints: Patient denies pain and denies complaints.  It's difficult to get a full history due to his pressured speech.  Objective: Vital Signs: Blood pressure 110/80, pulse 75, temperature 97.8 F (36.6 C), temperature source Oral, resp. rate 18, height 5\' 10"  (1.778 m), weight 196 lb 13.9 oz (89.3 kg), SpO2 98 %.  No acute distress.He was seen walking down the hallway with therapy. He clearly leans to the right. Cardiac exam S1 and S2 are regular.Chest is clear to auscultation Abdominal exam with bowel sounds. Soft. Extremities no edema.  Assessment/Plan: 1. Functional deficits secondary to embolic left MCA infarct  Medical Problem List and Plan: 1. Functional deficits secondary to embolic left MCA infarct (?ICA vs cardiac etiology) with expressive receptive aphasia after right ear tympanoplasty 09/24/2014  - 2. DVT Prophylaxis/Anticoagulation: Eliquis required due to afib. No current bleeding issues 3. Pain Management: Tylenol as needed. No pain at present 4. Hypertension/atrial fibrillation. Lopressor 12.5 mg twice a day. HR controlled, regular rhythm 5. Neuropsych: This patient is not capable of making decisions on his own behalf. 6. Skin/Wound Care: oob, nutrition 7. Fluids/Electrolytes/Nutrition: eating well.  8. Hyperlipidemia. Lipitor for secondary stroke prevention 9. Right ICA stenosis. To be followed-up outpatient with vascular surgery. Left ICA occluded  LOS (Days) 4 A FACE TO FACE EVALUATION WAS PERFORMED  [facility] HENRY 10/03/2014 8:49 AM

## 2014-10-03 NOTE — Progress Notes (Signed)
Occupational Therapy Session Note  Patient Details  Name: [name] MRN: [id number] Date of Birth: [birth year]  Today's Date: 10/03/2014 OT Individual Time: 0700-0755 and 1000-1030 OT Individual Time Calculation (min): 55 min and 30 min    Short Term Goals: Week 1:  OT Short Term Goal 1 (Week 1): STGs=LTGs based on estimated short LOS  Skilled Therapeutic Interventions/Progress Updates:  Session 1: Upon entering the room, pt supine in bed with no c/o pain this morning. Pt requesting to shower. Supine >sit on edge of bed with supervision. Pt obtaining all clothing items from dresser with min verbal guidance cues. Pt ambulating into bathroom with close supervision and transferring into shower to sit on TTB with steady assist. Pt bathing at shower level with mod verbal cues to wash all part and to use soap. Pt returning to bed for dressing while seated on bed. Pt required min verbal cues for safety as pt attempting to stand while donning pants and underwear. Pt deep squatting in bathroom to pick up dirty clothes and towel from floor with min guard for safety. Pt able to brush teeth and apply deodorant without instructional or demonstrational cues this session. Pt seated in recliner chair with quick release belt donned and eating breakfast. Pt required min verbal cues to correctly open some containers and to determine what utensil to use. Call bell and all needed items within reach upon exiting the room.    Session 2: Upon entering the room, pt seated in recliner chair with wife present in room to observe session. OT educated pt's wife on OT purpose and progress towards goals. Pt ambulated with close supervision - min guard for safety as pt continues to walk into obstacles on R side. Pt ambulated into ADL apartment to make bed with min guard for balance. Pt requiring min verbal cues for safety during task.In kitchen, pt unable to word find to name items but able to describe use of each item. Pt  ambulating 150' to day room to water plants. Pt requiring min verbal cues for sequencing but pt checking each plant without cues as this is routine task for him at home. Pt requiring cues for initial direction towards room but able to find room correctly without further instruction. Pt seated in recliner chair with wife remaining present in room.   Therapy Documentation Precautions:  Precautions Precautions: Fall Precaution Comments: pt with both expressive and receptive difficulties, expressive worse than receptive Restrictions Weight Bearing Restrictions: No Vital Signs: Therapy Vitals Temp: 97.8 F (36.6 C) Temp Source: Oral Pulse Rate: 75 Resp: 18 BP: 110/80 mmHg Patient Position (if appropriate): Sitting Oxygen Therapy SpO2: 98 % O2 Device: Nasal Cannula  See FIM for current functional status  Therapy/Group: Individual Therapy  Phineas Semen 10/03/2014, 7:56 AM

## 2014-10-04 ENCOUNTER — Inpatient Hospital Stay (HOSPITAL_COMMUNITY): Payer: BLUE CROSS/BLUE SHIELD | Admitting: Occupational Therapy

## 2014-10-04 MED ORDER — LINACLOTIDE 145 MCG PO CAPS
290.0000 ug | ORAL_CAPSULE | Freq: Every day | ORAL | Status: DC
  Administered 2014-10-04 – 2014-10-05 (×2): 290 ug via ORAL
  Filled 2014-10-04 (×2): qty 2

## 2014-10-04 NOTE — Progress Notes (Signed)
Occupational Therapy Session Note  Patient Details  Name: [name] MRN: [id number] Date of Birth: 01-Oct-1955  Today's Date: 10/04/2014 OT Individual Time: [phone] OT Individual Time Calculation (min): 45 min    Short Term Goals: Week 1:  OT Short Term Goal 1 (Week 1): STGs=LTGs based on estimated short LOS  Skilled Therapeutic Interventions/Progress Updates:    Engaged in ADL retraining with focus on functional mobility, sequencing, following single step commands, and visual attention to Rt.  Completed bathing tasks at sit > stand level in room shower with max cues for sequencing and to incorporate use of soap into bathing.  Pt perseverating on washing shoulders and stating "I don't know what you want me to do".  Required tactile cues for sequencing with approx 50% accuracy.   Dressing completed at sit > stand level with noted improvements in sequencing without need for cueing.  Grooming completed in standing again without need for cues.  Steady assist with all mobility this session.  Engaged in visual scanning task seated with pt to scan to locate numbers 1-10 on piece of paper with pt stating alphabet letters instead of numbers, requiring demonstration cue with pt able to count and locate to 4, but then returning to perseverating on letters.  Attempted to have pt write his name with pt initially starting to list alphabet again even after given demonstration to copy, then able to spell correctly aloud his last name but misspelling it when writing.  Pt returned to recliner and left seated with quick release belt and all needs in reach.  Therapy Documentation Precautions:  Precautions Precautions: Fall Precaution Comments: pt with both expressive and receptive difficulties, expressive worse than receptive Restrictions Weight Bearing Restrictions: No General:   Vital Signs: Therapy Vitals Temp: 98.1 F (36.7 C) Temp Source: Oral Pulse Rate: 69 Resp: 18 BP: 107/69 mmHg Patient  Position (if appropriate): Lying Oxygen Therapy SpO2: 97 % O2 Device: Not Delivered Pain:  Pt with no c/o pain  See FIM for current functional status  Therapy/Group: Individual Therapy  Simonne Come 10/04/2014, 9:24 AM

## 2014-10-04 NOTE — Progress Notes (Signed)
  [place] PHYSICAL MEDICINE & REHABILITATION     PROGRESS NOTE    Subjective/Complaints: Patient denies pain. C/o constipation  Objective: Vital Signs: Blood pressure 107/69, pulse 69, temperature 98.1 F (36.7 C), temperature source Oral, resp. rate 18, height 5\' 10"  (1.778 m), weight 196 lb 13.9 oz (89.3 kg), SpO2 97 %.  No acute distress. He leans to the right. HEENT moist  Cardiac exam S1 and S2 are regular.  Chest is clear to auscultation Abdominal exam with bowel sounds. Soft. Extremities no edema.    Medical Problem List and Plan:  1. Functional deficits secondary to embolic left MCA infarct (?ICA vs cardiac etiology) with expressive receptive aphasia after right ear tympanoplasty 09/24/2014  - 2. DVT Prophylaxis/Anticoagulation: Eliquis required due to afib. No current bleeding issues 3. Pain Management: Tylenol as needed. No pain at present 4. Hypertension/atrial fibrillation. Lopressor 12.5 mg twice a day. HR controlled, regular rhythm 5. Neuropsych: This patient is not capable of making decisions on his own behalf. 6. Skin/Wound Care: oob, nutrition 7. Fluids/Electrolytes/Nutrition: eating well.  8. Hyperlipidemia. Lipitor for secondary stroke prevention 9. Right ICA stenosis. To be followed-up outpatient with vascular surgery. Left ICA occluded 10. Constipation. Start Linzess  LOS (Days) 5 A FACE TO FACE EVALUATION WAS PERFORMED  [name] 10/04/2014 2:14 PM

## 2014-10-04 NOTE — Plan of Care (Signed)
Problem: RH BOWEL ELIMINATION Goal: RH STG MANAGE BOWEL WITH ASSISTANCE STG Manage Bowel with Assistance.  Outcome: Not Progressing Patient given meds, laxatives for constipation

## 2014-10-05 ENCOUNTER — Inpatient Hospital Stay (HOSPITAL_COMMUNITY): Payer: BLUE CROSS/BLUE SHIELD

## 2014-10-05 ENCOUNTER — Encounter (HOSPITAL_COMMUNITY): Payer: BLUE CROSS/BLUE SHIELD

## 2014-10-05 ENCOUNTER — Inpatient Hospital Stay (HOSPITAL_COMMUNITY): Payer: BLUE CROSS/BLUE SHIELD | Admitting: Speech Pathology

## 2014-10-05 ENCOUNTER — Inpatient Hospital Stay (HOSPITAL_COMMUNITY): Payer: BLUE CROSS/BLUE SHIELD | Admitting: Physical Therapy

## 2014-10-05 ENCOUNTER — Inpatient Hospital Stay (HOSPITAL_COMMUNITY): Payer: BLUE CROSS/BLUE SHIELD | Admitting: Occupational Therapy

## 2014-10-05 LAB — CBC
HCT: 46.7 % (ref 39.0–52.0)
Hemoglobin: 15.9 g/dL (ref 13.0–17.0)
MCH: 30.9 pg (ref 26.0–34.0)
MCHC: 34 g/dL (ref 30.0–36.0)
MCV: 90.7 fL (ref 78.0–100.0)
Platelets: 289 10*3/uL (ref 150–400)
RBC: 5.15 MIL/uL (ref 4.22–5.81)
RDW: 12.4 % (ref 11.5–15.5)
WBC: 8.7 10*3/uL (ref 4.0–10.5)

## 2014-10-05 LAB — BASIC METABOLIC PANEL
Anion gap: 9 (ref 5–15)
BUN: 16 mg/dL (ref 6–20)
CALCIUM: 10 mg/dL (ref 8.9–10.3)
CO2: 28 mmol/L (ref 22–32)
Chloride: 101 mmol/L (ref 101–111)
Creatinine, Ser: 1.22 mg/dL (ref 0.61–1.24)
GFR calc Af Amer: >60 mL/min (ref >60)
GFR calc non Af Amer: >60 mL/min (ref >60)
GLUCOSE: 132 mg/dL — AB (ref 65–99)
Potassium: 4.4 mmol/L (ref 3.5–5.1)
Sodium: 138 mmol/L (ref 135–145)

## 2014-10-05 LAB — CK TOTAL AND CKMB (NOT AT ARMC)
CK, MB: 5 ng/mL (ref 0.5–5.0)
Relative Index: 0.9 (ref 0.0–2.5)
Total CK: 578 U/L — ABNORMAL HIGH (ref 49–397)

## 2014-10-05 LAB — GLUCOSE, CAPILLARY
Glucose-Capillary: 115 mg/dL — ABNORMAL HIGH (ref 65–99)
Glucose-Capillary: 129 mg/dL — ABNORMAL HIGH (ref 65–99)

## 2014-10-05 MED ORDER — SENNOSIDES-DOCUSATE SODIUM 8.6-50 MG PO TABS
2.0000 | ORAL_TABLET | Freq: Two times a day (BID) | ORAL | Status: DC
  Administered 2014-10-05 – 2014-10-09 (×9): 2 via ORAL
  Filled 2014-10-05 (×9): qty 2

## 2014-10-05 NOTE — Progress Notes (Signed)
Occupational Therapy Session Note  Patient Details  Name: [name] MRN: [id number] Date of Birth: Dec 13, 1955  Today's Date: 10/05/2014 OT Individual Time: 0830-0930 OT Individual Time Calculation (min): 60 min    Short Term Goals: Week 1:  OT Short Term Goal 1 (Week 1): STGs=LTGs based on estimated short LOS  Skilled Therapeutic Interventions/Progress Updates: ADL-retraining with focus on improved attention to right, safety awareness, endurance, functional mobility, and functional communication during BADL.   Pt. received seated in recliner with QRB attached.   With mod vc, pt accepts direction to bathe at shower level.  After setup to provide supplies and clothing, pt ambulates to shower with mod vc and bathes sitting/standing with intermittent assist to direct attention to his right.   Pt proceeds through shower and dries standing in shower stall and ambulates to edge of bed to dress with setup to provide clothing and vc to sequence and problem-solve while donning clothing at right lower extremity.    Pt completes dressing and grooms standing at sink.    Therapeutic activity with focus on improved orientation, awareness and attention to right, and verbal expression during functional mobility.   OT escorts pt from his room to outdoor area and back to room.   Pt requires one rest break but engages in continuous expression with mod max assist to name items, location, and recall family member names.   Pt is escorted back to his room and rests in recliner with QRB attached for safety and all needs within reach.     Therapy Documentation Precautions:  Precautions Precautions: Fall Precaution Comments: pt with both expressive and receptive difficulties, expressive worse than receptive Restrictions Weight Bearing Restrictions: No Vital Signs: Therapy Vitals Temp: 97.9 F (36.6 C) Temp Source: Oral Pulse Rate: 66 Resp: 18 BP: 118/67 mmHg Patient Position (if appropriate):  Lying Oxygen Therapy SpO2: 96 % O2 Device: Not Delivered Pain: Pain Assessment Pain Assessment: No/denies pain  See FIM for current functional status  Therapy/Group: Individual Therapy  Otisha Spickler 10/05/2014, 9:29 AM

## 2014-10-05 NOTE — Progress Notes (Signed)
[facility] PHYSICAL MEDICINE & REHABILITATION     PROGRESS NOTE    Subjective/Complaints: Points to abd- pain "not for 4 days", pt indicates no BM No vomiting noted Otherwise no major complaints--ROS very limited by his aphasia  Objective: Vital Signs: Blood pressure 102/66, pulse 62, temperature 97.6 F (36.4 C), temperature source Oral, resp. rate 20, height 5\' 10"  (1.778 m), weight 89.3 kg (196 lb 13.9 oz), SpO2 97 %. No results found. No results for input(s): WBC, HGB, HCT, PLT in the last 72 hours. No results for input(s): NA, K, CL, GLUCOSE, BUN, CREATININE, CALCIUM in the last 72 hours.  Invalid input(s): CO CBG (last 3)  No results for input(s): GLUCAP in the last 72 hours.  Wt Readings from Last 3 Encounters:  09/30/14 89.3 kg (196 lb 13.9 oz)  09/26/14 98.8 kg (217 lb 13 oz)  05/28/14 93.441 kg (206 lb)    Physical Exam:  Constitutional: He appears well-developed.  HENT: dentition fair to good, oral mucosa pink and moist Head: Normocephalic.  Eyes: EOM are normal.  Neck: Normal range of motion. Neck supple. No thyromegaly present.  Cardiovascular:  Cardiac rate controlled without murmur Respiratory: Effort normal and breath sounds normal. No respiratory distress. No wheezes or rales GI: Soft. Bowel sounds are normal. He exhibits no distension.  Neurological: He is alert. Dense right HH Patient with expressive > receptive aphasia with apraxia---a little more fluid at times now.  MMT: Right--- deltoid, biceps, triceps, wrist, HI, HF, KE, ADF, APF 4 to 4+/5 with decreased [place]. LUE and LLE 5/5 prox to distal. Sensory intact to paint and gross touch in all 4. dtr's 2+. Still perseverates on certain  phrases---- Apraxic Skin: Skin is warm and dry. Sutures removed from for head lesion excision site clean and dry  Psychiatric: He has a normal mood and affect. His behavior is normal   Assessment/Plan: 1. Functional deficits secondary to embolic left MCA infarct  which require 3+ hours per day of interdisciplinary therapy in a comprehensive inpatient rehab setting. Physiatrist is providing close team supervision and 24 hour management of active medical problems listed below. Physiatrist and rehab team continue to assess barriers to discharge/monitor patient progress toward functional and medical goals. FIM: FIM - Bathing Bathing Steps Patient Completed: Chest, Right Arm, Left Arm, Abdomen, Front perineal area, Right upper leg, Left upper leg, Buttocks Bathing: 4: Min-Patient completes 8-9 65f 10 parts or 75+ percent  FIM - Upper Body Dressing/Undressing Upper body dressing/undressing steps patient completed: Thread/unthread right sleeve of pullover shirt/dresss, Thread/unthread left sleeve of pullover shirt/dress, Put head through opening of pull over shirt/dress, Pull shirt over trunk Upper body dressing/undressing: 5: Supervision: Safety issues/verbal cues FIM - Lower Body Dressing/Undressing Lower body dressing/undressing steps patient completed: Thread/unthread right underwear leg, Thread/unthread left underwear leg, Pull underwear up/down, Thread/unthread right pants leg, Thread/unthread left pants leg, Pull pants up/down, Don/Doff right shoe, Don/Doff left shoe, Fasten/unfasten right shoe, Fasten/unfasten left shoe, Don/Doff right sock, Don/Doff left sock Lower body dressing/undressing: 4: Steadying Assist  FIM - Toileting Toileting steps completed by patient: Adjust clothing prior to toileting, Performs perineal hygiene, Adjust clothing after toileting Toileting Assistive Devices: Grab bar or rail for support Toileting: 4: Steadying assist  FIM - Radio producer Devices:  (Pt standing with steady assist to urinate)  FIM - Control and instrumentation engineer Devices: Arm rests, Bed rails Bed/Chair Transfer: 4: Bed > Chair or W/C: Min A (steadying Pt. > 75%), 4: Chair or W/C >  Bed: Min A (steadying Pt. >  75%)  FIM - Locomotion: Wheelchair Locomotion: Wheelchair: 0: Activity did not occur FIM - Locomotion: Ambulation Locomotion: Ambulation Assistive Devices:  (none) Ambulation/Gait Assistance: 5: Supervision, 4: Min guard Locomotion: Ambulation: 5: Travels 150 ft or more with supervision/safety issues  Comprehension Comprehension Mode: Auditory Comprehension: 2-Understands basic 25 - 49% of the time/requires cueing 51 - 75% of the time  Expression Expression Mode: Verbal Expression: 2-Expresses basic 25 - 49% of the time/requires cueing 50 - 75% of the time. Uses single words/gestures.  Social Interaction Social Interaction: 3-Interacts appropriately 50 - 74% of the time - May be physically or verbally inappropriate.  Problem Solving Problem Solving: 2-Solves basic 25 - 49% of the time - needs direction more than half the time to initiate, plan or complete simple activities  Memory Memory: 2-Recognizes or recalls 25 - 49% of the time/requires cueing 51 - 75% of the time  Medical Problem List and Plan: 1. Functional deficits secondary to embolic left MCA infarct (?ICA vs cardiac etiology) with expressive receptive aphasia after right ear tympanoplasty 09/24/2014  -remains impaired quite a bit by his right HH and lack of ability to compensate for it in space. 2. DVT Prophylaxis/Anticoagulation: Eliquis required due to afib. No current bleeding issues 3. Pain Management: Tylenol as needed. No pain at present 4. Hypertension/atrial fibrillation. Lopressor 12.5 mg twice a day. HR controlled, regular rhythm, BP in an acceptable range 5. Neuropsych: This patient is not capable of making decisions on his own behalf. 6. Skin/Wound Care: oob, nutrition 7. Fluids/Electrolytes/Nutrition: eating ~50% meals.  8. Hyperlipidemia. Lipitor for secondary stroke prevention 9. Right ICA stenosis. To be followed-up outpatient with vascular surgery. Left ICA occluded 10.  Abd pain,exam unremarkable  constipation review laxatives LOS (Days) 6 A FACE TO FACE EVALUATION WAS PERFORMED  Charlett Blake 10/05/2014 6:33 AM

## 2014-10-05 NOTE — Progress Notes (Signed)
Speech Language Pathology Daily Session Note  Patient Details  Name: [name] MRN: [id number] Date of Birth: 1956/01/08  Today's Date: 10/05/2014 SLP Individual Time: 1100-1145 SLP Individual Time Calculation (min): 45 min  Short Term Goals: Week 1: SLP Short Term Goal 1 (Week 1): STG=LTG due to ELOS   Skilled Therapeutic Interventions:  Pt was seen for skilled ST targeting cognitive goals.  Upon arrival, pt was seated upright in the recliner, awake, alert, and eager to participate in therapies.  Pt recalled route to treatment room while ambulating with ST with min visual cues.  SLP facilitated the session with continued practice of functional problem solving and visual scanning to the right of midline during a previously targeted familiar board game.  Pt appeared to recall activity as well as task difficulty from previously targeted therapy session.  Pt set up the board with min faded to supervision cues for organization.  Pt required mod-max verbal and visual cues to execute a problem solving strategy and to attend to games pieces in the lower right quadrant of the board.  Pt appeared to recognize appropriate moves to make but had difficulty executing move which appeared to be related to attention/working memory and motor planning deficits.  Pt was also noted with improved self regulation of tangential verbal output and was able to engage in 1-2 appropriate conversational exchanges with SLP spontaneously.  Towards the end of today's session, pt indicated that he needed to use the restroom.  He bumped into a chair on his right when ambulating out of treatment room.  When redirecting pt away from obstacle, pt became noticeably diaphoretic and increasingly slow to respond.  Pt was seated in a chair where he began to drool and was unresponsive to sternal rub.   Pt's RN, P.A., and rapid response RN were notified. Pt handed off to medical team for work up.  As a result, pt missed 15 minutes of ST.     FIM:  Comprehension Comprehension Mode: Auditory Comprehension: 2-Understands basic 25 - 49% of the time/requires cueing 51 - 75% of the time Expression Expression Mode: Verbal Expression: 2-Expresses basic 25 - 49% of the time/requires cueing 50 - 75% of the time. Uses single words/gestures. Social Interaction Social Interaction: 3-Interacts appropriately 50 - 74% of the time - May be physically or verbally inappropriate. Problem Solving Problem Solving: 3-Solves basic 50 - 74% of the time/requires cueing 25 - 49% of the time Memory Memory: 2-Recognizes or recalls 25 - 49% of the time/requires cueing 51 - 75% of the time  Pain Pain Assessment Pain Assessment: No/denies pain Pain Score: 0-No pain  Therapy/Group: Individual Therapy  Clare Casto, Selinda Orion 10/05/2014, 3:55 PM

## 2014-10-05 NOTE — Significant Event (Signed)
0830: Patient up working with PT.  1110: Pt complained of a headache; Tylenol given. 1115: Patient ambulating with PT 1140: Call from PT stating patient had a change in mental status and diaphoretic. As I arrived to the speech therapy room, pt was sitting in a chair, diaphoretic, confused and minimally responsive. Pulse faint via palpitation. Pt had intermittent periods of when he was yawning and had garbled speech to pt drooling and being unresponsive to sternal rub, pulse still present. Vitals: 95/70 HR 53  MD present. Pt transferred to wheelchair. Cold compress placed on his neck, pt was startled and opened his eyes. Blood sugar 129. 1200: Pt becoming more alert and talking. Vitals: 112/72 HR 50. Pt taken back to his room and placed in bed. IV started, labs drawn, EKG taken. Pt's spouse present during event.

## 2014-10-05 NOTE — Progress Notes (Addendum)
Physical Therapy Session Note  Patient Details  Name: [name] MRN: [id number] Date of Birth: 1955-08-09  Today's Date: 10/05/2014 PT Individual Time: 1000-1100 PT Individual Time Calculation (min): 60 min   Short Term Goals: Week 1:  PT Short Term Goal 1 (Week 1): STGs=LTGs due to ELOS  Skilled Therapeutic Interventions/Progress Updates:    NMR: Pt received in room agreeable to session. Pt ambulated 150' x2 with supervision and verbal cues for obstacle negotiation on R side. PT instructed patient in NMR reactivities to reinforce attention to R side, improve coordination and balance, and increase strength. PT instructed patient in NuStep x10 minutes at 6 resistance with decreasing verbal cues for attention to R hand and R foot. PT instructed patient in tossing tennis ball between R and L hand to draw attention to R. PT instructed patient in activity on foam reaching to R and L to retrieve horseshoes from mirror. Pt performed activity on foam with feet apart and progressed to feet together. Supervision for safety during activity. Pt negotiated 12 stairs with forward, reciprocal ascent/descent pattern with single UE support.   Therapy Documentation Precautions:  Precautions Precautions: Fall Precaution Comments: pt with both expressive and receptive difficulties, expressive worse than receptive Restrictions Weight Bearing Restrictions: No  Pain: Pain Assessment Pain Assessment: No/denies pain Pain Score: 5  Pain Type: Acute pain Pain Location: Head Pain Descriptors / Indicators: Aching Pain Intervention(s): Medication (See eMAR)   See FIM for current functional status  Therapy/Group: Individual Therapy  [name] E Penven-Crew 10/05/2014, 11:23 AM

## 2014-10-05 NOTE — Significant Event (Signed)
Rapid Response Event Note  Overview: Time Called: 1141 Arrival Time: 3887 Event Type: Hypotension, Neurologic  Initial Focused Assessment: Patient working with speech therapy, standing for about 20 min.  He was progressing well then as they were walking he suddenly became unresponsive, cold and clammy, diaphoretic.  Staff assisted him into a chair.  Initially unable to obtain BP.  Patient has been complaining of nausea and need for bowel movement.  Possible vagal episode per PA  Upon my arrival patient alert, cold and clammy sitting in a wheelchair.  BP 95/70 Assisted patient back to his room.  He remained Alert and conversant.  And continued to improve. BP 112/72 - 122/75  SR 50s   Lung sounds clear, decreased bases, regular HR. 12 lead EKG done  1220 Patient at baseline neuro status and feels much improved. Plan orthostatic VS.  Rn to call if assistance needed.   Interventions:   Event Summary: Name of Physician Notified: Silvestre Mesi PA at bedside at      at    Outcome: Stayed in room and stabalized  Event End Time: [place]  Raliegh Ip

## 2014-10-05 NOTE — Progress Notes (Signed)
Occupational Therapy Note  Patient Details  Name: [name] MRN: [id number] Date of Birth: 08-Feb-1956  Pt missed 30 mins OT session secondary to PA stating to hold therapies this pm secondary to medical event in the am.  Will hope to resume regular therapies in the am on 10/06/14.    [name] OTR/L 10/05/2014, 2:57 PM

## 2014-10-06 ENCOUNTER — Inpatient Hospital Stay (HOSPITAL_COMMUNITY): Payer: BLUE CROSS/BLUE SHIELD

## 2014-10-06 ENCOUNTER — Inpatient Hospital Stay (HOSPITAL_COMMUNITY): Payer: BLUE CROSS/BLUE SHIELD | Admitting: Speech Pathology

## 2014-10-06 ENCOUNTER — Inpatient Hospital Stay (HOSPITAL_COMMUNITY): Payer: BLUE CROSS/BLUE SHIELD | Admitting: Rehabilitation

## 2014-10-06 MED ORDER — BISACODYL 10 MG RE SUPP: 10.0000 mg | Freq: Every day | RECTAL | Status: DC | PRN

## 2014-10-06 NOTE — Progress Notes (Signed)
Physical Therapy Session Note  Patient Details  Name: [name] MRN: [id number] Date of Birth: 01-Jul-1955  Today's Date: 10/06/2014 PT Individual Time: 0800-0900 PT Individual Time Calculation (min): 60 min   Short Term Goals: Week 1:  PT Short Term Goal 1 (Week 1): STGs=LTGs due to ELOS  Skilled Therapeutic Interventions/Progress Updates:    Session focused on addressing functional mobility on unit at S/steady level while addressing cognition including attention, sequencing, sorting and color/number matching during various therapeutic activities. Pt requires extra time and up to max verbal and demonstrational cues to complete task with extra time and then able to perform with about 75-80% accuracy once determining the correct object of the task (ie. Matching suits of cards, colors of bean bags with floor circles, and items on shelf) with overall S/steady A for balance including squatting down to floor and reaching outside BOS during these various activities. Pt with difficulty with 2 step commands during tasks and when performing dynamic gait through obstacle course to simulate community or household mobility including navigating cones, stepping over blocks on floor, and stepping up/down on a step without rails with steady A. Pt able to perform stairs with rails at S level for home and community mobility training. Min verbal cues for safety throughout session due to pt being impulsive at times. End of session left up in recliner with QRB on and call bell in reach.   See Doc Flowsheets for vitals taken during session. HR increased but after activity.  Therapy Documentation Precautions:  Precautions Precautions: Fall Precaution Comments: pt with both expressive and receptive difficulties, expressive worse than receptive Restrictions Weight Bearing Restrictions: No  Pain: Denies pain.  See FIM for current functional status  Therapy/Group: Individual Therapy  Canary Brim Ivory Broad, PT, DPT  10/06/2014, 9:48 AM

## 2014-10-06 NOTE — Progress Notes (Signed)
Occupational Therapy Session Note  Patient Details  Name: [name] MRN: [id number] Date of Birth: 1956/01/16  Today's Date: 10/06/2014 OT Individual Time: 1100-1200 OT Individual Time Calculation (min): 60 min    Short Term Goals: Week 1:  OT Short Term Goal 1 (Week 1): STGs=LTGs based on estimated short LOS  Skilled Therapeutic Interventions/Progress Updates: ADL-retraining (45 min) and Therex (15 min) with focus on family education with spouse present.    Pt performs supervised bathing and dressing standing in shower stall with spouse observing his need for intermittent vc to sequence, setup with supplies and clothing, and min vc to attend to his right.   Pt exhibits no signs of low HR or BP during this session and reports only right lower jaw pain while shaving using electric razor during grooming.  Pt ambulates to ADL apt and completes transfer (step in) to standard tub with standby assist, rejecting recommended use of shower chair for safety.   Pt then ambulates to gym and completes 7 min of therex using NuStep, level 4 resistance, at a rate of 85 steps/minute.   Pt fatigues and requests return to his room for noon meal.  Pt left in room with spouse present and all needs within reach.    Spouse educated on role of SW during team conference d/t her request for info relating to communication with family members after conference.      Therapy Documentation Precautions:  Precautions Precautions: Fall Precaution Comments: pt with both expressive and receptive difficulties, expressive worse than receptive Restrictions Weight Bearing Restrictions: No  Vital Signs: Therapy Vitals Pulse Rate: 76 BP: 132/72 mmHg Patient Position (if appropriate): Sitting   Pain: Pain Assessment Pain Assessment: No/denies pain  See FIM for current functional status  Therapy/Group: Individual Therapy  [place] 10/06/2014, 12:13 PM

## 2014-10-06 NOTE — Progress Notes (Signed)
Speech Language Pathology Daily Session Note  Patient Details  Name: [name] MRN: [id number] Date of Birth: 1955-11-22  Today's Date: 10/06/2014 SLP Individual Time: 1000-1057 SLP Individual Time Calculation (min): 57 min  Short Term Goals: Week 1: SLP Short Term Goal 1 (Week 1): STG=LTG due to ELOS   Skilled Therapeutic Interventions:  Pt was seen for skilled ST targeting cognitive-linguistic goals.  Upon arrival, pt was seated upright in recliner, awake, alert, and agreeable to participate in [place].  Pt ambulated to ST treatment room with min-mod assist multimodal cues for route recall and awareness of obstacles on the right.  SLP facilitated the session with a basic card game activity targeting functional problem solving and following directions.  Pt required max to total assist to match cards by color or number.  Pt was able to more easily identify matches by color versus number, which SLP suspects to be related to number recognition/reading comprehension deficits.  SLP decreased task complexity by focusing only on matching numbers while keeping colors of cards the same.  Pt was able to match numbers with max faded to min assist verbal and visual cues.  SLP was then able to increase task complexity by varying colors of cards.  Pt was then able to match cards with mod faded to min assist verbal cues.  Pt was left in recliner with wife at bedside and call bell within reach.  Continue per current plan of care.    FIM:  Comprehension Comprehension Mode: Auditory Comprehension: 2-Understands basic 25 - 49% of the time/requires cueing 51 - 75% of the time Expression Expression Mode: Verbal Expression: 2-Expresses basic 25 - 49% of the time/requires cueing 50 - 75% of the time. Uses single words/gestures. Social Interaction Social Interaction: 3-Interacts appropriately 50 - 74% of the time - May be physically or verbally inappropriate. Problem Solving Problem Solving: 3-Solves basic 50 -  74% of the time/requires cueing 25 - 49% of the time Memory Memory: 2-Recognizes or recalls 25 - 49% of the time/requires cueing 51 - 75% of the time  Pain Pain Assessment Pain Assessment: No/denies pain  Therapy/Group: Individual Therapy  [name], Selinda Orion 10/06/2014, 12:56 PM

## 2014-10-06 NOTE — Progress Notes (Signed)
Physical Therapy Session Note  Patient Details  Name: [name] MRN: [id number] Date of Birth: Jan 21, 1956  Today's Date: 10/06/2014 PT Individual Time: 1300-1330 PT Individual Time Calculation (min): 30 min   Short Term Goals: Week 1:  PT Short Term Goal 1 (Week 1): STGs=LTGs due to ELOS  Skilled Therapeutic Interventions/Progress Updates:   Pt received sitting in recliner in room, agreeable to therapy session.  Wife present during session to observe.  Skilled session focused on naming numbers, letters in correct order, matching colors with use of RUE for increased attention to R, dynamic standing balance, attention, and sequencing.  Pt ambulated to/from therapy gym at S level with cues to attend to the R to avoid obstacles.  Pt with good recall of where gym located during session.  During cognitive tasks had pt stand on foam airdex pad for increased balance challenge with feet together.  Tolerated well however requires max verbal cues to cease verbalization during task, attend to cues given by therapist, and was able to count only approx 20% correctly, but was able to state letters correctly 80% of time.  Pt continues to perseverate on either numbers or letters during session.  Ambulated back to room and left in recliner with wife present.     Therapy Documentation Precautions:  Precautions Precautions: Fall Precaution Comments: pt with both expressive and receptive difficulties, expressive worse than receptive Restrictions Weight Bearing Restrictions: No   Pain: Pain Assessment Pain Assessment: No/denies pain   Locomotion : Ambulation Ambulation/Gait Assistance: 5: Supervision   See FIM for current functional status  Therapy/Group: Individual Therapy  Denice Bors 10/06/2014, 4:37 PM

## 2014-10-06 NOTE — Consult Note (Signed)
[place]   [name] is a 59 year old, right-handed, Caucasian man, who was seen for an initial diagnostic examination to evaluate his mood and cognitive functioning in the setting of stroke.  According to his medical record, he was admitted on 09/25/14 after undergoing a right ear tympanoplasty with post-operative altered mental status and difficulty communicating.  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  MRI of the brain demonstrated left middle cerebral artery territory infarct.  He did not receive TPA.  Of note, on the day of the current evaluation, staff members informed this practitioner that [name] had a sudden loss of consciousness and was briefly unresponsive during an occupational therapy session.  At the time of the current appointment, [name] stated that he was tired, but felt up to engaging with the neuropsychologist as scheduled.    During the current session, [name] had significant difficulty expressing his thoughts, often making gross paraphasic errors. (e.g. non-semantically or phonemically related word substitutions), with sentences that were choppy and which did not correspond in content, at times, to the topic-at-hand.  He was able to express that he has trouble getting his thoughts out and often "makes stuff up" while talking because of his difficulties.  He acknowledged trouble remembering things people have told him.  He was observed to have significant impairment in comprehension of spoken language, as he was unable to follow even simple instructions (e.g. repeat three words).  It was also noteworthy that [name] made statements indicating that he was familiar with the neuropsychologist and that "what [she] said really helped," though this was their first meeting.    From an emotional standpoint, [name] was able to relay that his mood is generally positive, though he said that he is "pissed that it happened."  He said that he has significant social support and denied symptoms of depression or anxiety.  His biggest complaint was nausea.  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    IMPRESSION:  [name] seems to be experiencing significant impairment in both receptive and expressive language, to an extent that a formal evaluation of cognitive functioning was unable to be conducted.  Staff members should be aware that his spoken language comprehension is extremely poor at times and they should use gestures and visual cues whenever possible in order to communicate more effectively with him.  He may benefit from a comprehensive neuropsychological evaluation as an outpatient in 3-6 months, but sooner evaluation could be conducted if he is observed to have significant improvement in comprehension and verbal expression.  From an emotional standpoint, there were no indications of the presence of clinically significant depression or anxiety, though a follow-up session with the neuropsychologist could be requested in the future, should his mood dramatically worsen or should his treatment team feel that it would be beneficial in informing care.    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

## 2014-10-06 NOTE — Progress Notes (Signed)
[place] PHYSICAL MEDICINE & REHABILITATION     PROGRESS NOTE    Subjective/Complaints:  Otherwise no major complaints--ROS very limited by his aphasia  Objective: Vital Signs: Blood pressure 113/73, pulse 51, temperature 97.6 F (36.4 C), temperature source Oral, resp. rate 17, height 5\' 10"  (1.778 m), weight 89.3 kg (196 lb 13.9 oz), SpO2 100 %. Dg Chest 1 View  10/05/2014   CLINICAL DATA:  59 year old male with atrial fibrillation and basal cell carcinoma. Shortness breath. Subsequent encounter.  EXAM: CHEST  1 VIEW  COMPARISON:  09/25/2014 and 04/15/2014.  FINDINGS: No infiltrate, congestive heart failure or pneumothorax.  Question nipple shadow right lung base. This can be confirmed with nipple marker view.  Heart size within normal limits.  IMPRESSION: No infiltrate or congestive heart failure.  Question nipple shadow right lung base. This can be confirmed with nipple marker view.   Electronically Signed   By: Genia Del M.D.   On: 10/05/2014 13:31    Recent Labs  10/05/14 1200  WBC 8.7  HGB 15.9  HCT 46.7  PLT 289    Recent Labs  10/05/14 1200  NA 138  K 4.4  CL 101  GLUCOSE 132*  BUN 16  CREATININE 1.22  CALCIUM 10.0   CBG (last 3)   Recent Labs  10/05/14 1144 10/05/14 1203  GLUCAP 115* 129*    Wt Readings from Last 3 Encounters:  09/30/14 89.3 kg (196 lb 13.9 oz)  09/26/14 98.8 kg (217 lb 13 oz)  05/28/14 93.441 kg (206 lb)    Physical Exam:  Constitutional: He appears well-developed.  HENT: dentition fair to good, oral mucosa pink and moist Head: Normocephalic.  Eyes: EOM are normal.  Neck: Normal range of motion. Neck supple. No thyromegaly present.  Cardiovascular:  Cardiac rate controlled without murmur Respiratory: Effort normal and breath sounds normal. No respiratory distress. No wheezes or rales GI: Soft. Bowel sounds are normal. He exhibits no distension.  Neurological: He is alert. Dense right HH Patient with expressive >  receptive aphasia with apraxia---a little more fluid at times now.  MMT: Right--- deltoid, biceps, triceps, wrist, HI, HF, KE, ADF, APF 4 to 4+/5 with decreased [place]. LUE and LLE 5/5 prox to distal. Sensory intact to paint and gross touch in all 4. dtr's 2+. Still perseverates on certain  phrases---- Apraxic Skin: Skin is warm and dry. Sutures removed from for head lesion excision site clean and dry  Psychiatric: He has a normal mood and affect. His behavior is normal   Assessment/Plan: 1. Functional deficits secondary to embolic left MCA infarct which require 3+ hours per day of interdisciplinary therapy in a comprehensive inpatient rehab setting. Physiatrist is providing close team supervision and 24 hour management of active medical problems listed below. Physiatrist and rehab team continue to assess barriers to discharge/monitor patient progress toward functional and medical goals. FIM: FIM - Bathing Bathing Steps Patient Completed: Chest, Right Arm, Left Arm, Abdomen, Front perineal area, Right upper leg, Left upper leg, Buttocks Bathing: 4: Min-Patient completes 8-9 75f 10 parts or 75+ percent  FIM - Upper Body Dressing/Undressing Upper body dressing/undressing steps patient completed: Thread/unthread right sleeve of pullover shirt/dresss, Thread/unthread left sleeve of pullover shirt/dress, Put head through opening of pull over shirt/dress, Pull shirt over trunk Upper body dressing/undressing: 5: Supervision: Safety issues/verbal cues FIM - Lower Body Dressing/Undressing Lower body dressing/undressing steps patient completed: Thread/unthread right underwear leg, Thread/unthread left underwear leg, Pull underwear up/down, Thread/unthread right pants leg, Thread/unthread left pants  leg, Pull pants up/down, Don/Doff right shoe, Don/Doff left shoe, Fasten/unfasten right shoe, Fasten/unfasten left shoe, Don/Doff right sock, Don/Doff left sock Lower body dressing/undressing: 4: Steadying  Assist  FIM - Toileting Toileting steps completed by patient: Adjust clothing prior to toileting, Performs perineal hygiene, Adjust clothing after toileting Toileting Assistive Devices: Grab bar or rail for support Toileting: 4: Steadying assist  FIM - Radio producer Devices:  (Pt standing with steady assist to urinate) Toilet Transfers: 4-To toilet/BSC: Min A (steadying Pt. > 75%), 4-From toilet/BSC: Min A (steadying Pt. > 75%)  FIM - Bed/Chair Transfer Bed/Chair Transfer Assistive Devices: Arm rests, Bed rails Bed/Chair Transfer: 4: Bed > Chair or W/C: Min A (steadying Pt. > 75%), 4: Chair or W/C > Bed: Min A (steadying Pt. > 75%)  FIM - Locomotion: Wheelchair Locomotion: Wheelchair: 0: Activity did not occur FIM - Locomotion: Ambulation Locomotion: Ambulation Assistive Devices:  (none) Ambulation/Gait Assistance: 5: Supervision, 4: Min guard Locomotion: Ambulation: 5: Travels 150 ft or more with supervision/safety issues  Comprehension Comprehension Mode: Auditory Comprehension: 2-Understands basic 25 - 49% of the time/requires cueing 51 - 75% of the time  Expression Expression Mode: Verbal Expression: 2-Expresses basic 25 - 49% of the time/requires cueing 50 - 75% of the time. Uses single words/gestures.  Social Interaction Social Interaction: 3-Interacts appropriately 50 - 74% of the time - May be physically or verbally inappropriate.  Problem Solving Problem Solving: 3-Solves basic 50 - 74% of the time/requires cueing 25 - 49% of the time  Memory Memory: 2-Recognizes or recalls 25 - 49% of the time/requires cueing 51 - 75% of the time  Medical Problem List and Plan: 1. Functional deficits secondary to embolic left MCA infarct (?ICA vs cardiac etiology) with expressive receptive aphasia after right ear tympanoplasty 09/24/2014  -remains impaired quite a bit by his right HH and lack of ability to compensate for it in space. 2. DVT  Prophylaxis/Anticoagulation: Eliquis required due to afib. No current bleeding issues 3. Pain Management: Tylenol as needed. No pain at present 4. Hypertension/atrial fibrillation. Lopressor 12.5 mg twice a day. HR controlled, regular rhythm, BP in lower range sys 95-113, HR low at 53 Borderline orthostasis, will reduce to QD 5. Neuropsych: This patient is not capable of making decisions on his own behalf. 6. Skin/Wound Care: oob, nutrition 7. Fluids/Electrolytes/Nutrition: eating 8/8 labs reviewed no sign of pre renal azotemia  8. Hyperlipidemia. Lipitor for secondary stroke prevention 9. Right ICA stenosis. To be followed-up outpatient with vascular surgery. Left ICA occluded 10.  Abd pain,exam unremarkable constipation, added senna yesterday, pt states no BM? But daily BM recorded LOS (Days) 7 A FACE TO FACE EVALUATION WAS PERFORMED  Charlett Blake 10/06/2014 6:23 AM

## 2014-10-07 ENCOUNTER — Inpatient Hospital Stay (HOSPITAL_COMMUNITY): Payer: BLUE CROSS/BLUE SHIELD | Admitting: Speech Pathology

## 2014-10-07 ENCOUNTER — Inpatient Hospital Stay (HOSPITAL_COMMUNITY): Payer: BLUE CROSS/BLUE SHIELD | Admitting: Physical Therapy

## 2014-10-07 ENCOUNTER — Inpatient Hospital Stay (HOSPITAL_COMMUNITY): Payer: BLUE CROSS/BLUE SHIELD | Admitting: Occupational Therapy

## 2014-10-07 NOTE — Patient Care Conference (Signed)
Inpatient RehabilitationTeam Conference and Plan of Care Update Date: 10/07/2014   Time: 11;30 AM    Patient Name: [name]      Medical Record Number: [id number]  Date of Birth: [birth year] Sex: Male         Room/Bed: 4M04C/4M04C-01 Payor Info: Payor: BLUE CROSS BLUE SHIELD / Plan: BCBS OTHER / Product Type: *No Product type* /    Admitting Diagnosis: nih 8  Admit Date/Time:  09/29/2014  4:23 PM Admission Comments: No comment available   Primary Diagnosis:  Embolism of left middle cerebral artery Principal Problem: Embolism of left middle cerebral artery  Patient Active Problem List   Diagnosis Date Noted  . 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 Left middle cerebral artery stroke 09/29/2014  . Acute ischemic left MCA stroke   . ICAO (internal carotid artery occlusion)   . Carotid stenosis   . CVA (cerebral infarction) 09/25/2014  . Stroke 09/25/2014  . PAF (paroxysmal atrial fibrillation) 09/25/2014  . Acute CVA (cerebrovascular accident)   . SOB (shortness of breath) 05/19/2014  . Heart palpitations 05/19/2014  . Atrial fibrillation     Expected Discharge Date: Expected Discharge Date: 10/09/14  Team Members Present: Physician leading conference: Dr. Alysia Penna Social Worker Present: Ovidio Kin, LCSW Nurse Present: Heather Roberts, RN PT Present: [place] Shi, Cecille Rubin, PT OT Present: Benay Pillow, Artemio Aly, OT SLP Present: Windell Moulding, SLP PPS Coordinator present : Daiva Nakayama, RN, CRRN     Current Status/Progress Goal Weekly Team Focus  Medical   apraxia speech and motor  home with sup,   D/C planning   Bowel/Bladder   Pt continent of bowel and bladder. LBM 8-9.  supervision  regular BMs. contiune POC   Swallow/Nutrition/ Hydration     na        ADL's   steady assist for shower transfer and LB self care all other tasks with supervision and min cues for safety   supervision overall   pt/family education, self care retraining, safety, functional mobility   Mobility   supervision-mod I  supervision  dynamic stand balance, R side attention, aphasia & safety awareness   Communication   Severe Wernicke's aphasia, tangential verbal output, decreased auditory comprehension and awareness of verbal errors   mod A  self regulation of tangential verbal output, awareness of verbal errors    Safety/Cognition/ Behavioral Observations  Moderate cognitive deficits, impulsive, decreased sustained attention, right inattention  Mod A   awareness of obstacles on the right, safety awareness, functional problem solving    Pain   no complaints of pain         Skin   incision behind Right ear OTA  remain free of skin breakdown and infection  assess skin q shift      *See Care Plan and progress notes for long and short-term goals.  Barriers to Discharge: severe receptive aphaic    Possible Resolutions to Barriers:  long term SLP as outpt    Discharge Planning/Teaching Needs:  Home with wife and relatives coming from [place] to assist wife with 24 hr supervision, wife wants to know for how long?      Team Discussion:  Goals-supervision level-safety and communication-expressive and receptive issues. Headache pain controlled. Suspect r-field cut runs into things to the right. Needs cues for safety. Will require 24 hr supervision due to safety and speech issues. Recommended OP therapies. Wife has been here to participate in  therapies  Revisions to Treatment Plan:  None   Continued Need for Acute Rehabilitation Level of Care: The patient requires daily medical management by a physician with specialized training in physical medicine and rehabilitation for the following conditions: Daily direction of a multidisciplinary physical rehabilitation program to ensure safe treatment while eliciting the highest outcome that is of practical value to the patient.: Yes Daily medical management of patient  stability for increased activity during participation in an intensive rehabilitation regime.: Yes Daily analysis of laboratory values and/or radiology reports with any subsequent need for medication adjustment of medical intervention for : Neurological problems;Other  Asahd Can, Gardiner Rhyme 10/07/2014, 1:19 PM

## 2014-10-07 NOTE — Progress Notes (Signed)
[place] PHYSICAL MEDICINE & REHABILITATION     PROGRESS NOTE    Subjective/Complaints: Remains severely aphasic "doing well" Points to stomach- ok Otherwise no major complaints--ROS very limited by his aphasia  Objective: Vital Signs: Blood pressure 121/75, pulse 58, temperature 97.8 F (36.6 C), temperature source Oral, resp. rate 18, height 5\' 10"  (1.778 m), weight 89.3 kg (196 lb 13.9 oz), SpO2 100 %. Dg Chest 1 View  10/05/2014   CLINICAL DATA:  59 year old male with atrial fibrillation and basal cell carcinoma. Shortness breath. Subsequent encounter.  EXAM: CHEST  1 VIEW  COMPARISON:  09/25/2014 and 04/15/2014.  FINDINGS: No infiltrate, congestive heart failure or pneumothorax.  Question nipple shadow right lung base. This can be confirmed with nipple marker view.  Heart size within normal limits.  IMPRESSION: No infiltrate or congestive heart failure.  Question nipple shadow right lung base. This can be confirmed with nipple marker view.   Electronically Signed   By: Genia Del M.D.   On: 10/05/2014 13:31    Recent Labs  10/05/14 1200  WBC 8.7  HGB 15.9  HCT 46.7  PLT 289    Recent Labs  10/05/14 1200  NA 138  K 4.4  CL 101  GLUCOSE 132*  BUN 16  CREATININE 1.22  CALCIUM 10.0   CBG (last 3)   Recent Labs  10/05/14 1144 10/05/14 1203  GLUCAP 115* 129*    Wt Readings from Last 3 Encounters:  09/30/14 89.3 kg (196 lb 13.9 oz)  09/26/14 98.8 kg (217 lb 13 oz)  05/28/14 93.441 kg (206 lb)    Physical Exam:  Constitutional: He appears well-developed.  HENT: dentition fair to good, oral mucosa pink and moist Head: Normocephalic.  Eyes: EOM are normal.  Neck: Normal range of motion. Neck supple. No thyromegaly present.  Cardiovascular:  Cardiac rate controlled without murmur Respiratory: Effort normal and breath sounds normal. No respiratory distress. No wheezes or rales GI: Soft. Bowel sounds are normal. He exhibits no distension.   Neurological: He is alert. Dense right HH Patient with expressive > receptive aphasia with apraxia---perseverative.  MMT: Right--- deltoid, biceps, triceps, wrist, HI, HF, KE, ADF, APF 4 to 4+/5 with decreased [place]. LUE and LLE 5/5 prox to distal. Sensory intact to  dtr's 2+.  Motor apraxia evident as well  Skin: Skin is warm and dry. Sutures removed from for head lesion excision site clean and dry  Psychiatric: He has a normal mood and affect. His behavior is normal   Assessment/Plan: 1. Functional deficits secondary to embolic left MCA infarct which require 3+ hours per day of interdisciplinary therapy in a comprehensive inpatient rehab setting. Physiatrist is providing close team supervision and 24 hour management of active medical problems listed below. Physiatrist and rehab team continue to assess barriers to discharge/monitor patient progress toward functional and medical goals. FIM: FIM - Bathing Bathing Steps Patient Completed: Chest, Right Arm, Left Arm, Abdomen, Front perineal area, Right upper leg, Left upper leg, Buttocks Bathing: 4: Min-Patient completes 8-9 63f 10 parts or 75+ percent  FIM - Upper Body Dressing/Undressing Upper body dressing/undressing steps patient completed: Thread/unthread right sleeve of pullover shirt/dresss, Thread/unthread left sleeve of pullover shirt/dress, Put head through opening of pull over shirt/dress, Pull shirt over trunk Upper body dressing/undressing: 5: Supervision: Safety issues/verbal cues FIM - Lower Body Dressing/Undressing Lower body dressing/undressing steps patient completed: Thread/unthread right underwear leg, Thread/unthread left underwear leg, Pull underwear up/down, Thread/unthread right pants leg, Thread/unthread left pants leg, Pull pants up/down,  Don/Doff right shoe, Don/Doff left shoe, Fasten/unfasten right shoe, Fasten/unfasten left shoe, Don/Doff right sock, Don/Doff left sock Lower body dressing/undressing: 4: Steadying  Assist  FIM - Toileting Toileting steps completed by patient: Adjust clothing prior to toileting, Performs perineal hygiene, Adjust clothing after toileting Toileting Assistive Devices: Grab bar or rail for support Toileting: 4: Steadying assist  FIM - Radio producer Devices:  (Pt standing with steady assist to urinate) Toilet Transfers: 4-To toilet/BSC: Min A (steadying Pt. > 75%), 4-From toilet/BSC: Min A (steadying Pt. > 75%)  FIM - Bed/Chair Transfer Bed/Chair Transfer Assistive Devices: Arm rests, Bed rails Bed/Chair Transfer: 5: Chair or W/C > Bed: Supervision (verbal cues/safety issues), 4: Bed > Chair or W/C: Min A (steadying Pt. > 75%)  FIM - Locomotion: Wheelchair Locomotion: Wheelchair: 0: Activity did not occur FIM - Locomotion: Ambulation Locomotion: Ambulation Assistive Devices: Other (comment) (none) Ambulation/Gait Assistance: 5: Supervision Locomotion: Ambulation: 5: Travels 150 ft or more with supervision/safety issues  Comprehension Comprehension Mode: Auditory Comprehension: 3-Understands basic 50 - 74% of the time/requires cueing 25 - 50%  of the time  Expression Expression Mode: Verbal Expression: 3-Expresses basic 50 - 74% of the time/requires cueing 25 - 50% of the time. Needs to repeat parts of sentences.  Social Interaction Social Interaction: 3-Interacts appropriately 50 - 74% of the time - May be physically or verbally inappropriate.  Problem Solving Problem Solving: 3-Solves basic 50 - 74% of the time/requires cueing 25 - 49% of the time  Memory Memory: 2-Recognizes or recalls 25 - 49% of the time/requires cueing 51 - 75% of the time  Medical Problem List and Plan: 1. Functional deficits secondary to embolic left MCA infarct (?ICA vs cardiac etiology) with expressive receptive aphasia after right ear tympanoplasty 09/24/2014  -remains impaired quite a bit by his right HH and lack of ability to compensate for it in  space. 2. DVT Prophylaxis/Anticoagulation: Eliquis required due to afib. No current bleeding issues 3. Pain Management: Tylenol as needed. No pain at present 4. Hypertension/atrial fibrillation. Lopressor 12.5 mg twice a day. HR controlled, regular rhythm, BP in lower range sys 95-113, HR low at 53 Borderline orthostasis, will reduce to QD 5. Neuropsych: This patient is not capable of making decisions on his own behalf. 6. Skin/Wound Care: oob, nutrition 7. Fluids/Electrolytes/Nutrition:10-90% meals recorded intake 8. Hyperlipidemia. Lipitor for secondary stroke prevention 9. Right ICA stenosis. To be followed-up outpatient with vascular surgery. Left ICA occluded 10.  Abd pain,exam unremarkable constipation, added senna yesterday, large BM yesterday LOS (Days) 8 A FACE TO FACE EVALUATION WAS PERFORMED  KIRSTEINS,ANDREW E 10/07/2014 6:16 AM

## 2014-10-07 NOTE — Progress Notes (Signed)
Speech Language Pathology Daily Session Note  Patient Details  Name: [name] MRN: [id number] Date of Birth: [birth year]  Today's Date: 10/07/2014 SLP Individual Time: 1401-1500 SLP Individual Time Calculation (min): 59 min  Short Term Goals: Week 1: SLP Short Term Goal 1 (Week 1): STG=LTG due to ELOS   Skilled Therapeutic Interventions:  Pt was seen for skilled ST targeting communication goals.  Upon arrival, pt was seated upright in recliner, awake, alert, and agreeable to participate in [place].  Pt ambulated to ST treatment room with supervision cues for route recall and min multimodal cues for awareness of obstacles on the right.  SLP facilitated the treatment session with automatic sequences to facilitate awareness of verbal errors in a structured context.  Pt counted from 1 to 10 with max faded to min assist verbal and visual cues to recognize and correct perseveration of letter recitation.  Pt was also able to name numbers in isolation with the use of a carrier phrase of 3-4 preceding numbers with max faded to min assist verbal cues to recognize and correct verbal errors.  This skill did not carryover when naming days of the week and pt was max to total assist to name one day.  Pt with x3 islands of clear verbal output throughout session at the sentence level.  Pt was also able to match picture to object and object to word from a field of 2 with supervision cues.  Pt was returned to room, left in recliner, with wife at bedside and call bell within reach.  Continue per current plan of care.      FIM:  Comprehension Comprehension Mode: Auditory Comprehension: 3-Understands basic 50 - 74% of the time/requires cueing 25 - 50%  of the time Expression Expression Mode: Verbal Expression: 3-Expresses basic 50 - 74% of the time/requires cueing 25 - 50% of the time. Needs to repeat parts of sentences. Social Interaction Social Interaction: 3-Interacts appropriately 50 - 74% of the time - May  be physically or verbally inappropriate. Problem Solving Problem Solving: 3-Solves basic 50 - 74% of the time/requires cueing 25 - 49% of the time Memory Memory: 2-Recognizes or recalls 25 - 49% of the time/requires cueing 51 - 75% of the time FIM - Eating Eating Activity: 6: More than reasonable amount of time  Pain Pain Assessment Pain Assessment: No/denies pain  Therapy/Group: Individual Therapy  Zela Sobieski, Selinda Orion 10/07/2014, 5:36 PM

## 2014-10-07 NOTE — Progress Notes (Signed)
Social Work Patient ID: [name], male   DOB: 1955/03/14, 59 y.o.   MRN: [id number] Met with pt and wife to discuss team conference goals supervision level and discharge 8/12.  Wife has been here and participating in therapies with him. She will need to coordinate their  Family members coming here form [place] to assist with his care. Both agreeable to OP therapies and will let know when first appointment made. No equipment recommended. Work toward Discharge Friday.

## 2014-10-07 NOTE — Progress Notes (Signed)
Occupational Therapy Session Note  Patient Details  Name: [name] MRN: [id number] Date of Birth: 1956-02-22  Today's Date: 10/07/2014 OT Individual Time: 0700-0755 OT Individual Time Calculation (min): 55 min    Short Term Goals: Week 1:  OT Short Term Goal 1 (Week 1): STGs=LTGs based on estimated short LOS  Skilled Therapeutic Interventions/Progress Updates:  Upon entering the room, pt supine in bed with no c/o pain this session. Pt seated on EOB and opening dresser to obtain clothing items needed for bathing and dressing this session. Pt ambulated to bathroom with supervision and without use of AD. Pt standing at toilet to urinate with supervision for safety. Pt then ambulating into shower with supervision and steady assist when exiting the shower. Pt engaged in bathing at shower level and standing primarily during session but seated with min verbal cues for safety when washing B LEs. Pt ambulated to EOB and seated with sit <>stand and steady assist for LB dressing. Pt standing at sink side for all of grooming tasks with supervision and pt not requiring verbal cues for sequencing or initiation of tasks. Pt ambulating 150' towards day room with close supervision and no use of AD. Pt standing and water plants as appropriate and ambulating with supervision and water container . Pt returning to room in same manner as stated above. Pt seated in recliner chair with quick release belt donned and call bell and all needed items within reach upon exiting the room. Breakfast tray placed in front of pt.   Therapy Documentation Precautions:  Precautions Precautions: Fall Precaution Comments: pt with both expressive and receptive difficulties, expressive worse than receptive Restrictions Weight Bearing Restrictions: No  Vital Signs: Therapy Vitals Temp: 97.8 F (36.6 C) Temp Source: Oral Pulse Rate: (!) 58 Resp: 18 BP: 121/75 mmHg Patient Position (if appropriate): Lying Oxygen  Therapy SpO2: 100 % O2 Device: Not Delivered  See FIM for current functional status  Therapy/Group: Individual Therapy  Phineas Semen 10/07/2014, 7:59 AM

## 2014-10-07 NOTE — Progress Notes (Signed)
Physical Therapy Weekly Progress Note  Patient Details  Name: [name] MRN: [id number] Date of Birth: 03-07-1955  Beginning of progress report period: September 30, 2014 End of progress report period: October 07, 2014  Today's Date: 10/07/2014 PT Individual Time:  -  [phone] Treatment Session 2: 1130-1200 Treatment Session 1: 60 min Treatment Session 2: 30 min      Patient has met  9 of 10 long term goals.  Short term goals not set due to estimated length of stay.  Pt continues to req supervision for dynamic stand balance and has made excellent progress with all aspects of functional mobility - grossly SBA-mod I.   Patient continues to demonstrate the following deficits: R side inattention, global aphasia, impaired safety awareness and cognitive deficits in problem solving and therefore will continue to benefit from skilled PT intervention to enhance overall performance with balance, ability to compensate for deficits, attention, awareness and coordination.  See Patient's Care Plan for progression toward long term goals.  Patient progressing toward long term goals..  Continue plan of care.  Skilled Therapeutic Interventions/Progress Updates:    Treatment Session 1: Gait Training: PT instructs pt in ambulation in controlled and home environment x 300' req SBA and minimal cues for attention to R side to avoid hitting obstacles (computer on wheels, etc).  PT instructs pt in ascending/descending a full flight (12+12 steps) of stairs, first with L handrail only, then with R handrail only - req minimal cues for R hand to be placed on rail req SBA.   Therapeutic Activity: PT instructs pt in sorting clothespins by color into box - req max cues for understanding activity, but then able to place clothespins into correct piles with increased time with minimal cues, unable to say any color except "pink" and req max cues to identify colored piles.  PT instructs pt in floor recovery req  demonstration and then pt completes with supervision - no LOB.   Community Integration: PT instructs pt in community integration - decreased awareness of obstacles & people to his R side. Wife showed up during this portion of the session and PT educates wife to always walk on R side of pt in order to promote awareness to R environment, as well as assist in protecting him from obstacles.   Pt continues to have severe global aphasia and R side inattention, which limits his understanding of activities & safety.    Treatment Session 2: Neuromuscular Reeducation: PT instructs pt and wife in standing dynamic balance HEP using [place] exercises: sit to stands without arm support, stairs (with one rail), tandem stance, single leg stance, figure 8 walking around 2 obstacles, squats (much practice on technique, but PT settles on mini-squats as pt does not understanding how to stick bottom out, but trunk remains erect), side stepping holding wife's hands, and backwards walking while facing wife and holding hands.   PT explains to wife to use pantomime and demonstration to communicate to him and that he does ok using writing/drawing with pen and paper to communicate with her, for home use. Wife demonstrates ability to safely assist and instruct pt in HEP. PT explains pt is only to do these on days he does not have therapy and that he can have a rest day if he desires. Continue per PT POC.    Therapy Documentation Precautions:  Precautions Precautions: Fall Precaution Comments: pt with both expressive and receptive difficulties, expressive worse than receptive Restrictions Weight Bearing Restrictions: No Pain: Pain Assessment  Pain Assessment: No/denies pain  Treatment Session 2: Pt denies pain.   See FIM for current functional status  Therapy/Group: Individual Therapy  Sharonne Ricketts M 10/07/2014, 8:41 AM

## 2014-10-08 ENCOUNTER — Inpatient Hospital Stay (HOSPITAL_COMMUNITY): Payer: BLUE CROSS/BLUE SHIELD | Admitting: Speech Pathology

## 2014-10-08 ENCOUNTER — Inpatient Hospital Stay (HOSPITAL_COMMUNITY): Payer: BLUE CROSS/BLUE SHIELD | Admitting: Physical Therapy

## 2014-10-08 ENCOUNTER — Inpatient Hospital Stay (HOSPITAL_COMMUNITY): Payer: BLUE CROSS/BLUE SHIELD | Admitting: Occupational Therapy

## 2014-10-08 NOTE — Progress Notes (Signed)
[facility]     PROGRESS NOTE    Subjective/Complaints: Remains severely aphasic "doing well"  Otherwise no major complaints--ROS very limited by his aphasia  Objective: Vital Signs: Blood pressure 101/72, pulse 72, temperature 97.7 F (36.5 C), temperature source Oral, resp. rate 18, height 5\' 10"  (1.778 m), weight 85.503 kg (188 lb 8 oz), SpO2 100 %. No results found.  Recent Labs  10/05/14 1200  WBC 8.7  HGB 15.9  HCT 46.7  PLT 289    Recent Labs  10/05/14 1200  NA 138  K 4.4  CL 101  GLUCOSE 132*  BUN 16  CREATININE 1.22  CALCIUM 10.0   CBG (last 3)   Recent Labs  10/05/14 1144 10/05/14 1203  GLUCAP 115* 129*    Wt Readings from Last 3 Encounters:  10/07/14 85.503 kg (188 lb 8 oz)  09/26/14 98.8 kg (217 lb 13 oz)  05/28/14 93.441 kg (206 lb)    Physical Exam:  Constitutional: He appears well-developed.  HENT: dentition fair to good, oral mucosa pink and moist Head: Normocephalic.  Eyes: EOM are normal.  Neck: Normal range of motion. Neck supple. No thyromegaly present.  Cardiovascular:  Cardiac rate controlled without murmur Respiratory: Effort normal and breath sounds normal. No respiratory distress. No wheezes or rales GI: Soft. Bowel sounds are normal. He exhibits no distension.  Neurological: He is alert. partial right [place] Patient with expressive > receptive aphasia with apraxia---perseverative.  MMT: Right--- deltoid, biceps, triceps, wrist, HI, HF, KE, ADF, APF 4 to 4+/5 with decreased [place]. LUE and LLE 5/5 prox to distal. Sensory intact to  dtr's 2+.  Motor apraxia evident as well  Skin: Skin is warm and dry Psychiatric: He has a normal mood and affect. His behavior is normal   Assessment/Plan: 1. Functional deficits secondary to embolic left MCA infarct which require 3+ hours per day of interdisciplinary therapy in a comprehensive inpatient rehab setting. Physiatrist is providing close team  supervision and 24 hour management of active medical problems listed below. Physiatrist and rehab team continue to assess barriers to discharge/monitor patient progress toward functional and medical goals. Should be ready for d/c in am FIM: FIM - Bathing Bathing Steps Patient Completed: Chest, Right Arm, Left Arm, Abdomen, Front perineal area, Right upper leg, Left upper leg, Buttocks, Right lower leg (including foot), Left lower leg (including foot) Bathing: 5: Supervision: Safety issues/verbal cues  FIM - Upper Body Dressing/Undressing Upper body dressing/undressing steps patient completed: Thread/unthread right sleeve of pullover shirt/dresss, Thread/unthread left sleeve of pullover shirt/dress, Put head through opening of pull over shirt/dress, Pull shirt over trunk Upper body dressing/undressing: 5: Supervision: Safety issues/verbal cues FIM - Lower Body Dressing/Undressing Lower body dressing/undressing steps patient completed: Thread/unthread right underwear leg, Thread/unthread left underwear leg, Pull underwear up/down, Thread/unthread right pants leg, Thread/unthread left pants leg, Pull pants up/down, Don/Doff right shoe, Don/Doff left shoe, Fasten/unfasten right shoe, Fasten/unfasten left shoe, Don/Doff right sock, Don/Doff left sock Lower body dressing/undressing: 4: Steadying Assist  FIM - Toileting Toileting steps completed by patient: Adjust clothing prior to toileting, Performs perineal hygiene, Adjust clothing after toileting Toileting Assistive Devices: Grab bar or rail for support Toileting: 5: Supervision: Safety issues/verbal cues  FIM - Radio producer Devices:  (Pt standing with steady assist to urinate) Toilet Transfers: 5-To toilet/BSC: Supervision (verbal cues/safety issues), 5-From toilet/BSC: Supervision (verbal cues/safety issues)  FIM - Control and instrumentation engineer Devices: Arm rests Bed/Chair Transfer: 5: Bed >  Chair or W/C: Supervision (verbal cues/safety issues), 5: Chair or W/C > Bed: Supervision (verbal cues/safety issues), 7: Supine > Sit: No assist, 7: Sit > Supine: No assist  FIM - Locomotion: Wheelchair Locomotion: Wheelchair: 0: Activity did not occur FIM - Locomotion: Ambulation Locomotion: Ambulation Assistive Devices: Other (comment) (no AD) Ambulation/Gait Assistance: 5: Supervision Locomotion: Ambulation: 5: Travels 150 ft or more with supervision/safety issues  Comprehension Comprehension Mode: Auditory Comprehension: 3-Understands basic 50 - 74% of the time/requires cueing 25 - 50%  of the time  Expression Expression Mode: Verbal Expression: 3-Expresses basic 50 - 74% of the time/requires cueing 25 - 50% of the time. Needs to repeat parts of sentences.  Social Interaction Social Interaction: 3-Interacts appropriately 50 - 74% of the time - May be physically or verbally inappropriate.  Problem Solving Problem Solving: 3-Solves basic 50 - 74% of the time/requires cueing 25 - 49% of the time  Memory Memory: 2-Recognizes or recalls 25 - 49% of the time/requires cueing 51 - 75% of the time  Medical Problem List and Plan: 1. Functional deficits secondary to embolic left MCA infarct (?ICA vs cardiac etiology) with expressive receptive aphasia after right ear tympanoplasty 09/24/2014   2. DVT Prophylaxis/Anticoagulation: Eliquis required due to afib. No current bleeding issues 3. Pain Management: Tylenol as needed. No pain at present 4. Hypertension/atrial fibrillation. Lopressor 12.5 mg twice a day. HR controlled, regular rhythm, BP in lower range sys 95-113, HR low at 53 Borderline orthostasis, will reduce to QD 5. Neuropsych: This patient is not capable of making decisions on his own behalf. 6. Skin/Wound Care: oob, nutrition 7. Fluids/Electrolytes/Nutrition:10-90% meals recorded intake 8. Hyperlipidemia. Lipitor for secondary stroke prevention 9. Right ICA stenosis. To be  followed-up outpatient with vascular surgery. Left ICA occluded  LOS (Days) 9 A FACE TO FACE EVALUATION WAS PERFORMED  Charlett Blake 10/08/2014 7:14 AM

## 2014-10-08 NOTE — Progress Notes (Signed)
Physical Therapy Discharge Summary  Patient Details  Name: [name] MRN: [id number] Date of Birth: 1955/08/01  Today's Date: 10/08/2014 PT Individual Time: 0900-1000 Treatment Session 2: 1430-1500 PT Individual Time Calculation (min): 60 min  Treatment Session 2: 30 min   Patient has met 9 of 9 long term goals due to improved activity tolerance, improved balance, improved postural control, increased strength, ability to compensate for deficits, improved attention, improved awareness and improved coordination.  Patient to discharge at an ambulatory level Supervision.   Patient's care partner is independent to provide the necessary cognitive assistance at discharge.  Recommendation:  Patient will benefit from ongoing skilled PT services in outpatient setting to continue to advance safe functional mobility, address ongoing impairments in return to tennis and high level balance activities, and minimize fall risk.  Equipment: No equipment provided  Reasons for discharge: treatment goals met and discharge from hospital  Patient/family agrees with progress made and goals achieved: Yes  Therapeutic Intervention: Treatment Session 1: Community Integration: PT instructs wife in pt's limit of outdoor mobility safety - pt does well with curb management and ambulating on uneven paved surfaces include steep and shallow ramps, but req min A when "hiking" on mulch up steep hills.   Gait Training: Pt ambulates on unit in controlled environment 500' req SBA for safety - with fatigue pt demonstrates increased pathway deviations.  Pt req SBA for safety using R handrail to ascend a flight of steps (12 + 12) in step-over-step alternating pattern.   Therapeutic Activity: Pt's bed mobility is independent and transfers req Supervision for safety.  Pt req demonstrative cues for car transfer not to hold onto door on exit - low car in simulated Tran-Sit system.   Neuromuscular Reeducation: PT  instructs pt in 5TSTS and pt scores: 10.6 seconds.  PT instructs pt in TUG x 3 attempts without AD scoring: 8.25, 9.21, and 8.85 seconds; AVG: 8.77 sec.   Pt's wife has many questions re: return to sport (Tennis and swimming). PT explains that she needs to accompany pt to outpatient PT eval and explain that this is his goal and outpatient PT can simulate Tennis activities and deem if pt's coordination and balance are safe for him to return to this activity. Re: the pool, wife reports pt's pool has steps with a rail to enter/exit in the shallow end and PT explains that if wife is right beside him and he wants to get wet and wade around in shallow end, that should be fine as long as she is beside him on entering/exiting pool and in the pool. Also, to have a chair nearbye the top of the pool so pt can dry feet/legs, and to be sure to walk on pt's R side between him and the pool to avoid stepping accidentally into pool due to R side mild inattention. Wife verbalizes understanding.   Treatment Session 2: Therapeutic Exercise: Pt requests to use Nu-Step - PT places pt on nu-step B LEs and UEs at L4 x 10 minutes, vitals checked after 5 and then 10 minutes - stable each time.   Neuromuscular Reeducation: PT gives wife HEP handout and has her direct pt in HEP: sit to stand without hand use x 10 reps, mini-squats at counter x 10 reps, tandem stance x 10 second hold with single UE support on cabinet, single leg stance x 10 second hold with single UE support on cabinet, figure 8 walking after demonstration around 2 cones, side stepping R/L holding hands, backwards walking  holding hands.   Wife is competent in directing pt in HEP. Pt is safe to d/c home with outpatient PT for follow up.    PT Discharge Precautions/Restrictions Precautions Precautions: Fall Precaution Comments: pt with both expressive and receptive difficulties, expressive worse than receptive Restrictions Weight Bearing Restrictions:  No Pain Pain Assessment Pain Assessment: No/denies pain Faces Pain Scale: Hurts a little bit Pain Type: Acute pain Pain Location: Head Pain Descriptors / Indicators: Aching;Headache Pain Onset: Gradual Pain Intervention(s): RN made aware Multiple Pain Sites: No  Treatment Session 2: Pt denies pain  Cognition Overall Cognitive Status: Impaired/Different from baseline Arousal/Alertness: Awake/alert Orientation Level: Other (comment) (difficult to assess secondary to aphasia) Attention: Focused;Sustained Focused Attention: Appears intact Sustained Attention: Impaired Sustained Attention Impairment: Functional complex;Verbal complex Awareness: Impaired Problem Solving: Impaired Problem Solving Impairment: Functional basic;Functional complex Executive Function: Sequencing;Organizing Sequencing: Impaired Sequencing Impairment: Functional complex Organizing: Impaired Organizing Impairment: Functional complex Behaviors: Impulsive;Perseveration Safety/Judgment: Impaired Sensation Sensation Light Touch: Appears Intact Stereognosis: Not tested Hot/Cold: Appears Intact Proprioception: Impaired Detail Proprioception Impaired Details: Impaired RUE Additional Comments: slight ataxia and impaired end point destination with eyes closed finger to nose on R side Coordination Gross Motor Movements are Fluid and Coordinated: Yes Fine Motor Movements are Fluid and Coordinated: No Coordination and Movement Description: slight ataxia R UE Finger Nose Finger Test: impaired R side Heel Shin Test: slighly less motor control on R side Motor  Motor Motor: Motor apraxia;Ataxia;Motor perseverations  Mobility Bed Mobility Bed Mobility: Rolling Left;Supine to Sit;Sit to Supine Rolling Left: 7: Independent Supine to Sit: 7: Independent Sit to Supine: 7: Independent Transfers Transfers: Yes Sit to Stand: 7: Independent;From bed Stand to Sit: 7: Independent Stand Pivot Transfers: 5:  Supervision Stand Pivot Transfer Details (indicate cue type and reason): visual cues for safety Locomotion  Ambulation Ambulation: Yes Ambulation/Gait Assistance: 5: Supervision Ambulation Distance (Feet): 500 Feet Assistive device: None Ambulation/Gait Assistance Details: Visual cues/gestures for precautions/safety Gait Gait: Yes Gait Pattern: Impaired Gait Pattern:  (decreased attention to R environment/ pathway deviations with fatigue) Gait velocity: normal for age High Level Ambulation High Level Ambulation: Side stepping;Backwards walking Side Stepping: impaired to R side; wfl to L side Backwards Walking: slow pace and impaired balance Stairs / Additional Locomotion Stairs: Yes Stairs Assistance: 5: Supervision Stairs Assistance Details: Visual cues/gestures for precautions/safety Stairs Assistance Details (indicate cue type and reason): SBA with 1 rail Stair Management Technique: One rail Right;One rail Left Number of Stairs: 24 Height of Stairs: 6 Wheelchair Mobility Wheelchair Mobility: No (due to pt ambulatory status)  Trunk/Postural Assessment  Cervical Assessment Cervical Assessment: Within Functional Limits Thoracic Assessment Thoracic Assessment: Within Functional Limits Lumbar Assessment Lumbar Assessment: Within Functional Limits Postural Control Postural Control: Within Functional Limits  Balance Balance Balance Assessed: Yes Static Sitting Balance Static Sitting - Balance Support: No upper extremity supported;Feet supported Static Sitting - Level of Assistance: 7: Independent Dynamic Sitting Balance Dynamic Sitting - Balance Support: During functional activity Dynamic Sitting - Level of Assistance: 7: Independent Static Standing Balance Static Standing - Balance Support: No upper extremity supported Static Standing - Level of Assistance: 7: Independent Dynamic Standing Balance Dynamic Standing - Balance Support: Right upper extremity supported;Left  upper extremity supported;During functional activity Dynamic Standing - Level of Assistance: 6: Modified independent (Device/Increase time) Extremity Assessment  RUE Assessment RUE Assessment: Exceptions to [facility] RUE AROM (degrees) Overall AROM Right Upper Extremity: Within functional limits for tasks performed RUE Strength RUE Overall Strength: Within Functional Limits for tasks performed RUE Tone  RUE Tone: Mild RUE Tone Comments: mild ataxia LUE Assessment LUE Assessment: Within Functional Limits RLE Assessment RLE Assessment: Exceptions to [facility] RLE AROM (degrees) Overall AROM Right Lower Extremity: Within functional limits for tasks assessed RLE Strength RLE Overall Strength: Within Functional Limits for tasks assessed RLE Tone RLE Tone: Mild;Other (comment) (ataxia) LLE Assessment LLE Assessment: Within Functional Limits  See FIM for current functional status  Jakye Mullens M 10/08/2014, 12:27 PM

## 2014-10-08 NOTE — Discharge Instructions (Signed)
Inpatient Rehab Discharge Instructions  [place] Discharge date and time: No discharge date for patient encounter.   Activities/Precautions/ Functional Status: Activity: activity as tolerated Diet: regular diet Wound Care: none needed Functional status:  ___ No restrictions     ___ Walk up steps independently _x__ 24/7 supervision/assistance   ___ Walk up steps with assistance ___ Intermittent supervision/assistance  ___ Bathe/dress independently ___ Walk with walker     ___ Bathe/dress with assistance ___ Walk Independently    ___ Shower independently _x STROKE/TIA DISCHARGE INSTRUCTIONS SMOKING Cigarette smoking nearly doubles your risk of having a stroke & is the single most alterable risk factor  If you smoke or have smoked in the last 12 months, you are advised to quit smoking for your health.  Most of the excess cardiovascular risk related to smoking disappears within a year of stopping.  Ask you doctor about anti-smoking medications  [place] Quit Line: 1-800-QUIT NOW  Free Smoking Cessation Classes [phone]  CHOLESTEROL Know your levels; limit fat & cholesterol in your diet  Lipid Panel     Component Value Date/Time   CHOL 156 09/25/2014 0400   TRIG 117 09/25/2014 0400   HDL 31* 09/25/2014 0400   CHOLHDL 5.0 09/25/2014 0400   VLDL 23 09/25/2014 0400   LDLCALC 102* 09/25/2014 0400      Many patients benefit from treatment even if their cholesterol is at goal.  Goal: Total Cholesterol (CHOL) less than 160  Goal:  Triglycerides (TRIG) less than 150  Goal:  HDL greater than 40  Goal:  LDL (LDLCALC) less than 100   BLOOD PRESSURE American Stroke Association blood pressure target is less that 120/80 mm/Hg  Your discharge blood pressure is:  BP: 119/67 mmHg  Monitor your blood pressure  Limit your salt and alcohol intake  Many individuals will require more than one medication for high blood pressure  DIABETES (A1c is a blood sugar average for last 3  months) Goal HGBA1c is under 7% (HBGA1c is blood sugar average for last 3 months)  Diabetes: No known diagnosis of diabetes    Lab Results  Component Value Date   HGBA1C 5.9* 09/25/2014     Your HGBA1c can be lowered with medications, healthy diet, and exercise.  Check your blood sugar as directed by your physician  Call your physician if you experience unexplained or low blood sugars.  PHYSICAL ACTIVITY/REHABILITATION Goal is 30 minutes at least 4 days per week  Activity: Increase activity slowly, Therapies: Physical Therapy: Home Health Return to work:   Activity decreases your risk of heart attack and stroke and makes your heart stronger.  It helps control your weight and blood pressure; helps you relax and can improve your mood.  Participate in a regular exercise program.  Talk with your doctor about the best form of exercise for you (dancing, walking, swimming, cycling).  DIET/WEIGHT Goal is to maintain a healthy weight  Your discharge diet is: Diet Heart Room service appropriate?: Yes; Fluid consistency:: Thin  liquids Your height is:  Height: 5\' 10"  (177.8 cm) Your current weight is: Weight: 89.3 kg (196 lb 13.9 oz) Your Body Mass Index (BMI) is:  BMI (Calculated): 28.8  Following the type of diet specifically designed for you will help prevent another stroke.  Your goal weight range is:    Your goal Body Mass Index (BMI) is 19-24.  Healthy food habits can help reduce 3 risk factors for stroke:  High cholesterol, hypertension, and excess weight.  RESOURCES  Stroke/Support Group:  Call [phone]   STROKE EDUCATION PROVIDED/REVIEWED AND GIVEN TO PATIENT Stroke warning signs and symptoms How to activate emergency medical system (call 911). Medications prescribed at discharge. Need for follow-up after discharge. Personal risk factors for stroke. Pneumonia vaccine given:  Flu vaccine given:  My questions have been answered, the writing is legible, and I understand  these instructions.  I will adhere to these goals & educational materials that have been provided to me after my discharge from the hospital.    __ Walk with assistance    ___ Shower with assistance ___ No alcohol     ___ Return to work/school ________  Special Instructions:     COMMUNITY REFERRALS UPON DISCHARGE:    Outpatient: PT, OT, SP  Agency:[facility] Phone:[phone]   Date of Last Service:10/09/2014  Appointment Date/Time:AUGUST 15 Monday 7;45-9;30 AM PT & OT  8/29-SPEECH 8;00-8;45 AM-WILL CONTACT IF CANCELLATION AND CAN GET IN SOONER  Medical Equipment/Items Ordered:NO NEEDS    GENERAL COMMUNITY RESOURCES FOR PATIENT/FAMILY: Support Groups:[facility] SUPPORT GROUP  My questions have been answered and I understand these instructions. I will adhere to these goals and the provided educational materials after my discharge from the hospital.  Patient/Caregiver Signature _______________________________ Date __________  Clinician Signature _______________________________________ Date __________  Please bring this form and your medication list with you to all your follow-up doctor's appointments.

## 2014-10-08 NOTE — Discharge Summary (Signed)
[name], [name]            ACCOUNT NO.:  [id number]  MEDICAL RECORD NO.:  [id number]  LOCATION:  4M04C                        FACILITY:  [facility]  PHYSICIAN:  Charlett Blake, M.D.DATE OF BIRTH:  [birth year]  DATE OF ADMISSION:  09/28/2014 DATE OF DISCHARGE:  10/09/2014                              DISCHARGE SUMMARY   DISCHARGE DIAGNOSES: 1. 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 2. Eliquis for atrial fibrillation. 3. Hypertension. 4. Hyperlipidemia. 5. Right ICA stenosis.  HISTORY OF PRESENT ILLNESS:  This is a 59 year old right-handed male with history of basal cell carcinoma, atrial fibrillation maintained on aspirin 81 mg daily, independent prior to admission, living with his wife, active.  Admitted on September 25, 2014, had undergone right ear tympanoplasty, noted post procedure to have altered mental status and difficulty communicating.  CT of the head showed acute moderate-sized left temporal parietal ischemic infarct with mild mass effect.  MRI of the brain showed left middle cerebral artery territory infarct.  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  The patient did not receive tPA.  Echocardiogram with ejection fraction of 65%, no wall motion abnormalities.  Carotid Dopplers with left ICA occlusion, right 40-59% ICA stenosis.  CT angiogram of the neck with left carotid occlusion, 65% right ICA stenosis.  Neurology consulted, maintained on Eliquis for CVA prophylaxis.  Vascular Surgery consulted, Dr. Bridgett Larsson, to follow up outpatient for consideration of right carotid surgery. Physical and occupational therapy ongoing.  The patient was admitted for comprehensive rehab program.  PAST MEDICAL HISTORY:  See discharge diagnoses.  SOCIAL HISTORY:  Independent prior to admission, living with his wife. The patient is still working  full-time.  Functional status upon admission to rehab services:  Mod to max assist to ambulate 150 feet, 2-person handheld assistance; minimal assist, sit to stand; min to mod assist for activities of daily living.  The patient noted to be quite impulsive.  PHYSICAL EXAMINATION:  VITAL SIGNS:  Blood pressure 126/82, pulse 67, temperature 98, respirations 20. GENERAL:  This was an alert male, noted nonfluent, expressive receptive aphasia with apraxia.  He would follow simple 1 demonstrating commands. LUNGS:  Clear to auscultation. CARDIAC:  Rate controlled. ABDOMEN:  Soft, nontender.  Good bowel sounds.  REHABILITATION HOSPITAL COURSE:  The patient was admitted to inpatient rehab services with therapies initiated on a 3-hour daily basis consisting of physical therapy, occupational therapy, speech therapy, and rehabilitation nursing.  The following issues were addressed during the patient's rehabilitation stay.  Pertaining to Mr. Cropper embolic left MCA infarct after right ear tympanoplasty, he would follow up with Neurology Services, continued on Eliquis for CVA prophylaxis in light of atrial fibrillation.  He would follow up with Dr. Melony Overly after recent right ear tympanoplasty with surgical site healing nicely. Blood pressures well controlled on low-dose Lopressor.  He did have 1 episode of orthostasis.  Followup chemistries unremarkable.  Chest x-ray clear.  EKG unremarkable.  He would follow up outpatient Vascular Surgery, Dr. Bridgett Larsson, for right ICA stenosis, questionable surgical intervention.  The patient received weekly collaborative  interdisciplinary team conferences to discuss estimated length of stay, family teaching, and any barriers to discharge.  He is ambulating greater than 300 feet standby assist, minimal cues for attention to the right side to avoid hitting any obstacles.  He was able to gather his belongings for activities of daily living and grooming was  supervision. He would ambulate to the bathroom supervision without the use of assistive device.  Speech Therapy followup for aphasia.  He improved on being able to identify matching of colors versus numbers.  Recognition, reading, and comprehension deficits noted.  Decreased task complexity by focusing only on matching numbers.  It was discussed with wife the need for ongoing assistance due to his aphasia and apraxia.  Full family teaching was completed and plan was discharge to home with ongoing therapies recommended.  DISCHARGE MEDICATIONS: 1. Eliquis 5 mg p.o. b.i.d. 2. Lipitor 40 mg p.o. daily. 3. Lopressor 12.5 mg p.o. daily. 4. Senokot-S 2 tablets daily as needed. 5. Ultram 100 mg every 6 hours as needed pain, dispense of 60 tablets.  DIET:  Regular consistency.  FOLLOWUP:  He would follow up with Dr. Alysia Penna on November 12, 2014; Dr. Melony Overly, followup 3 weeks; Dr. Erlinda Hong, Neurology Services 1 month call for appointment; Dr. Bridgett Larsson, Vascular Surgery, outpatient followup for right ICA stenosis, questionable surgical intervention.  The patient was advised no driving.     Lauraine Rinne, P.A.   ______________________________ Charlett Blake, M.D.    DA/MEDQ  D:  10/08/2014  T:  10/08/2014  Job:  [id number]  cc:   Rosalin Hawking, Dr. Priscille Heidelberg. Little, M.D. Leonides Sake. Lucia Gaskins, M.D.

## 2014-10-08 NOTE — Progress Notes (Signed)
Speech Language Pathology Discharge Summary  Patient Details  Name: [name] MRN: [id number] Date of Birth: 06-09-1955  Today's Date: 10/08/2014 SLP Individual Time: 8527-7824 SLP Individual Time Calculation (min): 70 min   Skilled Therapeutic Interventions:  Skilled treatment session focused on addressing cognitive-linguistic goals and wrap up of patient and family education. SLP facilitated session by providing Mod multimodal cues for basic comprehension of verbal directions from the patient's room to the speech office.  Patient matched a written word to the correct black and white picture from a field of 3-4 with Supervision cues.  During rote, automatic verbal tasks such as counting and saying the ABC's patient required Max cues for accurate initiation with cues faded to Min assist level.  Automatic verbal responses were more accurate in toady's session and patient benefited from articulatory cues.  Wife was present for session, observed and asked appropriate questions regarding follow-up, home management, and carryover therapy activities.  Questions were answered and she verbalized understanding.     Patient has met 3 of 3 long term goals.  Patient to discharge at overall Mod level.  Reasons goals not met: n/a   Clinical Impression/Discharge Summary:    Patient has made functional gains during this rehab admission and has met 3 of 3 long term goals due to improved functional communication abilities.  Patient progressed from Max assist to an overall Mod assist for basic cognitive-linguistic tasks due to Wernicke's Aphasia. Patient and family education has been completed and patient will discharge home with 24 hour supervision. Patient would benefit from follow up outpatient SLP services to continue to maximize independence with functional communication and reduce the overall burden of care.   Care Partner:  Caregiver Able to Provide Assistance: Yes  Type of Caregiver Assistance:  Cognitive (Linguistic)  Recommendation:  24 hour supervision/assistance;Outpatient SLP  Rationale for SLP Follow Up: Maximize functional communication;Maximize cognitive function and independence;Reduce caregiver burden   Equipment: none   Reasons for discharge: Treatment goals met;Discharged from hospital   Patient/Family Agrees with Progress Made and Goals Achieved: Yes   See FIM for current functional status  Carmelia Roller., CCC-SLP [phone]  [place] 10/08/2014, 1:56 PM

## 2014-10-08 NOTE — Progress Notes (Signed)
Occupational Therapy Discharge Summary  Patient Details  Name: FATEH KINDLE MRN: [id number] Date of Birth: [birth year]  Today's Date: 10/08/2014 OT Individual Time: 3086-5784 OT Individual Time Calculation (min): 55 min    Patient has met 9 of 9 long term goals due to improved activity tolerance, improved balance, ability to compensate for deficits, improved attention, improved awareness and improved coordination.  Patient to discharge at overall Supervision level.  Patient's care partner is independent to provide the necessary cognitive assistance at discharge.    Reasons goals not met: all goals met  Recommendation:  Patient will benefit from ongoing skilled OT services in outpatient setting to continue to advance functional skills in the area of iADL.  Equipment: OT recommended shower chair  Reasons for discharge: treatment goals met and discharge from hospital  Patient/family agrees with progress made and goals achieved: Yes   OT Intervention: Upon entering the room, pt seated in recliner chair with quick release belt donned and finishing breakfast. Pt eager to take shower this session. Pt ambulating without use of device and needing only supervision this session. Pt obtain all needed items for self care task and engaged in bathing at shower level with supervision. Pt performing sit <>stand in shower for safety with min verbal cues to sit when washing B feet. Pt returned to sit on edge of bed and donned all clothing with supervision this session. Pt ambulated 200' and sat on edge of mat and performed 2 sets of 20 reps of sit <>stand with use of B LEs only. Pt required rest break for fatigue. Pt then stopping at ADL apartment on way back to room and completed making bed without LOB and supervision only with min verbal questioning cues. Pt returning to room and sitting in recliner chair with quick release belt donned for safety. Call bell and all needed items within reach upon exiting  the room.   OT Discharge Precautions/Restrictions  Precautions Precautions: Fall Precaution Comments: pt with both expressive and receptive difficulties, expressive worse than receptive Restrictions Weight Bearing Restrictions: No Pain Pain Assessment Pain Assessment: No/denies pain Faces Pain Scale: Hurts a little bit Pain Type: Acute pain Pain Location: Head Pain Descriptors / Indicators: Aching;Headache Pain Onset: Gradual Pain Intervention(s): RN made aware Multiple Pain Sites: No Vision/Perception  Vision- History Baseline Vision/History: Wears glasses Wears Glasses: Reading only Vision- Assessment Vision Assessment?: Vision impaired- to be further tested in functional context  Cognition Overall Cognitive Status: Impaired/Different from baseline Arousal/Alertness: Awake/alert Orientation Level: Other (comment) (difficult to assess secondary to aphasia) Attention: Focused;Sustained Focused Attention: Appears intact Sustained Attention: Impaired Sustained Attention Impairment: Functional complex;Verbal complex Awareness: Impaired Problem Solving: Impaired Problem Solving Impairment: Functional basic;Functional complex Executive Function: Sequencing;Organizing Sequencing: Impaired Sequencing Impairment: Functional complex Organizing: Impaired Organizing Impairment: Functional complex Behaviors: Impulsive;Perseveration Safety/Judgment: Impaired Sensation Sensation Light Touch: Appears Intact Stereognosis: Not tested Hot/Cold: Not tested Proprioception: Impaired Detail Proprioception Impaired Details: Impaired RUE Additional Comments: slight ataxia and impaired end point destination with eyes closed finger to nose on R side Coordination Gross Motor Movements are Fluid and Coordinated: Yes Fine Motor Movements are Fluid and Coordinated: No Coordination and Movement Description: slight ataxia R UE Finger Nose Finger Test: impaired R side Heel Shin Test: slighly  less motor control on R side Motor  Motor Motor: Motor apraxia;Ataxia;Motor perseverations Mobility  Bed Mobility Bed Mobility: Rolling Left;Supine to Sit;Sit to Supine Rolling Left: 7: Independent Supine to Sit: 7: Independent Sit to Supine: 7: Independent Transfers Sit to Stand: 7: Independent;From  bed Stand to Sit: 7: Independent  Trunk/Postural Assessment  Cervical Assessment Cervical Assessment: Within Functional Limits Thoracic Assessment Thoracic Assessment: Within Functional Limits Lumbar Assessment Lumbar Assessment: Within Functional Limits Postural Control Postural Control: Within Functional Limits  Balance Balance Balance Assessed: Yes Static Sitting Balance Static Sitting - Balance Support: No upper extremity supported;Feet supported Static Sitting - Level of Assistance: 7: Independent Dynamic Sitting Balance Dynamic Sitting - Balance Support: During functional activity Dynamic Sitting - Level of Assistance: 7: Independent Static Standing Balance Static Standing - Balance Support: No upper extremity supported Static Standing - Level of Assistance: 7: Independent Dynamic Standing Balance Dynamic Standing - Balance Support: Right upper extremity supported;Left upper extremity supported;During functional activity Dynamic Standing - Level of Assistance: 6: Modified independent (Device/Increase time) Extremity/Trunk Assessment RUE Assessment RUE Assessment: Exceptions to [facility] RUE AROM (degrees) Overall AROM Right Upper Extremity: Within functional limits for tasks performed RUE Strength RUE Overall Strength: Within Functional Limits for tasks performed RUE Tone RUE Tone: Mild RUE Tone Comments: mild ataxia LUE Assessment LUE Assessment: Within Functional Limits  See FIM for current functional status  Phineas Semen 10/08/2014, 10:28 AM

## 2014-10-08 NOTE — Discharge Summary (Signed)
Discharge summary job # [id number]

## 2014-10-09 MED ORDER — METOPROLOL TARTRATE 25 MG PO TABS: 12.5000 mg | ORAL_TABLET | Freq: Every day | ORAL | Status: DC

## 2014-10-09 MED ORDER — TRAMADOL HCL 50 MG PO TABS: 100.0000 mg | ORAL_TABLET | Freq: Four times a day (QID) | ORAL | Status: DC | PRN

## 2014-10-09 MED ORDER — APIXABAN 5 MG PO TABS: 5.0000 mg | ORAL_TABLET | Freq: Two times a day (BID) | ORAL | Status: DC

## 2014-10-09 MED ORDER — SENNOSIDES-DOCUSATE SODIUM 8.6-50 MG PO TABS: 2.0000 | ORAL_TABLET | Freq: Two times a day (BID) | ORAL | Status: DC

## 2014-10-09 MED ORDER — ATORVASTATIN CALCIUM 40 MG PO TABS: 40.0000 mg | ORAL_TABLET | Freq: Every day | ORAL | Status: DC

## 2014-10-09 NOTE — Progress Notes (Signed)
Social Work Discharge Note Discharge Note  The overall goal for the admission was met for:   Discharge location: [place] 24 HR SUPERVISION  Length of Stay: Yes-10 DAYS  Discharge activity level: Yes-SUPERVISION LEVEL  Home/community participation: Yes  Services provided included: MD, RD, PT, OT, SLP, RN, CM, TR, Pharmacy, Neuropsych and SW  Financial Services: Private Insurance: [place]  Follow-up services arranged: Outpatient: [facility]-PT, OT,SP 8/15 7;45-9;30 AM 8/29 8;00-8;45  Comments (or additional information):SUE-WIFE HERE TO LEARN PT'S CARE CARE WILL HAVE SOMEONE WITH HIM 24 HR A DAY. OP WILL TRY TO MOVE UP SP APPOINTMENT WHEN THERE ON Monday 8/15. ENCOURAGED WIFE TO BEGIN SSD PROCESS-GAVE INFORMATION FOR ON-LINE APPLICATION  Patient/Family verbalized understanding of follow-up arrangements: Yes  Individual responsible for coordination of the follow-up plan: SUE-WIFE  Confirmed correct DME delivered: Elease Hashimoto 10/09/2014    Elease Hashimoto

## 2014-10-09 NOTE — Progress Notes (Signed)
Pt discharged to home with wife. Discharge instructions given to wife and pt per [place], [place]

## 2014-10-09 NOTE — Progress Notes (Signed)
[name] PHYSICAL MEDICINE & REHABILITATION     PROGRESS NOTE    Subjective/Complaints: Remains severely aphasic , aware of d/c "she should be here soon"  Otherwise no major complaints--ROS very limited by his aphasia  Objective: Vital Signs: Blood pressure 122/76, pulse 83, temperature 98.5 F (36.9 C), temperature source Oral, resp. rate 16, height 5\' 10"  (1.778 m), weight 85.503 kg (188 lb 8 oz), SpO2 100 %. No results found. No results for input(s): WBC, HGB, HCT, PLT in the last 72 hours. No results for input(s): NA, K, CL, GLUCOSE, BUN, CREATININE, CALCIUM in the last 72 hours.  Invalid input(s): CO CBG (last 3)  No results for input(s): GLUCAP in the last 72 hours.  Wt Readings from Last 3 Encounters:  10/07/14 85.503 kg (188 lb 8 oz)  09/26/14 98.8 kg (217 lb 13 oz)  05/28/14 93.441 kg (206 lb)    Physical Exam:  Constitutional: He appears well-developed.  HENT: dentition fair to good, oral mucosa pink and moist Head: Normocephalic.  Eyes: EOM are normal.  Neck: Normal range of motion. Neck supple. No thyromegaly present.  Cardiovascular:  Cardiac rate controlled without murmur Respiratory: Effort normal and breath sounds normal. No respiratory distress. No wheezes or rales GI: Soft. Bowel sounds are normal. He exhibits no distension.  Neurological: He is alert. partial right [place] Patient with expressive > receptive aphasia with apraxia---perseverative.  MMT: Right--- deltoid, biceps, triceps, wrist, HI, HF, KE, ADF, APF 4 to 4+/5 with decreased [place]. LUE and LLE 5/5 prox to distal. Sensory intact to  dtr's 2+.  Motor apraxia evident as well  Skin: Skin is warm and dry Psychiatric: He has a normal mood and affect. His behavior is normal   Assessment/Plan: 1. Functional deficits secondary to embolic left MCA infarct  Stable for D/C today F/u PCP in 1-2 weeks F/u PM&R 3 weeks See D/C summary See D/C instructionsFIM: FIM - Bathing Bathing Steps Patient  Completed: Chest, Right Arm, Left Arm, Abdomen, Front perineal area, Right upper leg, Left upper leg, Buttocks, Right lower leg (including foot), Left lower leg (including foot) Bathing: 5: Supervision: Safety issues/verbal cues  FIM - Upper Body Dressing/Undressing Upper body dressing/undressing steps patient completed: Thread/unthread right sleeve of pullover shirt/dresss, Thread/unthread left sleeve of pullover shirt/dress, Put head through opening of pull over shirt/dress, Pull shirt over trunk Upper body dressing/undressing: 5: Supervision: Safety issues/verbal cues FIM - Lower Body Dressing/Undressing Lower body dressing/undressing steps patient completed: Thread/unthread right underwear leg, Thread/unthread left underwear leg, Pull underwear up/down, Thread/unthread right pants leg, Thread/unthread left pants leg, Pull pants up/down, Don/Doff right shoe, Don/Doff left shoe, Fasten/unfasten right shoe, Fasten/unfasten left shoe, Don/Doff right sock, Don/Doff left sock Lower body dressing/undressing: 5: Supervision: Safety issues/verbal cues  FIM - Toileting Toileting steps completed by patient: Adjust clothing prior to toileting, Performs perineal hygiene, Adjust clothing after toileting Toileting Assistive Devices: Grab bar or rail for support Toileting: 5: Supervision: Safety issues/verbal cues  FIM - Radio producer Devices:  (Pt standing with steady assist to urinate) Toilet Transfers: 5-To toilet/BSC: Supervision (verbal cues/safety issues), 5-From toilet/BSC: Supervision (verbal cues/safety issues)  FIM - Control and instrumentation engineer Devices: Arm rests Bed/Chair Transfer: 5: Bed > Chair or W/C: Supervision (verbal cues/safety issues), 5: Chair or W/C > Bed: Supervision (verbal cues/safety issues), 7: Supine > Sit: No assist, 7: Sit > Supine: No assist  FIM - Locomotion: Wheelchair Locomotion: Wheelchair: 0: Activity did not occur (pt  is ambulatory) FIM -  Locomotion: Ambulation Locomotion: Ambulation Assistive Devices: Other (comment) (none) Ambulation/Gait Assistance: 5: Supervision Locomotion: Ambulation: 5: Travels 150 ft or more with supervision/safety issues  Comprehension Comprehension Mode: Auditory Comprehension: 3-Understands basic 50 - 74% of the time/requires cueing 25 - 50%  of the time  Expression Expression Mode: Verbal Expression: 3-Expresses basic 50 - 74% of the time/requires cueing 25 - 50% of the time. Needs to repeat parts of sentences.  Social Interaction Social Interaction: 4-Interacts appropriately 75 - 89% of the time - Needs redirection for appropriate language or to initiate interaction.  Problem Solving Problem Solving: 3-Solves basic 50 - 74% of the time/requires cueing 25 - 49% of the time  Memory Memory: 4-Recognizes or recalls 75 - 89% of the time/requires cueing 10 - 24% of the time  Medical Problem List and Plan: 1. Functional deficits secondary to embolic left MCA infarct (?ICA vs cardiac etiology) with expressive receptive aphasia after right ear tympanoplasty 09/24/2014   2. DVT Prophylaxis/Anticoagulation: Eliquis required due to afib. No current bleeding issues 3. Pain Management: Tylenol as needed. No pain at present 4. Hypertension/atrial fibrillation. Lopressor 12.5 mg twice a day.HR  at 83 Borderline orthostasis, will reduce to QD 5. Neuropsych: This patient is not capable of making decisions on his own behalf. 6. Skin/Wound Care: oob, nutrition 7. Fluids/Electrolytes/Nutrition:10-90% meals recorded intake 8. Hyperlipidemia. Lipitor for secondary stroke prevention 9. Right ICA stenosis. To be followed-up outpatient with vascular surgery. Left ICA occluded  LOS (Days) 10 A FACE TO FACE EVALUATION WAS PERFORMED  [name] 10/09/2014 7:34 AM

## 2014-10-12 ENCOUNTER — Ambulatory Visit
Payer: BLUE CROSS/BLUE SHIELD | Attending: Physical Medicine & Rehabilitation | Admitting: Rehabilitative and Restorative Service Providers"

## 2014-10-12 ENCOUNTER — Encounter: Payer: Self-pay | Admitting: Occupational Therapy

## 2014-10-12 ENCOUNTER — Ambulatory Visit: Payer: BLUE CROSS/BLUE SHIELD | Admitting: Occupational Therapy

## 2014-10-12 ENCOUNTER — Encounter: Payer: Self-pay | Admitting: Rehabilitative and Restorative Service Providers"

## 2014-10-12 DIAGNOSIS — H539 Unspecified visual disturbance: Secondary | ICD-10-CM | POA: Diagnosis present

## 2014-10-12 DIAGNOSIS — R279 Unspecified lack of coordination: Secondary | ICD-10-CM | POA: Insufficient documentation

## 2014-10-12 DIAGNOSIS — I69319 Unspecified symptoms and signs involving cognitive functions following cerebral infarction: Secondary | ICD-10-CM

## 2014-10-12 DIAGNOSIS — I6980 Unspecified sequelae of other cerebrovascular disease: Secondary | ICD-10-CM | POA: Diagnosis present

## 2014-10-12 DIAGNOSIS — R278 Other lack of coordination: Secondary | ICD-10-CM | POA: Diagnosis present

## 2014-10-12 DIAGNOSIS — IMO0002 Reserved for concepts with insufficient information to code with codable children: Secondary | ICD-10-CM

## 2014-10-12 DIAGNOSIS — R269 Unspecified abnormalities of gait and mobility: Secondary | ICD-10-CM | POA: Insufficient documentation

## 2014-10-12 DIAGNOSIS — I6931 Cognitive deficits following cerebral infarction: Secondary | ICD-10-CM | POA: Diagnosis present

## 2014-10-12 NOTE — Therapy (Signed)
[facility] [address] [place], [place], [address] Phone: [phone]   Fax:  [phone]  Occupational Therapy Evaluation  Patient Details  Name: [name] MRN: [id number] Date of Birth: 1955/06/15 Referring Provider:  Charlett Blake, MD  Encounter Date: 10/12/2014      OT End of Session - 10/12/14 0916    Visit Number 1   Number of Visits 17   Date for OT Re-Evaluation 12/11/14   Authorization Type BCBS with 30 visit limit OT/PT   OT Start Time 0805   OT Stop Time 0849   OT Time Calculation (min) 44 min   Activity Tolerance Patient tolerated treatment well   Behavior During Therapy --  agitated at times due to frustration with aphasia      Past Medical History  Diagnosis Date  . Atrial fibrillation   . Basal cell carcinoma   . Nephrolithiasis     Past Surgical History  Procedure Laterality Date  . Lithotripsy    . Vasectomy      There were no vitals filed for this visit.  Visit Diagnosis:  Lack of coordination due to stroke  Visual disturbance  Cognitive deficits following cerebral infarction      Subjective Assessment - 10/12/14 0811    Subjective  "I don't know what I'm supposed to say"   Patient is accompained by: Family member  wife   Pertinent History ischemic L MCA stroke during eardrum repair 09/24/14   Patient Stated Goals improve speech and get back to previous activities per wife   Currently in Pain? --  complains about RLE, but unable to report due to aphasia, ? numbness   Pain Frequency --  OT will not address due to location, but will monitor prn as relates to therapy           [facility] OT Assessment - 10/12/14 0001    Assessment   Diagnosis ischemic L MCA stroke   Onset Date 09/24/14   Prior Therapy CIR for 8 days   Precautions   Precautions Other (comment)  driving, supervision due to global aphasia   Balance Screen   Has the patient fallen in the past  6 months No   Home  Environment   Family/patient expects to be discharged to: Private residence   Living Arrangements Spouse/significant other   Lives With Spouse   Prior Function   Level of Independence Independent with basic ADLs;Independent with household mobility without device;Independent with community mobility without device   Vocation Full time Doctor, hospital of [employer]    Leisure played tennis   ADL   Eating/Feeding Independent   Grooming Independent   Upper Body Bathing Independent   Lower Body Bathing Independent   Upper Body Dressing Set up   Lower Body Dressing Set up   Acupuncturist -  Development worker, community Supervision/safety   IADL   Prior Level of Function Shopping independent   Shopping Needs to be accompanied on any shopping trip   Prior Level of Function Light Housekeeping did most prior   Light Housekeeping Performs light daily tasks such as dishwashing, bed making   Prior Level of Function Meal Prep grilling   Meal Prep --  able to warm items in microwave   Prior Level of Function Community Mobility independent   Community Mobility Relies on family or friends for transportation  Prior Level of Function Meal Prep independent   Medication Management Is not capable of dispensing or managing own medication   Prior Level of Function Financial Management wife performed   Mobility   Mobility Status Independent   Written Expression   Dominant Hand Right   Handwriting 100% legible   Written Experience --  aphasia limits, able to copy name, 2-digit # in 2/2 trials   Vision - History   Baseline Vision --  wears glasses for driving and reading, but wearing more now   Additional Comments wearing "driving" glasses more now   [place] impaired  _ to be further tested in functional context  unable due to aphasia    Comment Per wife, bumping into items on R side infrequently (mostly when bending over), missing items or R infrequently.  Max difficulty/cues for letter cancellation, but aphasia is contributing to difficutly.  Pt able to match written # on remote with 3/5 attempts.   Activity Tolerance   Activity Tolerance --  tired after community activities   Cognition   Overall Cognitive Status Cognition to be further assessed in functional context PRN  followed simple commands with gestures/demo/repetition   Difficult to assess due to --  global aphasia limits   Behaviors Impulsive  at times, frustration with apasia   Sensation   Light Touch --  ?RLE as pt reports that it's "not right"   Additional Comments --  unable to assess due to global aphasia   Coordination   9 Hole Peg Test Right;Left   Right 9 Hole Peg Test 68.91sec  with L hand assisting (despite cues) and drops   Left 9 Hole Peg Test 37.03sec, time affected by aphasia/ability to understand directions causing delay in removing pegs   ROM / Strength   AROM / PROM / Strength AROM;Strength   AROM   Overall AROM  Within functional limits for tasks performed   Strength   Overall Strength Within functional limits for tasks performed   Hand Function   Right Hand Grip (lbs) 72   Left Hand Grip (lbs) 53       Wife assisted in providing info due to global aphasia.                    OT Education - 10/12/14 1137    Education provided Yes   Education Details aphasia may limit OT progress and that frequency may need to be adjusted prn due to visit limits/aphasia   Person(s) Educated Spouse;Patient   Methods Explanation   Comprehension Verbalized understanding          OT Short Term Goals - 10/12/14 1129    OT SHORT TERM GOAL #1   Title Pt/caregiver will be independent with coordination HEP.--check STGs 11/12/14   Time 4   Period Weeks   Status New   OT SHORT TERM GOAL #2   Title Pt will perform simple tabletop  visual scanning with at least 90% accuracy.   Time 4   Period Weeks   Status New   OT SHORT TERM GOAL #3   Title Pt improve coordination for ADLs as shown by improving time on 9-hole peg test by at least 10sec with RUE.   Baseline 68.91sec   Time 4   Period Weeks   Status New   OT SHORT TERM GOAL #4   Title Pt/caregiver will verbalize understanding of CVA risk factors and warning signs.   Time 4  Period Weeks   Status New           OT Long Term Goals - 10/12/14 1132    OT LONG TERM GOAL #1   Title Pt/caregiver will verbalize understandiing of cognitive/adaptive strategies for ADLs prn.--check LTGs 12/11/14   Time 8   Period Weeks   Status New   OT LONG TERM GOAL #2   Title Pt will perform environmental scanning with at least 90% accuracy for incr safety.   Time 8   Period Weeks   Status New   OT LONG TERM GOAL #3   Title Pt improve coordination for ADLs as shown by improving time on 9-hole peg test by at least 20sec with RUE.   Baseline 68.91sec   Time 8   Period Weeks   Status New   OT LONG TERM GOAL #4   Title Pt will perform simple snack/meal prep/grilling with supervision.   Time 8   Period Weeks   Status New               Plan - 10/12/14 8315    Clinical Impression Statement Pt s/p ischemic L MCA stroke following eardrum repair 09/24/14.  Pt presents with significant global aphasia impacting ADLs.  Pt also demo decreased coordination of R hand, decr activity tolerance, visual deficits, and cognitive deficits affecting safety, RUE functional use, and ADLs and IADLs.  Pt would benefit from occupational therapy to address these deficits and compensate for deficits to incr participation in ADLs/IADLs.   Pt will benefit from skilled therapeutic intervention in order to improve on the following deficits (Retired) Impaired vision/preception;Decreased cognition;Impaired UE functional use;Decreased safety awareness;Impaired sensation;Decreased coordination;Decreased  activity tolerance   Rehab Potential Good   Clinical Impairments Affecting Rehab Potential global aphasia   OT Frequency 2x / week   OT Duration 8 weeks   OT Treatment/Interventions Self-care/ADL training;Visual/perceptual remediation/compensation;Cognitive remediation/compensation;Neuromuscular education;Cryotherapy;Therapeutic exercise;Therapeutic activities;Moist Heat;Fluidtherapy;DME and/or AE instruction;Patient/family education   Plan CVA education, coordination HEP   Recommended Other Services scheduled for speech therapy eval   Consulted and Agree with Plan of Care Patient;Family member/caregiver   Family Member Consulted wife        Problem List Patient Active Problem List   Diagnosis Date Noted  . 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 Left middle cerebral artery stroke 09/29/2014  . Acute ischemic left MCA stroke   . ICAO (internal carotid artery occlusion)   . Carotid stenosis   . CVA (cerebral infarction) 09/25/2014  . Stroke 09/25/2014  . PAF (paroxysmal atrial fibrillation) 09/25/2014  . Acute CVA (cerebrovascular accident)   . SOB (shortness of breath) 05/19/2014  . Heart palpitations 05/19/2014  . Atrial fibrillation     [facility] 10/12/2014, 11:38 AM  [facility] [address] [place], [place], [address] Phone: [phone]   Fax:  [place], OTR/L 10/12/2014 11:38 AM

## 2014-10-12 NOTE — Therapy (Signed)
[facility] [address] [place], [place], [address] Phone: [phone]   Fax:  [phone]  Physical Therapy Evaluation  Patient Details  Name: [name] MRN: [id number] Date of Birth: 1956-02-05 Referring Provider:  Charlett Blake, MD  Encounter Date: 10/12/2014      PT End of Session - 10/12/14 1130    Visit Number 1   Number of Visits 12   Date for PT Re-Evaluation 12/12/14   Authorization Type BCBS 30 visit combined PT/OT limit   PT Start Time 0850   PT Stop Time 0935   PT Time Calculation (min) 45 min   Equipment Utilized During Treatment Gait belt   Activity Tolerance Patient tolerated treatment well   Behavior During Therapy [facility] for tasks assessed/performed      Past Medical History  Diagnosis Date  . Atrial fibrillation   . Basal cell carcinoma   . Nephrolithiasis     Past Surgical History  Procedure Laterality Date  . Lithotripsy    . Vasectomy      There were no vitals filed for this visit.  Visit Diagnosis:  Abnormality of gait  Abnormal coordination      Subjective Assessment - 10/12/14 0843    Subjective The patient presents to outpatient physical therapy s/p L MCA on 09/25/2014 with admission to IP rehab and d/c home on 10/09/2014.    Patient is accompained by: Family member  spouse   Pertinent History Global aphasia   Patient Stated Goals vision during mobility, R leg pain.  Aphasia limits patient contribution to goals.   Currently in Pain? Yes  patient c/o R LE, but hard to distinguish secondary to aphasia.  Wife reports he c/o pain Saturday after being up for awhile.   Pain Score --  can get severe, but unable to rate due to aphasia.   Pain Location Leg   Pain Orientation Right   Pain Descriptors / Indicators Spasm;Tightness   Pain Type Acute pain   Pain Onset 1 to 4 weeks ago   Pain Frequency Intermittent   Aggravating Factors  cannot differentiate due to  aphasia            [facility] PT Assessment - 10/12/14 0826    Assessment   Medical Diagnosis L MCA   Onset Date/Surgical Date 09/25/14   Prior Therapy Acute and IP rehab   Precautions   Precautions Other (comment)   Precaution Comments global aphasia   Restrictions   Weight Bearing Restrictions No   Home Environment   Living Environment Private residence   Living Arrangements Spouse/significant other   Type of [place] to enter   Entrance Stairs-Number of Steps 5   Entrance Stairs-Rails Can reach both   [place] Two level   Alternate Level Stairs-Rails Left   Prior Function   Level of Independence Independent   Vocation Full time Doctor, hospital of business operations [employer]   Observation/Other Assessments   Focus on Therapeutic Outcomes (FOTO)  --  not performed due to aphasia inability to answer   Sensation   Light Touch Impaired Detail   Light Touch Impaired Details Impaired RLE   ROM / Strength   AROM / PROM / Strength AROM;Strength   AROM   Overall AROM  Within functional limits for tasks performed   Strength   Overall Strength Within functional limits for tasks performed  mild R LE hip flexion weakness 4+/5.  5/5 other LE muscles.   Transfers   Transfers Sit to Stand   Sit to Stand 7: Independent   Ambulation/Gait   Ambulation/Gait Yes   Ambulation/Gait Assistance 5: Supervision   Ambulation Distance (Feet) 300 Feet   Gait Pattern --  R hand abducts, R hip IR/foot IR, narrow base of support   Ambulation Surface Level   Gait velocity 3.27 ft/sec   Stairs Yes   Stairs Assistance 5: Supervision   Stair Management Technique Alternating pattern;Two rails   Standardized Balance Assessment   Standardized Balance Assessment Berg Balance Test;Dynamic Gait Index  did components of DGI with min A horiz head turns, limited by balance   Berg Balance Test   Sit to Stand Able to stand without using hands and stabilize  independently   Standing Unsupported Able to stand safely 2 minutes   Sitting with Back Unsupported but Feet Supported on Floor or Stool Able to sit safely and securely 2 minutes   Stand to Sit Sits safely with minimal use of hands   Transfers Able to transfer safely, minor use of hands   Standing Unsupported with Eyes Closed Able to stand 10 seconds safely   Standing Ubsupported with Feet Together Able to place feet together independently and stand 1 minute safely   From Standing, Reach Forward with Outstretched Arm Can reach confidently >25 cm (10")   From Standing Position, Pick up Object from Floor Able to pick up shoe safely and easily   From Standing Position, Turn to Look Behind Over each Shoulder Looks behind from both sides and weight shifts well   Turn 360 Degrees Able to turn 360 degrees safely but slowly   Standing Unsupported, Alternately Place Feet on Step/Stool Able to complete 4 steps without aid or supervision   Standing Unsupported, One Foot in Front Able to plae foot ahead of the other independently and hold 30 seconds   Standing on One Leg Tries to lift leg/unable to hold 3 seconds but remains standing independently   Total Score 48/56           PT Short Term Goals - 10/12/14 1132    PT SHORT TERM GOAL #1   Title The patient will return demo HEP with wife's assistance.   Baseline Target date 11/12/2014   Time 4   Period Weeks   PT SHORT TERM GOAL #2   Title The patient will negotiate level and unlevel community surfaces without a device independently x 1200 ft.   Baseline Target date 11/12/2014   Time 4   Period Weeks   PT SHORT TERM GOAL #3   Title The patient will negotiate 4 steps x 4 reps withut handrails independently.   Baseline Target date 11/12/2014   Time 4   Period Weeks   PT SHORT TERM GOAL #4   Title The patient will ambulate with horiozntal head turns for visual scanning x 50 feet without loss of balance indep.   Baseline Target date 11/12/2014    Time 4   Period Weeks           PT Long Term Goals - 10/12/14 1134    PT LONG TERM GOAL #1   Title The patient will be indep with progression of HEP for post d/c.   Baseline Target date 12/12/2014   Time 8   Period Weeks   PT LONG TERM GOAL #2   Title The patient will improve Berg from 48/56 to > or equal to 52/56.   Baseline  Target date 12/12/2014   Time 8   Period Weeks   PT LONG TERM GOAL #3   Title The patient will improve gait speed from 3.27 ft/sec to > or equal to 3.8 ft/sec for improved community mobility.   Baseline Target date 12/12/2014   Time 8   Period Weeks   PT LONG TERM GOAL #4   Title The patient will be able to walk while dribbling a basketball x 100 feet without loss of balance to demo improved coordination (pt participated in tennis and occasional basketball prior to CVA).   Baseline Target date 12/12/2014   Time 8   Period Weeks               Plan - 10/12/14 1136    Clinical Impression Statement The patient is a 59 yo male presenting to OP rehab with dyscoordination R LE, either sensory change or pain (unable to differentiate due to global aphasia), decreased high level balance, and decreased general mobility.     Pt will benefit from skilled therapeutic intervention in order to improve on the following deficits Abnormal gait;Decreased balance;Decreased coordination;Decreased mobility;Decreased strength   Rehab Potential Good   Clinical Impairments Affecting Rehab Potential global aphasia; pt able to follow demonstration after 2-3 tries   PT Frequency 2x / week   PT Duration 8 weeks  may reduce to 1x/week after first 4 weeks depending on progress   PT Treatment/Interventions Stair training;Functional mobility training;Patient/family education;ADLs/Self Care Home Management;Therapeutic activities;Therapeutic exercise;Neuromuscular re-education;Gait training   PT Next Visit Plan HEP: R leg stance activities near counter, tandem in corner, alternating  LE activities for coordination  (global aphasia and visual field cut- cannot assess fully due to aphasia)   Consulted and Agree with Plan of Care Patient;Family member/caregiver   Family Member Consulted spouse         Problem List Patient Active Problem List   Diagnosis Date Noted  . 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 Left middle cerebral artery stroke 09/29/2014  . Acute ischemic left MCA stroke   . ICAO (internal carotid artery occlusion)   . Carotid stenosis   . CVA (cerebral infarction) 09/25/2014  . Stroke 09/25/2014  . PAF (paroxysmal atrial fibrillation) 09/25/2014  . Acute CVA (cerebrovascular accident)   . SOB (shortness of breath) 05/19/2014  . Heart palpitations 05/19/2014  . Atrial fibrillation      Thank you for the referral of this patient. Rudell Cobb, MPT  [place], [place] 10/12/2014, 11:39 AM  [facility] [address] [place], [place], [address] Phone: [phone]   Fax:  [phone]

## 2014-10-13 ENCOUNTER — Telehealth: Payer: Self-pay | Admitting: Physical Medicine & Rehabilitation

## 2014-10-14 NOTE — Telephone Encounter (Signed)
  OK With me on not sure how to go about this. I don't think we could place in order through Epic. May be [facility] can fax [place] a referral form

## 2014-10-14 NOTE — Telephone Encounter (Signed)
[name]'s wife called again about getting his outpt therapy switched to [facility] where he can get into ST more quickly.  He is having increased pain in his legs as well at night and not sure if he needs to be evaluated. (It looks like he has an appt scheduled with outpt neuro ST on 10/26/14 and follow up with you 11/12/14)

## 2014-10-15 ENCOUNTER — Encounter: Payer: Self-pay | Admitting: Physical Medicine & Rehabilitation

## 2014-10-16 ENCOUNTER — Other Ambulatory Visit: Payer: Self-pay | Admitting: Family Medicine

## 2014-10-16 ENCOUNTER — Ambulatory Visit
Admission: RE | Admit: 2014-10-16 | Discharge: 2014-10-16 | Disposition: A | Discharge status: Home / Self Care | Payer: BLUE CROSS/BLUE SHIELD | Source: Ambulatory Visit | Attending: Family Medicine | Admitting: Family Medicine

## 2014-10-16 DIAGNOSIS — M79604 Pain in right leg: Secondary | ICD-10-CM

## 2014-10-23 ENCOUNTER — Encounter: Payer: Self-pay | Admitting: Physical Medicine & Rehabilitation

## 2014-10-23 ENCOUNTER — Encounter: Payer: BLUE CROSS/BLUE SHIELD | Attending: Physical Medicine & Rehabilitation

## 2014-10-23 ENCOUNTER — Ambulatory Visit (HOSPITAL_BASED_OUTPATIENT_CLINIC_OR_DEPARTMENT_OTHER): Payer: BLUE CROSS/BLUE SHIELD | Admitting: Physical Medicine & Rehabilitation

## 2014-10-23 VITALS — BP 120/72 | HR 64 | Resp 14

## 2014-10-23 DIAGNOSIS — I69351 Hemiplegia and hemiparesis following cerebral infarction affecting right dominant side: Secondary | ICD-10-CM | POA: Insufficient documentation

## 2014-10-23 DIAGNOSIS — Z85828 Personal history of other malignant neoplasm of skin: Secondary | ICD-10-CM | POA: Insufficient documentation

## 2014-10-23 DIAGNOSIS — I669 Occlusion and stenosis of unspecified cerebral artery: Secondary | ICD-10-CM | POA: Diagnosis not present

## 2014-10-23 DIAGNOSIS — M79604 Pain in right leg: Secondary | ICD-10-CM | POA: Diagnosis not present

## 2014-10-23 DIAGNOSIS — IMO0002 Reserved for concepts with insufficient information to code with codable children: Secondary | ICD-10-CM

## 2014-10-23 DIAGNOSIS — I69320 Aphasia following cerebral infarction: Secondary | ICD-10-CM | POA: Insufficient documentation

## 2014-10-23 DIAGNOSIS — G890 Central pain syndrome: Secondary | ICD-10-CM | POA: Diagnosis not present

## 2014-10-23 DIAGNOSIS — I4891 Unspecified atrial fibrillation: Secondary | ICD-10-CM | POA: Diagnosis not present

## 2014-10-23 DIAGNOSIS — I69390 Apraxia following cerebral infarction: Secondary | ICD-10-CM | POA: Diagnosis not present

## 2014-10-23 DIAGNOSIS — I6602 Occlusion and stenosis of left middle cerebral artery: Secondary | ICD-10-CM

## 2014-10-23 HISTORY — DX: Central pain syndrome: G89.0

## 2014-10-23 MED ORDER — TRAMADOL HCL 50 MG PO TABS: 100.0000 mg | ORAL_TABLET | Freq: Four times a day (QID) | ORAL | Status: DC | PRN

## 2014-10-23 MED ORDER — GABAPENTIN 300 MG PO CAPS: 300.0000 mg | ORAL_CAPSULE | Freq: Three times a day (TID) | ORAL | Status: DC

## 2014-10-23 NOTE — Progress Notes (Signed)
Subjective:    Patient ID: [name], male    DOB: 05-19-1955, 59 y.o.   MRN: [id number] 59 year old right-handed male with history of basal cell carcinoma, atrial fibrillation maintained on aspirin 81 mg daily, independent prior to admission, living with his wife, active.  Admitted on September 25, 2014, had undergone right ear tympanoplasty, noted post procedure to have altered mental status and difficulty communicating.  CT of the head showed acute moderate-sized left temporal parietal ischemic infarct with mild mass effect.  MRI of the brain showed left middle cerebral artery territory infarct.  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 Inpatient rehabilitation at [facility]: 09/28/2014 DATE OF DISCHARGE: 10/09/2014 HPI Starting the day after discharge from inpatient rehabilitation, Patient developed right lower extremity pain. He is aphasic both receptive and expressive therefore it is difficult for him to communicate to his wife and caregivers. Patient was evaluated by primary care physician for potential radiculopathy. X-rays of the lumbar spine were essentially normal. I reviewed the myself and there was some evidence of sacroiliac DJD however no loss of disc space was noted. Patient was placed on a prednisone Dosepak. According to the patient's wife this has not been helpful. His pain is worse during the night. He gets some relief with tramadol.  No falls no trauma. Prior history of back surgeries the patient does have numbness and tingling on the right side in the upper and lower extremity however the right upper extremity is not painful  Pain Inventory Average Pain 9 Pain Right Now 9 My pain is constant and aching  In the last 24 hours, has pain interfered with the following? General activity 7 Relation with others 5 Enjoyment of life 7 What TIME of day is your pain at its worst? constant Sleep (in general) Poor  Pain is worse  with: walking, bending, standing and some activites Pain improves with: rest and medication Relief from Meds: 7  Mobility walk without assistance how many minutes can you walk? 20-30 ability to climb steps?  yes do you drive?  no  Function employed # of hrs/week . disabled: date disabled .  Neuro/Psych weakness numbness  Prior Studies hospital follow up  Physicians involved in your care hospital follow up   Family History  Problem Relation Age of Onset  . Cancer Mother   . Heart failure Father    Social History   Social History  . Marital Status: Married    Spouse Name: N/A  . Number of Children: N/A  . Years of Education: N/A   Social History Main Topics  . Smoking status: Never Smoker   . Smokeless tobacco: None  . Alcohol Use: Yes     Comment: socially on the weekend with a few beers  . Drug Use: No  . Sexual Activity: Not Asked   Other Topics Concern  . None   Social History Narrative   Past Surgical History  Procedure Laterality Date  . Lithotripsy    . Vasectomy     Past Medical History  Diagnosis Date  . Atrial fibrillation   . Basal cell carcinoma   . Nephrolithiasis    BP 120/72 mmHg  Pulse 64  Resp 14  SpO2 97%  Opioid Risk Score:   Fall Risk Score:  `1  Depression screen PHQ 2/9  Depression screen PHQ 2/9 10/23/2014  Decreased Interest 0  Down, Depressed, Hopeless 0  PHQ - 2 Score 0  Altered sleeping 3  Tired, decreased energy 3  Change in appetite 0  Feeling bad or failure about yourself  0  Trouble concentrating 0  Moving slowly or fidgety/restless 0  Suicidal thoughts 0  PHQ-9 Score 6  Difficult doing work/chores Not difficult at all     Review of Systems  Neurological: Positive for weakness and numbness.  All other systems reviewed and are negative.      Objective:   Physical Exam  Constitutional: He is oriented to person, place, and time. He appears well-developed and well-nourished.  HENT:  Head:  Normocephalic and atraumatic.  Eyes: Conjunctivae and EOM are normal. Pupils are equal, round, and reactive to light.  Neck: Normal range of motion. Neck supple.  Cardiovascular: Normal rate, regular rhythm, normal heart sounds and intact distal pulses.   No murmur heard. Pulmonary/Chest: Effort normal and breath sounds normal.  Abdominal: Soft. Bowel sounds are normal.  Musculoskeletal:       Right shoulder: Normal.       Left shoulder: Normal.       Right elbow: Normal.      Left elbow: Normal.       Right wrist: Normal.       Left wrist: Normal.       Right hip: He exhibits decreased range of motion and decreased strength.       Left hip: Normal.       Right knee: Normal.       Left knee: Normal.       Right ankle: He exhibits decreased range of motion. No tenderness.       Left ankle: Normal.       Lumbar back: He exhibits normal range of motion, no tenderness, no bony tenderness, no edema, no deformity, no pain and no spasm.  Neurological: He is alert and oriented to person, place, and time. He has normal reflexes. A sensory deficit is present. He exhibits abnormal muscle tone. Gait abnormal.  Sensation is hyperesthetic to pinprick in the right lower extremity anterior and posterior thigh anterior and posterior calf nondermatomal. There is paresthesia to light touch in the right upper extremity but no pain. No hyperesthesia to pinprick. Left side has normal sensation no evidence of hyperesthesia. Negative straight leg raising test  Stifflegged gait, decreased ankle flexion during gait cycle  Increased extensor tone right lower extremity  Psychiatric: He has a normal mood and affect.  Nursing note and vitals reviewed.         Assessment & Plan:  1. 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 He has made good functional recovery his communication deficits are still severe. He does have hemiplegic  gait pattern. I recommend outpatient PT OT and speech.  2. Right lower extremity pain, no back pain. He has paresthesias in his right upper extremity as well but no pain. 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 Will order gabapentin 300 mg 3 times a day May need to titrate upward from there. Do not recommend any further imaging studies Extremity all 50 mg daily at bedtime when necessary #30 prescribed as well. This will likely be discontinued once therapeutic dose of Neurontin is established. Discontinue prednisone Return to clinic in one month

## 2014-10-23 NOTE — Patient Instructions (Addendum)
Even though the stroke was not in the thalamus, it was right next to the thalamus and I believe this pain is related to the stroke. Do not think any MRI of the spine is needed. Gabapentin should be helpful I will start at a medium dose but we may have to go higher. I will see back in one month to further evaluate.

## 2014-10-26 ENCOUNTER — Ambulatory Visit: Payer: BLUE CROSS/BLUE SHIELD

## 2014-10-28 ENCOUNTER — Telehealth: Payer: Self-pay | Admitting: *Deleted

## 2014-10-28 NOTE — Telephone Encounter (Signed)
Pt's wife called, says gabapentin dosage isn't helping with pt's pain, asking if it would be okay to increase dosage. Asking for a call back

## 2014-10-29 ENCOUNTER — Ambulatory Visit: Payer: BLUE CROSS/BLUE SHIELD

## 2014-10-29 ENCOUNTER — Inpatient Hospital Stay: Payer: BLUE CROSS/BLUE SHIELD | Admitting: Physical Medicine & Rehabilitation

## 2014-10-29 NOTE — Telephone Encounter (Signed)
I would recommend taking 2 tablets at night, 600 mg and none during the day

## 2014-10-29 NOTE — Telephone Encounter (Signed)
[name] called back.  The medication is also making him nauseas and tired. (see previous message)

## 2014-10-30 ENCOUNTER — Telehealth: Payer: Self-pay | Admitting: *Deleted

## 2014-10-30 MED ORDER — GABAPENTIN 300 MG PO CAPS: 600.0000 mg | ORAL_CAPSULE | Freq: Three times a day (TID) | ORAL | Status: DC

## 2014-10-30 NOTE — Telephone Encounter (Signed)
Notified per identified voicemail (per DPR)

## 2014-10-30 NOTE — Telephone Encounter (Signed)
Mrs Macphail called and says that she did not mean he was nauseaous and tired from gabapentin.  Nausea better and she thinks he is tired because he is in pain and the gabapentin is not helping that at 300 mg tid.

## 2014-10-30 NOTE — Telephone Encounter (Signed)
May increase gabapentin to 600 mg 3 times a day

## 2014-10-30 NOTE — Telephone Encounter (Signed)
Notified Mr Pundt. They will have pahrmacy to request refill if they need more medication.

## 2014-11-06 ENCOUNTER — Other Ambulatory Visit: Payer: Self-pay | Admitting: Physical Medicine & Rehabilitation

## 2014-11-10 ENCOUNTER — Telehealth: Payer: Self-pay | Admitting: *Deleted

## 2014-11-10 ENCOUNTER — Other Ambulatory Visit: Payer: Self-pay | Admitting: Physical Medicine & Rehabilitation

## 2014-11-10 NOTE — Telephone Encounter (Signed)
May take Tramadol 2 tablets 3 times per day as needed  Call in Additional tramadol 50 mg 2 by mouth 3 times a day #180

## 2014-11-10 NOTE — Telephone Encounter (Signed)
Wife called on behalf of patient.  Pt is having a lot of leg pain.  Tramadol is providing relief at night.  The gabapentin is not helping throughout the day.  She is wondering if there is anything else we can do

## 2014-11-11 MED ORDER — TRAMADOL HCL 50 MG PO TABS: 100.0000 mg | ORAL_TABLET | Freq: Three times a day (TID) | ORAL | Status: DC

## 2014-11-11 NOTE — Telephone Encounter (Signed)
Called pt, informed pt of Dr. Letta Pate recommended change. I called the pharmacy to place new order for Tramadol to accommodate change in pt's sig

## 2014-11-12 ENCOUNTER — Other Ambulatory Visit: Payer: Self-pay | Admitting: *Deleted

## 2014-11-12 ENCOUNTER — Inpatient Hospital Stay: Payer: BLUE CROSS/BLUE SHIELD | Admitting: Physical Medicine & Rehabilitation

## 2014-11-12 MED ORDER — GABAPENTIN 300 MG PO CAPS: 600.0000 mg | ORAL_CAPSULE | Freq: Three times a day (TID) | ORAL | Status: DC

## 2014-11-17 ENCOUNTER — Telehealth: Payer: Self-pay | Admitting: *Deleted

## 2014-11-17 NOTE — Telephone Encounter (Addendum)
Mrs [name] is calling back about [name]'s leg pain.  They have an appt 11/20/14 but still asking what can be done. This is a repeat call.(2 in one day)

## 2014-11-19 NOTE — Telephone Encounter (Signed)
I left a message per DPR on home phone stating that we have an opening last minute this morning at 9:45 or if he could be here tomorrow at 8:45 for early appt to let us know. I apologized that otherwise, I do not have a solution to offer her at this time.

## 2014-11-20 ENCOUNTER — Encounter: Payer: BLUE CROSS/BLUE SHIELD | Attending: Physical Medicine & Rehabilitation

## 2014-11-20 ENCOUNTER — Ambulatory Visit (HOSPITAL_BASED_OUTPATIENT_CLINIC_OR_DEPARTMENT_OTHER): Payer: BLUE CROSS/BLUE SHIELD | Admitting: Physical Medicine & Rehabilitation

## 2014-11-20 ENCOUNTER — Encounter: Payer: Self-pay | Admitting: Physical Medicine & Rehabilitation

## 2014-11-20 VITALS — BP 123/77 | HR 72 | Resp 14

## 2014-11-20 DIAGNOSIS — I4891 Unspecified atrial fibrillation: Secondary | ICD-10-CM | POA: Diagnosis not present

## 2014-11-20 DIAGNOSIS — I69390 Apraxia following cerebral infarction: Secondary | ICD-10-CM | POA: Insufficient documentation

## 2014-11-20 DIAGNOSIS — IMO0002 Reserved for concepts with insufficient information to code with codable children: Secondary | ICD-10-CM

## 2014-11-20 DIAGNOSIS — I69351 Hemiplegia and hemiparesis following cerebral infarction affecting right dominant side: Secondary | ICD-10-CM | POA: Diagnosis present

## 2014-11-20 DIAGNOSIS — M5416 Radiculopathy, lumbar region: Secondary | ICD-10-CM | POA: Diagnosis not present

## 2014-11-20 DIAGNOSIS — M79604 Pain in right leg: Secondary | ICD-10-CM | POA: Diagnosis not present

## 2014-11-20 DIAGNOSIS — I69320 Aphasia following cerebral infarction: Secondary | ICD-10-CM | POA: Insufficient documentation

## 2014-11-20 DIAGNOSIS — Z85828 Personal history of other malignant neoplasm of skin: Secondary | ICD-10-CM | POA: Insufficient documentation

## 2014-11-20 DIAGNOSIS — G890 Central pain syndrome: Secondary | ICD-10-CM

## 2014-11-20 DIAGNOSIS — R209 Unspecified disturbances of skin sensation: Secondary | ICD-10-CM | POA: Diagnosis not present

## 2014-11-20 MED ORDER — ACETAMINOPHEN-CODEINE #3 300-30 MG PO TABS
1.0000 | ORAL_TABLET | Freq: Three times a day (TID) | ORAL | Status: DC | PRN
Start: 2014-11-20 — End: 2014-11-24

## 2014-11-20 MED ORDER — PREGABALIN 75 MG PO CAPS: 75.0000 mg | ORAL_CAPSULE | Freq: Three times a day (TID) | ORAL | Status: DC

## 2014-11-20 NOTE — Progress Notes (Signed)
Subjective:    Patient ID: [name], male    DOB: [birth year], 59 y.o.   MRN: [id number] Admitted on September 25, 2014, had undergone right ear tympanoplasty, noted post procedure to have altered mental status and difficulty communicating.  CT of the head showed acute moderate-sized left temporal parietal ischemic infarct with mild mass effect.  MRI of the brain showed left middle cerebral artery territory infarct.  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 HPI Inpatient rehabilitation at [facility]: 09/28/2014 DATE OF DISCHARGE: 10/09/2014  Starting several days after discharge he developed right lower extremity pain. He was evaluated by urgent care. They were suspicious for radiculopathy given prednisone but this was not helpful. He was evaluated at this office approximately one month ago. Diagnosis of central post Stroke-pain syndrome Started on gabapentin 3 mg 3 times a day then increase to 600 mg 3 times a day. Also started on tramadol 50 mg 3 times a day and increase to 100 g 3 times a day. Both these medications have been of minimal help. He is continued with outpatient OT and speech therapy. Physical therapy felt like they had no further goals to achieve.  He remains severely aphasic and has difficulty describing his symptoms. It does not appear he has back pain but once again difficult to assess. He points to his right thigh and right leg and right foot. He does not point to his right hip. According to his wife who is with him today his pain seems to be all the time not just with activity. He points to his right arm and says it is not painful but feels different as compared with the left side.  No new bowel or bladder issues no new weakness in the leg Pain Inventory Average Pain 8 Pain Right Now 8 My pain is constant and aching  In the last 24 hours, has pain interfered with the following? General activity 8 Relation with others  4 Enjoyment of life 8 What TIME of day is your pain at its worst? constant Sleep (in general) Poor  Pain is worse with: walking Pain improves with: nothing Relief from Meds: 0  Mobility walk without assistance ability to climb steps?  yes do you drive?  no  Function disabled: date disabled .  Neuro/Psych numbness  Prior Studies Any changes since last visit?  no  Physicians involved in your care Any changes since last visit?  no   Family History  Problem Relation Age of Onset  . Cancer Mother   . Heart failure Father    Social History   Social History  . Marital Status: Married    Spouse Name: N/A  . Number of Children: N/A  . Years of Education: N/A   Social History Main Topics  . Smoking status: Never Smoker   . Smokeless tobacco: None  . Alcohol Use: Yes     Comment: socially on the weekend with a few beers  . Drug Use: No  . Sexual Activity: Not Asked   Other Topics Concern  . None   Social History Narrative   Past Surgical History  Procedure Laterality Date  . Lithotripsy    . Vasectomy     Past Medical History  Diagnosis Date  . Atrial fibrillation   . Basal cell carcinoma   . Nephrolithiasis    BP 123/77 mmHg  Pulse 72  Resp 14  SpO2 97%  Opioid Risk Score:   Fall Risk  Score:  `1  Depression screen PHQ 2/9  Depression screen PHQ 2/9 10/23/2014  Decreased Interest 0  Down, Depressed, Hopeless 0  PHQ - 2 Score 0  Altered sleeping 3  Tired, decreased energy 3  Change in appetite 0  Feeling bad or failure about yourself  0  Trouble concentrating 0  Moving slowly or fidgety/restless 0  Suicidal thoughts 0  PHQ-9 Score 6  Difficult doing work/chores Not difficult at all     Review of Systems  Neurological: Positive for numbness.  All other systems reviewed and are negative.      Objective:   Physical Exam  Constitutional: He appears well-developed and well-nourished.  HENT:  Head: Normocephalic and atraumatic.  Eyes:  Conjunctivae and EOM are normal. Pupils are equal, round, and reactive to light.  Musculoskeletal:       Right hip: Normal.       Right knee: Normal.       Right ankle: Normal.  Lumbar spine 75% forward flexion 50% extension no apparent pain with flexion and extension of lumbar spine. No tenderness to palpation  No pain with right hip knee or ankle range of motion no evidence of joint swelling  Neurological: He displays no atrophy. A sensory deficit is present. No cranial nerve deficit. He exhibits normal muscle tone. Coordination abnormal. Gait normal.  Cannot assess orientation due to severe aphasia patient has both expressive and receptive language deficits. Difficulty following some simple commands. Needs gestural cues  Sensation absent to pinprick and light touch in the right lower extremity reduced or at least different as compared with the left side in the right upper extremity  Motor strength is 4+/5 in the right deltoid, biceps, triceps, grip, 5/5 in the right hip flexor and extensor ankle dorsal flexor plantar flexor Left upper extremity left lower extreme to 5/5  Straight leg raising test difficult to interpret he does indicate some discomfort but he cannot indicate where it bothers him    Psychiatric:  Appears frustrated otherwise appropriate  Nursing note and vitals reviewed.         Assessment & Plan:  1. Right lower extremity pain in the setting of left posterior branch of MCA infarct with severe aphasia as well as sensory deficits on the right side in the upper and lower limb. I suspect he has a central posterior pain syndrome but it is somewhat in usual in that it is affecting the lower extremity greater than the upper extremity. He has not responded to prednisone he's had minimal response to gabapentin and tramadol.  Given that he has primarily lower extremity symptoms will order MRI of the lumbar spine to further assess and see if he has a concomitant lumbar  radiculopathy.  We'll switch to gabapentin to Lyrica 75 mg 3 times a day We'll switch tramadol to Tylenol No. 3 one by mouth 3 times a day  Wife will call on Monday to report on the effect of medication changes. May need to titrate upward.   We discussed sensitivity to medications after stroke that we have to be careful with sedating medications and ones that can predispose to falls  Would continue OT and speech therapy. Consider restarting physical therapy depending on MRI results.  We discussed that this is a difficult problem to treat and that efforts are hampered due to his difficulty with communication

## 2014-11-20 NOTE — Telephone Encounter (Signed)
Patient seen in clinic today by Dr. Letta Pate

## 2014-11-20 NOTE — Patient Instructions (Signed)
trying Lyrica in place of gabapentin Trying Tylenol 3 in place of tramadol.  Call if this change is not helpful by Monday or Tuesday  MRI of the lumbar spine to make sure we were not missing a pinched nerve that can cause similar symptoms

## 2014-11-23 ENCOUNTER — Telehealth: Payer: Self-pay | Admitting: Physical Medicine & Rehabilitation

## 2014-11-23 NOTE — Telephone Encounter (Signed)
Patient's wife, [name] called to let Dr. Letta Pate know that the new medication were not working.  Patient feeling more tired.

## 2014-11-24 NOTE — Telephone Encounter (Signed)
Please call Patient's wife and ask if she can bring him in Friday to see Dr. Letta Pate. Please and thank you

## 2014-11-24 NOTE — Telephone Encounter (Signed)
Stop Tylenol with codeine Restart tramadol 2 tablets 3 times per day

## 2014-11-24 NOTE — Telephone Encounter (Signed)
Mrs Turk called back and the medication prescribed last Friday is not working and is causing him to have headaches. Please advise

## 2014-11-24 NOTE — Telephone Encounter (Signed)
Message given to [name].  They will not need to come in Friday.

## 2014-11-25 ENCOUNTER — Telehealth: Payer: Self-pay | Admitting: Physical Medicine & Rehabilitation

## 2014-11-25 NOTE — Telephone Encounter (Signed)
Patient's sister-in-law has passed away and they would like to know if the patient is able to fly to accompany his wife to the funeral. Please call the wife back at [phone]

## 2014-11-27 ENCOUNTER — Ambulatory Visit: Payer: BLUE CROSS/BLUE SHIELD | Admitting: Physical Medicine & Rehabilitation

## 2014-11-28 ENCOUNTER — Ambulatory Visit (HOSPITAL_BASED_OUTPATIENT_CLINIC_OR_DEPARTMENT_OTHER)
Admission: RE | Admit: 2014-11-28 | Discharge: 2014-11-28 | Disposition: A | Discharge status: Home / Self Care | Payer: BLUE CROSS/BLUE SHIELD | Source: Ambulatory Visit | Attending: Physical Medicine & Rehabilitation | Admitting: Physical Medicine & Rehabilitation

## 2014-11-28 DIAGNOSIS — M79604 Pain in right leg: Secondary | ICD-10-CM | POA: Diagnosis not present

## 2014-11-28 DIAGNOSIS — M5416 Radiculopathy, lumbar region: Secondary | ICD-10-CM

## 2014-11-28 DIAGNOSIS — M545 Low back pain: Secondary | ICD-10-CM | POA: Diagnosis not present

## 2014-12-01 ENCOUNTER — Encounter: Payer: Self-pay | Admitting: Neurology

## 2014-12-01 ENCOUNTER — Ambulatory Visit (INDEPENDENT_AMBULATORY_CARE_PROVIDER_SITE_OTHER): Payer: BLUE CROSS/BLUE SHIELD | Admitting: Neurology

## 2014-12-01 VITALS — BP 101/66 | HR 60 | Ht 74.0 in | Wt 197.6 lb

## 2014-12-01 DIAGNOSIS — I480 Paroxysmal atrial fibrillation: Secondary | ICD-10-CM

## 2014-12-01 DIAGNOSIS — I63412 Cerebral infarction due to embolism of left middle cerebral artery: Secondary | ICD-10-CM

## 2014-12-01 DIAGNOSIS — I6522 Occlusion and stenosis of left carotid artery: Secondary | ICD-10-CM

## 2014-12-01 DIAGNOSIS — I6521 Occlusion and stenosis of right carotid artery: Secondary | ICD-10-CM | POA: Diagnosis not present

## 2014-12-01 NOTE — Patient Instructions (Signed)
-   continue eliquis and lipitor for stroke prevention - recommend second opinion from endovascular neurosurgery for right ICA narrowing - Follow up with your primary care physician for stroke risk factor modification. Recommend maintain blood pressure goal <130/80, diabetes with hemoglobin A1c goal below 6.5% and lipids with LDL cholesterol goal below 70 mg/dL.  - follow up with cardiology - check BP at home and goal is 120-150 - follow up in 3 months.

## 2014-12-02 ENCOUNTER — Telehealth: Payer: Self-pay | Admitting: Neurology

## 2014-12-02 DIAGNOSIS — I63412 Cerebral infarction due to embolism of left middle cerebral artery: Secondary | ICD-10-CM

## 2014-12-02 NOTE — Telephone Encounter (Signed)
[name]/wife [phone] called, husband saw Dr. Erlinda Hong yesterday. Dr Erlinda Hong recommended Vascular NeuroSurgeon. Vascular NeuroSurgeon Dr. Elliot Gault [phone] needs referral from Dr. Erlinda Hong in order to see this patient.

## 2014-12-02 NOTE — Telephone Encounter (Signed)
Lft vm for patients wife that the order for the referral for vascular neurosurgery will be enter by Dr. Erlinda Hong today. Rn explain on vm that our referral coordinator takes 24 to 48 to process the referral.

## 2014-12-02 NOTE — Progress Notes (Signed)
STROKE NEUROLOGY FOLLOW UP NOTE  NAME: [name] DOB: January 26, 1956  REASON FOR VISIT: stroke follow up HISTORY FROM: pt and wife and chart  Today we had the pleasure of seeing DOCTOR SHEAHAN in follow-up at our Neurology Clinic. Pt was accompanied by wife.   History Summary [name] is a 59 y.o. male with history of atrial fibrillation not on anticoagulation, basal cell carcinoma, and nephrolithiasis was admitted on 09/28/14 for prolonged confusion with difficulty communicating and following commands. 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 MRA showed left ICA occlusion, and CTA showed left ICA occlusion with right ICA 65% stenosis. Carotid Doppler showed right ICA 40-59% stenosis and left ICA occlusion. Echo unremarkable. LDL 102 and A1c 5.9. He was started on eliquis 5 mg bid and Lipitor 40 for stroke prevention. In terms of left ICA occlusion and right ICA 65% stenosis, vascular surgery was consulted, however, no intervention offered due to right ICA and less than 80% stenosis. He was discharged to home with outpatient speech therapy.  Interval History During the interval time, the patient has been doing well. Speech improved although still has partial global aphasia. Blood pressure in clinic 101/66 he is on Lopressor for rate control. I'll increase and Lipitor without side effects. Had MRI lumbar spine due to right lower extremity pain, which was unremarkable. No other complaints.  REVIEW OF SYSTEMS: Full 14 system review of systems performed and notable only for those listed below and in HPI above, all others are negative:  Constitutional:   Cardiovascular:  Ear/Nose/Throat:  Ringing years Skin:  Eyes:  Loss of vision Respiratory:   Gastroitestinal:   Genitourinary:  Hematology/Lymphatic:   Endocrine:  Musculoskeletal:  Leg pain Allergy/Immunology:   Neurological:  Memory loss, numbness, speech difficulty Psychiatric:  Sleep:   The following  represents the patient's updated allergies and side effects list: No Known Allergies  The neurologically relevant items on the patient's problem list were reviewed on today's visit.  Neurologic Examination  A problem focused neurological exam (12 or more points of the single system neurologic examination, vital signs counts as 1 point, cranial nerves count for 8 points) was performed.  Blood pressure 101/66, pulse 60, height 6\' 2"  (1.88 m), weight 197 lb 9.6 oz (89.631 kg).  General - Well nourished, well developed, in no apparent distress.  Ophthalmologic -  Fundi not visualized due to noncooperation.  Cardiovascular - irregularly irregular heart rate and rhythm.  Mental Status -  Level of arousal and orientation to time, place, and person were intact. Language exam showed partial expressive aphasia with speech hesitancy and paraphasic errors, able to follow some simple commands, but not complex commands, moderate perseverations. Difficulty with naming and repeating.   Cranial Nerves II - XII - II - Visual field intact OU. III, IV, VI - Extraocular movements intact. V - right facial sensation decreased. VII - right nasolabial fold flattening. VIII - Hearing & vestibular intact bilaterally X - Palate elevates symmetrically. XI - Chin turning & shoulder shrug intact bilaterally. XII - Tongue protrusion intact.  Motor Strength - The patient's strength was normal in all extremities and pronator drift was absent.  Bulk was normal and fasciculations were absent.   Motor Tone - Muscle tone was assessed at the neck and appendages and was normal.  Reflexes - The patient's reflexes were 1+ in all extremities and he had no pathological reflexes.  Sensory - Light touch, temperature/pinprick were assessed and were decreased on the  right.    Coordination - The patient had normal movements in the hands and feet with no ataxia or dysmetria.  Tremor was absent.  Gait and Station - The  patient's transfers, posture, gait, station, and turns were observed as normal.  Data reviewed: I personally reviewed the images and agree with the radiology interpretations.  Dg Chest 2 View  09/25/2014  Mild right basilar atelectasis. Interstitial thickening, this appears chronic.   Ct Head Wo Contrast 09/24/2014  Acute nonhemorrhagic infarction in the posterior left temporal lobe and left parietal lobe. Mild mass effect with sulcal effacement but no midline shift.   Ct Angio Neck W/cm &/or Wo/cm 09/25/2014  1. 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  2. The right internal carotid artery is narrowed to 1.9 mm. This represents the 65% stenosis relative to the more distal vessel at 5.5 mm.  3. The vertebral arteries are intact bilaterally.  4. Degenerative changes of the cervical spine are most evident at C5-6 with mild osseous foraminal narrowing bilaterally.   Mr Brain Wo Contrast 09/25/2014   MRI HEAD:  Acute large LEFT middle cerebral artery territory infarct without hemorrhagic conversion. Tubular susceptibility artifact LEFT cerebrum most compatible with thromboembolism distal LEFT MCA segment. Mild white matter changes compatible with chronic small vessel ischemic disease.   MRA HEAD:  LEFT internal carotid artery occlusion, recommend angiographic imaging in neck for further assessment. Slow flow within LEFT middle cerebral artery, retrograde filling via LEFT PCOM. Complete circle of Willis. Mild LEFT MCA luminal irregularity which suggests atherosclerosis.   CUS - Right: 40-59% ICA stenosis. Left: ICA is occluded. Bilateral: Vertebral artery flow is antegrade.   2D echo - - Left ventricle: The cavity size was normal. There was mild concentric hypertrophy. Systolic function was normal. The estimated ejection fraction was in the range of 60% to 65%. Wall motion was normal; there were no regional wall  motion abnormalities. Left ventricular diastolic function parameters were normal. Lateral annulus E velocity: 11.4 cm/s. Medial annulus E velocity: 9.68 cm/s. - Aorta: Aortic root at upper normal limits of size. Aortic root dimension: 39 mm (ED). - Mitral valve: There was mild regurgitation. - Systemic veins: IVC dilated with normal respiratory variation. Estimated CVP 8 mmHg.  MRI lumbar spine - No significant lumbar spine disc protrusion, foraminal stenosis or central canal stenosis.  Component     Latest Ref Rng 09/25/2014  Cholesterol     0 - 200 mg/dL 156  Triglycerides     <150 mg/dL 117  HDL Cholesterol     >40 mg/dL 31 (L)  Total CHOL/HDL Ratio      5.0  VLDL     0 - 40 mg/dL 23  LDL (calc)     0 - 99 mg/dL 102 (H)  Hemoglobin A1C     4.8 - 5.6 % 5.9 (H)  Mean Plasma Glucose      123    Assessment: As you may recall, he is a 59 y.o. Caucasian male with PMH of atrial fibrillation not on anticoagulation, basal cell carcinoma, and nephrolithiasis was admitted on 09/28/14 for large left MCA territory infarct. CTA showed left ICA occlusion with right ICA 65% stenosis. Carotid Doppler showed right ICA 40-59% stenosis and left ICA occlusion. LDL 102 and A1c 5.9. He was started on eliquis 5 mg bid and Lipitor 40 for stroke prevention. In terms of left ICA occlusion and right ICA 65% stenosis, vascular surgery was consulted, however, no intervention offered due  to right ICA and less than 80% stenosis. However, would like to seek for second opinion regarding right ICA intervention in the setting of left ICA occlusion. During the interval time, he is working with Electrical engineer for language improvement. BP management, avoid hypotension.  Plan:  - continue eliquis and lipitor for stroke prevention - recommend second opinion from endovascular neurosurgery for right ICA stenosis in the setting of left ICA occlusion - Follow up with your primary care physician for stroke  risk factor modification. Recommend maintain blood pressure goal around 130/80, diabetes with hemoglobin A1c goal below 6.5% and lipids with LDL cholesterol goal below 70 mg/dL.  - follow up with cardiology for A. fib - check BP at home and goal is 120-150 due to left ICA occlusion - RTC in 3 months.  I spent more than 25 minutes of face to face time with the patient. Greater than 50% of time was spent in counseling and coordination of care.    Orders Placed This Encounter  Procedures  . Ambulatory referral to Neurosurgery    Referral Priority:  Routine    Referral Type:  Surgical    Referral Reason:  Second Opinion    Referred to Provider:  Linus Mako. Redmond Pulling, MD    Requested Specialty:  Neurosurgery    Number of Visits Requested:  1    No orders of the defined types were placed in this encounter.    Patient Instructions  - continue eliquis and lipitor for stroke prevention - recommend second opinion from endovascular neurosurgery for right ICA narrowing - Follow up with your primary care physician for stroke risk factor modification. Recommend maintain blood pressure goal <130/80, diabetes with hemoglobin A1c goal below 6.5% and lipids with LDL cholesterol goal below 70 mg/dL.  - follow up with cardiology - check BP at home and goal is 120-150 - follow up in 3 months.   Rosalin Hawking, MD PhD [facility] [address] [place], [place] [address] [phone]

## 2014-12-02 NOTE — Telephone Encounter (Signed)
Order placed for second opinion of endovascular Neurosurgery for R ICA narrowing.

## 2014-12-03 ENCOUNTER — Encounter: Payer: Self-pay | Admitting: Cardiology

## 2014-12-07 NOTE — Telephone Encounter (Signed)
Wife called back to check status of referral for vascular neurosurgery. It has been over 48hrs and they haven't heard from anyone. Please call to advise [phone].

## 2014-12-07 NOTE — Telephone Encounter (Signed)
Rn call wife about referral. Rn stated the referral was already sent to the vascular neurosurgery. Wife states she already has the number for the referral.

## 2014-12-08 NOTE — Telephone Encounter (Signed)
Patients wife wants medication adjusted.  Spoke with wife and told her we would need to make an appointment to do that.  Patient already had an appointment scheduled for 12/11/14, but we changed it to  12/10/14 to get him in sooner.

## 2014-12-09 ENCOUNTER — Telehealth: Payer: Self-pay | Admitting: Physical Medicine & Rehabilitation

## 2014-12-09 ENCOUNTER — Other Ambulatory Visit: Payer: Self-pay | Admitting: *Deleted

## 2014-12-09 NOTE — Telephone Encounter (Signed)
Needs a refill on Eliquis and Lipitor

## 2014-12-09 NOTE — Telephone Encounter (Signed)
Contacted pts wife and left a message that we are not prescribing pts cardiac medication.  I advised that the pt should be getting his cardiac medications from his primary care or his cardiologist

## 2014-12-10 ENCOUNTER — Other Ambulatory Visit: Payer: Self-pay | Admitting: Physical Medicine & Rehabilitation

## 2014-12-10 ENCOUNTER — Ambulatory Visit (HOSPITAL_BASED_OUTPATIENT_CLINIC_OR_DEPARTMENT_OTHER): Payer: BLUE CROSS/BLUE SHIELD | Admitting: Physical Medicine & Rehabilitation

## 2014-12-10 ENCOUNTER — Encounter: Payer: Self-pay | Admitting: Physical Medicine & Rehabilitation

## 2014-12-10 ENCOUNTER — Encounter: Payer: BLUE CROSS/BLUE SHIELD | Attending: Physical Medicine & Rehabilitation

## 2014-12-10 VITALS — BP 109/73 | HR 85

## 2014-12-10 DIAGNOSIS — IMO0002 Reserved for concepts with insufficient information to code with codable children: Secondary | ICD-10-CM | POA: Insufficient documentation

## 2014-12-10 DIAGNOSIS — R208 Other disturbances of skin sensation: Secondary | ICD-10-CM | POA: Diagnosis not present

## 2014-12-10 DIAGNOSIS — I69398 Other sequelae of cerebral infarction: Secondary | ICD-10-CM | POA: Diagnosis not present

## 2014-12-10 DIAGNOSIS — I69320 Aphasia following cerebral infarction: Secondary | ICD-10-CM | POA: Diagnosis not present

## 2014-12-10 DIAGNOSIS — I4891 Unspecified atrial fibrillation: Secondary | ICD-10-CM | POA: Diagnosis not present

## 2014-12-10 DIAGNOSIS — I69390 Apraxia following cerebral infarction: Secondary | ICD-10-CM | POA: Diagnosis not present

## 2014-12-10 DIAGNOSIS — I69351 Hemiplegia and hemiparesis following cerebral infarction affecting right dominant side: Secondary | ICD-10-CM | POA: Insufficient documentation

## 2014-12-10 DIAGNOSIS — M79604 Pain in right leg: Secondary | ICD-10-CM | POA: Insufficient documentation

## 2014-12-10 DIAGNOSIS — G890 Central pain syndrome: Secondary | ICD-10-CM

## 2014-12-10 DIAGNOSIS — I6521 Occlusion and stenosis of right carotid artery: Secondary | ICD-10-CM | POA: Insufficient documentation

## 2014-12-10 DIAGNOSIS — Z85828 Personal history of other malignant neoplasm of skin: Secondary | ICD-10-CM | POA: Insufficient documentation

## 2014-12-10 DIAGNOSIS — I6522 Occlusion and stenosis of left carotid artery: Secondary | ICD-10-CM | POA: Insufficient documentation

## 2014-12-10 HISTORY — DX: Reserved for concepts with insufficient information to code with codable children: IMO0002

## 2014-12-10 MED ORDER — PREGABALIN 100 MG PO CAPS: 100.0000 mg | ORAL_CAPSULE | Freq: Three times a day (TID) | ORAL | Status: DC

## 2014-12-10 NOTE — Progress Notes (Signed)
Subjective:    Patient ID: [name], male    DOB: Mar 18, 1955, 59 y.o.   MRN: [id number]  Admitted on September 25, 2014, had undergone right ear tympanoplasty, noted post procedure to have altered mental status and difficulty communicating.  CT of the head showed acute moderate-sized left temporal parietal ischemic infarct with mild mass effect.  MRI of the brain showed left middle cerebral artery territory infarct.  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 HPI Inpatient rehabilitation at [place]: 09/28/2014 DATE OF DISCHARGE: 10/09/2014 HPI Starting a few days after he was discharged from the hospital, the patient developed increasing right lower extremity pain. He describes his pain as being numb but also painful. He was initially seen by urgent care and diagnosed with lumbar radiculitis. He was given a Medrol dose pack. This was not helpful for him. He came to physical medicine and rehabilitation clinic. 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 Was started on gabapentin and tramadol with only minimal relief. He was switched to Tylenol with codeine which she did not tolerate, he was also switched to Lyrica 75 mg 3 times a day which did not help very much. He did not notice any side effects from the Lyrica however. He continues to receive therapy both OT and speech. He does not receive any physical therapy because functionally his mobility is independent.  Because of persistent symptoms And the failure of usual conservative care, MRI of the lumbar spine was performed. This was reviewed with patient and the wife today. His lumbar MRI was essentially negative Pain Inventory Average Pain 8 Pain Right Now 8 My pain is aching  In the last 24 hours, has pain interfered with the following? General activity 7 Relation with others 5 Enjoyment of life 5 What TIME of day is your pain at its worst? constant Sleep (in general)  Fair  Pain is worse with: walking and standing Pain improves with: rest Relief from Meds: 1  Mobility walk without assistance how many minutes can you walk? 20 ability to climb steps?  yes do you drive?  no  Function disabled: date disabled 09/23/14  Neuro/Psych numbness  Prior Studies no  Physicians involved in your care no   Family History  Problem Relation Age of Onset  . Cancer Mother   . Heart failure Father    Social History   Social History  . Marital Status: Married    Spouse Name: N/A  . Number of Children: N/A  . Years of Education: N/A   Social History Main Topics  . Smoking status: Never Smoker   . Smokeless tobacco: None  . Alcohol Use: Yes     Comment: socially on the weekend with a few beers  . Drug Use: No  . Sexual Activity: Not Asked   Other Topics Concern  . None   Social History Narrative   Past Surgical History  Procedure Laterality Date  . Lithotripsy    . Vasectomy     Past Medical History  Diagnosis Date  . Atrial fibrillation ([place])   . Basal cell carcinoma   . Nephrolithiasis   . Stroke ([place])   . Neuropathy ([facility])    BP 109/73 mmHg  Pulse 85  SpO2 100%  Opioid Risk Score:   Fall Risk Score:  `1  Depression screen PHQ 2/9  Depression screen PHQ 2/9 10/23/2014  Decreased Interest 0  Down, Depressed, Hopeless 0  PHQ - 2 Score 0  Altered sleeping 3  Tired, decreased energy 3  Change in appetite 0  Feeling bad or failure about yourself  0  Trouble concentrating 0  Moving slowly or fidgety/restless 0  Suicidal thoughts 0  PHQ-9 Score 6  Difficult doing work/chores Not difficult at all     Review of Systems  All other systems reviewed and are negative.      Objective:   Physical Exam  Constitutional: He appears well-developed and well-nourished.  HENT:  Head: Normocephalic and atraumatic.  Left Ear: External ear normal.  Eyes: Conjunctivae and EOM are normal. Pupils are equal, round, and reactive to  light.  Neck: Normal range of motion. Neck supple.  Musculoskeletal:       Right shoulder: Normal.       Right elbow: Normal.      Right hip: Normal.       Right knee: Normal.       Right ankle: Normal.       Left hand: Decreased sensation noted. Decreased strength noted. He exhibits finger abduction, thumb/finger opposition and wrist extension trouble.       Right foot: Normal.  Neurological: He is alert. He displays no atrophy and no tremor. A sensory deficit is present. He exhibits normal muscle tone. Coordination abnormal.  Speech remains aphasic, He has receptive and expressive deficits. He has difficulty following commands. He has evidence of apraxia affecting the right side  Has reduced sensation on the right side compared to the left side. But is now able to sense light touch and distinguish which fingers touched on the right hand.  The left upper extremity has normal sensation left lower extremity is normal sensation  Right lower extremity has absent light touch sensation.  He has tenderness to light palpation in the right lower extremity but otherwise has no other lower extremity abnormalities on examination.  Nursing note and vitals reviewed.         Assessment & Plan:  1. 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 He does have severe sensory deficits in the right lower extremity but no motor deficits. We discussed the MRI results as essentially normal. No further workup needed. Discussed that this is a difficult problem to treat, no FDA indicated medication that is specific to this condition. We'll trial higher dose Lyrica, 100 mg 3 times a day. May need to work up to 200 mg 3 times a day if needed as long as he tolerates this. We'll continue tramadol 50-100 mg 3 times a day

## 2014-12-10 NOTE — Patient Instructions (Signed)
Arnica cream- should be ok  Capsaicin-may need to be careful with this  [facility] or [facility]- may need to be careful with this

## 2014-12-11 ENCOUNTER — Ambulatory Visit: Payer: BLUE CROSS/BLUE SHIELD | Admitting: Physical Medicine & Rehabilitation

## 2014-12-18 ENCOUNTER — Telehealth: Payer: Self-pay | Admitting: Neurology

## 2014-12-18 NOTE — Telephone Encounter (Signed)
Pt's wife called and wanted Dr. Erlinda Hong to be aware that a fax was sent to this office about his long term disability.  Please call and advise [phone] Manuela Schwartz

## 2014-12-18 NOTE — Telephone Encounter (Signed)
Sure. Adonis Huguenin will handle that, thanks.  Rosalin Hawking, MD PhD Stroke Neurology 12/18/2014 1:27 PM

## 2014-12-21 ENCOUNTER — Telehealth: Payer: Self-pay

## 2014-12-21 NOTE — Telephone Encounter (Signed)
Left voice message for Manuela Schwartz pts wife. Rn explain on vm that a release form needs to be sign by the patient.Also there is a 25.00 charge that [facility] charges to have the form filled out.

## 2014-12-21 NOTE — Telephone Encounter (Signed)
Left voice message for [name] pts wife. Rn explain on vm that a release form needs to be sign by the patient.Also there is a 25.00 charge that [facility] charges to have the form filled out.

## 2014-12-22 ENCOUNTER — Telehealth: Payer: Self-pay | Admitting: *Deleted

## 2014-12-22 DIAGNOSIS — Z0289 Encounter for other administrative examinations: Secondary | ICD-10-CM

## 2014-12-22 NOTE — Telephone Encounter (Signed)
Form,[employer] sent to Katrina and Dr Erlinda Hong 12/22/14.

## 2014-12-23 NOTE — Telephone Encounter (Signed)
Butch Penny in medical records has talk to wife about paying th 25.00 fee. Rn does have the release form, and for md to sign and filled out his part. Dr. Erlinda Hong is aware the form needs to be sign and filled out.

## 2014-12-28 ENCOUNTER — Encounter: Payer: Self-pay | Admitting: Occupational Therapy

## 2014-12-28 NOTE — Telephone Encounter (Signed)
If patients wife Manuela Schwartz calls back, she needs to pay the 25.00 fee for the disability form.Form cannot be fax till the fee is paid. We already have the release form done and sign.  Lft vm for patients wife about needing 25.00 payment to send form for pts disability.

## 2014-12-28 NOTE — Telephone Encounter (Signed)
Pt's wife called and states she has already paid the 25.00 she asked that you check with the billing department. May call [phone]

## 2014-12-28 NOTE — Therapy (Signed)
[facility] [address] [place], [place], [address] Phone: [phone]   Fax:  [phone]  Patient Details  Name: [name] MRN: [id number] Date of Birth: 1955/06/23 Referring Provider:  No ref. provider found  Encounter Date: 12/28/2014  OCCUPATIONAL THERAPY DISCHARGE SUMMARY  Visits from Start of Care: 1  Current functional level related to goals / functional outcome See eval.  Goals not met as pt did not return after eval   Remaining deficits: See eval as pt did not return after eval   Education / Equipment: Not completed as pt did not return to OT  Plan: Patient agrees to discharge.  Patient goals were not met. Patient is being discharged due to not returning since the last visit.  Pt seen for eval only.?????        [facility] 12/28/2014, 1:07 PM  [facility] [address] [place], [place], [address] Phone: [phone]   Fax:  [place], OTR/L 12/28/2014 1:07 PM

## 2014-12-29 ENCOUNTER — Telehealth: Payer: Self-pay | Admitting: Physical Medicine & Rehabilitation

## 2014-12-29 NOTE — Telephone Encounter (Signed)
Spoke to wife, instructed to increase Lyrica to 200 mg twice a day  Next prescription will be for Lyrica 225 mg twice a day #60, no refills, please phone in

## 2014-12-29 NOTE — Telephone Encounter (Signed)
Wife called to ask what to do because the pain medication is not helping the pain in his legs. Does she increase Lyrica? Call back number is [phone]

## 2014-12-29 NOTE — Telephone Encounter (Signed)
Rn verified with Angie in billing that 25.00 dollars was paid on 12-22-14 for disability form. Form fax to [employer] on 12-29-14 and it was confirm by fax.

## 2014-12-30 MED ORDER — PREGABALIN 225 MG PO CAPS: 225.0000 mg | ORAL_CAPSULE | Freq: Two times a day (BID) | ORAL | Status: DC

## 2014-12-30 NOTE — Telephone Encounter (Signed)
Called in RX to [facility] as listed below. Thanks

## 2015-01-07 ENCOUNTER — Encounter: Payer: BLUE CROSS/BLUE SHIELD | Attending: Physical Medicine & Rehabilitation

## 2015-01-07 ENCOUNTER — Encounter: Payer: Self-pay | Admitting: Physical Medicine & Rehabilitation

## 2015-01-07 ENCOUNTER — Ambulatory Visit (HOSPITAL_BASED_OUTPATIENT_CLINIC_OR_DEPARTMENT_OTHER): Payer: BLUE CROSS/BLUE SHIELD | Admitting: Physical Medicine & Rehabilitation

## 2015-01-07 VITALS — BP 124/72 | HR 64

## 2015-01-07 DIAGNOSIS — I69351 Hemiplegia and hemiparesis following cerebral infarction affecting right dominant side: Secondary | ICD-10-CM | POA: Insufficient documentation

## 2015-01-07 DIAGNOSIS — I69390 Apraxia following cerebral infarction: Secondary | ICD-10-CM | POA: Insufficient documentation

## 2015-01-07 DIAGNOSIS — I4891 Unspecified atrial fibrillation: Secondary | ICD-10-CM | POA: Insufficient documentation

## 2015-01-07 DIAGNOSIS — Z85828 Personal history of other malignant neoplasm of skin: Secondary | ICD-10-CM | POA: Diagnosis not present

## 2015-01-07 DIAGNOSIS — M79604 Pain in right leg: Secondary | ICD-10-CM | POA: Insufficient documentation

## 2015-01-07 DIAGNOSIS — I69320 Aphasia following cerebral infarction: Secondary | ICD-10-CM

## 2015-01-07 DIAGNOSIS — G890 Central pain syndrome: Secondary | ICD-10-CM | POA: Diagnosis not present

## 2015-01-07 DIAGNOSIS — IMO0002 Reserved for concepts with insufficient information to code with codable children: Secondary | ICD-10-CM

## 2015-01-07 MED ORDER — PREGABALIN 150 MG PO CAPS: 150.0000 mg | ORAL_CAPSULE | Freq: Three times a day (TID) | ORAL | Status: DC

## 2015-01-07 NOTE — Progress Notes (Signed)
Subjective:    Patient ID: [name], male    DOB: 08-10-1955, 59 y.o.   MRN: [id number]  HPI  59 year old male with history of left MCA distribution infarct resulting in aphasia as well as right-sided weakness. He completed inpatient rehabilitation and [facility] in August 2016. He completed home health as well as outpatient therapy. He has had right lower extremity pain since discharge from rehabilitation. Workup included MRI lumbar spine which is essentially negative. Lyrica dose increased to 225 mg twice a day has been taking for about 4 days however he missed one day. Patient has had some increased pain since he did not take his medicine. No side effects from the increased dosage as tiredness or lightheadedness or thinking problems.  Pain Inventory Average Pain 8 Pain Right Now 9 My pain is constant and aching  In the last 24 hours, has pain interfered with the following? General activity 6 Relation with others 4 Enjoyment of life 6 What TIME of day is your pain at its worst? NA Sleep (in general) Fair  Pain is worse with: walking and standing Pain improves with: NA Relief from Meds: 3  Mobility Do you have any goals in this area?  no  Function disabled: date disabled NA  Neuro/Psych numbness  Prior Studies Any changes since last visit?  no  Physicians involved in your care Any changes since last visit?  no   Family History  Problem Relation Age of Onset  . Cancer Mother   . Heart failure Father    Social History   Social History  . Marital Status: Married    Spouse Name: N/A  . Number of Children: N/A  . Years of Education: N/A   Social History Main Topics  . Smoking status: Never Smoker   . Smokeless tobacco: None  . Alcohol Use: Yes     Comment: socially on the weekend with a few beers  . Drug Use: No  . Sexual Activity: Not Asked   Other Topics Concern  . None   Social History Narrative   Past Surgical History    Procedure Laterality Date  . Lithotripsy    . Vasectomy     Past Medical History  Diagnosis Date  . Atrial fibrillation ([place])   . Basal cell carcinoma   . Nephrolithiasis   . Stroke ([place])   . Neuropathy ([facility])    BP 124/72 mmHg  Pulse 64  SpO2 97%  Opioid Risk Score:   Fall Risk Score:  `1  Depression screen PHQ 2/9  Depression screen PHQ 2/9 10/23/2014  Decreased Interest 0  Down, Depressed, Hopeless 0  PHQ - 2 Score 0  Altered sleeping 3  Tired, decreased energy 3  Change in appetite 0  Feeling bad or failure about yourself  0  Trouble concentrating 0  Moving slowly or fidgety/restless 0  Suicidal thoughts 0  PHQ-9 Score 6  Difficult doing work/chores Not difficult at all     Review of Systems  Neurological: Positive for numbness.  All other systems reviewed and are negative.      Objective:   Physical Exam  Constitutional: He is oriented to person, place, and time. He appears well-developed and well-nourished.  HENT:  Head: Normocephalic and atraumatic.  Eyes: Conjunctivae are normal. Pupils are equal, round, and reactive to light.  Neck: Normal range of motion.  Neurological: He is alert and oriented to person, place, and time.  Psychiatric: He has a normal mood and  affect.  Nursing note and vitals reviewed.  Motor strength is 4/5 in right upper cavity 5/5 in left upper shoulder me 5/5 bilateral lower extremities Patient without hypersensitivity to touch. He has good range of motion at the hip knee and ankle. Good strength in the right lower extremities. Skin has some small papular lesions with some surrounding erythema approximately 2-3 mm. No drainage.  Ambulates without evidence of toe drag or knee instability Lower extremity without swelling no pain with hip knee or ankle range of motion he has no evidence of knee or ankle joint swelling or effusion     Assessment & Plan:  1. 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 Recommend  continue Lyrica 225 mg twice a day.Once this runs out we'll trial 150 mg 3 times a day. May need to titrate upward from there if symptoms still unrelieved. No other work up needing at the current time  2. Skin lesions look like scabies, recommend follow-up with primary care physician.

## 2015-01-14 ENCOUNTER — Encounter: Payer: Self-pay | Admitting: Cardiology

## 2015-01-14 ENCOUNTER — Other Ambulatory Visit: Payer: Self-pay | Admitting: Physical Medicine & Rehabilitation

## 2015-01-14 ENCOUNTER — Ambulatory Visit (INDEPENDENT_AMBULATORY_CARE_PROVIDER_SITE_OTHER): Payer: BLUE CROSS/BLUE SHIELD | Admitting: Cardiology

## 2015-01-14 VITALS — BP 120/76 | HR 66 | Ht 74.0 in | Wt 201.4 lb

## 2015-01-14 DIAGNOSIS — I480 Paroxysmal atrial fibrillation: Secondary | ICD-10-CM | POA: Diagnosis not present

## 2015-01-14 DIAGNOSIS — R002 Palpitations: Secondary | ICD-10-CM | POA: Diagnosis not present

## 2015-01-14 DIAGNOSIS — I77810 Thoracic aortic ectasia: Secondary | ICD-10-CM

## 2015-01-14 DIAGNOSIS — R0602 Shortness of breath: Secondary | ICD-10-CM

## 2015-01-14 LAB — TSH: TSH: 3.178 u[IU]/mL (ref 0.350–4.500)

## 2015-01-14 MED ORDER — METOPROLOL TARTRATE 25 MG PO TABS: 12.5000 mg | ORAL_TABLET | Freq: Every day | ORAL | Status: DC

## 2015-01-14 NOTE — Progress Notes (Signed)
Cardiology Office Note   Date:  01/14/2015   ID:  HOWE CHARLESWORTH, DOB 12/17/1955, MRN [id number]  PCP:  Gennette Pac, MD    Chief Complaint  Patient presents with  . Atrial Fibrillation  . Shortness of Breath      History of Present Illness: [name] is a 59 y.o. male who presents for follwoup of SOB. He had a normal stres test in January of 2010. He has a history of atrial fibrillation after kidney surgery and was on blood thinners for a while and stopped this on his own.Marland Kitchen His CHADS2VASC score is 0. The last time I saw him he was complaining of several months of SOB with exertion playing tennis. 2D echo showed normal LVF with mildly dilated aortic root and nuclear stress test was normal. He was also having palpitations and an event monitor showed PAF.  He was started on Lopressor.   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  He is now on Apixaban.  He presents back today for followup.   He denies any chest pain, dizziness or LE edema.    Past Medical History  Diagnosis Date  . Atrial fibrillation ([place])   . Basal cell carcinoma   . Nephrolithiasis   . Stroke ([place])   . Neuropathy [facility])     Past Surgical History  Procedure Laterality Date  . Lithotripsy    . Vasectomy       Current Outpatient Prescriptions  Medication Sig Dispense Refill  . apixaban (ELIQUIS) 5 MG TABS tablet Take 1 tablet (5 mg total) by mouth 2 (two) times daily. 60 tablet 1  . atorvastatin (LIPITOR) 40 MG tablet Take 1 tablet (40 mg total) by mouth daily at 6 PM. 30 tablet 1  . metoprolol tartrate (LOPRESSOR) 25 MG tablet Take 0.5 tablets (12.5 mg total) by mouth daily. 30 tablet 1  . pregabalin (LYRICA) 150 MG capsule Take 1 capsule (150 mg total) by mouth 3 (three) times daily. 90 capsule 0  . senna-docusate (SENOKOT-S) 8.6-50 MG per tablet Take 2 tablets by mouth 2 (two) times daily.    . traMADol (ULTRAM) 50 MG tablet TAKE TWO TABLETS BY MOUTH  THREE TIMES DAILY AS NEEDED FOR PAIN 180 tablet 0   No current facility-administered medications for this visit.    Allergies:   Review of patient's allergies indicates no known allergies.    Social History:  The patient  reports that he has never smoked. He does not have any smokeless tobacco history on file. He reports that he drinks alcohol. He reports that he does not use illicit drugs.   Family History:  The patient's family history includes Cancer in his mother; Heart failure in his father.    ROS:  Please see the history of present illness.   Otherwise, review of systems are positive for none.   All other systems are reviewed and negative.    PHYSICAL EXAM: VS:  BP 120/76 mmHg  Pulse 66  Ht 6\' 2"  (1.88 m)  Wt 91.354 kg (201 lb 6.4 oz)  BMI 25.85 kg/m2  SpO2 99% , BMI Body mass index is 25.85 kg/(m^2). GEN: Well nourished, well developed, in no acute distress HEENT: normal Neck: no JVD, carotid bruits, or masses Cardiac: RRR; no murmurs, rubs, or gallops,no edema  Respiratory:  clear to auscultation bilaterally, normal work of breathing GI: soft, nontender,  nondistended, + BS MS: no deformity or atrophy Skin: warm and dry, no rash Neuro:  Strength and sensation are intact Psych: euthymic mood, full affect   EKG:  EKG is not ordered today.    Recent Labs: 05/26/2014: Pro B Natriuretic peptide (BNP) 60.0 09/30/2014: ALT 28 10/05/2014: BUN 16; Creatinine, Ser 1.22; Hemoglobin 15.9; Platelets 289; Potassium 4.4; Sodium 138    Lipid Panel    Component Value Date/Time   CHOL 156 09/25/2014 0400   TRIG 117 09/25/2014 0400   HDL 31* 09/25/2014 0400   CHOLHDL 5.0 09/25/2014 0400   VLDL 23 09/25/2014 0400   LDLCALC 102* 09/25/2014 0400      Wt Readings from Last 3 Encounters:  01/14/15 91.354 kg (201 lb 6.4 oz)  12/01/14 89.631 kg (197 lb 9.6 oz)  10/07/14 85.503 kg (188 lb 8 oz)     ASSESSMENT AND PLAN:  1. SOB with exertion mainly playing tennis with no  ischemia on stress test and normal LVF on echo.  Heart monitor did show some episodes of PAF.  The SOB has resolved.   2. PAF maintaining NSR on BB/Apixaban. His CHADS2VASC score had been 0 but now is 2 with recent CVA.Marland KitchenCheck TSH. 3. Palpitations with PAF on heart monitor.  Maintaining NSR.   Current medicines are reviewed at length with the patient today.  The patient does not have concerns regarding medicines.  The following changes have been made:  no change  Labs/ tests ordered today: See above Assessment and Plan No orders of the defined types were placed in this encounter.     Disposition:   FU with me in 6 months  Signed, Sueanne Margarita, MD  01/14/2015 11:16 AM    [facility] [address], [place], [place]  [address] Phone: [phone]; Fax: [phone]

## 2015-01-14 NOTE — Patient Instructions (Signed)
Medication Instructions:  Your physician recommends that you continue on your current medications as directed. Please refer to the Current Medication list given to you today.   Labwork: TODAY: TSH  Testing/Procedures: Your physician has requested that you have an echocardiogram in July, 2017. Echocardiography is a painless test that uses sound waves to create images of your heart. It provides your doctor with information about the size and shape of your heart and how well your heart's chambers and valves are working. This procedure takes approximately one hour. There are no restrictions for this procedure.  Follow-Up: Your physician wants you to follow-up in: 6 months with Dr. Radford Pax. You will receive a reminder letter in the mail two months in advance. If you don't receive a letter, please call our office to schedule the follow-up appointment.   Any Other Special Instructions Will Be Listed Below (If Applicable).     If you need a refill on your cardiac medications before your next appointment, please call your pharmacy.

## 2015-01-22 ENCOUNTER — Ambulatory Visit: Payer: BLUE CROSS/BLUE SHIELD | Admitting: Cardiology

## 2015-01-28 ENCOUNTER — Telehealth: Payer: Self-pay

## 2015-01-28 NOTE — Telephone Encounter (Signed)
Pt's wife called stating that the pt is still having pain in his right leg. Requesting a medication adjustment. Please advise. Thanks!

## 2015-01-28 NOTE — Telephone Encounter (Signed)
Tell the patient to increase Lyrica 150 mg to 2 tablets twice a day  If this is helpful we can call in a prescription with a higher dosage amount

## 2015-01-29 NOTE — Telephone Encounter (Signed)
Pt's wife, Manuela Schwartz, has been advised to increase Lyrica dose.

## 2015-02-03 ENCOUNTER — Other Ambulatory Visit: Payer: Self-pay | Admitting: Physical Medicine & Rehabilitation

## 2015-02-04 ENCOUNTER — Other Ambulatory Visit: Payer: Self-pay | Admitting: Physical Medicine & Rehabilitation

## 2015-02-25 ENCOUNTER — Telehealth: Payer: Self-pay | Admitting: Cardiology

## 2015-02-25 MED ORDER — APIXABAN 5 MG PO TABS: 5.0000 mg | ORAL_TABLET | Freq: Two times a day (BID) | ORAL | Status: DC

## 2015-02-25 MED ORDER — ATORVASTATIN CALCIUM 40 MG PO TABS: 40.0000 mg | ORAL_TABLET | Freq: Every day | ORAL | Status: DC

## 2015-02-25 NOTE — Telephone Encounter (Signed)
Patient's wife called to request Dr. Radford Pax prescribe Lipitor and Eliquis.  The patient was started in the hospital and the patient saw his PCP first, who refilled the medications as a courtesy because he was about to run out. Recently, Dr. Rex Kras instructed him to call cardiology to have filled. Rx sent.

## 2015-02-25 NOTE — Telephone Encounter (Signed)
New problem    Pt's wife was told to call back to speak to nurse to talk about Dr Radford Pax prescribing pt's medications Eliquis and Liptior and not his pcp anymore

## 2015-02-26 ENCOUNTER — Telehealth: Payer: Self-pay

## 2015-02-26 NOTE — Telephone Encounter (Signed)
Prior auth for Eliquis 5 mg obtained from [facility], but for 14 days only. I am not understanding this. Sent to pharmacy.

## 2015-03-05 ENCOUNTER — Encounter: Payer: BLUE CROSS/BLUE SHIELD | Attending: Physical Medicine & Rehabilitation

## 2015-03-05 ENCOUNTER — Encounter: Payer: Self-pay | Admitting: Physical Medicine & Rehabilitation

## 2015-03-05 ENCOUNTER — Ambulatory Visit (HOSPITAL_BASED_OUTPATIENT_CLINIC_OR_DEPARTMENT_OTHER): Payer: BLUE CROSS/BLUE SHIELD | Admitting: Physical Medicine & Rehabilitation

## 2015-03-05 VITALS — BP 129/71 | HR 110

## 2015-03-05 DIAGNOSIS — I69351 Hemiplegia and hemiparesis following cerebral infarction affecting right dominant side: Secondary | ICD-10-CM | POA: Diagnosis present

## 2015-03-05 DIAGNOSIS — I69320 Aphasia following cerebral infarction: Secondary | ICD-10-CM | POA: Insufficient documentation

## 2015-03-05 DIAGNOSIS — I4891 Unspecified atrial fibrillation: Secondary | ICD-10-CM | POA: Insufficient documentation

## 2015-03-05 DIAGNOSIS — G890 Central pain syndrome: Secondary | ICD-10-CM

## 2015-03-05 DIAGNOSIS — R208 Other disturbances of skin sensation: Secondary | ICD-10-CM | POA: Diagnosis not present

## 2015-03-05 DIAGNOSIS — I69390 Apraxia following cerebral infarction: Secondary | ICD-10-CM | POA: Insufficient documentation

## 2015-03-05 DIAGNOSIS — I69398 Other sequelae of cerebral infarction: Secondary | ICD-10-CM

## 2015-03-05 DIAGNOSIS — M79604 Pain in right leg: Secondary | ICD-10-CM | POA: Diagnosis not present

## 2015-03-05 DIAGNOSIS — IMO0002 Reserved for concepts with insufficient information to code with codable children: Secondary | ICD-10-CM

## 2015-03-05 DIAGNOSIS — Z85828 Personal history of other malignant neoplasm of skin: Secondary | ICD-10-CM | POA: Insufficient documentation

## 2015-03-05 MED ORDER — TRAMADOL HCL 50 MG PO TABS: 100.0000 mg | ORAL_TABLET | Freq: Three times a day (TID) | ORAL | Status: DC

## 2015-03-05 MED ORDER — PREGABALIN 150 MG PO CAPS: 300.0000 mg | ORAL_CAPSULE | Freq: Two times a day (BID) | ORAL | Status: DC

## 2015-03-05 MED ORDER — TIZANIDINE HCL 2 MG PO TABS: 4.0000 mg | ORAL_TABLET | Freq: Three times a day (TID) | ORAL | Status: DC | PRN

## 2015-03-05 NOTE — Progress Notes (Signed)
Subjective:    Patient ID: [name], male    DOB: [birth year], 60 y.o.   MRN: [id number] September 25, 2014, had undergone right ear tympanoplasty, noted post procedure to have altered mental status and difficulty communicating.  CT of the head showed acute moderate-sized left temporal parietal ischemic infarct with mild mass effect. HPI Patient has completed inpatient [name]. Patient has regained full independence with ADLs. He is independent with mobility. He has had persistent left lower extremity burning pain which encompasses the anterior and posterior thigh greater than the right calf and leg. He has decreased sensation in the right lower extremity greater than the right upper extremity since the stroke. MRI lumbar spine has demonstrated no evidence of significant stenosis to explain his symptoms Pain Inventory Average Pain 8 Pain Right Now 7 My pain is constant, dull and aching  In the last 24 hours, has pain interfered with the following? General activity 8 Relation with others 5 Enjoyment of life 8 What TIME of day is your pain at its worst? daytime Sleep (in general) NA  Pain is worse with: walking and standing Pain improves with: rest and heat/ice Relief from Meds: 2  Mobility walk without assistance ability to climb steps?  yes do you drive?  yes  Function disabled: date disabled 12/28/14  Neuro/Psych numbness  Prior Studies Any changes since last visit?  no  Physicians involved in your care Any changes since last visit?  no   Family History  Problem Relation Age of Onset  . Cancer Mother   . Heart failure Father    Social History   Social History  . Marital Status: Married    Spouse Name: N/A  . Number of Children: N/A  . Years of Education: N/A   Social History Main Topics  . Smoking status: Never Smoker   . Smokeless tobacco: None  . Alcohol Use: Yes     Comment: socially on the weekend with a few beers  . Drug  Use: No  . Sexual Activity: Not Asked   Other Topics Concern  . None   Social History Narrative   Past Surgical History  Procedure Laterality Date  . Lithotripsy    . Vasectomy     Past Medical History  Diagnosis Date  . Atrial fibrillation ([place])   . Basal cell carcinoma   . Nephrolithiasis   . Stroke ([place])   . Neuropathy ([facility])    BP 129/71 mmHg  Pulse 110  SpO2 97%  Opioid Risk Score:   Fall Risk Score:  `1  Depression screen PHQ 2/9  Depression screen PHQ 2/9 10/23/2014  Decreased Interest 0  Down, Depressed, Hopeless 0  PHQ - 2 Score 0  Altered sleeping 3  Tired, decreased energy 3  Change in appetite 0  Feeling bad or failure about yourself  0  Trouble concentrating 0  Moving slowly or fidgety/restless 0  Suicidal thoughts 0  PHQ-9 Score 6  Difficult doing work/chores Not difficult at all    Review of Systems  Respiratory:       Sinus infection on abx  All other systems reviewed and are negative.      Objective:   Physical Exam  Patient with fluent aphasia. He demonstrates some evidence of motor apraxia during manual muscle testing. Motor strength is 5/5 bilateral deltoids, biceps, triceps, grip, hip flexor, knee extensor, ankle dorsiflexor and plantar flexor Sensation is Absent to light touch in the right lower extremity,Reduced to light  touch right upper extremity Gait shows no evidence of toe drag or knee instability Mood and affect are without lability or agitation     Assessment & Plan:  1. Left posterior MCA distribution infarct with infarct location spanning the temporal parietal and occipital area. He has no evidence of field cut. His motor strength has improved but has persistent sensory deficits. His aphasia lingers but is slowly improving.  [place]  syndromeAffecting the right lower extremity. He is on tramadol 100 mg 3 times a day in combination with Lyrica a total of 600 mg per day. He would like to try an additional agent  we discussed the fact that there are no specific treatments for this condition. He is artery tried gabapentin he is re: type Tylenol with Codeine. Because of his high tramadol dose I would not recommend tricyclic antidepressants or Cymbalta. We will trial tizanidine given that this has some affect on neuropathic pain. If this fails we may consider baclofen which may be effective for the symptom of allodynia. We discussed the possibility of drowsiness.  Discussed this syndrome with patient and his wife. Over half of the 25 min visit was spent counseling and coordinating care.

## 2015-03-05 NOTE — Patient Instructions (Signed)
Tizanidine tablets or capsules What is this medicine? TIZANIDINE (tye ZAN i deen) helps to relieve muscle spasms. It may be used to help in the treatment of multiple sclerosis and spinal cord injury. This medicine may be used for other purposes; ask your health care provider or pharmacist if you have questions. What should I tell my health care provider before I take this medicine? They need to know if you have any of these conditions: -kidney disease -liver disease -low blood pressure -mental disorder -an unusual or allergic reaction to tizanidine, other medicines, lactose (tablets only), foods, dyes, or preservatives -pregnant or trying to get pregnant -breast-feeding How should I use this medicine? Take this medicine by mouth with a full glass of water. Take this medicine on an empty stomach, at least 30 minutes before or 2 hours after food. Do not take with food unless you talk with your doctor. Follow the directions on the prescription label. Take your medicine at regular intervals. Do not take your medicine more often than directed. Do not stop taking except on your doctor's advice. Suddenly stopping the medicine can be very dangerous. Talk to your pediatrician regarding the use of this medicine in children. Patients over 102 years old may have a stronger reaction and need a smaller dose. Overdosage: If you think you have taken too much of this medicine contact a poison control center or emergency room at once. NOTE: This medicine is only for you. Do not share this medicine with others. What if I miss a dose? If you miss a dose, take it as soon as you can. If it is almost time for your next dose, take only that dose. Do not take double or extra doses. What may interact with this medicine? Do not take this medicine with any of the following medications: -ciprofloxacin -clonidine -fluvoxamine -guanabenz -guanfacine -methyldopa This medicine may also interact with the following  medications: -acyclovir -alcohol -antihistamines -baclofen -barbiturates like phenobarbital -benzodiazepines -cimetidine -famotidine -male hormones, like estrogens or progestins and birth control pills -medicines for high blood pressure -medicines for irregular heartbeat -medicines for pain like codeine, morphine, and hydrocodone -medicines for sleep -rofecoxib -some antibiotics like levofloxacin, ofloxacin -ticlopidine -zileuton This list may not describe all possible interactions. Give your health care provider a list of all the medicines, herbs, non-prescription drugs, or dietary supplements you use. Also tell them if you smoke, drink alcohol, or use illegal drugs. Some items may interact with your medicine. What should I watch for while using this medicine? You may get drowsy or dizzy. Do not drive, use machinery, or do anything that needs mental alertness until you know how this medicine affects you. Do not stand or sit up quickly, especially if you are an older patient. This reduces the risk of dizzy or fainting spells. Alcohol may interfere with the effect of this medicine. Avoid alcoholic drinks. Your mouth may get dry. Chewing sugarless gum or sucking hard candy, and drinking plenty of water may help. Contact your doctor if the problem does not go away or is severe. What side effects may I notice from receiving this medicine? Side effects that you should report to your doctor or health care professional as soon as possible: -allergic reactions like skin rash, itching or hives, swelling of the face, lips, or tongue -blurred vision -fainting spells -hallucinations -nausea or vomiting -nervousness -redness, blistering, peeling or loosening of the skin, including inside the mouth -slow or irregular heartbeat, palpitations, or chest pain -yellowing of the skin or eyes  Side effects that usually do not require medical attention (report to your doctor or health care professional  if they continue or are bothersome): -dizziness -drowsiness -dry mouth -tiredness or weakness This list may not describe all possible side effects. Call your doctor for medical advice about side effects. You may report side effects to FDA at 1-800-FDA-1088. Where should I keep my medicine? Keep out of the reach of children. Store at room temperature between 15 and 30 degrees C (59 and 86 degrees F). Throw away any unused medicine after the expiration date. NOTE: This sheet is a summary. It may not cover all possible information. If you have questions about this medicine, talk to your doctor, pharmacist, or health care provider.    2016, [employer] Standard. (2007-10-31 12:38:02)

## 2015-03-12 ENCOUNTER — Ambulatory Visit (INDEPENDENT_AMBULATORY_CARE_PROVIDER_SITE_OTHER): Payer: BLUE CROSS/BLUE SHIELD | Admitting: Neurology

## 2015-03-12 ENCOUNTER — Encounter: Payer: Self-pay | Admitting: Neurology

## 2015-03-12 VITALS — BP 93/65 | HR 91 | Ht 74.0 in | Wt 206.4 lb

## 2015-03-12 DIAGNOSIS — G890 Central pain syndrome: Secondary | ICD-10-CM | POA: Diagnosis not present

## 2015-03-12 DIAGNOSIS — I6522 Occlusion and stenosis of left carotid artery: Secondary | ICD-10-CM | POA: Diagnosis not present

## 2015-03-12 DIAGNOSIS — I63412 Cerebral infarction due to embolism of left middle cerebral artery: Secondary | ICD-10-CM

## 2015-03-12 DIAGNOSIS — I480 Paroxysmal atrial fibrillation: Secondary | ICD-10-CM

## 2015-03-12 DIAGNOSIS — I6521 Occlusion and stenosis of right carotid artery: Secondary | ICD-10-CM

## 2015-03-12 MED ORDER — OXCARBAZEPINE 300 MG PO TABS: 300.0000 mg | ORAL_TABLET | Freq: Two times a day (BID) | ORAL | Status: DC

## 2015-03-12 NOTE — Progress Notes (Signed)
STROKE NEUROLOGY FOLLOW UP NOTE  NAME: [name] DOB: 06/16/1955  REASON FOR VISIT: stroke follow up HISTORY FROM: pt and wife and chart  Today we had the pleasure of seeing [name] in follow-up at our Neurology Clinic. Pt was accompanied by wife.   History Summary [name] is a 60 y.o. male with history of atrial fibrillation not on anticoagulation, basal cell carcinoma, and nephrolithiasis was admitted on 09/28/14 for prolonged confusion with difficulty communicating and following commands. 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 MRA showed left ICA occlusion, and CTA showed left ICA occlusion with right ICA 65% stenosis. Carotid Doppler showed right ICA 40-59% stenosis and left ICA occlusion. Echo unremarkable. LDL 102 and A1c 5.9. [name] was started on eliquis 5 mg bid and Lipitor 40 for stroke prevention. In terms of left ICA occlusion and right ICA 65% stenosis, vascular surgery was consulted, however, no intervention offered due to right ICA and less than 80% stenosis. [name] was discharged to home with outpatient speech therapy.  12/18/14 follow up - the patient has been doing well. Speech improved although still has partial global aphasia. Blood pressure in clinic 101/66 [name] is on Lopressor for rate control. I'll increase and Lipitor without side effects. Had MRI lumbar spine due to right lower extremity pain, which was unremarkable. No other complaints.  Interval History During the interval time, the patient had improvement in his speech. Had second opinion from Dr. Redmond Pulling in [facility] and still recommend medical treatment for the right ICA stenosis, no intervention recommended. [name] will see Dr. Redmond Pulling again next month for CUS monitoring. [name] still has right sided neuropathic pain and stiffness, has been following with Dr. Letta Pate and now on lyrica 300mg  bid, tramadol 100mg  tid and zanaflex PRN. However, pt stated that they are not helping. BP today 93/65.  [name] had left eye blurry vision after exposure to bright light, lasting 1-2 hours.   REVIEW OF SYSTEMS: Full 14 system review of systems performed and notable only for those listed below and in HPI above, all others are negative:  Constitutional:   Cardiovascular:  Ear/Nose/Throat:  Ringing years Skin:  Eyes:  Blurry vision, light sensitivity Respiratory:   Gastroitestinal:   Genitourinary:  Hematology/Lymphatic:   Endocrine:  Musculoskeletal:  Leg pain Allergy/Immunology:   Neurological:  numbness Psychiatric:  Sleep:   The following represents the patient's updated allergies and side effects list: No Known Allergies  The neurologically relevant items on the patient's problem list were reviewed on today's visit.  Neurologic Examination  A problem focused neurological exam (12 or more points of the single system neurologic examination, vital signs counts as 1 point, cranial nerves count for 8 points) was performed.  Blood pressure 93/65, pulse 91, height 6\' 2"  (1.88 m), weight 206 lb 6.4 oz (93.622 kg).  General - Well nourished, well developed, in no apparent distress.  Ophthalmologic -  Fundi not visualized due to noncooperation.  Cardiovascular - irregularly irregular heart rate and rhythm.  Mental Status -  Level of arousal and orientation to time, place, and person were intact. Language exam showed occasional speech hesitancy, able to follow some simple commands, but not complex commands. Able to name and repeat.   Cranial Nerves II - XII - II - Visual field intact OU. III, IV, VI - Extraocular movements intact. V - right facial sensation decreased. VII - right nasolabial fold flattening. VIII - Hearing & vestibular intact bilaterally X - Palate elevates symmetrically.  XI - Chin turning & shoulder shrug intact bilaterally. XII - Tongue protrusion intact.  Motor Strength - The patient's strength was normal in all extremities and pronator drift was absent.  Bulk was  normal and fasciculations were absent.   Motor Tone - Muscle tone was assessed at the neck and appendages and was normal.  Reflexes - The patient's reflexes were 1+ in all extremities and [name] had no pathological reflexes.  Sensory - Light touch, temperature/pinprick were assessed and were decreased on the right.    Coordination - The patient had normal movements in the hands and feet with no ataxia or dysmetria.  Tremor was absent.  Gait and Station - The patient's transfers, posture, gait, station, and turns were observed as normal.  Data reviewed: I personally reviewed the images and agree with the radiology interpretations.  Dg Chest 2 View  09/25/2014  Mild right basilar atelectasis. Interstitial thickening, this appears chronic.   Ct Head Wo Contrast 09/24/2014  Acute nonhemorrhagic infarction in the posterior left temporal lobe and left parietal lobe. Mild mass effect with sulcal effacement but no midline shift.   Ct Angio Neck W/cm &/or Wo/cm 09/25/2014  1. 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  2. The right internal carotid artery is narrowed to 1.9 mm. This represents the 65% stenosis relative to the more distal vessel at 5.5 mm.  3. The vertebral arteries are intact bilaterally.  4. Degenerative changes of the cervical spine are most evident at C5-6 with mild osseous foraminal narrowing bilaterally.   Mr Brain Wo Contrast 09/25/2014   MRI HEAD:  Acute large LEFT middle cerebral artery territory infarct without hemorrhagic conversion. Tubular susceptibility artifact LEFT cerebrum most compatible with thromboembolism distal LEFT MCA segment. Mild white matter changes compatible with chronic small vessel ischemic disease.   MRA HEAD:  LEFT internal carotid artery occlusion, recommend angiographic imaging in neck for further assessment. Slow flow within LEFT middle cerebral artery, retrograde filling via LEFT PCOM. Complete circle  of Willis. Mild LEFT MCA luminal irregularity which suggests atherosclerosis.   CUS - Right: 40-59% ICA stenosis. Left: ICA is occluded. Bilateral: Vertebral artery flow is antegrade.   2D echo - - Left ventricle: The cavity size was normal. There was mild concentric hypertrophy. Systolic function was normal. The estimated ejection fraction was in the range of 60% to 65%. Wall motion was normal; there were no regional wall motion abnormalities. Left ventricular diastolic function parameters were normal. Lateral annulus E velocity: 11.4 cm/s. Medial annulus E velocity: 9.68 cm/s. - Aorta: Aortic root at upper normal limits of size. Aortic root dimension: 39 mm (ED). - Mitral valve: There was mild regurgitation. - Systemic veins: IVC dilated with normal respiratory variation. Estimated CVP 8 mmHg.  MRI lumbar spine - No significant lumbar spine disc protrusion, foraminal stenosis or central canal stenosis.  Component     Latest Ref Rng 09/25/2014  Cholesterol     0 - 200 mg/dL 156  Triglycerides     <150 mg/dL 117  HDL Cholesterol     >40 mg/dL 31 (L)  Total CHOL/HDL Ratio      5.0  VLDL     0 - 40 mg/dL 23  LDL (calc)     0 - 99 mg/dL 102 (H)  Hemoglobin A1C     4.8 - 5.6 % 5.9 (H)  Mean Plasma Glucose      123    Assessment: As you may recall, [name] is a 60  y.o. Caucasian male with PMH of atrial fibrillation not on anticoagulation, basal cell carcinoma, and nephrolithiasis was admitted on 09/28/14 for large left MCA territory infarct. CTA showed left ICA occlusion with right ICA 65% stenosis. Carotid Doppler showed right ICA 40-59% stenosis and left ICA occlusion. LDL 102 and A1c 5.9. [name] was started on eliquis 5 mg bid and Lipitor 40 for stroke prevention. In terms of left ICA occlusion and right ICA 65% stenosis, vascular surgery was consulted, however, no intervention offered due to right ICA and less than 80% stenosis. Second opinion from Dr. Redmond Pulling at  [facility] also recommend conservative measure for the right ICA stenosis. During the interval time, his speech much improved. 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 Letta Pate, on lyrica, tramadol and zanaflex. Will try trileptal too. BP management, avoid hypotension.  Plan:  - continue eliquis and lipitor for stroke prevention - will try trileptal 300mg  twice a day for Dejerine syndrome. May increase to 600mg  twice a day if not effective - continue lyrica and tramadol for now. - Follow up with your primary care physician for stroke risk factor modification. Recommend maintain blood pressure goal around 130/80, diabetes with hemoglobin A1c goal below 6.5% and lipids with LDL cholesterol goal below 70 mg/dL.  - follow up with cardiology for A. Fib and with Dr. Letta Pate - check BP at home and goal is 120-150 due to left ICA occlusion - follow up in 3 months.  I spent more than 25 minutes of face to face time with the patient. Greater than 50% of time was spent in counseling and coordination of care. We have discussed about further monitor right ICA stenosis, stroke like symptoms, as well as trial of trileptal for his Dejerine syndrome.   No orders of the defined types were placed in this encounter.    Meds ordered this encounter  Medications  . senna (SENOKOT) 8.6 MG tablet    Sig: Take by mouth.    There are no Patient Instructions on file for this visit.  Rosalin Hawking, MD PhD [facility] [address] [place], [place] [address] [phone]

## 2015-03-12 NOTE — Patient Instructions (Signed)
-   continue eliquis and lipitor for stroke prevention - will start trileptal 300mg  twice a day for right sided pain. May increase to 600mg  twice a day if not effective - continue lyrica and tramadol for now. - Follow up with your primary care physician for stroke risk factor modification. Recommend maintain blood pressure goal around 130/80, diabetes with hemoglobin A1c goal below 6.5% and lipids with LDL cholesterol goal below 70 mg/dL.  - follow up with cardiology for A. Fib and with Dr. Letta Pate - check BP at home and goal is 120-150 due to left ICA occlusion - if has left eye vision changes acute except after exposure to light, may need go to ER or call 911 for evaluation - follow up in 3 months.

## 2015-03-23 ENCOUNTER — Encounter: Payer: Self-pay | Admitting: Vascular Surgery

## 2015-04-02 ENCOUNTER — Encounter (HOSPITAL_COMMUNITY): Payer: BLUE CROSS/BLUE SHIELD

## 2015-04-02 ENCOUNTER — Ambulatory Visit: Payer: BLUE CROSS/BLUE SHIELD | Admitting: Vascular Surgery

## 2015-04-08 ENCOUNTER — Telehealth: Payer: Self-pay | Admitting: *Deleted

## 2015-04-08 NOTE — Telephone Encounter (Signed)
Patients wife called and stated that [employer] faxed prior auth forms for eliquis to the office over the weekend. I do not see where a prior auth has been initiated.

## 2015-04-09 NOTE — Telephone Encounter (Signed)
Prior auth for Eliquis 5 mg requested and received today. He has been out, so I have provided one box of samples.

## 2015-05-09 ENCOUNTER — Encounter (HOSPITAL_COMMUNITY): Payer: Self-pay | Admitting: Emergency Medicine

## 2015-05-09 ENCOUNTER — Emergency Department (HOSPITAL_COMMUNITY)
Admission: EM | Admit: 2015-05-09 | Discharge: 2015-05-09 | Disposition: A | Discharge status: Home / Self Care | Payer: BLUE CROSS/BLUE SHIELD | Attending: Emergency Medicine | Admitting: Emergency Medicine

## 2015-05-09 ENCOUNTER — Emergency Department (HOSPITAL_COMMUNITY): Payer: BLUE CROSS/BLUE SHIELD

## 2015-05-09 DIAGNOSIS — G629 Polyneuropathy, unspecified: Secondary | ICD-10-CM | POA: Diagnosis not present

## 2015-05-09 DIAGNOSIS — H538 Other visual disturbances: Secondary | ICD-10-CM | POA: Diagnosis present

## 2015-05-09 DIAGNOSIS — Z8673 Personal history of transient ischemic attack (TIA), and cerebral infarction without residual deficits: Secondary | ICD-10-CM | POA: Diagnosis not present

## 2015-05-09 DIAGNOSIS — I4891 Unspecified atrial fibrillation: Secondary | ICD-10-CM | POA: Diagnosis not present

## 2015-05-09 DIAGNOSIS — Z79899 Other long term (current) drug therapy: Secondary | ICD-10-CM | POA: Diagnosis not present

## 2015-05-09 DIAGNOSIS — Z87442 Personal history of urinary calculi: Secondary | ICD-10-CM | POA: Diagnosis not present

## 2015-05-09 DIAGNOSIS — Z85828 Personal history of other malignant neoplasm of skin: Secondary | ICD-10-CM | POA: Diagnosis not present

## 2015-05-09 DIAGNOSIS — R42 Dizziness and giddiness: Secondary | ICD-10-CM | POA: Diagnosis not present

## 2015-05-09 DIAGNOSIS — Z7901 Long term (current) use of anticoagulants: Secondary | ICD-10-CM | POA: Insufficient documentation

## 2015-05-09 LAB — CBC WITH DIFFERENTIAL/PLATELET
BASOS ABS: 0 10*3/uL (ref 0.0–0.1)
Basophils Relative: 0 %
Eosinophils Absolute: 0 10*3/uL (ref 0.0–0.7)
Eosinophils Relative: 1 %
HEMATOCRIT: 45.8 % (ref 39.0–52.0)
HEMOGLOBIN: 16.1 g/dL (ref 13.0–17.0)
LYMPHS PCT: 35 %
Lymphs Abs: 2.2 10*3/uL (ref 0.7–4.0)
MCH: 30.9 pg (ref 26.0–34.0)
MCHC: 35.2 g/dL (ref 30.0–36.0)
MCV: 87.9 fL (ref 78.0–100.0)
Monocytes Absolute: 0.4 10*3/uL (ref 0.1–1.0)
Monocytes Relative: 7 %
NEUTROS ABS: 3.8 10*3/uL (ref 1.7–7.7)
Neutrophils Relative %: 57 %
Platelets: 235 10*3/uL (ref 150–400)
RBC: 5.21 MIL/uL (ref 4.22–5.81)
RDW: 12.8 % (ref 11.5–15.5)
WBC: 6.5 10*3/uL (ref 4.0–10.5)

## 2015-05-09 LAB — BASIC METABOLIC PANEL
Anion gap: 8 (ref 5–15)
BUN: 15 mg/dL (ref 6–20)
CHLORIDE: 105 mmol/L (ref 101–111)
CO2: 29 mmol/L (ref 22–32)
CREATININE: 1.21 mg/dL (ref 0.61–1.24)
Calcium: 9.6 mg/dL (ref 8.9–10.3)
GFR calc Af Amer: >60 mL/min (ref >60)
GFR calc non Af Amer: >60 mL/min (ref >60)
GLUCOSE: 104 mg/dL — AB (ref 65–99)
POTASSIUM: 4.6 mmol/L (ref 3.5–5.1)
SODIUM: 142 mmol/L (ref 135–145)

## 2015-05-09 NOTE — Discharge Instructions (Signed)
You were evaluated in the ED for your blurred vision. There does not appear to be an emergent cause for your symptoms at this time. Your exam was reassuring as were your labs and CT scan of her head. It is important to follow up with your neurologist next week for reevaluation. Return to ED for new or worsening symptoms as we discussed.  Blurred Vision Having blurred vision means that you cannot see things clearly. Your vision may seem fuzzy or out of focus. Blurred vision is a very common symptom of an eye or vision problem. Blurred vision is often a gradual blur that occurs in one eye or both eyes. There are many causes of blurred vision, including cataracts, macular degeneration, and diabetic retinopathy. Blurred vision can be diagnosed based on your symptoms and a physical exam. Tell your health care provider about any other health problems you have, any recent eye injury, and any prior surgeries. You may need to see a health care provider who specializes in eye problems (ophthalmologist). Your treatment depends on what is causing your blurred vision.  HOME CARE INSTRUCTIONS  Tell your health care provider about any changes in your blurred vision.  Do not drive or operate heavy machinery if your vision is blurry.  Keep all follow-up visits as directed by your health care provider. This is important. SEEK MEDICAL CARE IF:  Your symptoms get worse.  You have new symptoms.  You have trouble seeing at night.  You have trouble seeing up close or far away.  You have trouble noticing the difference between colors. SEEK IMMEDIATE MEDICAL CARE IF:  You have severe eye pain.  You have a severe headache.  You have flashing lights in your field of vision.  You have a sudden change in vision.  You have a sudden loss of vision.  You have vision change after an injury.  You notice drainage coming from your eyes.  You notice a rash around your eyes.   This information is not intended to  replace advice given to you by your health care provider. Make sure you discuss any questions you have with your health care provider.   Document Released: 02/16/2003 Document Revised: 06/30/2014 Document Reviewed: 01/07/2014 Elsevier Interactive Patient Education [employer].

## 2015-05-09 NOTE — ED Provider Notes (Signed)
CSN: [id number]     Arrival date & time 05/09/15  1221 History   First MD Initiated Contact with Patient 05/09/15 1314     Chief Complaint  Patient presents with  . vision changes      (Consider location/radiation/quality/duration/timing/severity/associated sxs/prior Treatment) HPI [name] is a 60 y.o. male with a history of CVA, A. fib, comes in for evaluation of vision changes. Wife at bedside contributing to history of present illness. States patient has a history of expressive aphasia, right-sided weakness that is baseline secondary to his previous CVA. She reports At approximately 10:30 AM this morning, patient was staring out a window into the sun when he developed left eye blurred vision and associated dizziness that lasted approximately 15 minutes and then resolved spontaneously. No other symptoms at that time. She also reports symptoms have completely resolved at this point. She reports the same thing happened approximately 5 weeks ago and also resolved spontaneously. She reports patient as part of a clinical study at [facility], had an MRI done on Friday that did not show any acute findings. No headache other numbness or weakness, difficulty swallowing or other changes in speech.  Past Medical History  Diagnosis Date  . Atrial fibrillation ([place])   . Basal cell carcinoma   . Nephrolithiasis   . Stroke ([place])   . Neuropathy [facility])    Past Surgical History  Procedure Laterality Date  . Lithotripsy    . Vasectomy     Family History  Problem Relation Age of Onset  . Cancer Mother   . Heart failure Father    Social History  Substance Use Topics  . Smoking status: Never Smoker   . Smokeless tobacco: None  . Alcohol Use: Yes     Comment: socially on the weekend with a few beers    Review of Systems A 10 point review of systems was completed and was negative except for pertinent positives and negatives as mentioned in the history of present illness      Allergies  Review of patient's allergies indicates no known allergies.  Home Medications   Prior to Admission medications   Medication Sig Start Date End Date Taking? Authorizing Provider  apixaban (ELIQUIS) 5 MG TABS tablet Take 1 tablet (5 mg total) by mouth 2 (two) times daily. 02/25/15  Yes Sueanne Margarita, MD  atorvastatin (LIPITOR) 40 MG tablet Take 1 tablet (40 mg total) by mouth daily at 6 PM. 02/25/15  Yes Sueanne Margarita, MD  levothyroxine (SYNTHROID, LEVOTHROID) 25 MCG tablet Take 25 mcg by mouth daily before breakfast.  05/04/15  Yes Historical Provider, MD  metoprolol tartrate (LOPRESSOR) 25 MG tablet Take 0.5 tablets (12.5 mg total) by mouth daily. 01/14/15  Yes Sueanne Margarita, MD  pregabalin (LYRICA) 150 MG capsule Take 2 capsules (300 mg total) by mouth 2 (two) times daily. Patient taking differently: Take 150 mg by mouth 4 (four) times daily - after meals and at bedtime.  03/05/15  Yes Charlett Blake, MD  senna-docusate (SENOKOT-S) 8.6-50 MG per tablet Take 2 tablets by mouth 2 (two) times daily. Patient taking differently: Take 2 tablets by mouth at bedtime as needed.  10/09/14  Yes Daniel J Angiulli, PA-C  tiZANidine (ZANAFLEX) 2 MG tablet Take 2 tablets (4 mg total) by mouth every 8 (eight) hours as needed for muscle spasms. 03/05/15  Yes Charlett Blake, MD  traMADol (ULTRAM) 50 MG tablet Take 2 tablets (100 mg total) by mouth 3 (  three) times daily. 03/05/15  Yes Charlett Blake, MD  Oxcarbazepine (TRILEPTAL) 300 MG tablet Take 1 tablet (300 mg total) by mouth 2 (two) times daily. Patient not taking: Reported on 05/09/2015 03/12/15   Rosalin Hawking, MD   BP 100/69 mmHg  Pulse 52  Temp(Src) 98.2 F (36.8 C) (Oral)  Resp 18  SpO2 100% Physical Exam  Constitutional: He is oriented to person, place, and time. He appears well-developed and well-nourished.  HENT:  Head: Normocephalic and atraumatic.  Mouth/Throat: Oropharynx is clear and moist.  Eyes: Conjunctivae are  normal. Pupils are equal, round, and reactive to light. Right eye exhibits no discharge. Left eye exhibits no discharge. No scleral icterus.  Neck: Neck supple.  Cardiovascular: Normal rate, regular rhythm and normal heart sounds.   Pulmonary/Chest: Effort normal and breath sounds normal. No respiratory distress. He has no wheezes. He has no rales.  Abdominal: Soft. There is no tenderness.  Musculoskeletal: He exhibits no tenderness.  Neurological: He is alert and oriented to person, place, and time.  Cranial Nerves II-XII grossly intact. Mild Right-sided lower extremity weakness baseline for patient. Moves extremities without ataxia. Gait is baseline.  Skin: Skin is warm and dry. No rash noted.  Psychiatric: He has a normal mood and affect.  Nursing note and vitals reviewed.   ED Course  Procedures (including critical care time) Labs Review Labs Reviewed  BASIC METABOLIC PANEL - Abnormal; Notable for the following:    Glucose, Bld 104 (*)    All other components within normal limits  CBC WITH DIFFERENTIAL/PLATELET    Imaging Review Ct Head Wo Contrast  05/09/2015  CLINICAL DATA:  60 year old male with decreased vision in the left eye EXAM: CT HEAD WITHOUT CONTRAST TECHNIQUE: Contiguous axial images were obtained from the base of the skull through the vertex without intravenous contrast. COMPARISON:  Prior CT scan of the head 09/24/2014 FINDINGS: Negative for acute intracranial hemorrhage, acute infarction, mass, mass effect, hydrocephalus or midline shift. Gray-white differentiation is preserved throughout. Encephalomalacia in the posterior left frontal and anterior parietal lobes consistent with a prior posterior left MCA territory infarct. No focal soft tissue or calvarial abnormality. IMPRESSION: 1. No acute intracranial abnormality. 2. Remote posterior left MCA territory infarct. Electronically Signed   By: Jacqulynn Cadet M.D.   On: 05/09/2015 13:40   I have personally reviewed and  evaluated these images and lab results as part of my medical decision-making.   EKG Interpretation None     Meds given in ED:  Medications - No data to display  Discharge Medication List as of 05/09/2015  2:50 PM     Filed Vitals:   05/09/15 1226 05/09/15 1500  BP: 99/70 100/69  Pulse: 71 52  Temp: 98.2 F (36.8 C) 98.2 F (36.8 C)  TempSrc: Oral   Resp: 16 18  SpO2: 100% 100%    MDM  LAWSON HOGREFE is a 60 y.o. male with a history of prior CVA, on Eliquis and atorvastatin, comes in for evaluation of left eye blurred vision after staring directly into sunlight. Patient was seen by his neurologist on January 17 for same symptoms. He is followed closely by his neurologist for this problem. He was evaluated in the ED today without any new objective findings. His wife at bedside states that he is baseline. His exam is baseline and unremarkable for any new changes. CT scan also without any acute abnormality. States they prefer to follow up outpatient with neurology. I feel this is a  reasonable request given he has been evaluated for the same symptoms in the past with negative workup. Discussed with my attending, Dr. Rex Kras, who agrees with the plan. Final diagnoses:  Blurred vision, left eye        Comer Locket, PA-C 05/11/15 [place], MD 05/11/15 2257

## 2015-05-09 NOTE — ED Notes (Signed)
Per pt, states he was looking out window, into bright light-wife states when she came from down stairs he said he couldn't see out of left eye-states light sensitivity since stroke a year ago-no headache-wife states she took BP and it was low

## 2015-05-19 ENCOUNTER — Encounter: Payer: Self-pay | Admitting: Rehabilitative and Restorative Service Providers"

## 2015-05-19 NOTE — Therapy (Signed)
[facility] [address] [place], [place], [address] Phone: [phone]   Fax:  [phone]  Patient Details  Name: TAYVEON LOMBARDO MRN: [id number] Date of Birth: 1956/02/03 Referring Provider:  No ref. provider found  Encounter Date: last encounter 10/12/2014  PHYSICAL THERAPY DISCHARGE SUMMARY  Visits from Start of Care: 1  Current functional level related to goals / functional outcomes: See initial evaluation for patient goals.  He did not return to PT.   Remaining deficits: See initial evaluation.   Education / Equipment:n/a, did not return.  Plan: Patient agrees to discharge.  Patient goals were not met. Patient is being discharged due to not returning since the last visit.  ?????       Thank you for the referral of this patient. Rudell Cobb, MPT  Rani Idler 05/19/2015, 7:58 AM  [facility] [address] [place], [place], [address] Phone: [phone]   Fax:  [phone]

## 2015-06-03 ENCOUNTER — Telehealth: Payer: Self-pay | Admitting: Neurology

## 2015-06-03 NOTE — Telephone Encounter (Signed)
Called patient and left a VM informing him he needed to stop by and sign a release and pay his 25 dollar fee. Will place on Katrina RN's desk.

## 2015-06-04 ENCOUNTER — Ambulatory Visit (HOSPITAL_BASED_OUTPATIENT_CLINIC_OR_DEPARTMENT_OTHER): Payer: BLUE CROSS/BLUE SHIELD | Admitting: Physical Medicine & Rehabilitation

## 2015-06-04 ENCOUNTER — Encounter: Payer: Self-pay | Admitting: Physical Medicine & Rehabilitation

## 2015-06-04 ENCOUNTER — Encounter: Payer: BLUE CROSS/BLUE SHIELD | Attending: Physical Medicine & Rehabilitation

## 2015-06-04 VITALS — BP 101/60 | HR 55

## 2015-06-04 DIAGNOSIS — I4891 Unspecified atrial fibrillation: Secondary | ICD-10-CM | POA: Diagnosis not present

## 2015-06-04 DIAGNOSIS — G890 Central pain syndrome: Secondary | ICD-10-CM | POA: Diagnosis not present

## 2015-06-04 DIAGNOSIS — R208 Other disturbances of skin sensation: Secondary | ICD-10-CM | POA: Diagnosis not present

## 2015-06-04 DIAGNOSIS — I69320 Aphasia following cerebral infarction: Secondary | ICD-10-CM | POA: Insufficient documentation

## 2015-06-04 DIAGNOSIS — I69390 Apraxia following cerebral infarction: Secondary | ICD-10-CM | POA: Diagnosis not present

## 2015-06-04 DIAGNOSIS — I69398 Other sequelae of cerebral infarction: Secondary | ICD-10-CM

## 2015-06-04 DIAGNOSIS — M79604 Pain in right leg: Secondary | ICD-10-CM | POA: Diagnosis not present

## 2015-06-04 DIAGNOSIS — I69351 Hemiplegia and hemiparesis following cerebral infarction affecting right dominant side: Secondary | ICD-10-CM | POA: Diagnosis present

## 2015-06-04 DIAGNOSIS — IMO0002 Reserved for concepts with insufficient information to code with codable children: Secondary | ICD-10-CM

## 2015-06-04 DIAGNOSIS — Z85828 Personal history of other malignant neoplasm of skin: Secondary | ICD-10-CM | POA: Insufficient documentation

## 2015-06-04 NOTE — Progress Notes (Signed)
Subjective:    Patient ID: [name], male    DOB: 05/29/1955, 60 y.o.   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 Patient has been on Lyrica as well as Zanaflex and tramadol. Neurology has tried Trileptal but did not tolerate it because it caused too much sedation. Patient has been involved in a study through [facility], speech therapy is coming to the home every day  Has follow-up MRI at [facility], Also having electrostimulation  Has seen neurosurgeon at [facility] and [facility] for second opinion   Patient verbalized understanding of the above.     will continue some speech therapy as an outpatient at [facility] Requesting handicap tag Pain Inventory Average Pain 5 Pain Right Now 5 My pain is constant and aching  In the last 24 hours, has pain interfered with the following? General activity 3 Relation with others 1 Enjoyment of life 1 What TIME of day is your pain at its worst? night Sleep (in general) NA  Pain is worse with: walking Pain improves with: therapy/exercise and medication Relief from Meds: NA  Mobility walk without assistance ability to climb steps?  yes do you drive?  yes  Function disabled: date disabled 09/24/14  Neuro/Psych numbness  Prior Studies Any changes since last visit?  no  Physicians involved in your care Any changes since last visit?  no   Family History  Problem Relation Age of Onset  . Cancer Mother   . Heart failure Father    Social History   Social History  . Marital Status: Married    Spouse Name: N/A  . Number of Children: N/A  . Years of Education: N/A   Social History Main Topics  . Smoking status: Never Smoker   . Smokeless tobacco: None  . Alcohol Use: Yes     Comment: socially on the weekend with a few beers  . Drug Use: No  . Sexual Activity: Not Asked   Other Topics Concern  . None   Social History Narrative    Past Surgical History  Procedure Laterality Date  . Lithotripsy    . Vasectomy     Past Medical History  Diagnosis Date  . Atrial fibrillation ([employer])   . Basal cell carcinoma   . Nephrolithiasis   . Stroke ([place])   . Neuropathy ([facility])    BP 101/60 mmHg  Pulse 55  SpO2 99%  Opioid Risk Score:   Fall Risk Score:  `1  Depression screen PHQ 2/9  Depression screen [facility] 2/9 06/04/2015 10/23/2014  Decreased Interest 0 0  Down, Depressed, Hopeless 0 0  PHQ - 2 Score 0 0  Altered sleeping - 3  Tired, decreased energy - 3  Change in appetite - 0  Feeling bad or failure about yourself  - 0  Trouble concentrating - 0  Moving slowly or fidgety/restless - 0  Suicidal thoughts - 0  PHQ-9 Score - 6  Difficult doing work/chores - Not difficult at all    Review of Systems  All other systems reviewed and are negative.      Objective:   Physical Exam  Constitutional: He appears well-developed and well-nourished.  HENT:  Head: Normocephalic and atraumatic.  Eyes: Conjunctivae are normal. Pupils are equal, round, and reactive to light.  Neck: Normal range of motion.  Neurological: He is alert.  Psychiatric: He has a normal mood and affect.  Nursing note and  vitals reviewed.   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 He has increased lower extremity pain when he walks for longer distances.     Assessment & Plan:  1.Left PCA infarct with mixed aphasia, apraxia,DejerineRoussy syndrome. Has plateaued in his function . As discussed with  patient I do not think that dry needling would be helpful in this situation. I also do not think laser treatments would be of much use. May reduce tramadol to 50 mg 3 times a day, if his pain increases would resume 100 mg 3 times a day Continue Lyrica 300 mg twice a day Would not recommend resuming Trileptal given his side effects  Follow-up in 6 months Handicap tag form filled out

## 2015-06-04 NOTE — Patient Instructions (Signed)
May try reducing tramadol to 1 tablet 3 times per day If pain increases would resume 2 tablets

## 2015-06-07 DIAGNOSIS — Z0289 Encounter for other administrative examinations: Secondary | ICD-10-CM

## 2015-06-10 NOTE — Telephone Encounter (Signed)
[employer] form fax to [phone]. Fax confirm and receive. Fee of 25.00 was paid.

## 2015-07-01 ENCOUNTER — Ambulatory Visit (INDEPENDENT_AMBULATORY_CARE_PROVIDER_SITE_OTHER): Payer: BLUE CROSS/BLUE SHIELD | Admitting: Neurology

## 2015-07-01 ENCOUNTER — Encounter: Payer: Self-pay | Admitting: Neurology

## 2015-07-01 VITALS — BP 98/58 | HR 59 | Ht 74.0 in | Wt 199.0 lb

## 2015-07-01 DIAGNOSIS — I6521 Occlusion and stenosis of right carotid artery: Secondary | ICD-10-CM | POA: Diagnosis not present

## 2015-07-01 DIAGNOSIS — I480 Paroxysmal atrial fibrillation: Secondary | ICD-10-CM | POA: Diagnosis not present

## 2015-07-01 DIAGNOSIS — I63412 Cerebral infarction due to embolism of left middle cerebral artery: Secondary | ICD-10-CM | POA: Diagnosis not present

## 2015-07-01 DIAGNOSIS — G890 Central pain syndrome: Secondary | ICD-10-CM | POA: Diagnosis not present

## 2015-07-01 DIAGNOSIS — I6522 Occlusion and stenosis of left carotid artery: Secondary | ICD-10-CM | POA: Diagnosis not present

## 2015-07-01 MED ORDER — DULOXETINE HCL 60 MG PO CPEP: 60.0000 mg | ORAL_CAPSULE | Freq: Every day | ORAL | Status: DC

## 2015-07-01 MED ORDER — DULOXETINE HCL 30 MG PO CPEP: 30.0000 mg | ORAL_CAPSULE | Freq: Every day | ORAL | Status: DC

## 2015-07-01 NOTE — Progress Notes (Signed)
STROKE NEUROLOGY FOLLOW UP NOTE  NAME: [name] DOB: 06/29/1955  REASON FOR VISIT: stroke follow up HISTORY FROM: pt and wife and chart  Today we had the pleasure of seeing [name] in follow-up at our Neurology Clinic. Pt was accompanied by wife.   History Summary [name] is a 60 y.o. male with history of atrial fibrillation not on anticoagulation, basal cell carcinoma, and nephrolithiasis was admitted on 09/28/14 for prolonged confusion with difficulty communicating and following commands. 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 MRA showed left ICA occlusion, and CTA showed left ICA occlusion with right ICA 65% stenosis. Carotid Doppler showed right ICA 40-59% stenosis and left ICA occlusion. Echo unremarkable. LDL 102 and A1c 5.9. He was started on eliquis 5 mg bid and Lipitor 40 for stroke prevention. In terms of left ICA occlusion and right ICA 65% stenosis, vascular surgery was consulted, however, no intervention offered due to right ICA and less than 80% stenosis. He was discharged to home with outpatient speech therapy.  12/18/14 follow up - the patient has been doing well. Speech improved although still has partial global aphasia. Blood pressure in clinic 101/66 he is on Lopressor for rate control. I'll increase and Lipitor without side effects. Had MRI lumbar spine due to right lower extremity pain, which was unremarkable. No other complaints.  03/12/15 follow up - the patient had improvement in his speech. Had second opinion from Dr. Redmond Pulling in [facility] and still recommend medical treatment for the right ICA stenosis, no intervention recommended. He will see Dr. Redmond Pulling again next month for CUS monitoring. He still has right sided neuropathic pain and stiffness, has been following with Dr. Letta Pate and now on lyrica 300mg  bid, tramadol 100mg  tid and zanaflex PRN. However, pt stated that they are not helping. BP today 93/65. He had left eye blurry  vision after exposure to bright light, lasting 1-2 hours.   Interval History During the interval time, pt has been doing the same. Continues to have right leg numbness, painful feeling, tightness. Right arm and face much improved. He did not tolerate trileptal due to drowsiness and was stopped by himself. Continued on tramadol and lyrica and zanaflex. Able to walk well. Speech about the same, occasional hesitancy. BP still at low side, 98/59. Asymptomatic. On metoprolol 12.5mg  daily only. Recently has poison ivy allergy, on 12 day dose of steroids.  REVIEW OF SYSTEMS: Full 14 system review of systems performed and notable only for those listed below and in HPI above, all others are negative:  Constitutional:   Cardiovascular:  Ear/Nose/Throat:   Skin: rash, itching due to poison ivy Eyes:   light sensitivity Respiratory:   Gastroitestinal:   Genitourinary:  Hematology/Lymphatic:   Endocrine:  Musculoskeletal:   Allergy/Immunology:   Neurological:  Numbness, speech difficulty Psychiatric:  Sleep:   The following represents the patient's updated allergies and side effects list: No Known Allergies  The neurologically relevant items on the patient's problem list were reviewed on today's visit.  Neurologic Examination  A problem focused neurological exam (12 or more points of the single system neurologic examination, vital signs counts as 1 point, cranial nerves count for 8 points) was performed.  Blood pressure 98/58, pulse 59, height 6\' 2"  (1.88 m), weight 199 lb (90.266 kg).  General - Well nourished, well developed, in no apparent distress.  Ophthalmologic -  Fundi not visualized due to noncooperation.  Cardiovascular - irregularly irregular heart rate and rhythm.  Mental  Status -  Level of arousal and orientation to time, place, and person were intact. Language exam showed occasional speech hesitancy, able to follow some simple commands, but not complex commands. Able to name  and repeat.   Cranial Nerves II - XII - II - Visual field intact OU. III, IV, VI - Extraocular movements intact. V - right facial sensation decreased. VII - right nasolabial fold mild flattening. VIII - Hearing & vestibular intact bilaterally X - Palate elevates symmetrically. XI - Chin turning & shoulder shrug intact bilaterally. XII - Tongue protrusion intact.  Motor Strength - The patient's strength was normal in all extremities and pronator drift was absent.  Bulk was normal and fasciculations were absent.   Motor Tone - Muscle tone was assessed at the neck and appendages and was normal.  Reflexes - The patient's reflexes were 1+ in all extremities and he had no pathological reflexes.  Sensory - Light touch, temperature/pinprick were assessed and were decreased on the right.    Coordination - The patient had normal movements in the hands and feet with no ataxia or dysmetria.  Tremor was absent.  Gait and Station - The patient's transfers, posture, gait, station, and turns were observed as normal.  Data reviewed: I personally reviewed the images and agree with the radiology interpretations.  Dg Chest 2 View  09/25/2014  Mild right basilar atelectasis. Interstitial thickening, this appears chronic.   Ct Head Wo Contrast 09/24/2014  Acute nonhemorrhagic infarction in the posterior left temporal lobe and left parietal lobe. Mild mass effect with sulcal effacement but no midline shift.   Ct Angio Neck W/cm &/or Wo/cm 09/25/2014  1. 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  2. The right internal carotid artery is narrowed to 1.9 mm. This represents the 65% stenosis relative to the more distal vessel at 5.5 mm.  3. The vertebral arteries are intact bilaterally.  4. Degenerative changes of the cervical spine are most evident at C5-6 with mild osseous foraminal narrowing bilaterally.   Mr Brain Wo Contrast 09/25/2014   MRI HEAD:  Acute  large LEFT middle cerebral artery territory infarct without hemorrhagic conversion. Tubular susceptibility artifact LEFT cerebrum most compatible with thromboembolism distal LEFT MCA segment. Mild white matter changes compatible with chronic small vessel ischemic disease.   MRA HEAD:  LEFT internal carotid artery occlusion, recommend angiographic imaging in neck for further assessment. Slow flow within LEFT middle cerebral artery, retrograde filling via LEFT PCOM. Complete circle of Willis. Mild LEFT MCA luminal irregularity which suggests atherosclerosis.   CUS - Right: 40-59% ICA stenosis. Left: ICA is occluded. Bilateral: Vertebral artery flow is antegrade.   2D echo - - Left ventricle: The cavity size was normal. There was mild concentric hypertrophy. Systolic function was normal. The estimated ejection fraction was in the range of 60% to 65%. Wall motion was normal; there were no regional wall motion abnormalities. Left ventricular diastolic function parameters were normal. Lateral annulus E velocity: 11.4 cm/s. Medial annulus E velocity: 9.68 cm/s. - Aorta: Aortic root at upper normal limits of size. Aortic root dimension: 39 mm (ED). - Mitral valve: There was mild regurgitation. - Systemic veins: IVC dilated with normal respiratory variation. Estimated CVP 8 mmHg.  MRI lumbar spine - No significant lumbar spine disc protrusion, foraminal stenosis or central canal stenosis.  Component     Latest Ref Rng 09/25/2014  Cholesterol     0 - 200 mg/dL 156  Triglycerides     <150  mg/dL 117  HDL Cholesterol     >40 mg/dL 31 (L)  Total CHOL/HDL Ratio      5.0  VLDL     0 - 40 mg/dL 23  LDL (calc)     0 - 99 mg/dL 102 (H)  Hemoglobin A1C     4.8 - 5.6 % 5.9 (H)  Mean Plasma Glucose      123    Assessment: As you may recall, he is a 60 y.o. Caucasian male with PMH of atrial fibrillation not on anticoagulation, basal cell carcinoma, and nephrolithiasis  was admitted on 09/28/14 for large left MCA territory infarct. CTA showed left ICA occlusion with right ICA 65% stenosis. Carotid Doppler showed right ICA 40-59% stenosis and left ICA occlusion. LDL 102 and A1c 5.9. He was started on eliquis 5 mg bid and Lipitor 40 for stroke prevention. In terms of left ICA occlusion and right ICA 65% stenosis, vascular surgery was consulted, however, no intervention offered due to right ICA and less than 80% stenosis. Second opinion from Dr. Redmond Pulling at [facility] also recommend conservative measure for the right ICA stenosis. During the interval time, his speech much improved. 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 Letta Pate, on lyrica, tramadol and zanaflex. Not tolerating trileptal and will try cymbalta. BP management, avoid hypotension.  Plan:  - continue eliquis and lipitor for stroke prevention - will try cymbalta for right leg neuropathic pain  - continue lyrica, zanaflex and tramadol for now. - Follow up with your primary care physician for stroke risk factor modification. Recommend maintain blood pressure goal around 120/80, diabetes with hemoglobin A1c goal below 6.5% and lipids with LDL cholesterol goal below 70 mg/dL.  - follow up with cardiology for A. Fib and with Dr. Letta Pate - check BP at home and goal is 120-150 due to left ICA occlusion - follow up in 6 months.  I spent more than 25 minutes of face to face time with the patient. Greater than 50% of time was spent in counseling and coordination of care. We have discussed about BP management, stroke like symptoms, as well as trial of cymbalta for his Dejerine syndrome.   No orders of the defined types were placed in this encounter.    Meds ordered this encounter  Medications  . predniSONE (DELTASONE) 10 MG tablet    Sig:   . DULoxetine (CYMBALTA) 30 MG capsule    Sig: Take 1 capsule (30 mg total) by mouth daily.    Dispense:  7 capsule    Refill:  0  .  DULoxetine (CYMBALTA) 60 MG capsule    Sig: Take 1 capsule (60 mg total) by mouth daily.    Dispense:  30 capsule    Refill:  3    Patient Instructions  - continue eliquis and lipitor for stroke prevention - will try cymbalta for right leg neuropathic pain  - continue lyrica and tramadol for now. - Follow up with your primary care physician for stroke risk factor modification. Recommend maintain blood pressure goal around 120/80, diabetes with hemoglobin A1c goal below 6.5% and lipids with LDL cholesterol goal below 70 mg/dL.  - follow up with cardiology for A. Fib and with Dr. Letta Pate - check BP at home and goal is 120-150 due to left ICA occlusion - follow up in 6 months.    Rosalin Hawking, MD PhD [facility] [address] [place], [place] [address] [phone]

## 2015-07-01 NOTE — Patient Instructions (Signed)
-   continue eliquis and lipitor for stroke prevention - will try cymbalta for right leg neuropathic pain  - continue lyrica and tramadol for now. - Follow up with your primary care physician for stroke risk factor modification. Recommend maintain blood pressure goal around 120/80, diabetes with hemoglobin A1c goal below 6.5% and lipids with LDL cholesterol goal below 70 mg/dL.  - follow up with cardiology for A. Fib and with Dr. Letta Pate - check BP at home and goal is 120-150 due to left ICA occlusion - follow up in 6 months.

## 2015-07-22 ENCOUNTER — Telehealth: Payer: Self-pay

## 2015-07-22 NOTE — Telephone Encounter (Signed)
Rn call Santiago Bur at [facility] about receiving patient clearance form for Dr. Shannan Harper stated pts surgery is schedule 08/19/2015.

## 2015-07-28 NOTE — Telephone Encounter (Signed)
Fax clearance form to laurie faust at [phone]  [phone]. Fax was receive and confirm for surgery clearance.

## 2015-08-09 ENCOUNTER — Telehealth: Payer: Self-pay | Admitting: Neurology

## 2015-08-09 DIAGNOSIS — G890 Central pain syndrome: Secondary | ICD-10-CM

## 2015-08-09 NOTE — Telephone Encounter (Signed)
Called two numbers on file, one home phone and one cell phone, but no one pick up the phone. I left VM for them to call back. If they call back, please let them know that if cymbalta is not effective for the pain, we can discontinue it. Thanks.  Rosalin Hawking, MD PhD Stroke Neurology 08/09/2015 3:51 PM

## 2015-08-09 NOTE — Telephone Encounter (Signed)
Patient's wife [name] is calling and states the Rx DULoxetine 60 mg capsules is not helping her husband's leg pain and he would like to come off of it.  She needs a call back as to how to do this. Thanks!

## 2015-08-09 NOTE — Telephone Encounter (Signed)
Rn call patients wife Manuela Schwartz back about the 60mg  of duloxetine not working for her husband leg pain. Pts wife stated her husband is still taking the 60 mg of duloxetine. Wife stated she took her husband off the medication and it made it worse. Rn stated a message will be sent to Dr. Erlinda Hong. PTs wife wants to know can he come off the medication.

## 2015-08-10 MED ORDER — DULOXETINE HCL 30 MG PO CPEP: 30.0000 mg | ORAL_CAPSULE | Freq: Every day | ORAL | Status: DC

## 2015-08-10 NOTE — Telephone Encounter (Signed)
Pt's wife called said she needs to know how to taper off medication.

## 2015-08-10 NOTE — Telephone Encounter (Signed)
Rn call patients wife Manuela Schwartz back about the cymbalta taper off. Rn stated Dr. Erlinda Hong call and left her a vm. Rn stated per Dr. Erlinda Hong note her husband can discontinue the medication. Manuela Schwartz stated she wants her husband to be taper off the medication. Manuela Schwartz stated when she stop the medication for her husband, his pain increase. Rn stated a message will be sent to Dr. Erlinda Hong.

## 2015-08-10 NOTE — Addendum Note (Signed)
Addended by: Rosalin Hawking on: 08/10/2015 01:14 PM   Modules accepted: Orders, Medications

## 2015-08-10 NOTE — Telephone Encounter (Signed)
Called pt wife and discussed over the phone. Wife would like slow taper. He just finished 60mg  cymbalta today. I have ordered 30mg  cymbalta for him for a week to help the slow taper. Wife expressed understanding and appreciation.  Rosalin Hawking, MD PhD Stroke Neurology 08/10/2015 1:14 PM  Meds ordered this encounter  Medications  . DULoxetine (CYMBALTA) 30 MG capsule    Sig: Take 1 capsule (30 mg total) by mouth daily.    Dispense:  7 capsule    Refill:  0

## 2015-08-11 ENCOUNTER — Other Ambulatory Visit: Payer: Self-pay | Admitting: *Deleted

## 2015-08-11 MED ORDER — APIXABAN 5 MG PO TABS: 5.0000 mg | ORAL_TABLET | Freq: Two times a day (BID) | ORAL | Status: DC

## 2015-08-11 MED ORDER — ATORVASTATIN CALCIUM 40 MG PO TABS: 40.0000 mg | ORAL_TABLET | Freq: Every day | ORAL | Status: DC

## 2015-08-11 MED ORDER — METOPROLOL TARTRATE 25 MG PO TABS: 12.5000 mg | ORAL_TABLET | Freq: Every day | ORAL | Status: DC

## 2015-08-27 ENCOUNTER — Other Ambulatory Visit: Payer: Self-pay

## 2015-08-27 MED ORDER — PREGABALIN 150 MG PO CAPS: 300.0000 mg | ORAL_CAPSULE | Freq: Two times a day (BID) | ORAL | Status: DC

## 2015-08-27 MED ORDER — TRAMADOL HCL 50 MG PO TABS: 100.0000 mg | ORAL_TABLET | Freq: Three times a day (TID) | ORAL | Status: DC

## 2015-08-27 NOTE — Telephone Encounter (Signed)
Place a call to [name], no answer. Would like to see how he is taking the Tramadol. Dr. Letta Pate note asked patient to decrease if he was able to tolerate. Awaiting a call back.

## 2015-08-27 NOTE — Telephone Encounter (Signed)
Pt is requesting refills. Can you please print and sign if applicable? [facility] will [facility] His next visit is in October, so I included 3 refills on meds and orders.

## 2015-09-10 ENCOUNTER — Other Ambulatory Visit: Payer: Self-pay | Admitting: Physical Medicine & Rehabilitation

## 2015-09-10 NOTE — Telephone Encounter (Signed)
Patient's wife is calling back about prescriptions that were to be sent to [facility], their number is [phone].  She also called her pharmacy about Lyrica prescription at [facility].  Can someone please call her at [phone].

## 2015-09-10 NOTE — Telephone Encounter (Signed)
I tried to call [name] back but no answer.  I called the Lyrica to [facility]

## 2015-09-17 ENCOUNTER — Other Ambulatory Visit (HOSPITAL_COMMUNITY): Payer: BLUE CROSS/BLUE SHIELD

## 2015-09-20 ENCOUNTER — Encounter: Payer: Self-pay | Admitting: Cardiology

## 2015-09-23 ENCOUNTER — Telehealth: Payer: Self-pay | Admitting: Physical Medicine & Rehabilitation

## 2015-09-23 ENCOUNTER — Other Ambulatory Visit: Payer: Self-pay | Admitting: Physical Medicine & Rehabilitation

## 2015-09-23 MED ORDER — PREGABALIN 150 MG PO CAPS: 300.0000 mg | ORAL_CAPSULE | Freq: Two times a day (BID) | ORAL | 1 refills | Status: DC

## 2015-09-23 NOTE — Telephone Encounter (Signed)
Patient needs his medications switched over to [facility], Lyric and Tramadol.  Wife [name] gave and address of [address]; [place]  [address].  Any questions please call [name] at [phone].

## 2015-09-23 NOTE — Telephone Encounter (Signed)
Lyrica printed to be faxed to [facility].  The tramadol looks like it is already with the pharmacy.  Rx on DR Kirsteins desk to sign.

## 2015-09-24 ENCOUNTER — Other Ambulatory Visit: Payer: Self-pay | Admitting: *Deleted

## 2015-09-24 MED ORDER — TRAMADOL HCL 50 MG PO TABS: 100.0000 mg | ORAL_TABLET | Freq: Three times a day (TID) | ORAL | 3 refills | Status: DC

## 2015-09-24 MED ORDER — PREGABALIN 150 MG PO CAPS: 300.0000 mg | ORAL_CAPSULE | Freq: Two times a day (BID) | ORAL | 0 refills | Status: DC

## 2015-09-24 MED ORDER — TRAMADOL HCL 50 MG PO TABS: 100.0000 mg | ORAL_TABLET | Freq: Three times a day (TID) | ORAL | 0 refills | Status: DC

## 2015-09-24 NOTE — Addendum Note (Signed)
Addended by: Caro Hight on: 09/24/2015 03:47 PM   Modules accepted: Orders

## 2015-09-24 NOTE — Telephone Encounter (Signed)
lyrica and tramadol rx's faxed to [facility] [phone]). 90 day supply

## 2015-09-27 ENCOUNTER — Telehealth: Payer: Self-pay | Admitting: *Deleted

## 2015-09-27 NOTE — Telephone Encounter (Signed)
Lyrica RX faxed to [facility]

## 2015-10-01 ENCOUNTER — Ambulatory Visit (HOSPITAL_COMMUNITY): Payer: BLUE CROSS/BLUE SHIELD | Attending: Cardiology

## 2015-10-01 ENCOUNTER — Other Ambulatory Visit: Payer: Self-pay

## 2015-10-01 DIAGNOSIS — I77810 Thoracic aortic ectasia: Secondary | ICD-10-CM | POA: Insufficient documentation

## 2015-10-01 DIAGNOSIS — I480 Paroxysmal atrial fibrillation: Secondary | ICD-10-CM | POA: Diagnosis not present

## 2015-10-01 DIAGNOSIS — I340 Nonrheumatic mitral (valve) insufficiency: Secondary | ICD-10-CM | POA: Diagnosis not present

## 2015-10-04 ENCOUNTER — Telehealth: Payer: Self-pay

## 2015-10-04 DIAGNOSIS — I4891 Unspecified atrial fibrillation: Secondary | ICD-10-CM

## 2015-10-04 DIAGNOSIS — R0600 Dyspnea, unspecified: Secondary | ICD-10-CM

## 2015-10-04 NOTE — Telephone Encounter (Signed)
-----   Message from Sueanne Margarita, MD sent at 10/01/2015  1:28 PM EDT ----- Echo showed normal LVF with borderline enlarged ascending aorta and mild MR>  Please repeat echo in 1 year

## 2015-10-07 ENCOUNTER — Ambulatory Visit: Payer: BLUE CROSS/BLUE SHIELD | Admitting: Cardiology

## 2015-10-20 ENCOUNTER — Encounter: Payer: Self-pay | Admitting: Cardiology

## 2015-10-20 DIAGNOSIS — I77810 Thoracic aortic ectasia: Secondary | ICD-10-CM

## 2015-10-20 HISTORY — DX: Thoracic aortic ectasia: I77.810

## 2015-10-20 NOTE — Progress Notes (Signed)
Cardiology Office Note    Date:  10/21/2015   ID:  [name], DOB 1955/10/14, MRN [id number]  PCP:  Gennette Pac, MD  Cardiologist:  Fransico Him, MD   Chief Complaint  Patient presents with  . Atrial Fibrillation    History of Present Illness:  [name] is a 60 y.o. male with a history of PAF with CHADS2VASC score 2 (CVA) on Apixaban who presents today for followup.   He also has a history of left MCA CVA.  2D echo 09/2015 for followup of dilated aortic root showed normal LVF with mildly dilated aortic root that is stable.  He presents back today for followup.   He denies any chest pain, SOB, DOE, PND, orthopnea, palpitations, dizziness or LE edema.    Past Medical History:  Diagnosis Date  . Basal cell carcinoma   . Bradycardia 10/21/2015  . Dilated aortic root ([place]) 10/20/2015  . Nephrolithiasis   . Neuropathy ([place])   . PAF (paroxysmal atrial fibrillation) (HCC)    CHADS2VASC score is 2 (hx of CVA)  . Stroke [facility])     Past Surgical History:  Procedure Laterality Date  . LITHOTRIPSY    . VASECTOMY      Current Medications: Outpatient Medications Prior to Visit  Medication Sig Dispense Refill  . apixaban (ELIQUIS) 5 MG TABS tablet Take 1 tablet (5 mg total) by mouth 2 (two) times daily. 180 tablet 1  . atorvastatin (LIPITOR) 40 MG tablet Take 1 tablet (40 mg total) by mouth daily at 6 PM. 90 tablet 1  . levothyroxine (SYNTHROID, LEVOTHROID) 25 MCG tablet Take 25 mcg by mouth daily before breakfast.     . metoprolol tartrate (LOPRESSOR) 25 MG tablet Take 0.5 tablets (12.5 mg total) by mouth daily. 45 tablet 1  . pregabalin (LYRICA) 150 MG capsule Take 2 capsules (300 mg total) by mouth 2 (two) times daily. 360 capsule 0  . senna-docusate (SENOKOT-S) 8.6-50 MG per tablet Take 2 tablets by mouth 2 (two) times daily. (Patient taking differently: Take 2 tablets by mouth at bedtime as needed. )    . traMADol (ULTRAM) 50 MG tablet Take 2 tablets (100  mg total) by mouth 3 (three) times daily. 540 tablet 0  . DULoxetine (CYMBALTA) 30 MG capsule Take 1 capsule (30 mg total) by mouth daily. 7 capsule 0  . predniSONE (DELTASONE) 10 MG tablet     . tiZANidine (ZANAFLEX) 2 MG tablet Take 2 tablets (4 mg total) by mouth every 8 (eight) hours as needed for muscle spasms. 90 tablet 1   No facility-administered medications prior to visit.      Allergies:   Review of patient's allergies indicates no known allergies.   Social History   Social History  . Marital status: Married    Spouse name: N/A  . Number of children: N/A  . Years of education: N/A   Social History Main Topics  . Smoking status: Never Smoker  . Smokeless tobacco: Never Used  . Alcohol use 0.6 oz/week    1 Cans of beer per week     Comment: occasionally maybe the weekends  . Drug use: No  . Sexual activity: Not Asked   Other Topics Concern  . None   Social History Narrative  . None     Family History:  The patient's family history includes Cancer in his mother; Heart failure in his father.   ROS:   Please see the history of present illness.  ROS All other systems reviewed and are negative.   PHYSICAL EXAM:   VS:  BP 126/72   Pulse (!) 54   Ht 6\' 2"  (1.88 m)   Wt 199 lb (90.3 kg)   BMI 25.55 kg/m    GEN: Well nourished, well developed, in no acute distress  HEENT: normal  Neck: no JVD, carotid bruits, or masses Cardiac: RRR; no murmurs, rubs, or gallops,no edema.  Intact distal pulses bilaterally.  Respiratory:  clear to auscultation bilaterally, normal work of breathing GI: soft, nontender, nondistended, + BS MS: no deformity or atrophy  Skin: warm and dry, no rash Neuro:  Alert and Oriented x 3, Strength and sensation are intact Psych: euthymic mood, full affect  Wt Readings from Last 3 Encounters:  10/21/15 199 lb (90.3 kg)  07/01/15 199 lb (90.3 kg)  03/12/15 206 lb 6.4 oz (93.6 kg)      Studies/Labs Reviewed:   EKG:  EKG is  ordered  today.  The ekg ordered today demonstrates sinus bradycardia at 54bpm with no ST changes and normal intervals  Recent Labs: 01/14/2015: TSH 3.178 05/09/2015: BUN 15; Creatinine, Ser 1.21; Hemoglobin 16.1; Platelets 235; Potassium 4.6; Sodium 142   Lipid Panel    Component Value Date/Time   CHOL 156 09/25/2014 0400   TRIG 117 09/25/2014 0400   HDL 31 (L) 09/25/2014 0400   CHOLHDL 5.0 09/25/2014 0400   VLDL 23 09/25/2014 0400   LDLCALC 102 (H) 09/25/2014 0400    Additional studies/ records that were reviewed today include:  none    ASSESSMENT:    1. PAF (paroxysmal atrial fibrillation) ([place])   2. Dilated aortic root ([place])   3. Bradycardia      PLAN:  In order of problems listed above:  1. PAF - maintaining NSR.  Continue BB therapy and Apixaban for CHADS2VASC score of 2 (CVA). 2. Dilated aortic root - 76mm - stable - repeat echo in 1 year.  Continue statin.   3. Bradycardia - asymptomatic    Medication Adjustments/Labs and Tests Ordered: Current medicines are reviewed at length with the patient today.  Concerns regarding medicines are outlined above.  Medication changes, Labs and Tests ordered today are listed in the Patient Instructions below.  There are no Patient Instructions on file for this visit.   Signed, Fransico Him, MD  10/21/2015 2:12 PM    [facility] [place], [place], [place]  [address] Phone: [phone]; Fax: [phone]

## 2015-10-21 ENCOUNTER — Encounter: Payer: Self-pay | Admitting: Cardiology

## 2015-10-21 ENCOUNTER — Ambulatory Visit (INDEPENDENT_AMBULATORY_CARE_PROVIDER_SITE_OTHER): Payer: BLUE CROSS/BLUE SHIELD | Admitting: Cardiology

## 2015-10-21 VITALS — BP 126/72 | HR 54 | Ht 74.0 in | Wt 199.0 lb

## 2015-10-21 DIAGNOSIS — I480 Paroxysmal atrial fibrillation: Secondary | ICD-10-CM | POA: Diagnosis not present

## 2015-10-21 DIAGNOSIS — I77810 Thoracic aortic ectasia: Secondary | ICD-10-CM | POA: Diagnosis not present

## 2015-10-21 DIAGNOSIS — R001 Bradycardia, unspecified: Secondary | ICD-10-CM

## 2015-10-21 HISTORY — DX: Bradycardia, unspecified: R00.1

## 2015-10-21 LAB — CBC WITH DIFFERENTIAL/PLATELET
BASOS ABS: 0 {cells}/uL (ref 0–200)
Basophils Relative: 0 %
EOS ABS: 57 {cells}/uL (ref 15–500)
Eosinophils Relative: 1 %
HEMATOCRIT: 42.8 % (ref 38.5–50.0)
HEMOGLOBIN: 14.2 g/dL (ref 13.2–17.1)
Lymphocytes Relative: 31 %
Lymphs Abs: 1767 cells/uL (ref 850–3900)
MCH: 30.1 pg (ref 27.0–33.0)
MCHC: 33.2 g/dL (ref 32.0–36.0)
MCV: 90.9 fL (ref 80.0–100.0)
MONO ABS: 627 {cells}/uL (ref 200–950)
MPV: 11.5 fL (ref 7.5–12.5)
Monocytes Relative: 11 %
NEUTROS PCT: 57 %
Neutro Abs: 3249 cells/uL (ref 1500–7800)
Platelets: 240 10*3/uL (ref 140–400)
RBC: 4.71 MIL/uL (ref 4.20–5.80)
RDW: 13 % (ref 11.0–15.0)
WBC: 5.7 10*3/uL (ref 3.8–10.8)

## 2015-10-21 LAB — BASIC METABOLIC PANEL
BUN: 12 mg/dL (ref 7–25)
CALCIUM: 9.4 mg/dL (ref 8.6–10.3)
CHLORIDE: 103 mmol/L (ref 98–110)
CO2: 30 mmol/L (ref 20–31)
Creat: 0.93 mg/dL (ref 0.70–1.25)
GLUCOSE: 77 mg/dL (ref 65–99)
Potassium: 4.6 mmol/L (ref 3.5–5.3)
SODIUM: 143 mmol/L (ref 135–146)

## 2015-10-21 NOTE — Patient Instructions (Signed)
Medication Instructions:  Your physician recommends that you continue on your current medications as directed. Please refer to the Current Medication list given to you today.   Labwork: TODAY: CBC, BMET  Testing/Procedures: None  Follow-Up: Your physician wants you to follow-up in: 6 months with Dr. Radford Pax. You will receive a reminder letter in the mail two months in advance. If you don't receive a letter, please call our office to schedule the follow-up appointment.   Any Other Special Instructions Will Be Listed Below (If Applicable).     If you need a refill on your cardiac medications before your next appointment, please call your pharmacy.

## 2015-12-03 ENCOUNTER — Encounter: Payer: BLUE CROSS/BLUE SHIELD | Attending: Physical Medicine & Rehabilitation

## 2015-12-03 ENCOUNTER — Encounter: Payer: Self-pay | Admitting: Physical Medicine & Rehabilitation

## 2015-12-03 ENCOUNTER — Ambulatory Visit (HOSPITAL_BASED_OUTPATIENT_CLINIC_OR_DEPARTMENT_OTHER): Payer: BLUE CROSS/BLUE SHIELD | Admitting: Physical Medicine & Rehabilitation

## 2015-12-03 VITALS — BP 111/77 | HR 64

## 2015-12-03 DIAGNOSIS — M7989 Other specified soft tissue disorders: Secondary | ICD-10-CM | POA: Diagnosis not present

## 2015-12-03 DIAGNOSIS — G629 Polyneuropathy, unspecified: Secondary | ICD-10-CM | POA: Diagnosis not present

## 2015-12-03 DIAGNOSIS — G8929 Other chronic pain: Secondary | ICD-10-CM | POA: Insufficient documentation

## 2015-12-03 DIAGNOSIS — Z7901 Long term (current) use of anticoagulants: Secondary | ICD-10-CM | POA: Diagnosis not present

## 2015-12-03 DIAGNOSIS — R209 Unspecified disturbances of skin sensation: Secondary | ICD-10-CM | POA: Insufficient documentation

## 2015-12-03 DIAGNOSIS — Z85828 Personal history of other malignant neoplasm of skin: Secondary | ICD-10-CM | POA: Diagnosis not present

## 2015-12-03 DIAGNOSIS — R1031 Right lower quadrant pain: Secondary | ICD-10-CM

## 2015-12-03 DIAGNOSIS — I639 Cerebral infarction, unspecified: Secondary | ICD-10-CM | POA: Insufficient documentation

## 2015-12-03 DIAGNOSIS — IMO0002 Reserved for concepts with insufficient information to code with codable children: Secondary | ICD-10-CM

## 2015-12-03 DIAGNOSIS — S8012XA Contusion of left lower leg, initial encounter: Secondary | ICD-10-CM | POA: Insufficient documentation

## 2015-12-03 DIAGNOSIS — Z9852 Vasectomy status: Secondary | ICD-10-CM | POA: Diagnosis not present

## 2015-12-03 DIAGNOSIS — Z8249 Family history of ischemic heart disease and other diseases of the circulatory system: Secondary | ICD-10-CM | POA: Insufficient documentation

## 2015-12-03 DIAGNOSIS — I480 Paroxysmal atrial fibrillation: Secondary | ICD-10-CM | POA: Diagnosis not present

## 2015-12-03 DIAGNOSIS — Z8673 Personal history of transient ischemic attack (TIA), and cerebral infarction without residual deficits: Secondary | ICD-10-CM | POA: Insufficient documentation

## 2015-12-03 DIAGNOSIS — R4701 Aphasia: Secondary | ICD-10-CM

## 2015-12-03 DIAGNOSIS — G890 Central pain syndrome: Secondary | ICD-10-CM | POA: Insufficient documentation

## 2015-12-03 HISTORY — DX: Contusion of left lower leg, initial encounter: S80.12XA

## 2015-12-03 MED ORDER — TRAMADOL HCL 50 MG PO TABS: 100.0000 mg | ORAL_TABLET | Freq: Three times a day (TID) | ORAL | 1 refills | Status: DC

## 2015-12-03 MED ORDER — PREGABALIN 150 MG PO CAPS: 300.0000 mg | ORAL_CAPSULE | Freq: Two times a day (BID) | ORAL | 1 refills | Status: DC

## 2015-12-03 NOTE — Progress Notes (Signed)
Subjective:    Patient ID: [name], male    DOB: March 17, 1955, 60 y.o.   MRN: [id number] September 25, 2014, had undergone right ear tympanoplasty, noted post procedure to have altered mental status and difficulty communicating. CT of the head showed acute moderate-sized left temporal parietal ischemic infarct with mild mass effect. HPI Patient with paroxysmal atrial fibrillation on Eliquis Bell off a ladder a month or 2 ago and bumped his left leg. He noted some swelling and some bruising in that area. His primary care check that area and did not feel there was any additional workup needed. Patient states that overall this has improved. He still notes some swelling that has persisted. In addition, patient complains of pain around his right hip. Because of his aphasia, it is difficult to tell whether or not this is associated with GI issues or more of a musculoskeletal issue. It is not clear whether or not his pain increases with activity. He has not had any surgery in that area. His last colonoscopy is more than 5 years ago. He has a history of kidney stones but he does not have any flank pain. His usual right-sided burning pain is doing pretty well and has about a 4 out of 10 intensity. He continues to take Lyrica 300 mg twice a day as well as tramadol 100 mg 3 times per day. The patient and his wife do not feel he is having any negative side effects.  Pain Inventory Average Pain 4 Pain Right Now 4 My pain is aching  In the last 24 hours, has pain interfered with the following? General activity 3 Relation with others 1 Enjoyment of life 3 What TIME of day is your pain at its worst? daytime Sleep (in general) Good  Pain is worse with: walking and standing Pain improves with: rest and heat/ice Relief from Meds: 4  Mobility walk without assistance ability to climb steps?  yes do you drive?  yes  Function disabled: date disabled 09/24/2014  Neuro/Psych spasms  Prior  Studies Any changes since last visit?  no  Physicians involved in your care Any changes since last visit?  no   Family History  Problem Relation Age of Onset  . Cancer Mother   . Heart failure Father    Social History   Social History  . Marital status: Married    Spouse name: N/A  . Number of children: N/A  . Years of education: N/A   Social History Main Topics  . Smoking status: Never Smoker  . Smokeless tobacco: Never Used  . Alcohol use 0.6 oz/week    1 Cans of beer per week     Comment: occasionally maybe the weekends  . Drug use: No  . Sexual activity: Not Asked   Other Topics Concern  . None   Social History Narrative  . None   Past Surgical History:  Procedure Laterality Date  . LITHOTRIPSY    . VASECTOMY     Past Medical History:  Diagnosis Date  . Basal cell carcinoma   . Bradycardia 10/21/2015  . Dilated aortic root ([place]) 10/20/2015  . Nephrolithiasis   . Neuropathy ([place])   . PAF (paroxysmal atrial fibrillation) (HCC)    CHADS2VASC score is 2 (hx of CVA)  . Stroke (HCC)    BP 111/77   Pulse 64   SpO2 95%   Opioid Risk Score:   Fall Risk Score:  `1  Depression screen PHQ 2/9  Depression screen PHQ  2/9 06/04/2015 10/23/2014  Decreased Interest 0 0  Down, Depressed, Hopeless 0 0  PHQ - 2 Score 0 0  Altered sleeping - 3  Tired, decreased energy - 3  Change in appetite - 0  Feeling bad or failure about yourself  - 0  Trouble concentrating - 0  Moving slowly or fidgety/restless - 0  Suicidal thoughts - 0  PHQ-9 Score - 6  Difficult doing work/chores - Not difficult at all    Review of Systems  All other systems reviewed and are negative.      Objective:   Physical Exam  Constitutional: He appears well-developed and well-nourished.  HENT:  Head: Normocephalic and atraumatic.  Eyes: Conjunctivae are normal. Pupils are equal, round, and reactive to light.  Cardiovascular: Normal rate and normal heart sounds.  An irregularly irregular  rhythm present.  Occasional extrasystoles are present.  Abdominal: Soft. Bowel sounds are normal. He exhibits no distension, no abdominal bruit and no mass. There is no hepatosplenomegaly. There is tenderness in the right lower quadrant. There is no rigidity, no rebound, no guarding, no CVA tenderness and no tenderness at McBurney's point. Hernia confirmed negative in the right inguinal area.  Musculoskeletal:       Right hip: He exhibits tenderness. He exhibits normal range of motion, no bony tenderness, no swelling and no deformity.       Left lower leg: He exhibits swelling. He exhibits no tenderness.  Neurological: He is alert. He has normal strength. A sensory deficit is present. Gait normal.  A phasic. An accurate with yes no's, difficulty with manual muscle testing due to apraxia needs visual cues and repetition  Nursing note and vitals reviewed.  There is evidence of firm ropey mass medial aspect of the left calf. This is nontender. There is mild surrounding ecchymosis. No pain with ankle range of motion. No swelling in ankle joint or in the knee joint. No pain with knee range of motion. Negative Homans.       Assessment & Plan:  1. 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 His strength has normalized, he still has sensory deficits on the right side. We discussed that he should not be up on ladders, given his stroke related deficits. 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 Overall, his pain has improved on a combination of tramadol 100 mg 3 times a day and Lyrica 300 mg twice a day. We'll continue current medications. He has tried Cymbalta, but this was not helpful. 3. Right lower quadrant abdominal pain. Exam is negative. He does have some pain in the inguinal area, but I do not see or palpate an inguinal hernia. It does not seem to be coming from the hip joint. Because of his aphasia, it is difficult to  elicit a clear history. Instructed patient to follow up with primary care. May be due for another colonoscopy. If this is the case, he would need to get clearance from neurology and also the okay to come off of his anticoagulation. . 4. Left calf swelling, nontender. It feels like there is some thrombosis of superficial vessels. His left ankle and foot have no pain with range of motion and there is no joint swelling in the ankle or the knee. Expect this to improve over time. This is likely due to direct contusion when he fell off his ladder.

## 2015-12-03 NOTE — Patient Instructions (Signed)
I don't think the pain in your right lower abdomen, is due to muscle or joint issues, and I do not think it is related to your stroke. Please discuss this with your primary care physician. You may be due for another colonoscopy at this point.  Stay off ladders

## 2015-12-31 ENCOUNTER — Ambulatory Visit (INDEPENDENT_AMBULATORY_CARE_PROVIDER_SITE_OTHER): Payer: BLUE CROSS/BLUE SHIELD | Admitting: Neurology

## 2015-12-31 ENCOUNTER — Encounter: Payer: Self-pay | Admitting: Neurology

## 2015-12-31 VITALS — BP 112/71 | HR 57 | Wt 208.0 lb

## 2015-12-31 DIAGNOSIS — G890 Central pain syndrome: Secondary | ICD-10-CM

## 2015-12-31 DIAGNOSIS — IMO0002 Reserved for concepts with insufficient information to code with codable children: Secondary | ICD-10-CM

## 2015-12-31 DIAGNOSIS — I480 Paroxysmal atrial fibrillation: Secondary | ICD-10-CM

## 2015-12-31 DIAGNOSIS — R4701 Aphasia: Secondary | ICD-10-CM

## 2015-12-31 DIAGNOSIS — I63412 Cerebral infarction due to embolism of left middle cerebral artery: Secondary | ICD-10-CM

## 2015-12-31 DIAGNOSIS — I6521 Occlusion and stenosis of right carotid artery: Secondary | ICD-10-CM | POA: Diagnosis not present

## 2015-12-31 DIAGNOSIS — I639 Cerebral infarction, unspecified: Secondary | ICD-10-CM

## 2015-12-31 NOTE — Progress Notes (Signed)
STROKE NEUROLOGY FOLLOW UP NOTE  NAME: [name] DOB: 08/22/1955  REASON FOR VISIT: stroke follow up HISTORY FROM: pt and wife and chart  Today we had the pleasure of seeing HARL VANDERKOOI in follow-up at our Neurology Clinic. Pt was accompanied by wife.   History Summary Mr. ACESON KUHLMANN is a 60 y.o. male with history of atrial fibrillation not on anticoagulation, basal cell carcinoma, and nephrolithiasis was admitted on 09/28/14 for prolonged confusion with difficulty communicating and following commands. 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 MRA showed left ICA occlusion, and CTA showed left ICA occlusion with right ICA 65% stenosis. Carotid Doppler showed right ICA 40-59% stenosis and left ICA occlusion. Echo unremarkable. LDL 102 and A1c 5.9. He was started on eliquis 5 mg bid and Lipitor 40 for stroke prevention. In terms of left ICA occlusion and right ICA 65% stenosis, vascular surgery was consulted, however, no intervention offered due to right ICA and less than 80% stenosis. He was discharged to home with outpatient speech therapy.  12/18/14 follow up - the patient has been doing well. Speech improved although still has partial global aphasia. Blood pressure in clinic 101/66 he is on Lopressor for rate control. I'll increase and Lipitor without side effects. Had MRI lumbar spine due to right lower extremity pain, which was unremarkable. No other complaints.  03/12/15 follow up - the patient had improvement in his speech. Had second opinion from Dr. Redmond Pulling in [facility] and still recommend medical treatment for the right ICA stenosis, no intervention recommended. He will see Dr. Redmond Pulling again next month for CUS monitoring. He still has right sided neuropathic pain and stiffness, has been following with Dr. Letta Pate and now on lyrica 300mg  bid, tramadol 100mg  tid and zanaflex PRN. However, pt stated that they are not helping. BP today 93/65. He had left eye blurry  vision after exposure to bright light, lasting 1-2 hours.   07/01/15 follow up - pt has been doing the same. Continues to have right leg numbness, painful feeling, tightness. Right arm and face much improved. He did not tolerate trileptal due to drowsiness and was stopped by himself. Continued on tramadol and lyrica and zanaflex. Able to walk well. Speech about the same, occasional hesitancy. BP still at low side, 98/59. Asymptomatic. On metoprolol 12.5mg  daily only. Recently has poison ivy allergy, on 12 day dose of steroids.  Interval History During the interval time, pt has been doing better. He continues to have right leg painful feeling but not able to tolerate cymbalta, currently on lyrica and tramadol. Speech seems improved and with more words out. BP better 112/79 after metoprolol discontinued. He recently has intermittent flank pain on the right and considered kidney stone. Is going to have CT done next Friday. Today he also complains of right dorsal hand itching and swollen. Denies any allergy and insect bites. BP at home 110-130 most of the time.   REVIEW OF SYSTEMS: Full 14 system review of systems performed and notable only for those listed below and in HPI above, all others are negative:  Constitutional:   Cardiovascular:  Ear/Nose/Throat:   Skin: itching in hand Eyes:   Respiratory:   Gastroitestinal:  Abdominal pain Genitourinary: blood in urine Hematology/Lymphatic:   Endocrine: cold intolerance Musculoskeletal:  Back pain Allergy/Immunology:   Neurological:  Numbness, speech difficulty Psychiatric:  Sleep:   The following represents the patient's updated allergies and side effects list: No Known Allergies  The neurologically relevant items on  the patient's problem list were reviewed on today's visit.  Neurologic Examination  A problem focused neurological exam (12 or more points of the single system neurologic examination, vital signs counts as 1 point, cranial nerves  count for 8 points) was performed.  Blood pressure 112/71, pulse (!) 57, weight 208 lb (94.3 kg).  General - Well nourished, well developed, in no apparent distress.  Ophthalmologic -  Fundi not visualized due to noncooperation.  Cardiovascular - irregularly irregular heart rate and rhythm.  Mental Status -  Level of arousal and orientation to time, place, and person were intact. Language exam showed occasional speech hesitancy and paraphasic errors, able to follow some simple commands, but not complex commands. Able to name and repeat.   Cranial Nerves II - XII - II - Visual field intact OU. III, IV, VI - Extraocular movements intact. V - right facial sensation decreased. VII - right nasolabial fold mild flattening. VIII - Hearing & vestibular intact bilaterally X - Palate elevates symmetrically. XI - Chin turning & shoulder shrug intact bilaterally. XII - Tongue protrusion intact.  Motor Strength - The patient's strength was normal in all extremities and pronator drift was absent.  Bulk was normal and fasciculations were absent.   Motor Tone - Muscle tone was assessed at the neck and appendages and was normal.  Reflexes - The patient's reflexes were 1+ in all extremities and he had no pathological reflexes.  Sensory - Light touch, temperature/pinprick were assessed and were decreased on the right.    Coordination - The patient had normal movements in the hands and feet with no ataxia or dysmetria.  Tremor was absent.  Gait and Station - The patient's transfers, posture, gait, station, and turns were observed as normal.  Data reviewed: I personally reviewed the images and agree with the radiology interpretations.  Dg Chest 2 View  09/25/2014  Mild right basilar atelectasis. Interstitial thickening, this appears chronic.   Ct Head Wo Contrast 09/24/2014  Acute nonhemorrhagic infarction in the posterior left temporal lobe and left parietal lobe. Mild mass effect with  sulcal effacement but no midline shift.   Ct Angio Neck W/cm &/or Wo/cm 09/25/2014  1. 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  2. The right internal carotid artery is narrowed to 1.9 mm. This represents the 65% stenosis relative to the more distal vessel at 5.5 mm.  3. The vertebral arteries are intact bilaterally.  4. Degenerative changes of the cervical spine are most evident at C5-6 with mild osseous foraminal narrowing bilaterally.   Mr Brain Wo Contrast 09/25/2014   MRI HEAD:  Acute large LEFT middle cerebral artery territory infarct without hemorrhagic conversion. Tubular susceptibility artifact LEFT cerebrum most compatible with thromboembolism distal LEFT MCA segment. Mild white matter changes compatible with chronic small vessel ischemic disease.   MRA HEAD:  LEFT internal carotid artery occlusion, recommend angiographic imaging in neck for further assessment. Slow flow within LEFT middle cerebral artery, retrograde filling via LEFT PCOM. Complete circle of Willis. Mild LEFT MCA luminal irregularity which suggests atherosclerosis.   CUS - Right: 40-59% ICA stenosis. Left: ICA is occluded. Bilateral: Vertebral artery flow is antegrade.   2D echo - - Left ventricle: The cavity size was normal. There was mild concentric hypertrophy. Systolic function was normal. The estimated ejection fraction was in the range of 60% to 65%. Wall motion was normal; there were no regional wall motion abnormalities. Left ventricular diastolic function parameters were normal. Lateral annulus E velocity:  11.4 cm/s. Medial annulus E velocity: 9.68 cm/s. - Aorta: Aortic root at upper normal limits of size. Aortic root dimension: 39 mm (ED). - Mitral valve: There was mild regurgitation. - Systemic veins: IVC dilated with normal respiratory variation. Estimated CVP 8 mmHg.  MRI lumbar spine - No significant lumbar spine disc protrusion,  foraminal stenosis or central canal stenosis.  Component     Latest Ref Rng 09/25/2014  Cholesterol     0 - 200 mg/dL 156  Triglycerides     <150 mg/dL 117  HDL Cholesterol     >40 mg/dL 31 (L)  Total CHOL/HDL Ratio      5.0  VLDL     0 - 40 mg/dL 23  LDL (calc)     0 - 99 mg/dL 102 (H)  Hemoglobin A1C     4.8 - 5.6 % 5.9 (H)  Mean Plasma Glucose      123    Assessment: As you may recall, he is a 60 y.o. Caucasian male with PMH of atrial fibrillation not on anticoagulation, basal cell carcinoma, and nephrolithiasis was admitted on 09/28/14 for large left MCA territory infarct. CTA showed left ICA occlusion with right ICA 65% stenosis. Carotid Doppler showed right ICA 40-59% stenosis and left ICA occlusion. LDL 102 and A1c 5.9. He was started on eliquis 5 mg bid and Lipitor 40 for stroke prevention. In terms of left ICA occlusion and right ICA 65% stenosis, vascular surgery was consulted, however, no intervention offered due to right ICA and less than 80% stenosis. Second opinion from Dr. Redmond Pulling at [facility] also recommend conservative measure for the right ICA stenosis. During the interval time, his speech much improved. 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 Letta Pate, on lyrica and tramadol. now off zanaflex. Not tolerating trileptal or cymbalta. BP better and not on BP meds. Recently diagnosed with right kidney stone, potential surgical candidate. Need CUS for evaluation. he sees Dr. Redmond Pulling in [place] too, he can also get CUS from there as he will have kidney stone surgery in [place].   Plan:  - continue eliquis and lipitor for stroke prevention - continue lyrica and tramadol for RLE pain - will repeat carotid ultrasound in preparation of potential kidney surgery. Can do it here or with Dr. Redmond Pulling in [place]. - Follow up with your primary care physician for stroke risk factor modification. Recommend maintain blood pressure goal around 120-150/80,  diabetes with hemoglobin A1c goal below 6.5% and lipids with LDL cholesterol goal below 70 mg/dL.  - follow up with cardiology for A. Fib and with Dr. Letta Pate - check BP at home and goal is 120-150 due to left ICA occlusion - continue speech therapy and self exercise  - follow up in 6 months.  I spent more than 25 minutes of face to face time with the patient. Greater than 50% of time was spent in counseling and coordination of care. We have discussed about BP management, repeat CUS for surgical clearance evaluation.   No orders of the defined types were placed in this encounter.   No orders of the defined types were placed in this encounter.   Patient Instructions  - continue eliquis and lipitor for stroke prevention - continue lyrica and tramadol for RLE pain - will repeat carotid ultrasound in preparation of potential kidney surgery. - right hand anti-allergy cream to help swelling and itchiness.  - Follow up with your primary care physician for stroke risk factor modification. Recommend  maintain blood pressure goal around 120-150/80, diabetes with hemoglobin A1c goal below 6.5% and lipids with LDL cholesterol goal below 70 mg/dL.  - follow up with cardiology for A. Fib and with Dr. Letta Pate - check BP at home and goal is 120-150 due to left ICA occlusion - continue speech therapy and self exercise  - follow up in 6 months.   Rosalin Hawking, MD PhD [facility] [address], [place], [place] [address] [phone]

## 2015-12-31 NOTE — Patient Instructions (Addendum)
-   continue eliquis and lipitor for stroke prevention - continue lyrica and tramadol for RLE pain - will repeat carotid ultrasound in preparation of potential kidney surgery. - right hand anti-allergy cream to help swelling and itchiness.  - Follow up with your primary care physician for stroke risk factor modification. Recommend maintain blood pressure goal around 120-150/80, diabetes with hemoglobin A1c goal below 6.5% and lipids with LDL cholesterol goal below 70 mg/dL.  - follow up with cardiology for A. Fib and with Dr. Letta Pate - check BP at home and goal is 120-150 due to left ICA occlusion - continue speech therapy and self exercise  - follow up in 6 months.

## 2016-01-04 ENCOUNTER — Other Ambulatory Visit: Payer: Self-pay | Admitting: Cardiology

## 2016-01-05 NOTE — Telephone Encounter (Signed)
[facility] a refill a metoprolol 25 mg tablet, taken 1/2 tablet daily. This medication is not on pt's med list and I do not see that this medication was D/C. Please advise if pt is suppose to still be taking this medication. Thanks

## 2016-01-10 ENCOUNTER — Telehealth: Payer: Self-pay | Admitting: Neurology

## 2016-01-10 NOTE — Telephone Encounter (Signed)
Wife called regarding scheduling Carotid US at [facility].

## 2016-01-12 NOTE — Telephone Encounter (Signed)
Called Patient's wife and left her a detailed message about Patient's apt. Patient 's apt at 2:00 01/14/2016 [id number] arrive at [place]. I relayed to wife arrive around 1:30. Patient insurance does not require a PA. Dr. Erlinda Hong cell  Phone relayed to Butch Penny for call report. Thanks Hinton Dyer .

## 2016-01-13 NOTE — Telephone Encounter (Signed)
According to the conversion with pt and wife last visit, they prefer to do the CUS with Dr. Redmond Pulling in [place]. In case that Dr. Redmond Pulling can not do it before his kidney surgery, he can have it done in [facility]. Please call the pt and his wife to see if they can do it with Dr. Redmond Pulling before his surgery. If it can be done in [place], we will cancel ours. If not, we will go ahead with our carotid ultrasound in [place]. Thanks.   Rosalin Hawking, MD PhD Stroke Neurology 01/13/2016 10:40 AM

## 2016-01-14 ENCOUNTER — Ambulatory Visit (HOSPITAL_COMMUNITY)
Admission: RE | Admit: 2016-01-14 | Discharge: 2016-01-14 | Disposition: A | Discharge status: Home / Self Care | Payer: BLUE CROSS/BLUE SHIELD | Source: Ambulatory Visit | Attending: Neurology | Admitting: Neurology

## 2016-01-14 DIAGNOSIS — I6521 Occlusion and stenosis of right carotid artery: Secondary | ICD-10-CM

## 2016-01-14 DIAGNOSIS — I6523 Occlusion and stenosis of bilateral carotid arteries: Secondary | ICD-10-CM | POA: Insufficient documentation

## 2016-01-14 LAB — VAS US CAROTID
LCCADDIAS: -7 cm/s
LEFT VERTEBRAL DIAS: -8 cm/s
Left CCA dist sys: -37 cm/s
Left CCA prox dias: 10 cm/s
Left CCA prox sys: 80 cm/s
RCCADSYS: -144 cm/s
RCCAPSYS: -56 cm/s
RIGHT ECA DIAS: -9 cm/s
RIGHT VERTEBRAL DIAS: -18 cm/s
Right CCA prox dias: -11 cm/s

## 2016-01-14 NOTE — Progress Notes (Signed)
**  Preliminary report by tech**  Carotid artery duplex complete. Findings are consistent with a 60 - 79 percent stenosis involving the right internal carotid artery.  Findings are consistent with an occluded left internal carotid artery. The vertebral arteries demonstrate antegrade flow.  01/14/16 2:50 PM [name] RVT

## 2016-01-18 ENCOUNTER — Telehealth: Payer: Self-pay | Admitting: Neurology

## 2016-01-18 NOTE — Telephone Encounter (Signed)
Called the pt at two phone numbers on file, but nobody was available over the phone. Left message for them to call back.   carotid doppler 01/14/16 [facility]) right ICA 60-79% stenosis with velocity 211/83 and left ICA occluded.                             04/08/15 [facility]) right ICA 50-69% stenosis with velocity 217/83 and left ICA occluded                            09/25/14 [facility]) right ICA 40-59% stenosis with velocity 150/45 and left ICA occluded  Although it seems right ICA more stenosis but likely due to different scale at different hospitals, but velocity unchanged since Feb. However, he should follow up with Dr. Redmond Pulling at [facility] before his kidney surgery regarding perioperative management.   If he calls back, please let him know the above. Thanks.   Rosalin Hawking, MD PhD Stroke Neurology 01/18/2016 5:34 PM

## 2016-01-19 ENCOUNTER — Encounter: Payer: Self-pay | Admitting: Neurology

## 2016-01-19 ENCOUNTER — Other Ambulatory Visit: Payer: Self-pay | Admitting: Cardiology

## 2016-01-19 NOTE — Telephone Encounter (Signed)
Pt's wife returned RN's call °

## 2016-01-19 NOTE — Telephone Encounter (Signed)
Patient's wife is returning a call.  

## 2016-01-19 NOTE — Telephone Encounter (Signed)
Pt's wife is requesting results letter be faxed to the [place]  Fax: [phone] Phone: [phone]

## 2016-01-19 NOTE — Telephone Encounter (Signed)
LFt vm to call about doppler results.

## 2016-01-19 NOTE — Telephone Encounter (Signed)
Pt's wife called back for results.

## 2016-01-19 NOTE — Telephone Encounter (Signed)
Talked with wife and relayed the result to her. I asked her to call Dr. Harrison Mons before pt kidney surgery in Dec. For further recommendations about stroke prevention in the setting of left ICA occlusion and right ICA 60-79% stenosis.    I also told wife that I am going to fax Dr. Redmond Pulling a letter including pt carotid doppler results, so that when wife talks with Dr. Redmond Pulling, he will understand what she is talking about. Wife expressed understanding and appreciation.   Hi, Katrina, please let the wife know that the letter has been faxed to [facility] neurosurgery department today at 4:39pm. Thanks.  Rosalin Hawking, MD PhD Stroke Neurology 01/19/2016 4:39 PM

## 2016-01-24 NOTE — Telephone Encounter (Signed)
Results fax to Neurosurgery at [phone].Fax receive and confirm.

## 2016-01-27 ENCOUNTER — Telehealth: Payer: Self-pay | Admitting: *Deleted

## 2016-01-27 NOTE — Telephone Encounter (Signed)
Pt Principle form Katrina desk.

## 2016-01-27 NOTE — Telephone Encounter (Signed)
RN receive disability forms on 01/25/2016, and the same form 01/27/2016.Pts release of information form has expired. Pt needs to sign another one.

## 2016-01-28 ENCOUNTER — Telehealth: Payer: Self-pay | Admitting: Neurology

## 2016-01-28 NOTE — Telephone Encounter (Signed)
Patient's wife requesting that the letter to Dr. Redmond Pulling be faxed to Dr Rex Kras at [phone].

## 2016-01-28 NOTE — Telephone Encounter (Signed)
Fax results of patients results on letter from Dr. Erlinda Hong to pts PCP. Fax was [phone]

## 2016-02-01 NOTE — Telephone Encounter (Signed)
Disability forms done by Dr. Erlinda Hong. PT needs a new release form, also payment is needed for the form. Pts disability form states they can pay up to 30.00 for the form. The family will have to pay 20.00 to make the payment of 50.00 dollars. Form cannot be fax until release form is done, and payment is set up. Sent to medical records.

## 2016-02-02 ENCOUNTER — Telehealth: Payer: Self-pay | Admitting: Cardiology

## 2016-02-02 ENCOUNTER — Telehealth: Payer: Self-pay | Admitting: Neurology

## 2016-02-02 NOTE — Telephone Encounter (Signed)
Request for surgical clearance:  1. What type of surgery is being performed? PCNL   2. When is this surgery scheduled? 02-08-16 -Could you send this asap please  3. Are there any medications that need to be held prior to surgery and how long?Can pt stop his Eliquis and if so how long?   4. Name of physician performing surgery? Dr Kenna Gilbert   5. What is your office phone and fax number? [phone] and fax number is [phone]

## 2016-02-02 NOTE — Telephone Encounter (Signed)
Pt's wife called says he's having kidney stone surgery 02/08/16. When he went to pre-op at [facility] she was advised he will need clearance to stop apixaban (ELIQUIS) 5 MG TABS tablet . Please call Erin/Dr [facility] [phone]. Thank you

## 2016-02-02 NOTE — Telephone Encounter (Signed)
Talked with pt wife over the phone. She tried to contact Dr. Redmond Pulling in [facility] but was told to contact PCP for surgery clearance. PCP directed her to cardiology then. Anyway, she talked with anesthesiologist and recommended to stop eliquis 3 days before the surgery and bridge with ASA 81mg . I will give Dr. Danise Mina a call tomorrow.   Rosalin Hawking, MD PhD Stroke Neurology 02/02/2016 5:35 PM

## 2016-02-02 NOTE — Telephone Encounter (Signed)
Called pt.

## 2016-02-02 NOTE — Telephone Encounter (Signed)
See clearance request.

## 2016-02-03 DIAGNOSIS — Z0289 Encounter for other administrative examinations: Secondary | ICD-10-CM

## 2016-02-03 NOTE — Telephone Encounter (Signed)
Duplicate, see below  Artesian @[facility]  [facility] calling again re: surgical clearnace  Please follow up and call back  [phone]  [phone]

## 2016-02-03 NOTE — Telephone Encounter (Signed)
What does PCNL stand for?

## 2016-02-03 NOTE — Telephone Encounter (Signed)
Called the number and discussed with Erin at [facility] urology department. They still waiting for clearance from cardiology for the incoming surgery. I briefly discussed with Junie Panning about neurology clearance and I left my cell phone number to her in case Dr. Danise Mina needs to call me.   Rosalin Hawking, MD PhD Stroke Neurology 02/03/2016 3:23 PM

## 2016-02-04 NOTE — Telephone Encounter (Signed)
Not too familiar with the bleed risk of this particular procedure, but would see if the MD is comfortable holding Eliquis for no more than 24 hours and resuming ASAP post op given pt's history of stroke.

## 2016-02-04 NOTE — Telephone Encounter (Signed)
Attempted to call [name]'s number several times in the last 2 hours, but received automated message that due to network difficulties the call could not be completed.  Will try again later.

## 2016-02-04 NOTE — Telephone Encounter (Signed)
02-04-16 258pm Lattie Haw calling from Dr. Danise Mina office to check on status of clearance, per Sammuel Bailiff she will notify both the office and the patient once clearance has been done-Lisa asking to please call her at [phone]

## 2016-02-04 NOTE — Telephone Encounter (Signed)
Confirmed with office that PCNL is percutaneous nephrolithotomy.

## 2016-02-04 NOTE — Telephone Encounter (Signed)
Left message for patient that as soon as Dr. Radford Pax completes the clearance, both he and his doctor will be notified. Instructed him to call if he has any other questions or concerns prior.

## 2016-02-04 NOTE — Telephone Encounter (Signed)
Please talk with Fuller Canada, PharmD as patient is on NOAC but has a history of CVA.  Needs to be off the least amount of time possible with NOAC

## 2016-02-04 NOTE — Telephone Encounter (Signed)
Pt wife is calling to get instructions for his medical clearance

## 2016-02-07 ENCOUNTER — Telehealth: Payer: Self-pay

## 2016-02-07 ENCOUNTER — Telehealth: Payer: Self-pay | Admitting: *Deleted

## 2016-02-07 NOTE — Telephone Encounter (Signed)
Informed [name] that Per Dr. Radford Pax and [facility], the patient will need to be off NOAC the least amount of time possible - no more than 24 hours.   Faxed to [phone]

## 2016-02-07 NOTE — Telephone Encounter (Signed)
Patients' wife called stating she never heard back from Dr Erlinda Hong that he had spoken to Dr Danise Mina re: medication prior to patient's surgery.  Advised her per Dr Phoebe Sharps note on 02/03/16 he spoke with Junie Panning, [facility] about medication from neurological standpoint. Dr Erlinda Hong gave Junie Panning his cell # in case Dr Danise Mina needed to call him. Patient's wife stated she spoke to Dr Gutierrez's office and was told to take him off Eliquis and give him baby Aspirin. She stated he has been off Eliquis and taking baby ASA since Sat.  Offered to send this note to Dr Erlinda Hong; she declined. She stated Patient's surgery is tomorrow.

## 2016-02-07 NOTE — Telephone Encounter (Signed)
Called wife and clarified everything with her. She thanked me for the call.  Rosalin Hawking, MD PhD Stroke Neurology 02/07/2016 3:55 PM

## 2016-02-07 NOTE — Telephone Encounter (Signed)
Follow up    [place] verbalized that she is calling to speak to rn about the surgical clearance request

## 2016-02-08 DIAGNOSIS — N200 Calculus of kidney: Secondary | ICD-10-CM | POA: Insufficient documentation

## 2016-02-14 ENCOUNTER — Other Ambulatory Visit: Payer: Self-pay | Admitting: Cardiology

## 2016-02-24 NOTE — Telephone Encounter (Signed)
error 

## 2016-03-13 ENCOUNTER — Telehealth: Payer: Self-pay

## 2016-03-13 NOTE — Telephone Encounter (Signed)
Manuela Schwartz (wife) has called and patient needs a prior authorization for medication Lyrica.  Pharmacy [facility]

## 2016-03-17 NOTE — Telephone Encounter (Signed)
We are at a maximum dose of Lyrica already. Tramadol may be increased to 2 tablets 4 times a day. If the patient would like to try this. If it doesn't work, we'll need an appointment to see me.

## 2016-03-17 NOTE — Telephone Encounter (Signed)
Prior authorization completed and approved....Marland KitchenMarland KitchenAlso.Marland KitchenMarland KitchenMarland KitchenI spoke with the patients wife.  She says her husband is having increased pain in his legs.  She is asking if medication or dosing can be changed?  Should she move up appointment to see Dr. Letta Pate sooner? Next appointment is currently scheduled for 06/02/2016

## 2016-03-17 NOTE — Telephone Encounter (Signed)
Contacted patients wife and notified that PA was approved for Lyrica.  I passed on Dr. Letta Pate suggestion for pt's increased pain.  They will call and move up appointment if pain persists

## 2016-03-21 ENCOUNTER — Telehealth: Payer: Self-pay | Admitting: Neurology

## 2016-03-21 NOTE — Telephone Encounter (Signed)
Patients wife called in reference to getting a letter to excuse patient from jury duty 04/24/16.  Letter needs to state medical condition and how it is a problem and that patient needs to be excused.

## 2016-03-22 ENCOUNTER — Encounter: Payer: Self-pay | Admitting: Neurology

## 2016-03-22 NOTE — Telephone Encounter (Signed)
Rn call patients wife back [name] about the jury letter for pt to be excused. Rn stated a copy can be sent to home address. [name] request a copy be fax to [phone] her work fax. Letter put in mail, and fax to [phone].

## 2016-04-11 ENCOUNTER — Other Ambulatory Visit: Payer: Self-pay

## 2016-04-11 ENCOUNTER — Telehealth: Payer: Self-pay

## 2016-04-11 MED ORDER — TRAMADOL HCL 50 MG PO TABS
100.0000 mg | ORAL_TABLET | Freq: Four times a day (QID) | ORAL | 1 refills | Status: DC
Start: 2016-04-11 — End: 2016-04-11

## 2016-04-11 MED ORDER — TRAMADOL HCL 50 MG PO TABS: 100.0000 mg | ORAL_TABLET | Freq: Four times a day (QID) | ORAL | 1 refills | Status: DC

## 2016-04-11 NOTE — Telephone Encounter (Signed)
Changed prescription of tramadol to match phone message of increasing it to 2 tab 4 times a day

## 2016-05-03 ENCOUNTER — Other Ambulatory Visit: Payer: Self-pay | Admitting: *Deleted

## 2016-05-03 MED ORDER — PREGABALIN 150 MG PO CAPS: 300.0000 mg | ORAL_CAPSULE | Freq: Two times a day (BID) | ORAL | 1 refills | Status: DC

## 2016-05-25 ENCOUNTER — Telehealth: Payer: Self-pay | Admitting: Cardiology

## 2016-05-25 NOTE — Telephone Encounter (Signed)
If pt is needing this PA soon please address as I have been in Triage all week and will not officially start PA position until Tuesday 4/2. Thanks.

## 2016-05-25 NOTE — Telephone Encounter (Signed)
New Message   Pt c/o medication issue:  1. Name of Medication:  apixaban (ELIQUIS) 5 MG TABS tablet 2. How are you currently taking this medication (dosage and times per day)? As prescribed  3. Are you having a reaction (difficulty breathing--STAT)? No  4. What is your medication issue? Per wife prior authorization is needed for prescription, and they are requesting to speak with doctors office.  Pt wife is requesting a call back

## 2016-05-25 NOTE — Telephone Encounter (Signed)
Spoke with patient's wife.  Pt has appointment with Dr. Radford Pax on 4/4.  Was informed by [facility] a new prior Josem Kaufmann is needed for Eliquis before they send further refills.   Will review with Jeani Hawking in prior auths.  They are going out of town for 2 weeks after his appointment and want to be sure they have enough medication.

## 2016-05-31 ENCOUNTER — Ambulatory Visit (INDEPENDENT_AMBULATORY_CARE_PROVIDER_SITE_OTHER): Payer: BLUE CROSS/BLUE SHIELD | Admitting: Cardiology

## 2016-05-31 ENCOUNTER — Ambulatory Visit: Payer: BLUE CROSS/BLUE SHIELD | Admitting: Cardiology

## 2016-05-31 ENCOUNTER — Encounter: Payer: Self-pay | Admitting: Cardiology

## 2016-05-31 VITALS — BP 130/68 | HR 72 | Ht 74.0 in | Wt 203.0 lb

## 2016-05-31 DIAGNOSIS — I77810 Thoracic aortic ectasia: Secondary | ICD-10-CM | POA: Diagnosis not present

## 2016-05-31 DIAGNOSIS — E785 Hyperlipidemia, unspecified: Secondary | ICD-10-CM

## 2016-05-31 DIAGNOSIS — I6523 Occlusion and stenosis of bilateral carotid arteries: Secondary | ICD-10-CM | POA: Diagnosis not present

## 2016-05-31 DIAGNOSIS — I481 Persistent atrial fibrillation: Secondary | ICD-10-CM | POA: Diagnosis not present

## 2016-05-31 DIAGNOSIS — I6529 Occlusion and stenosis of unspecified carotid artery: Secondary | ICD-10-CM | POA: Insufficient documentation

## 2016-05-31 DIAGNOSIS — I4819 Other persistent atrial fibrillation: Secondary | ICD-10-CM

## 2016-05-31 HISTORY — DX: Hyperlipidemia, unspecified: E78.5

## 2016-05-31 MED ORDER — APIXABAN 5 MG PO TABS: 5.0000 mg | ORAL_TABLET | Freq: Two times a day (BID) | ORAL | 3 refills | Status: DC

## 2016-05-31 NOTE — Patient Instructions (Signed)
Medication Instructions:  Your physician recommends that you continue on your current medications as directed. Please refer to the Current Medication list given to you today.   Labwork: Your physician recommends that you return for lab work in: 3 weeks    Testing/Procedures: none  Follow-Up: Your physician wants you to follow-up in: 6 months with Dr. Radford Pax. You will receive a reminder letter in the mail two months in advance. If you don't receive a letter, please call our office to schedule the follow-up appointment.   Any Other Special Instructions Will Be Listed Below (If Applicable).     If you need a refill on your cardiac medications before your next appointment, please call your pharmacy.

## 2016-05-31 NOTE — Progress Notes (Signed)
Cardiology Office Note    Date:  05/31/2016   ID:  [name], DOB 12-16-1955, MRN [id number]  PCP:  Gennette Pac, MD  Cardiologist:  Fransico Him, MD   Chief Complaint  Patient presents with  . Atrial Fibrillation  . Hyperlipidemia    History of Present Illness:  [name] is a 61 y.o. male  with a history of PAF with CHADS2VASC score 2 (CVA) on Apixaban who presents today for followup.   He also has a history of f dilated aortic root by echo which also showed normal LVF. He presents back today for followup and is doing very well. He denies any chest pain, SOB, DOE, PND, orthopnea, palpitations, syncope, dizziness or LE edema.    Past Medical History:  Diagnosis Date  . Basal cell carcinoma   . Bradycardia 10/21/2015  . Carotid artery stenosis    60-70% right and occluded left ICA stenosis  . Dilated aortic root ([place]) 10/20/2015  . Hyperlipidemia LDL goal <70 05/31/2016  . Nephrolithiasis   . Neuropathy ([place])   . Persistent atrial fibrillation ([facility])    CHADS2VASC score is 2 (hx of CVA)  . Stroke [facility])     Past Surgical History:  Procedure Laterality Date  . LITHOTRIPSY    . VASECTOMY      Current Medications: Current Meds  Medication Sig  . apixaban (ELIQUIS) 5 MG TABS tablet Take 1 tablet (5 mg total) by mouth 2 (two) times daily.  Marland Kitchen atorvastatin (LIPITOR) 40 MG tablet TAKE 1 TABLET DAILY AT 6 P.M.  . levothyroxine (SYNTHROID, LEVOTHROID) 25 MCG tablet Take 25 mcg by mouth daily before breakfast.   . pregabalin (LYRICA) 150 MG capsule Take 2 capsules (300 mg total) by mouth 2 (two) times daily.  Marland Kitchen senna (SENOKOT) 8.6 MG tablet Take 1 tablet by mouth as needed for constipation. Bedtime  . senna (SENOKOT) 8.6 MG TABS tablet Take 1 tablet by mouth.  . traMADol (ULTRAM) 50 MG tablet Take 2 tablets (100 mg total) by mouth 4 (four) times daily.    Allergies:   Patient has no known allergies.   Social History   Social History  . Marital  status: Married    Spouse name: N/A  . Number of children: N/A  . Years of education: N/A   Social History Main Topics  . Smoking status: Never Smoker  . Smokeless tobacco: Never Used  . Alcohol use 0.6 oz/week    1 Cans of beer per week     Comment: occasionally maybe the weekends  . Drug use: No  . Sexual activity: Not Asked   Other Topics Concern  . None   Social History Narrative  . None     Family History:  The patient's family history includes Cancer in his mother; Heart failure in his father.   ROS:   Please see the history of present illness.    ROS All other systems reviewed and are negative.  No flowsheet data found.     PHYSICAL EXAM:   VS:  BP 130/68   Pulse 72   Ht 6\' 2"  (1.88 m)   Wt 203 lb (92.1 kg)   SpO2 95%   BMI 26.06 kg/m    GEN: Well nourished, well developed, in no acute distress  HEENT: normal  Neck: no JVD, carotid bruits, or masses Cardiac: RRR; no murmurs, rubs, or gallops,no edema.  Intact distal pulses bilaterally.  Respiratory:  clear to auscultation bilaterally, normal work of  breathing GI: soft, nontender, nondistended, + BS MS: no deformity or atrophy  Skin: warm and dry, no rash Neuro:  Alert and Oriented x 3, Strength and sensation are intact Psych: euthymic mood, full affect  Wt Readings from Last 3 Encounters:  05/31/16 203 lb (92.1 kg)  12/31/15 208 lb (94.3 kg)  10/21/15 199 lb (90.3 kg)      Studies/Labs Reviewed:   EKG:  EKG is  Not ordered today.   Recent Labs: 10/21/2015: BUN 12; Creat 0.93; Hemoglobin 14.2; Platelets 240; Potassium 4.6; Sodium 143   Lipid Panel    Component Value Date/Time   CHOL 156 09/25/2014 0400   TRIG 117 09/25/2014 0400   HDL 31 (L) 09/25/2014 0400   CHOLHDL 5.0 09/25/2014 0400   VLDL 23 09/25/2014 0400   LDLCALC 102 (H) 09/25/2014 0400    Additional studies/ records that were reviewed today include:  none    ASSESSMENT:    1. Persistent atrial fibrillation ([place])   2.  Dilated aortic root ([place])   3. Bilateral carotid artery stenosis   4. Hyperlipidemia LDL goal <70      PLAN:  In order of problems listed above:  1. Persistent atrial fibrillation maintaining NSR.  He will continue on Apixaban.  His BB was stopped by his PCP due to low BP and HR.   2. Dilated aortic root - 5mm by echo 09/2015. I will repeat an echo 09/2016 to make sure it does not progress.  3. Bilateral carotid stenosis with 60-79% right and occluded left carotid.  He is going to have a right CEA next month at [facility].  He will continue on statin therapy.   4. Hyperlipidemia with LDL < 70.  He will continue on statin therapy.  I will get an FLP and ALT.     Medication Adjustments/Labs and Tests Ordered: Current medicines are reviewed at length with the patient today.  Concerns regarding medicines are outlined above.  Medication changes, Labs and Tests ordered today are listed in the Patient Instructions below.  There are no Patient Instructions on file for this visit.   Signed, Fransico Him, MD  05/31/2016 12:04 PM    [address] [place], [place], [place]  [address] Phone: [phone]; Fax: [phone]

## 2016-06-01 ENCOUNTER — Telehealth: Payer: Self-pay

## 2016-06-01 ENCOUNTER — Other Ambulatory Visit: Payer: Self-pay

## 2016-06-01 MED ORDER — APIXABAN 5 MG PO TABS: 5.0000 mg | ORAL_TABLET | Freq: Two times a day (BID) | ORAL | 3 refills | Status: DC

## 2016-06-01 NOTE — Telephone Encounter (Signed)
**Note De-Identified [name]** I did PA for Eliquis over the phone with [facility] and received an approval. [id number]   Approval from 05/02/16-06/01/17.  The pts wife [name] is aware.

## 2016-06-02 ENCOUNTER — Encounter: Payer: Self-pay | Admitting: Physical Medicine & Rehabilitation

## 2016-06-02 ENCOUNTER — Encounter: Payer: BLUE CROSS/BLUE SHIELD | Attending: Physical Medicine & Rehabilitation

## 2016-06-02 ENCOUNTER — Ambulatory Visit (HOSPITAL_BASED_OUTPATIENT_CLINIC_OR_DEPARTMENT_OTHER): Payer: BLUE CROSS/BLUE SHIELD | Admitting: Physical Medicine & Rehabilitation

## 2016-06-02 VITALS — BP 105/71 | HR 67 | Resp 14

## 2016-06-02 DIAGNOSIS — I69320 Aphasia following cerebral infarction: Secondary | ICD-10-CM | POA: Insufficient documentation

## 2016-06-02 DIAGNOSIS — I639 Cerebral infarction, unspecified: Secondary | ICD-10-CM | POA: Diagnosis not present

## 2016-06-02 DIAGNOSIS — I481 Persistent atrial fibrillation: Secondary | ICD-10-CM | POA: Diagnosis not present

## 2016-06-02 DIAGNOSIS — I6523 Occlusion and stenosis of bilateral carotid arteries: Secondary | ICD-10-CM | POA: Diagnosis not present

## 2016-06-02 DIAGNOSIS — G890 Central pain syndrome: Secondary | ICD-10-CM | POA: Insufficient documentation

## 2016-06-02 DIAGNOSIS — Z8249 Family history of ischemic heart disease and other diseases of the circulatory system: Secondary | ICD-10-CM | POA: Insufficient documentation

## 2016-06-02 DIAGNOSIS — Z9852 Vasectomy status: Secondary | ICD-10-CM | POA: Insufficient documentation

## 2016-06-02 DIAGNOSIS — Z87442 Personal history of urinary calculi: Secondary | ICD-10-CM | POA: Diagnosis not present

## 2016-06-02 DIAGNOSIS — Z79891 Long term (current) use of opiate analgesic: Secondary | ICD-10-CM | POA: Diagnosis not present

## 2016-06-02 DIAGNOSIS — IMO0002 Reserved for concepts with insufficient information to code with codable children: Secondary | ICD-10-CM

## 2016-06-02 DIAGNOSIS — R4701 Aphasia: Secondary | ICD-10-CM | POA: Diagnosis not present

## 2016-06-02 DIAGNOSIS — E785 Hyperlipidemia, unspecified: Secondary | ICD-10-CM | POA: Insufficient documentation

## 2016-06-02 DIAGNOSIS — Z809 Family history of malignant neoplasm, unspecified: Secondary | ICD-10-CM | POA: Diagnosis not present

## 2016-06-02 DIAGNOSIS — Z85828 Personal history of other malignant neoplasm of skin: Secondary | ICD-10-CM | POA: Insufficient documentation

## 2016-06-02 MED ORDER — TRAMADOL HCL ER 300 MG PO TB24: 300.0000 mg | ORAL_TABLET | Freq: Every day | ORAL | 0 refills | Status: DC

## 2016-06-02 NOTE — Patient Instructions (Addendum)
Please keep up with walking exercise Semimembranosus Tendinitis Rehab Ask your health care provider which exercises are safe for you. Do exercises exactly as told by your health care provider and adjust them as directed. It is normal to feel mild stretching, pulling, tightness, or discomfort as you do these exercises, but you should stop right away if you feel sudden pain or your pain gets worse.Do not begin these exercises until told by your health care provider. Stretching and range of motion exercises These exercises warm up your muscles and joints and improve the movement and flexibility of your thigh. These exercises also help to relieve pain, numbness, and tingling. Exercise A: Hamstring stretch, supine   1. Lie on your back. Loop a belt or towel across the ball of your left / right foot The ball of your foot is on the walking surface, right under your toes. 2. Straighten your left / right knee and slowly pull on the belt to raise your leg. Stop when you feel a gentle stretch behind your left / right knee or thigh.  Do not allow the knee to bend.  Keep your other leg flat on the floor. 3. Hold this position for __________ seconds. Repeat __________ times. Complete this exercise __________ times a day. Strengthening exercises These exercises build strength and endurance in your thigh. Endurance is the ability to use your muscles for a long time, even after they get tired. Exercise B: Straight leg raises (  hip extensors) 1. Lie on your belly on a bed or a firm surface with a pillow under your hips. 2. Bend your left / right knee so your foot is straight up in the air. 3. Squeeze your buttock muscles and lift your left / right thigh off the bed. Do not let your back arch. 4. Hold this position for __________seconds. 5. Slowly return to the starting position. Let your muscles relax completely before you do another repetition. Repeat __________ times. Complete this exercise __________  times a day. Exercise C: Bridge ( hip extensors) 1. Lie on your back on a firm surface with your knees bent and your feet flat on the floor. 2. Tighten your buttocks muscles and lift your bottom off the floor until your trunk is level with your thighs.  You should feel the muscles working in your buttocks and the back of your thighs. If you do not feel these muscles, slide your feet 1-2 inches (2.5-5 cm) farther away from your buttocks.  Do not arch your back. 3. Hold this position for __________ seconds. 4. Slowly lower your hips to the starting position. 5. Let your buttocks muscles relax completely between repetitions. If this exercise is too easy, try doing it with your arms crossed over your chest. Repeat __________ times. Complete this exercise __________ times a day. Exercise D: Hamstring eccentric, prone 1. Lie on your belly on a bed or on the floor. 2. Start with your legs straight. Cross your legs at the ankles with your left / right leg on top. 3. Using your bottom leg to do the work, bend both knees. 4. Using just your left / right leg alone, slowly lower your leg back down toward the bed. Add a __________ weight as told by your health care provider. 5. Let your muscles relax completely between repetitions. Repeat __________ times. Complete this exercise __________ times a day. Exercise E: Squats 1. Stand in front of a table, with your feet and knees pointing straight ahead. You may rest your hands  on the table for balance but not for support. 2. Slowly bend your knees and lower your hips like you are going to sit in a chair. Keep your thighs straight or pointed slightly outward.  Keep your weight over your heels, not over your toes.  Keep your lower legs upright so they are parallel with the table legs.  Do not let your hips go lower than your knees. Stop when your knees are bent to the shape of an upside-down letter "L" (90 degree angle).  Do not bend lower than told by  your health care provider.  If your knee pain increases, do not bend as low. 3. Hold the squat position __________ seconds. 4. Slowly push with your legs to return to standing. Do not use your hands to pull yourself to standing. Repeat __________ times. Complete this exercise __________ times a day. This information is not intended to replace advice given to you by your health care provider. Make sure you discuss any questions you have with your health care provider. Document Released: 02/13/2005 Document Revised: 10/21/2015 Document Reviewed: 11/17/2014 Elsevier Interactive Patient Education  2017 [place].  Tapentadol extended-release tablets What is this medicine? TAPENTADOL (ta PEN ta dol) is a pain reliever. It is used to treat moderate to severe pain that lasts for more than a few days. It is also used to treat nerve pain caused by diabetes. This medicine may be used for other purposes; ask your health care provider or pharmacist if you have questions. COMMON BRAND NAME(S): Nucynta ER What should I tell my health care provider before I take this medicine? They need to know if you have any of these conditions: -Addison's disease -gallbladder disease -head injury -history of a drug or alcohol abuse problem -if you often drink alcohol -kidney disease -liver disease -low blood pressure -lung or breathing disease, like asthma -mental illness -prostate disease -seizures -stomach or intestine problems -thyroid disease -an unusual or allergic reaction to tapentadol, other medicines, foods, dyes, or preservatives -pregnant or trying to get pregnant -breast-feeding How should I use this medicine? Take this medicine by mouth with a glass of water. Do not cut, crush, or chew this medicine. Do not take a tablet that is not whole. A broken or crushed tablet can be very dangerous. You may get too much medicine. Swallow only one tablet at a time. Do not wet, soak, or lick the tablet  before you take it. You can take it with or without food. If it upsets your stomach, take it with food. Follow the directions on the prescription label. Take your medicine at regular intervals. Do not take it more often than directed. Do not stop taking except on your doctor's advice. A special MedGuide will be given to you by the pharmacist with each prescription and refill. Be sure to read this information carefully each time. Talk to your pediatrician regarding the use of this medicine in children. Special care may be needed. Overdosage: If you think you have taken too much of this medicine contact a poison control center or emergency room at once. NOTE: This medicine is only for you. Do not share this medicine with others. What if I miss a dose? If you miss a dose, take it as soon as you can. If it is almost time for your next dose, take only that dose. Do not take double or extra doses. What may interact with this medicine? Do not take this medicine with any of the following medications: -MAOIs  like Carbex, Eldepryl, Marplan, Nardil, and Parnate This medicine may also interact with the following medications: -alcohol or any product that contains alcohol -antihistamines for allergy, cough and cold -atropine -certain medicines for anxiety or sleep -certain medicines for bladder problems like oxybutynin, tolterodine -certain medicines for depression like amitriptyline, fluoxetine, sertraline -certain medicines for migraine headache like almotriptan, eletriptan, frovatriptan, naratriptan, rizatriptan, sumatriptan, zolmitriptan -certain medicines for Parkinson's disease like benztropine, trihexyphenidyl -certain medicines for seizures like phenobarbital, primidone -certain medicines for stomach problems like dicyclomine, hyoscyamine -certain medicines for travel sickness like scopolamine -general anesthetics like halothane, isoflurane, methoxyflurane, propofol -ipratropium -local anesthetics  like lidocaine, pramoxine, tetracaine -medicines that relax muscles for surgery -other narcotic medicines for pain or cough -phenothiazines like chlorpromazine, mesoridazine, prochlorperazine, thioridazine This list may not describe all possible interactions. Give your health care provider a list of all the medicines, herbs, non-prescription drugs, or dietary supplements you use. Also tell them if you smoke, drink alcohol, or use illegal drugs. Some items may interact with your medicine. What should I watch for while using this medicine? Tell your doctor or health care professional if your pain does not go away, if it gets worse, or if you have new or a different type of pain. You may develop tolerance to the medicine. Tolerance means that you will need a higher dose of the medicine for pain relief. Tolerance is normal and is expected if you take the medicine for a long time. Do not suddenly stop taking your medicine because you may develop a severe reaction. Your body becomes used to the medicine. This does NOT mean you are addicted. Addiction is a behavior related to getting and using a drug for a non-medical reason. If you have pain, you have a medical reason to take pain medicine. Your doctor will tell you how much medicine to take. If your doctor wants you to stop the medicine, the dose will be slowly lowered over time to avoid any side effects. There are different types of narcotic medicines (opiates). If you take more than one type at the same time or if you are taking another medicine that also causes drowsiness, you may have more side effects. Give your health care provider a list of all medicines you use. Your doctor will tell you how much medicine to take. Do not take more medicine than directed. Call emergency for help if you have problems breathing or unusual sleepiness. You may get drowsy or dizzy. Do not drive, use machinery, or do anything that needs mental alertness until you know how this  medicine affects you. Do not stand or sit up quickly, especially if you are an older patient. This reduces the risk of dizzy or fainting spells. Alcohol may interfere with the effect of this medicine. Avoid alcoholic drinks. This medicine will cause constipation. Try to have a bowel movement at least every 2 to 3 days. If you do not have a bowel movement for 3 days, call your doctor or health care professional. Your mouth may get dry. Chewing sugarless gum or sucking hand candy, and drinking plenty of water may help. Contact your doctor if the problem does not go away or is severe. What side effects may I notice from receiving this medicine? Side effects that you should report to your doctor or health care professional as soon as possible: -allergic reactions like skin rash, itching or hives, swelling of the face, lips, or tongue -breathing problems -confusion -seizures -signs and symptoms of low blood pressure like dizziness;  feeling faint or lightheaded, falls; unusually weak or tired -trouble passing urine or change in the amount of urine Side effects that usually do not require medical attention (report to your doctor or health care professional if they continue or are bothersome): -constipation -dry mouth -nausea, vomiting -tiredness This list may not describe all possible side effects. Call your doctor for medical advice about side effects. You may report side effects to FDA at 1-800-FDA-1088. Where should I keep my medicine? Keep out of the reach of children. This medicine can be abused. Keep your medicine in a safe place to protect it from theft. Do not share this medicine with anyone. Selling or giving away this medicine is dangerous and against the law. Store at room temperature between 15 and 30 degrees C (59 and 86 degrees F). Protect from moisture. This medicine may cause accidental overdose and death if it is taken by other adults, children, or pets. Flush any unused medicine down  the toilet to reduce the chance of harm. Do not use the medicine after the expiration date. NOTE: This sheet is a summary. It may not cover all possible information. If you have questions about this medicine, talk to your doctor, pharmacist, or health care provider.  2018 Elsevier/Gold Standard (2015-03-18 11:34:44)

## 2016-06-02 NOTE — Progress Notes (Deleted)
   Subjective:    Patient ID: [name], male    DOB: 12/28/1955, 61 y.o.   MRN: [id number]  HPI   Pain Inventory Average Pain {NUMBERS; 0-10:5044} Pain Right Now {NUMBERS; 0-10:5044} My pain is {PAIN DESCRIPTION:21022940}  In the last 24 hours, has pain interfered with the following? General activity {NUMBERS; 0-10:5044} Relation with others {NUMBERS; 0-10:5044} Enjoyment of life {NUMBERS; 0-10:5044} What TIME of day is your pain at its worst? {TIME OF XTK:24097353} Sleep (in general) {BHH GOOD/FAIR/POOR:22877}  Pain is worse with: {ACTIVITIES:21022942} Pain improves with: {PAIN IMPROVES GDJM:42683419} Relief from Meds: {NUMBERS; 0-10:5044}  Mobility {MOBILITY QQI:29798921}  Function {FUNCTION:21022946}  Neuro/Psych {NEURO/PSYCH:21022948}  Prior Studies {CPRM PRIOR STUDIES:21022953}  Physicians involved in your care {CPRM PHYSICIANS INVOLVED IN YOUR CARE:21022954}   Family History  Problem Relation Age of Onset  . Cancer Mother   . Heart failure Father    Social History   Social History  . Marital status: Married    Spouse name: N/A  . Number of children: N/A  . Years of education: N/A   Social History Main Topics  . Smoking status: Never Smoker  . Smokeless tobacco: Never Used  . Alcohol use 0.6 oz/week    1 Cans of beer per week     Comment: occasionally maybe the weekends  . Drug use: No  . Sexual activity: Not Asked   Other Topics Concern  . None   Social History Narrative  . None   Past Surgical History:  Procedure Laterality Date  . LITHOTRIPSY    . VASECTOMY     Past Medical History:  Diagnosis Date  . Basal cell carcinoma   . Bradycardia 10/21/2015  . Carotid artery stenosis    60-70% right and occluded left ICA stenosis  . Dilated aortic root ([place]) 10/20/2015  . Hyperlipidemia LDL goal <70 05/31/2016  . Nephrolithiasis   . Neuropathy ([place])   . Persistent atrial fibrillation (HCC)    CHADS2VASC score is 2 (hx of CVA)  .  Stroke ([place])    BP 105/71 (BP Location: Right Arm, Patient Position: Sitting, Cuff Size: Normal)   Pulse 67   Resp 14   SpO2 97%   Opioid Risk Score:   Fall Risk Score:  `1  Depression screen PHQ 2/9  Depression screen [facility] 2/9 06/04/2015 10/23/2014  Decreased Interest 0 0  Down, Depressed, Hopeless 0 0  PHQ - 2 Score 0 0  Altered sleeping - 3  Tired, decreased energy - 3  Change in appetite - 0  Feeling bad or failure about yourself  - 0  Trouble concentrating - 0  Moving slowly or fidgety/restless - 0  Suicidal thoughts - 0  PHQ-9 Score - 6  Difficult doing work/chores - Not difficult at all    Review of Systems     Objective:   Physical Exam        Assessment & Plan:

## 2016-06-02 NOTE — Progress Notes (Signed)
Subjective:    Patient ID: [name], male    DOB: 02-15-1956, 61 y.o.   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 He has left lower extremity pain greater than left upper extremity pain.  Lyrica 300mg  BID Tramadol 100mg  QID  Right hemiparesis and aphasia persists While pain relief has increased since increasing her tramadol to 4 times a day, has had increasing drowsiness. We discussed tramadol ER as a potential treatment option.  We also discussed Nucynta ER  Patient sometimes feels tight in the legs Pain Inventory Average Pain 7 Pain Right Now 7 My pain is aching  In the last 24 hours, has pain interfered with the following? General activity 8 Relation with others 5 Enjoyment of life 8 What TIME of day is your pain at its worst? morning, daytime, evening Sleep (in general) Good  Pain is worse with: walking, bending and inactivity Pain improves with: rest and medication Relief from Meds: 5  Mobility walk without assistance  Function disabled: date disabled .  Neuro/Psych numbness  Prior Studies Any changes since last visit?  no  Physicians involved in your care Any changes since last visit?  no   Family History  Problem Relation Age of Onset  . Cancer Mother   . Heart failure Father    Social History   Social History  . Marital status: Married    Spouse name: N/A  . Number of children: N/A  . Years of education: N/A   Social History Main Topics  . Smoking status: Never Smoker  . Smokeless tobacco: Never Used  . Alcohol use 0.6 oz/week    1 Cans of beer per week     Comment: occasionally maybe the weekends  . Drug use: No  . Sexual activity: Not Asked   Other Topics Concern  . None   Social History Narrative  . None   Past Surgical History:  Procedure Laterality Date  . LITHOTRIPSY    . VASECTOMY      Past Medical History:  Diagnosis Date  . Basal cell carcinoma   . Bradycardia 10/21/2015  . Carotid artery stenosis    60-70% right and occluded left ICA stenosis  . Dilated aortic root ([place]) 10/20/2015  . Hyperlipidemia LDL goal <70 05/31/2016  . Nephrolithiasis   . Neuropathy ([place])   . Persistent atrial fibrillation (HCC)    CHADS2VASC score is 2 (hx of CVA)  . Stroke ([place])    BP 105/71 (BP Location: Right Arm, Patient Position: Sitting, Cuff Size: Normal)   Pulse 67   Resp 14   SpO2 97%   Opioid Risk Score:   Fall Risk Score:  `1  Depression screen PHQ 2/9  Depression screen [facility] 2/9 06/04/2015 10/23/2014  Decreased Interest 0 0  Down, Depressed, Hopeless 0 0  PHQ - 2 Score 0 0  Altered sleeping - 3  Tired, decreased energy - 3  Change in appetite - 0  Feeling bad or failure about yourself  - 0  Trouble concentrating - 0  Moving slowly or fidgety/restless - 0  Suicidal thoughts - 0  PHQ-9 Score - 6  Difficult doing work/chores - Not difficult at all    Review of Systems  Constitutional: Negative.   HENT: Negative.   Eyes: Negative.   Respiratory: Negative.   Cardiovascular: Negative.   Gastrointestinal: Negative.   Endocrine: Negative.   Genitourinary:  Negative.   Musculoskeletal: Positive for myalgias.  Skin: Negative.   Allergic/Immunologic: Negative.   Neurological: Positive for numbness.  Hematological: Negative.   Psychiatric/Behavioral: Negative.   All other systems reviewed and are negative.      Objective:   Physical Exam  Constitutional: He is oriented to person, place, and time. He appears well-developed and well-nourished.  HENT:  Head: Normocephalic and atraumatic.  Eyes: Conjunctivae and EOM are normal. Pupils are equal, round, and reactive to light.  Neck: Normal range of motion. Neck supple.  Cardiovascular: Normal rate, regular rhythm and normal heart sounds.  Exam reveals no friction rub.   No murmur heard. Neurological: He is alert  and oriented to person, place, and time.  Wernicke's aphasia Ideomotor apraxia  Psychiatric: He has a normal mood and affect. His behavior is normal.  Nursing note and vitals reviewed.         Assessment & Plan:  1.  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 However, this may cause even more drowsiness. Also, would require monthly clinic visits since it is a schedule 2 medication  Discussed lower extremity stretching exercises  Six-month follow-up If patient wishes to trial Nucynta will need to come in sooner. 4. Controlled substance agreement, urine drug screen testing  Over half of the 25 min visit was spent counseling and coordinating care.

## 2016-06-05 ENCOUNTER — Other Ambulatory Visit: Payer: Self-pay | Admitting: *Deleted

## 2016-06-05 MED ORDER — TRAMADOL HCL 50 MG PO TABS: 100.0000 mg | ORAL_TABLET | Freq: Four times a day (QID) | ORAL | 1 refills | Status: DC

## 2016-06-06 ENCOUNTER — Telehealth: Payer: Self-pay | Admitting: *Deleted

## 2016-06-06 ENCOUNTER — Other Ambulatory Visit: Payer: Self-pay

## 2016-06-06 NOTE — Telephone Encounter (Signed)
Received fax from [facility] stating ELIQUIS 5mg  tablet approved effective dates 05/02/2016-06/01/2017

## 2016-06-22 ENCOUNTER — Other Ambulatory Visit: Payer: BLUE CROSS/BLUE SHIELD | Admitting: *Deleted

## 2016-06-22 DIAGNOSIS — I4819 Other persistent atrial fibrillation: Secondary | ICD-10-CM

## 2016-06-22 DIAGNOSIS — I6523 Occlusion and stenosis of bilateral carotid arteries: Secondary | ICD-10-CM

## 2016-06-22 DIAGNOSIS — I77810 Thoracic aortic ectasia: Secondary | ICD-10-CM

## 2016-06-22 DIAGNOSIS — E785 Hyperlipidemia, unspecified: Secondary | ICD-10-CM

## 2016-06-22 LAB — HEPATIC FUNCTION PANEL
ALT: 26 IU/L (ref 0–44)
AST: 24 IU/L (ref 0–40)
Albumin: 4.1 g/dL (ref 3.6–4.8)
Alkaline Phosphatase: 75 IU/L (ref 39–117)
BILIRUBIN TOTAL: 0.5 mg/dL (ref 0.0–1.2)
Bilirubin, Direct: 0.16 mg/dL (ref 0.00–0.40)
Total Protein: 6.4 g/dL (ref 6.0–8.5)

## 2016-06-22 LAB — BASIC METABOLIC PANEL
BUN/Creatinine Ratio: 12 (ref 10–24)
BUN: 12 mg/dL (ref 8–27)
CO2: 27 mmol/L (ref 18–29)
CREATININE: 1.01 mg/dL (ref 0.76–1.27)
Calcium: 9.4 mg/dL (ref 8.6–10.2)
Chloride: 102 mmol/L (ref 96–106)
GFR calc Af Amer: 91 mL/min/{1.73_m2} (ref >59)
GFR, EST NON AFRICAN AMERICAN: 79 mL/min/{1.73_m2} (ref >59)
Glucose: 85 mg/dL (ref 65–99)
Potassium: 5.3 mmol/L — ABNORMAL HIGH (ref 3.5–5.2)
SODIUM: 142 mmol/L (ref 134–144)

## 2016-06-22 LAB — CBC WITH DIFFERENTIAL/PLATELET
Basophils Absolute: 0 10*3/uL (ref 0.0–0.2)
Basos: 0 %
EOS (ABSOLUTE): 0.1 10*3/uL (ref 0.0–0.4)
Eos: 2 %
Hematocrit: 42.3 % (ref 37.5–51.0)
Hemoglobin: 15 g/dL (ref 13.0–17.7)
IMMATURE GRANS (ABS): 0 10*3/uL (ref 0.0–0.1)
IMMATURE GRANULOCYTES: 0 %
LYMPHS ABS: 1.4 10*3/uL (ref 0.7–3.1)
Lymphs: 39 %
MCH: 31.2 pg (ref 26.6–33.0)
MCHC: 35.5 g/dL (ref 31.5–35.7)
MCV: 88 fL (ref 79–97)
Monocytes Absolute: 0.4 10*3/uL (ref 0.1–0.9)
Monocytes: 11 %
NEUTROS ABS: 1.7 10*3/uL (ref 1.4–7.0)
Neutrophils: 48 %
PLATELETS: 245 10*3/uL (ref 150–379)
RBC: 4.81 x10E6/uL (ref 4.14–5.80)
RDW: 12.9 % (ref 12.3–15.4)
WBC: 3.5 10*3/uL (ref 3.4–10.8)

## 2016-06-22 LAB — MAGNESIUM: Magnesium: 2.4 mg/dL — ABNORMAL HIGH (ref 1.6–2.3)

## 2016-06-22 LAB — LIPID PANEL
CHOLESTEROL TOTAL: 108 mg/dL (ref 100–199)
Chol/HDL Ratio: 3.7 ratio (ref 0.0–5.0)
HDL: 29 mg/dL — ABNORMAL LOW (ref >39)
LDL CALC: 64 mg/dL (ref 0–99)
Triglycerides: 75 mg/dL (ref 0–149)
VLDL CHOLESTEROL CAL: 15 mg/dL (ref 5–40)

## 2016-06-22 MED FILL — traMADol HCL ER 300 MG TB24: 300 | 30 days supply | Qty: 30 | Fill #0

## 2016-06-23 ENCOUNTER — Other Ambulatory Visit: Payer: BLUE CROSS/BLUE SHIELD

## 2016-07-10 ENCOUNTER — Ambulatory Visit (INDEPENDENT_AMBULATORY_CARE_PROVIDER_SITE_OTHER): Payer: BLUE CROSS/BLUE SHIELD | Admitting: Neurology

## 2016-07-10 ENCOUNTER — Encounter: Payer: Self-pay | Admitting: Neurology

## 2016-07-10 VITALS — BP 110/67 | HR 72 | Wt 198.0 lb

## 2016-07-10 DIAGNOSIS — I6522 Occlusion and stenosis of left carotid artery: Secondary | ICD-10-CM

## 2016-07-10 DIAGNOSIS — I6521 Occlusion and stenosis of right carotid artery: Secondary | ICD-10-CM

## 2016-07-10 DIAGNOSIS — I480 Paroxysmal atrial fibrillation: Secondary | ICD-10-CM | POA: Diagnosis not present

## 2016-07-10 DIAGNOSIS — G890 Central pain syndrome: Secondary | ICD-10-CM | POA: Diagnosis not present

## 2016-07-10 HISTORY — DX: Paroxysmal atrial fibrillation: I48.0

## 2016-07-10 HISTORY — DX: Occlusion and stenosis of left carotid artery: I65.22

## 2016-07-10 NOTE — Progress Notes (Signed)
STROKE NEUROLOGY FOLLOW UP NOTE  NAME: [name] DOB: 08-Nov-1955  REASON FOR VISIT: stroke follow up HISTORY FROM: pt and wife and chart  Today we had the pleasure of seeing [name] in follow-up at our Neurology Clinic. Pt was accompanied by wife.   History Summary [name] is a 61 y.o. male with history of atrial fibrillation not on anticoagulation, basal cell carcinoma, and nephrolithiasis was admitted on 09/28/14 for prolonged confusion with difficulty communicating and following commands. 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 MRA showed left ICA occlusion, and CTA showed left ICA occlusion with right ICA 65% stenosis. Carotid Doppler showed right ICA 40-59% stenosis and left ICA occlusion. Echo unremarkable. LDL 102 and A1c 5.9. He was started on eliquis 5 mg bid and Lipitor 40 for stroke prevention. In terms of left ICA occlusion and right ICA 65% stenosis, vascular surgery was consulted, however, no intervention offered due to right ICA and less than 80% stenosis. He was discharged to home with outpatient speech therapy.  12/18/14 follow up - the patient has been doing well. Speech improved although still has partial global aphasia. Blood pressure in clinic 101/66 he is on Lopressor for rate control. I'll increase and Lipitor without side effects. Had MRI lumbar spine due to right lower extremity pain, which was unremarkable. No other complaints.  03/12/15 follow up - the patient had improvement in his speech. Had second opinion from Dr. Redmond Pulling in [facility] and still recommend medical treatment for the right ICA stenosis, no intervention recommended. He will see Dr. Redmond Pulling again next month for CUS monitoring. He still has right sided neuropathic pain and stiffness, has been following with Dr. Letta Pate and now on lyrica 300mg  bid, tramadol 100mg  tid and zanaflex PRN. However, pt stated that they are not helping. BP today 93/65. He had left eye blurry  vision after exposure to bright light, lasting 1-2 hours.   07/01/15 follow up - pt has been doing the same. Continues to have right leg numbness, painful feeling, tightness. Right arm and face much improved. He did not tolerate trileptal due to drowsiness and was stopped by himself. Continued on tramadol and lyrica and zanaflex. Able to walk well. Speech about the same, occasional hesitancy. BP still at low side, 98/59. Asymptomatic. On metoprolol 12.5mg  daily only. Recently has poison ivy allergy, on 12 day dose of steroids.  12/31/15 follow up - pt has been doing better. He continues to have right leg painful feeling but not able to tolerate cymbalta, currently on lyrica and tramadol. Speech seems improved and with more words out. BP better 112/79 after metoprolol discontinued. He recently has intermittent flank pain on the right and considered kidney stone. Is going to have CT done next Friday. Today he also complains of right dorsal hand itching and swollen. Denies any allergy and insect bites. BP at home 110-130 most of the time.   Interval History During the interval time, pt has been doing well, no recurrent stroke. He had kidney stone surgery in 01/2016. Repeat CUS in 03/2016 showed chronic left ICA occlusion and right ICA 75-99% stenosis. Dr. Redmond Pulling offered CREST II trial but pt declined. He got right CEA 07/05/16 with Dr. Redmond Pulling. So far he feels some soreness at right incision site but otherwise doing well. Felt speech is continue improving. BP stable at 110/67.   REVIEW OF SYSTEMS: Full 14 system review of systems performed and notable only for those listed below and in HPI  above, all others are negative:  Constitutional:   Cardiovascular:  Ear/Nose/Throat:   Skin:  Eyes:   Respiratory:   Gastroitestinal:  constipation Genitourinary:  Hematology/Lymphatic:   Endocrine:  Musculoskeletal:   Allergy/Immunology:   Neurological:  Numbness, speech difficulty, RLE pain Psychiatric:  Sleep:    The following represents the patient's updated allergies and side effects list: No Known Allergies  The neurologically relevant items on the patient's problem list were reviewed on today's visit.  Neurologic Examination  A problem focused neurological exam (12 or more points of the single system neurologic examination, vital signs counts as 1 point, cranial nerves count for 8 points) was performed.  Blood pressure 110/67, pulse 72, weight 198 lb (89.8 kg).  General - Well nourished, well developed, in no apparent distress.  Ophthalmologic -  Fundi not visualized due to noncooperation.  Cardiovascular - irregularly irregular heart rate and rhythm.  Mental Status -  Level of arousal and orientation to time, place, and person were intact. Language exam showed occasional speech hesitancy and paraphasic errors, able to follow some simple commands, but not complex commands. Able to name and repeat.   Cranial Nerves II - XII - II - Visual field intact OU. III, IV, VI - Extraocular movements intact. V - right facial sensation decreased. VII - right nasolabial fold mild flattening. VIII - Hearing & vestibular intact bilaterally X - Palate elevates symmetrically. XI - Chin turning & shoulder shrug intact bilaterally. XII - Tongue protrusion intact.  Motor Strength - The patient's strength was normal in all extremities and pronator drift was absent.  Bulk was normal and fasciculations were absent.   Motor Tone - Muscle tone was assessed at the neck and appendages and was normal.  Reflexes - The patient's reflexes were 1+ in all extremities and he had no pathological reflexes.  Sensory - Light touch, temperature/pinprick were assessed and were decreased on the right.    Coordination - The patient had normal movements in the hands and feet with no ataxia or dysmetria.  Tremor was absent.  Gait and Station - The patient's transfers, posture, gait, station, and turns were observed as  normal.  Data reviewed: I personally reviewed the images and agree with the radiology interpretations.  Dg Chest 2 View  09/25/2014  Mild right basilar atelectasis. Interstitial thickening, this appears chronic.   Ct Head Wo Contrast 09/24/2014  Acute nonhemorrhagic infarction in the posterior left temporal lobe and left parietal lobe. Mild mass effect with sulcal effacement but no midline shift.   Ct Angio Neck W/cm &/or Wo/cm 09/25/2014  1. 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  2. The right internal carotid artery is narrowed to 1.9 mm. This represents the 65% stenosis relative to the more distal vessel at 5.5 mm.  3. The vertebral arteries are intact bilaterally.  4. Degenerative changes of the cervical spine are most evident at C5-6 with mild osseous foraminal narrowing bilaterally.   Mr Brain Wo Contrast 09/25/2014   MRI HEAD:  Acute large LEFT middle cerebral artery territory infarct without hemorrhagic conversion. Tubular susceptibility artifact LEFT cerebrum most compatible with thromboembolism distal LEFT MCA segment. Mild white matter changes compatible with chronic small vessel ischemic disease.   MRA HEAD:  LEFT internal carotid artery occlusion, recommend angiographic imaging in neck for further assessment. Slow flow within LEFT middle cerebral artery, retrograde filling via LEFT PCOM. Complete circle of Willis. Mild LEFT MCA luminal irregularity which suggests atherosclerosis.   CUS - Right:  40-59% ICA stenosis. Left: ICA is occluded. Bilateral: Vertebral artery flow is antegrade.   2D echo - - Left ventricle: The cavity size was normal. There was mild concentric hypertrophy. Systolic function was normal. The estimated ejection fraction was in the range of 60% to 65%. Wall motion was normal; there were no regional wall motion abnormalities. Left ventricular diastolic function parameters were normal.  Lateral annulus E velocity: 11.4 cm/s. Medial annulus E velocity: 9.68 cm/s. - Aorta: Aortic root at upper normal limits of size. Aortic root dimension: 39 mm (ED). - Mitral valve: There was mild regurgitation. - Systemic veins: IVC dilated with normal respiratory variation. Estimated CVP 8 mmHg.  MRI lumbar spine - No significant lumbar spine disc protrusion, foraminal stenosis or central canal stenosis.  CUS 04/18/16 - This is an abnormal carotid duplex exam demonstrating the disease described  below. There is probable occlusion of the left ICA. Increased velocity also  suggests 75-99% stenosis in the right ICA with plaque as described. CCA  volume flow is normal on ther right, and diminshed on the left. Given the  diease on the left, and normal volume flow, there may be an element of  hyperemia accounting for a portion of the increased velocity on the right.  However, still believe it qualifies in this stenotic category in spite of  that. A prior study dated 04/08/15 was reviewed. Velocity is higher in the  right ICA, with unchanged apparent occlusion on the left. CCA volume flow is  marginally higher bilaterally, still with side difference, lower on the left.  Component     Latest Ref Rng 09/25/2014  Cholesterol     0 - 200 mg/dL 156  Triglycerides     <150 mg/dL 117  HDL Cholesterol     >40 mg/dL 31 (L)  Total CHOL/HDL Ratio      5.0  VLDL     0 - 40 mg/dL 23  LDL (calc)     0 - 99 mg/dL 102 (H)  Hemoglobin A1C     4.8 - 5.6 % 5.9 (H)  Mean Plasma Glucose      123    Assessment: As you may recall, he is a 61 y.o. Caucasian male with PMH of atrial fibrillation not on anticoagulation, basal cell carcinoma, and nephrolithiasis was admitted on 09/28/14 for large left MCA territory infarct. CTA showed left ICA occlusion with right ICA 65% stenosis. Carotid Doppler showed right ICA 40-59% stenosis and left ICA occlusion. LDL 102 and A1c 5.9. He was started on eliquis 5 mg bid  and Lipitor 40 for stroke prevention. In terms of left ICA occlusion and right ICA 65% stenosis, vascular surgery was consulted, however, no intervention offered due to right ICA and less than 80% stenosis. Second opinion from Dr. Redmond Pulling at [facility] also recommend conservative measure for the right ICA stenosis. During the interval time, his speech much improved. 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 Letta Pate, on lyrica and tramadol. now off zanaflex. Not tolerating trileptal or cymbalta. BP better and not on BP meds. Had kidney stone surgery 01/2016. Repeat CUS 03/2016 showed right ICA 75-99% stenosis. Had right CEA 07/05/16 with Dr. Redmond Pulling, put on ASA on top of eliquis.  Plan:  - continue eliquis and ASA 81mg  and lipitor for stroke prevention - continue lyrica and tramadol for RLE pain - follow up with Dr. Redmond Pulling as scheduled - Follow up with your primary care physician for stroke risk factor modification. Recommend  maintain blood pressure goal 120-140/80, diabetes with hemoglobin A1c goal below 6.5% and lipids with LDL cholesterol goal below 70 mg/dL.  - follow up with cardiology for A. Fib and with Dr. Letta Pate - check BP at home and goal is 120-140 due to left ICA occlusion - continue speech exercise - follow up in one year.  I spent more than 25 minutes of face to face time with the patient. Greater than 50% of time was spent in counseling and coordination of care. We have discussed about BP management, follow up with cardiology and Dr. Redmond Pulling.   No orders of the defined types were placed in this encounter.   Meds ordered this encounter  Medications  . DISCONTD: HYDROcodone-acetaminophen (NORCO/VICODIN) 5-325 MG tablet    Sig: Take by mouth.  Marland Kitchen aspirin 325 MG tablet    Sig: Take 2 tablets daily (650 mg daily) until 5/14, then stop taking and resume aspirin 81 mg daily., pt taking 81mg  now  . apixaban (ELIQUIS) 5 MG TABS tablet    Sig: Please HOLD  this medication until Monday 07/10/16, then resume taking as normal.  . acetaminophen (TYLENOL) 325 MG tablet    Sig: Take 650 mg by mouth every 6 (six) hours as needed.    There are no Patient Instructions on file for this visit.  Rosalin Hawking, MD PhD [facility] [address] [place], [place] [address] [phone]

## 2016-07-10 NOTE — Patient Instructions (Addendum)
-   continue eliquis and ASA 81mg  and lipitor for stroke prevention - continue lyrica and tramadol for RLE pain - follow up with Dr. Redmond Pulling as scheduled - Follow up with your primary care physician for stroke risk factor modification. Recommend maintain blood pressure goal 120-140/80, diabetes with hemoglobin A1c goal below 6.5% and lipids with LDL cholesterol goal below 70 mg/dL.  - follow up with cardiology for A. Fib and with Dr. Letta Pate - check BP at home and goal is 120-140 due to left ICA occlusion - continue speech exercise - follow up in one year

## 2016-08-03 ENCOUNTER — Telehealth: Payer: Self-pay | Admitting: *Deleted

## 2016-08-03 MED ORDER — TRAMADOL HCL 50 MG PO TABS: 100.0000 mg | ORAL_TABLET | Freq: Four times a day (QID) | ORAL | 1 refills | Status: DC

## 2016-08-03 NOTE — Telephone Encounter (Signed)
Wife [name] called for refills on Endi's lyrica and tramadol.  He prefers the 2  50mg  qid because he feels the extended release does not work as well  The lyrica was filled in march with 90 days plus one refill so he should still be ok with that medication.  [facility] .  Rx printed for Kirsteins to sign .  Appt is 12/01/16

## 2016-08-04 MED ORDER — PREGABALIN 150 MG PO CAPS: 300.0000 mg | ORAL_CAPSULE | Freq: Two times a day (BID) | ORAL | 1 refills | Status: DC

## 2016-08-04 NOTE — Addendum Note (Signed)
Addended by: Wenda Overland on: 08/04/2016 01:04 PM   Modules accepted: Orders

## 2016-08-04 NOTE — Telephone Encounter (Signed)
Faxed Rx for 90 day supplies of tramadol and lyrica [facility]  12:36 pm 08/04/16

## 2016-08-08 DIAGNOSIS — I779 Disorder of arteries and arterioles, unspecified: Secondary | ICD-10-CM | POA: Insufficient documentation

## 2016-08-08 DIAGNOSIS — I739 Peripheral vascular disease, unspecified: Secondary | ICD-10-CM

## 2016-10-30 ENCOUNTER — Other Ambulatory Visit: Payer: Self-pay | Admitting: Cardiology

## 2016-11-29 NOTE — Progress Notes (Signed)
Gi clearance fax to [facility] at [phone]. Fax confirmed.

## 2016-12-01 ENCOUNTER — Encounter: Payer: BLUE CROSS/BLUE SHIELD | Attending: Physical Medicine & Rehabilitation

## 2016-12-01 ENCOUNTER — Ambulatory Visit (HOSPITAL_BASED_OUTPATIENT_CLINIC_OR_DEPARTMENT_OTHER): Payer: BLUE CROSS/BLUE SHIELD | Admitting: Physical Medicine & Rehabilitation

## 2016-12-01 ENCOUNTER — Encounter: Payer: Self-pay | Admitting: Physical Medicine & Rehabilitation

## 2016-12-01 VITALS — BP 106/68 | HR 69

## 2016-12-01 DIAGNOSIS — E785 Hyperlipidemia, unspecified: Secondary | ICD-10-CM | POA: Diagnosis not present

## 2016-12-01 DIAGNOSIS — G890 Central pain syndrome: Secondary | ICD-10-CM | POA: Insufficient documentation

## 2016-12-01 DIAGNOSIS — Z809 Family history of malignant neoplasm, unspecified: Secondary | ICD-10-CM | POA: Insufficient documentation

## 2016-12-01 DIAGNOSIS — Z8249 Family history of ischemic heart disease and other diseases of the circulatory system: Secondary | ICD-10-CM | POA: Insufficient documentation

## 2016-12-01 DIAGNOSIS — Z85828 Personal history of other malignant neoplasm of skin: Secondary | ICD-10-CM | POA: Insufficient documentation

## 2016-12-01 DIAGNOSIS — Z8673 Personal history of transient ischemic attack (TIA), and cerebral infarction without residual deficits: Secondary | ICD-10-CM | POA: Insufficient documentation

## 2016-12-01 MED ORDER — PREGABALIN 150 MG PO CAPS: 300.0000 mg | ORAL_CAPSULE | Freq: Two times a day (BID) | ORAL | 1 refills | Status: DC

## 2016-12-01 MED ORDER — TAPENTADOL HCL 50 MG PO TABS: 50.0000 mg | ORAL_TABLET | Freq: Four times a day (QID) | ORAL | 0 refills | Status: DC | PRN

## 2016-12-01 NOTE — Progress Notes (Signed)
Subjective:    Patient ID: [name], male    DOB: Jan 04, 1956, 61 y.o.   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 He developed increasing neuropathic pain in the right lower extremity greater than the right upper extremity. An MRI of the lumbar spine on 11/28/2014 did not show any compressive lesions. He has been treated for central poststroke pain with tramadol combined with Lyrica. He is currently on maximum dose. Tramadol 100 mg 4 times a day with maximum dose Lyrica 300 mg twice a day. He states that he still has pain in the right lower extremity which increases with activity and limits his activities. He has tried extended release tramadol, but this is only for short period of time.  Interval medical history positive for right carotid endarterectomy  Pain Inventory Average Pain 6 Pain Right Now 6 My pain is constant and aching  In the last 24 hours, has pain interfered with the following? General activity 4 Relation with others 0 Enjoyment of life 5 What TIME of day is your pain at its worst? varies with activity Sleep (in general) Good  Pain is worse with: walking, inactivity, standing and some activites Pain improves with: medication Relief from Meds: 5  Mobility walk without assistance ability to climb steps?  yes do you drive?  no  Function disabled: date disabled .  Neuro/Psych numbness trouble walking spasms dizziness  Prior Studies Any changes since last visit?  no  Physicians involved in your care Any changes since last visit?  no   Family History  Problem Relation Age of Onset  . Cancer Mother   . Heart failure Father    Social History   Social History  . Marital status: Married    Spouse name: N/A  . Number of children: N/A  . Years of education: N/A   Social History Main Topics  . Smoking  status: Never Smoker  . Smokeless tobacco: Never Used  . Alcohol use 0.6 oz/week    1 Cans of beer per week     Comment: occasionally maybe the weekends  . Drug use: No  . Sexual activity: Not Asked   Other Topics Concern  . None   Social History Narrative  . None   Past Surgical History:  Procedure Laterality Date  . LITHOTRIPSY    . VASECTOMY     Past Medical History:  Diagnosis Date  . Basal cell carcinoma   . Bradycardia 10/21/2015  . Carotid artery stenosis    60-70% right and occluded left ICA stenosis  . Dilated aortic root ([place]) 10/20/2015  . Hyperlipidemia LDL goal <70 05/31/2016  . Nephrolithiasis   . Neuropathy   . Persistent atrial fibrillation (HCC)    CHADS2VASC score is 2 (hx of CVA)  . Stroke ([place])    BP 106/68 (BP Location: Left Arm, Patient Position: Sitting, Cuff Size: Normal)   Pulse 69   SpO2 96%   Opioid Risk Score:   Fall Risk Score:  `1  Depression screen PHQ 2/9  Depression screen [facility] 2/9 06/04/2015 10/23/2014  Decreased Interest 0 0  Down, Depressed, Hopeless 0 0  PHQ - 2 Score 0 0  Altered sleeping - 3  Tired, decreased energy - 3  Change in appetite - 0  Feeling bad or failure about yourself  - 0  Trouble concentrating - 0  Moving slowly or  fidgety/restless - 0  Suicidal thoughts - 0  PHQ-9 Score - 6  Difficult doing work/chores - Not difficult at all    Review of Systems  Constitutional: Negative.   HENT: Negative.   Eyes: Negative.   Respiratory: Negative.   Cardiovascular: Negative.   Gastrointestinal: Negative.   Endocrine: Negative.   Genitourinary: Negative.   Musculoskeletal: Positive for arthralgias and gait problem.  Skin: Negative.   Allergic/Immunologic: Negative.   Neurological: Positive for dizziness and numbness.  Hematological: Negative.   Psychiatric/Behavioral: Negative.        Objective:   Physical Exam  Constitutional: He appears well-developed and well-nourished. No distress.  HENT:  Head:  Normocephalic and atraumatic.  Eyes: Pupils are equal, round, and reactive to light. Conjunctivae and EOM are normal.  Neurological: He is alert. He displays no atrophy and no tremor.  Motor strength is difficult to assess secondary to his apraxia. His right upper and right lower extremity strength appear near normal. His speech remains aphasic, he has difficulty with comprehension, gestural cues are needed. Even gestural cues have limited effectiveness. Due to motor apraxia. The patient notes some pain with light palpation along the right thigh area. Assessing sensation is also difficult due to his aphasia  Skin: He is not diaphoretic. There is erythema.  Erythema at right elbow crease, no papular lesions  Psychiatric: He has a normal mood and affect.  Nursing note and vitals reviewed.         Assessment & Plan:  1. Dejerine Roussy syndrome-. Right lower greater than right upper extremity pain,  Partial relief with Lyrica 300 twice a day as well as tramadol 100 4 times a day We'll discontinue tramadol Trial Nucynta 50 4 times a day Return to clinic one month If this med causes excessive side effects. We'll need to switch back to tramadol. We may revisit tramadol ER We discussed that if he stays on Nucynta he will need monthly clinic visits. Also, this is not a generic medication with more out of pocket expenses.

## 2016-12-01 NOTE — Patient Instructions (Signed)
Please stop the tramadol, start Nucynta 1 tablet 4 times per day Continue Lyrica twice a day Return to clinic in one month to assess the effectiveness of the new medication

## 2016-12-04 ENCOUNTER — Telehealth: Payer: Self-pay | Admitting: Neurology

## 2016-12-04 NOTE — Telephone Encounter (Signed)
Barb/[facility] [phone] called she has not rec'd any faxes yet. She is wanting clearance to be scanned into [employer] and then she can pull it.

## 2016-12-04 NOTE — Telephone Encounter (Signed)
Rn call Raford Pitcher but she was not on the phone. Rn spoke with [place] to state it was fax 11/28/2016 twice and confirmed. Reuben Likes stated one person disburse all the faxes out to different clinics. Rn fax a third time to [phone]. Rn was on the phone and Reuben Likes did receive the fax again today. Reuben Likes will tell Barb.

## 2016-12-04 NOTE — Progress Notes (Signed)
If [facility] calls back I just need to know if they receive the two faxes. I dont need to speak with them. Just documented thanks.   Rn call [facility] that another fax clearance form was fax on 12/01/2016. Rn stated the clearance fax was fax twice on 11/28/2016. RN left name to call back to [phone] if they did get the fax.

## 2016-12-13 ENCOUNTER — Telehealth: Payer: Self-pay

## 2016-12-13 NOTE — Telephone Encounter (Signed)
Patients wife called stating that he started taking nucynta a couple of days ago and that his leg is hurting really bad, she thinks he needs a higher dose. Please advise on what to do.

## 2016-12-14 MED ORDER — TAPENTADOL HCL 50 MG PO TABS: 100.0000 mg | ORAL_TABLET | Freq: Three times a day (TID) | ORAL | 0 refills | Status: DC | PRN

## 2016-12-14 NOTE — Telephone Encounter (Signed)
Mrs Killian notified. Increased Nucynta 50 mg to 2 tablets 3 times per day. New order placed on [facility] and no new RX given.

## 2016-12-14 NOTE — Telephone Encounter (Signed)
I would recommend increasing Nucynta 50 mg to 2 tablets 3 times per day

## 2016-12-19 ENCOUNTER — Encounter: Payer: Self-pay | Admitting: Cardiology

## 2016-12-28 ENCOUNTER — Telehealth: Payer: Self-pay

## 2016-12-28 NOTE — Telephone Encounter (Signed)
Manuela Schwartz Vegh called stating that the patient has been taking nucynta and that it is making his heart race and he is having crazy dreams and that he wants to switch back to tramadol but needs a refill. She also wanted to see if the patient still needs to come in for his appointment tomorrow.

## 2016-12-28 NOTE — Telephone Encounter (Signed)
I called Winfield Cunas, the patients wife, and told her that he needs to come in the office for his appointment tomorrow and to make sure he discusses these issues with his physician.

## 2016-12-29 ENCOUNTER — Encounter: Payer: Self-pay | Admitting: Physical Medicine & Rehabilitation

## 2016-12-29 ENCOUNTER — Encounter: Payer: BLUE CROSS/BLUE SHIELD | Attending: Physical Medicine & Rehabilitation

## 2016-12-29 ENCOUNTER — Telehealth: Payer: Self-pay | Admitting: *Deleted

## 2016-12-29 ENCOUNTER — Ambulatory Visit (HOSPITAL_BASED_OUTPATIENT_CLINIC_OR_DEPARTMENT_OTHER): Payer: BLUE CROSS/BLUE SHIELD | Admitting: Physical Medicine & Rehabilitation

## 2016-12-29 ENCOUNTER — Other Ambulatory Visit: Payer: Self-pay | Admitting: *Deleted

## 2016-12-29 VITALS — BP 101/64 | HR 64

## 2016-12-29 DIAGNOSIS — Z8249 Family history of ischemic heart disease and other diseases of the circulatory system: Secondary | ICD-10-CM | POA: Insufficient documentation

## 2016-12-29 DIAGNOSIS — Z809 Family history of malignant neoplasm, unspecified: Secondary | ICD-10-CM | POA: Insufficient documentation

## 2016-12-29 DIAGNOSIS — Z8673 Personal history of transient ischemic attack (TIA), and cerebral infarction without residual deficits: Secondary | ICD-10-CM | POA: Insufficient documentation

## 2016-12-29 DIAGNOSIS — E785 Hyperlipidemia, unspecified: Secondary | ICD-10-CM | POA: Diagnosis not present

## 2016-12-29 DIAGNOSIS — Z85828 Personal history of other malignant neoplasm of skin: Secondary | ICD-10-CM | POA: Diagnosis not present

## 2016-12-29 DIAGNOSIS — G890 Central pain syndrome: Secondary | ICD-10-CM

## 2016-12-29 MED ORDER — TRAMADOL HCL ER 300 MG PO TB24: 300.0000 mg | ORAL_TABLET | Freq: Every day | ORAL | 1 refills | Status: DC

## 2016-12-29 MED ORDER — PREGABALIN 200 MG PO CAPS: 200.0000 mg | ORAL_CAPSULE | Freq: Three times a day (TID) | ORAL | 2 refills | Status: DC

## 2016-12-29 NOTE — Progress Notes (Signed)
Subjective:    Patient ID: [name], male    DOB: December 02, 1955, 61 y.o.   MRN: [id number]  HPI   Did not tolerate Nucynta  Heart was racing for a couple hours, very tired, nightmares.  The leg pain was not much better than his usual regimen of tramadol 100 mg 4 times per day and Lyrica 150 mg 4 times a day We discussed his Lyrica dosing, he is on the maximum dose of 600 mg, but he should be taking 200 mg 3 times per day  We also discussed previous trials of tramadol ER 300 mg versus tramadol IR 100 mg 4 times a day. His response was equivalent. Although it is difficult with his aphasia to get a very clear history  patient brought back Lyrica prescription and this was destroyed Pain Inventory Average Pain 7 Pain Right Now 7 My pain is constant  In the last 24 hours, has pain interfered with the following? General activity 3 Relation with others 1 Enjoyment of life 3 What TIME of day is your pain at its worst? . Sleep (in general) Good  Pain is worse with: constant Pain improves with: heat/ice and medication Relief from Meds: 2  Mobility walk without assistance ability to climb steps?  yes do you drive?  no  Function disabled: date disabled 09/24/14  Neuro/Psych numbness spasms dizziness  Prior Studies Any changes since last visit?  no  Physicians involved in your care Any changes since last visit?  no   Family History  Problem Relation Age of Onset  . Cancer Mother   . Heart failure Father    Social History   Social History  . Marital status: Married    Spouse name: N/A  . Number of children: N/A  . Years of education: N/A   Social History Main Topics  . Smoking status: Never Smoker  . Smokeless tobacco: Never Used  . Alcohol use 0.6 oz/week    1 Cans of beer per week     Comment: occasionally maybe the weekends  . Drug use: No  . Sexual activity: Not on file   Other Topics Concern  . Not on file   Social History Narrative  . No  narrative on file   Past Surgical History:  Procedure Laterality Date  . LITHOTRIPSY    . VASECTOMY     Past Medical History:  Diagnosis Date  . Basal cell carcinoma   . Bradycardia 10/21/2015  . Carotid artery stenosis    60-70% right and occluded left ICA stenosis  . Dilated aortic root ([place]) 10/20/2015  . Hyperlipidemia LDL goal <70 05/31/2016  . Nephrolithiasis   . Neuropathy   . Persistent atrial fibrillation (HCC)    CHADS2VASC score is 2 (hx of CVA)  . Stroke [facility])    There were no vitals taken for this visit.  Opioid Risk Score:   Fall Risk Score:  `1  Depression screen PHQ 2/9  Depression screen [facility] 2/9 12/29/2016 06/04/2015 10/23/2014  Decreased Interest 0 0 0  Down, Depressed, Hopeless 0 0 0  PHQ - 2 Score 0 0 0  Altered sleeping - - 3  Tired, decreased energy - - 3  Change in appetite - - 0  Feeling bad or failure about yourself  - - 0  Trouble concentrating - - 0  Moving slowly or fidgety/restless - - 0  Suicidal thoughts - - 0  PHQ-9 Score - - 6  Difficult doing work/chores - - Not  difficult at all      Review of Systems     Objective:   Physical Exam  Constitutional: He is oriented to person, place, and time. He appears well-developed and well-nourished.  HENT:  Head: Normocephalic and atraumatic.  Eyes: Pupils are equal, round, and reactive to light. Conjunctivae and EOM are normal.  Neck: Normal range of motion.  Neurological: He is alert and oriented to person, place, and time.  Psychiatric: He has a normal mood and affect. His speech is delayed and slurred.  Patient with receptive and expressive aphasia  Nursing note and vitals reviewed.   Patient is apraxic with manual muscle testing needs gestural cues. Motor strength 4/5 on the right side, 5/5 on left side Gait is without evidence to drag or knee instability No allodynia with light touch in the right lower limb      Assessment & Plan:  1. 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  We will make modification to tramadol Tramadol ER 300 mg per day We'll make the following changes for dose and frequency Lyrica 200 mg 3 times a day  I discussed my recommendations at length with the patient and his wife. He is aphasic, so this was a lengthy process Over half of the 25 min visit was spent counseling and coordinating care.

## 2016-12-29 NOTE — Telephone Encounter (Signed)
[facility] for Lyrica

## 2016-12-29 NOTE — Telephone Encounter (Signed)
[facility]....they do not except lyrica over the phone

## 2016-12-29 NOTE — Patient Instructions (Signed)
Discontinue Nucynta  Switch to tramadol to 300 mg extended release once a day  Switch Lyrica to 200 mg 3 times a day

## 2016-12-29 NOTE — Addendum Note (Signed)
Addended by: Geryl Rankins D on: 12/29/2016 04:30 PM   Modules accepted: Orders

## 2017-01-02 ENCOUNTER — Telehealth: Payer: Self-pay | Admitting: *Deleted

## 2017-01-02 NOTE — Telephone Encounter (Signed)
Faxed [facility]

## 2017-01-03 ENCOUNTER — Ambulatory Visit: Payer: BLUE CROSS/BLUE SHIELD | Admitting: Cardiology

## 2017-01-25 ENCOUNTER — Encounter: Payer: Self-pay | Admitting: Physician Assistant

## 2017-01-25 DIAGNOSIS — I340 Nonrheumatic mitral (valve) insufficiency: Secondary | ICD-10-CM | POA: Insufficient documentation

## 2017-01-25 NOTE — Progress Notes (Addendum)
Cardiology Office Note    Date:  01/26/2017  ID:  [name], DOB 02-Jul-1955, MRN [id number] PCP:  Hulan Fess, MD  Cardiologist:  Dr. Radford Pax   Chief Complaint: palpitations  History of Present Illness:  [name] is a 61 y.o. male with history of paroxysmal atrial fibrillation, bradycardia, carotid artery disease, hyperlipidemia, neuropathy, stroke, dilated aortic root, mild mitral regurgitation who presents for 6 month follow-up.  His atrial fib was initially diagnosed after kidney surgery. This was initially felt to be an isolated event but event monitor in 2016 showed paroxysms of atrial fib. Remote stress testing 2016 showed normal perfusion but borderline ST abnormalities, LVEF 63%. He has known carotid disease followed by [facility]. He had a left MCA stroke on 09/24/14 during a right tympanoplasty. CTA demonstrated left carotid occlusion, right 65% stenosis. He had issues with vision changes, Wernicke aphasia, and problems with his right leg. He underwent s/p right CEA on 07/05/2016. Last OV with Dr. Radford Pax in 05/2016 was fairly uncomplicated. She had noted BB was previously stopped by PCP for low BP and HR, but he was maintaining NSR at that time. Last echo 09/2015 showed EF 25-95%, normal diastolic function, ascending aortic diameter of 4mm with recommendation to repeat in 1 year; mild MR. Last labs 05/2016 showed normal LFTs, CBC, LDL 64, Mg 2.4, K 5.3, Cr 1.01.  He presents back for routine 6 month follow-up today. In general he feels like he is doing well but does notice frequent racing of his heart. He does not have pain with this, but instead a sensation of disconcerting palpitations. No increased dsypnea. No dizziness or syncope. His BP continues to run chronically low. He had a colonoscopy 2 weeks ago for + ColGuard but the study was reportedly normal. He restarted his Eliquis either that night or the day after. In clinic his initial tracing showed rapid atrial  flutter, with rhythm strip showing atrial fib, and f/u tracing showing NSR. Denies any other recent changes to health, overt bleeding or changes in caffeine/alcohol intake.   Past Medical History:  Diagnosis Date  . Altered sensation due to stroke 12/10/2014  . 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 Basal cell carcinoma   . Bradycardia 10/21/2015  . Carotid artery stenosis    a. known occluded left ICA stenosis, s/p right CEA on 07/05/2016.  Marland Kitchen Contusion of calf, left, initial encounter 12/03/2015  . 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 Dilated aortic root ([place]) 10/20/2015  . 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 Hyperlipidemia LDL goal <70 05/31/2016  . ICAO (internal carotid artery occlusion), left 07/10/2016  . 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 Mild mitral regurgitation   . Nephrolithiasis   . Neuropathy   . PAF (paroxysmal atrial fibrillation) ([place]) 07/10/2016    Past Surgical History:  Procedure Laterality Date  . LITHOTRIPSY    . VASECTOMY      Current Medications: Current Meds  Medication Sig  . acetaminophen (TYLENOL) 325 MG tablet Take 650 mg by mouth every 6 (six) hours as needed.  Marland Kitchen apixaban (ELIQUIS) 5 MG TABS tablet Please HOLD this medication until Monday 07/10/16, then resume taking as normal.  . aspirin 325 MG tablet Take 2 tablets daily (650 mg daily) until 5/14, then stop taking and resume aspirin 81 mg daily., pt taking 81mg  now  . atorvastatin (LIPITOR) 40 MG tablet TAKE 1 TABLET  DAILY AT 6 P.M.  . levothyroxine (SYNTHROID, LEVOTHROID) 25 MCG tablet Take 25 mcg by mouth daily before breakfast.   . pregabalin (LYRICA) 200 MG capsule Take 1 capsule (200 mg total) by mouth 3 (three) times daily.  Marland Kitchen senna (SENOKOT) 8.6 MG tablet Take 1 tablet by mouth as needed for constipation. Bedtime  . traMADol (ULTRAM-ER) 300 MG 24 hr tablet Take 1 tablet (300 mg total) by mouth daily.      Allergies:   Patient has no known allergies.   Social History   Socioeconomic History  . Marital status: Married    Spouse name: None  . Number of children: None  . Years of education: None  . Highest education level: None  Social Needs  . Financial resource strain: None  . Food insecurity - worry: None  . Food insecurity - inability: None  . Transportation needs - medical: None  . Transportation needs - non-medical: None  Occupational History  . None  Tobacco Use  . Smoking status: Never Smoker  . Smokeless tobacco: Never Used  Substance and Sexual Activity  . Alcohol use: Yes    Alcohol/week: 0.6 oz    Types: 1 Cans of beer per week    Comment: occasionally maybe the weekends  . Drug use: No  . Sexual activity: None  Other Topics Concern  . None  Social History Narrative  . None     Family History:  Family History  Problem Relation Age of Onset  . Cancer Mother   . Heart failure Father     ROS:   Please see the history of present illness.  All other systems are reviewed and otherwise negative.    PHYSICAL EXAM:   VS:  BP 100/60   Pulse 69   Ht 6\' 2"  (1.88 m)   Wt 194 lb 6.4 oz (88.2 kg)   SpO2 99%   BMI 24.96 kg/m   BMI: Body mass index is 24.96 kg/m. GEN: Well nourished, well developed WM in no acute distress  HEENT: normocephalic, atraumatic Neck: no JVD, carotid bruits, or masses Cardiac: RRR; no murmurs, rubs, or gallops, no edema  Respiratory:  clear to auscultation bilaterally, normal work of breathing GI: soft, nontender, nondistended, + BS MS: no deformity or atrophy  Skin: warm and dry, no rash Neuro:  Alert and Oriented x 3, Strength and sensation are intact, follows commands Psych: euthymic mood, full affect  Wt Readings from Last 3 Encounters:  01/26/17 194 lb 6.4 oz (88.2 kg)  07/10/16 198 lb (89.8 kg)  05/31/16 203 lb (92.1 kg)      Studies/Labs Reviewed:   EKG:  EKG was ordered today and personally reviewed by me and  demonstrates atrial flutter with 2:1 conduction 136bpm with nonspecific ST-T changes. Rhythm strip (per review with Dr. Caryl Comes) reveals atrial fib with variable response. F/u tracing thereafter shows NSR 64bpm no acute ST-T changes with QTc 411ms.  Recent Labs: 06/22/2016: ALT 26; BUN 12; Creatinine, Ser 1.01; Hemoglobin 15.0; Magnesium 2.4; Platelets 245; Potassium 5.3; Sodium 142   Lipid Panel    Component Value Date/Time   CHOL 108 06/22/2016 0952   TRIG 75 06/22/2016 0952   HDL 29 (L) 06/22/2016 0952   CHOLHDL 3.7 06/22/2016 0952   CHOLHDL 5.0 09/25/2014 0400   VLDL 23 09/25/2014 0400   LDLCALC 64 06/22/2016 0952    Additional studies/ records that were reviewed today include: Summarized above.    ASSESSMENT & PLAN:   1. Paroxysmal atrial  flutter/paroxysmal atrial fibrillation - I reviewed with Dr. Caryl Comes today. His initial EKG showed atrial flutter at 136bpm, then rhythm strip showed atrial fib with variable response, and then final tracing showed NSR HR 64bpm. He does report intermittent achypalpitations for the last month. His blood pressure and prior history of bradycardia preclude traditional AV nodal blocking therapies. Per d/w Dr. Caryl Comes, he may be a good candidate for Tikosyn given QTC <421ms. He does not have known history of CAD but does have history of vascular disease. Will need to update echocardiogram for LV function as well as labs. Will refer to atrial fib clinic to further discuss antiarrhythmic therapy. In the interim, I asked the patient to keep a log of BP and symptoms to show provider at that visit. He resumed his Eliquis BID 2 weeks ago. 2. Hyperlipidemia - continue statin. LDL earlier this year well controlled. Consider f/u labs at next OV. 3. Dilated aortic root - f/u echocardiogram. 4. Mild mitral regurgitation - will update echocardiogram.  Disposition: F/u with atrial fib clinic to discuss antiarrhythmic therapy.   Medication Adjustments/Labs and Tests  Ordered: Current medicines are reviewed at length with the patient today.  Concerns regarding medicines are outlined above. Medication changes, Labs and Tests ordered today are summarized above and listed in the Patient Instructions accessible in Encounters.   Signed, Charlie Pitter, PA-C  01/26/2017 10:04 AM    [address], [place], [place]  [address] Phone: [phone]; Fax: [phone]

## 2017-01-26 ENCOUNTER — Ambulatory Visit (HOSPITAL_COMMUNITY): Payer: BLUE CROSS/BLUE SHIELD | Attending: Cardiology

## 2017-01-26 ENCOUNTER — Encounter: Payer: Self-pay | Admitting: Physician Assistant

## 2017-01-26 ENCOUNTER — Ambulatory Visit (INDEPENDENT_AMBULATORY_CARE_PROVIDER_SITE_OTHER): Payer: BLUE CROSS/BLUE SHIELD | Admitting: Physician Assistant

## 2017-01-26 ENCOUNTER — Other Ambulatory Visit: Payer: Self-pay

## 2017-01-26 VITALS — BP 100/60 | HR 69 | Ht 74.0 in | Wt 194.4 lb

## 2017-01-26 DIAGNOSIS — E785 Hyperlipidemia, unspecified: Secondary | ICD-10-CM | POA: Diagnosis not present

## 2017-01-26 DIAGNOSIS — I4891 Unspecified atrial fibrillation: Secondary | ICD-10-CM | POA: Insufficient documentation

## 2017-01-26 DIAGNOSIS — I340 Nonrheumatic mitral (valve) insufficiency: Secondary | ICD-10-CM | POA: Diagnosis not present

## 2017-01-26 DIAGNOSIS — I77810 Thoracic aortic ectasia: Secondary | ICD-10-CM

## 2017-01-26 DIAGNOSIS — I4892 Unspecified atrial flutter: Secondary | ICD-10-CM

## 2017-01-26 DIAGNOSIS — I480 Paroxysmal atrial fibrillation: Secondary | ICD-10-CM

## 2017-01-26 DIAGNOSIS — Z8673 Personal history of transient ischemic attack (TIA), and cerebral infarction without residual deficits: Secondary | ICD-10-CM | POA: Insufficient documentation

## 2017-01-26 LAB — ECHOCARDIOGRAM COMPLETE
HEIGHTINCHES: 74 in
Weight: 3110.4 oz

## 2017-01-26 LAB — BASIC METABOLIC PANEL
BUN/Creatinine Ratio: 13 (ref 10–24)
BUN: 13 mg/dL (ref 8–27)
CALCIUM: 9.2 mg/dL (ref 8.6–10.2)
CO2: 25 mmol/L (ref 20–29)
CREATININE: 0.98 mg/dL (ref 0.76–1.27)
Chloride: 102 mmol/L (ref 96–106)
GFR, EST AFRICAN AMERICAN: 94 mL/min/{1.73_m2} (ref >59)
GFR, EST NON AFRICAN AMERICAN: 82 mL/min/{1.73_m2} (ref >59)
Glucose: 82 mg/dL (ref 65–99)
POTASSIUM: 4.2 mmol/L (ref 3.5–5.2)
Sodium: 142 mmol/L (ref 134–144)

## 2017-01-26 LAB — CBC
HEMATOCRIT: 42.7 % (ref 37.5–51.0)
HEMOGLOBIN: 14.5 g/dL (ref 13.0–17.7)
MCH: 31 pg (ref 26.6–33.0)
MCHC: 34 g/dL (ref 31.5–35.7)
MCV: 91 fL (ref 79–97)
Platelets: 252 10*3/uL (ref 150–379)
RBC: 4.67 x10E6/uL (ref 4.14–5.80)
RDW: 13.2 % (ref 12.3–15.4)
WBC: 5.5 10*3/uL (ref 3.4–10.8)

## 2017-01-26 LAB — TSH: TSH: 3.64 u[IU]/mL (ref 0.450–4.500)

## 2017-01-26 LAB — MAGNESIUM: Magnesium: 2.4 mg/dL — ABNORMAL HIGH (ref 1.6–2.3)

## 2017-01-26 NOTE — Patient Instructions (Signed)
Medication Instructions:  Your physician recommends that you continue on your current medications as directed. Please refer to the Current Medication list given to you today.   Labwork: TODAY:  BMET, CBC, MAGNESIUM, & TSH    Testing/Procedures: Your physician has requested that you have an echocardiogram TODAY.  Echocardiography is a painless test that uses sound waves to create images of your heart. It provides your doctor with information about the size and shape of your heart and how well your heart's chambers and valves are working. This procedure takes approximately one hour. There are no restrictions for this procedure.    Follow-Up: Your physician recommends that you schedule a follow-up appointment in: WITHIN 1 WEEK IN THE AFIB CLINIC.  Any Other Special Instructions Will Be Listed Below (If Applicable).  Echocardiogram An echocardiogram, or echocardiography, uses sound waves (ultrasound) to produce an image of your heart. The echocardiogram is simple, painless, obtained within a short period of time, and offers valuable information to your health care provider. The images from an echocardiogram can provide information such as:  Evidence of coronary artery disease (CAD).  Heart size.  Heart muscle function.  Heart valve function.  Aneurysm detection.  Evidence of a past heart attack.  Fluid buildup around the heart.  Heart muscle thickening.  Assess heart valve function.  Tell a health care provider about:  Any allergies you have.  All medicines you are taking, including vitamins, herbs, eye drops, creams, and over-the-counter medicines.  Any problems you or family members have had with anesthetic medicines.  Any blood disorders you have.  Any surgeries you have had.  Any medical conditions you have.  Whether you are pregnant or may be pregnant. What happens before the procedure? No special preparation is needed. Eat and drink normally. What happens  during the procedure?  In order to produce an image of your heart, gel will be applied to your chest and a wand-like tool (transducer) will be moved over your chest. The gel will help transmit the sound waves from the transducer. The sound waves will harmlessly bounce off your heart to allow the heart images to be captured in real-time motion. These images will then be recorded.  You may need an IV to receive a medicine that improves the quality of the pictures. What happens after the procedure? You may return to your normal schedule including diet, activities, and medicines, unless your health care provider tells you otherwise. This information is not intended to replace advice given to you by your health care provider. Make sure you discuss any questions you have with your health care provider. Document Released: 02/11/2000 Document Revised: 10/02/2015 Document Reviewed: 10/21/2012 Elsevier Interactive Patient Education  2017 [employer].    If you need a refill on your cardiac medications before your next appointment, please call your pharmacy.

## 2017-01-31 ENCOUNTER — Encounter (HOSPITAL_COMMUNITY): Payer: Self-pay | Admitting: Nurse Practitioner

## 2017-01-31 ENCOUNTER — Other Ambulatory Visit: Payer: Self-pay

## 2017-01-31 ENCOUNTER — Ambulatory Visit (HOSPITAL_COMMUNITY)
Admission: RE | Admit: 2017-01-31 | Discharge: 2017-01-31 | Disposition: A | Discharge status: Home / Self Care | Payer: BLUE CROSS/BLUE SHIELD | Source: Ambulatory Visit | Attending: Nurse Practitioner | Admitting: Nurse Practitioner

## 2017-01-31 VITALS — BP 112/68 | HR 70 | Ht 74.0 in | Wt 190.6 lb

## 2017-01-31 DIAGNOSIS — G629 Polyneuropathy, unspecified: Secondary | ICD-10-CM | POA: Diagnosis not present

## 2017-01-31 DIAGNOSIS — I480 Paroxysmal atrial fibrillation: Secondary | ICD-10-CM | POA: Insufficient documentation

## 2017-01-31 DIAGNOSIS — I4892 Unspecified atrial flutter: Secondary | ICD-10-CM | POA: Diagnosis not present

## 2017-01-31 DIAGNOSIS — Z79890 Hormone replacement therapy (postmenopausal): Secondary | ICD-10-CM | POA: Diagnosis not present

## 2017-01-31 DIAGNOSIS — Z8673 Personal history of transient ischemic attack (TIA), and cerebral infarction without residual deficits: Secondary | ICD-10-CM | POA: Diagnosis not present

## 2017-01-31 DIAGNOSIS — Z7901 Long term (current) use of anticoagulants: Secondary | ICD-10-CM | POA: Insufficient documentation

## 2017-01-31 DIAGNOSIS — Z7982 Long term (current) use of aspirin: Secondary | ICD-10-CM | POA: Insufficient documentation

## 2017-01-31 DIAGNOSIS — Z79899 Other long term (current) drug therapy: Secondary | ICD-10-CM | POA: Diagnosis not present

## 2017-01-31 DIAGNOSIS — E785 Hyperlipidemia, unspecified: Secondary | ICD-10-CM | POA: Diagnosis not present

## 2017-01-31 DIAGNOSIS — I4891 Unspecified atrial fibrillation: Secondary | ICD-10-CM | POA: Diagnosis present

## 2017-01-31 NOTE — Progress Notes (Signed)
Primary Care Physician: Hulan Fess, MD Referring Physician: Melina Copa, PA Cardiologist: Dr. Colin Mulders [name] is a 61 y.o. male with a h/o PAF with CHADS2VASC score 23 (h/oCVA,vascular disease) on Apixaban who presents today for evaluation in the afib clinic. He also has a history of  dilated aortic root by echo which also showed normal LVF.He recently saw Eugenia Mcalpine, PA and was found to be in afib/blutter and return to SR, all in the same visit. She discussed with Dr. Caryl Comes, who suggested Tikosyn as he had to stop BB in the past for sinus brady and low BP. He states that visit was around the time that he had an colonoscopy, noted more afib around that time, and since then has not noted any afib, other than 2 times lasting less than 2 minutes .  Today, he denies symptoms of palpitations, chest pain, shortness of breath, orthopnea, PND, lower extremity edema, dizziness, presyncope, syncope, or neurologic sequela. The patient is tolerating medications without difficulties and is otherwise without complaint today.   Past Medical History:  Diagnosis Date  . Altered sensation due to stroke 12/10/2014  . 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 Basal cell carcinoma   . Bradycardia 10/21/2015  . Carotid artery stenosis    a. known occluded left ICA stenosis, s/p right CEA on 07/05/2016.  Marland Kitchen Contusion of calf, left, initial encounter 12/03/2015  . 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 Dilated aortic root ([place]) 10/20/2015  . 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 Hyperlipidemia LDL goal <70 05/31/2016  . ICAO (internal carotid artery occlusion), left 07/10/2016  . 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 Mild mitral regurgitation   . Nephrolithiasis   . Neuropathy   . PAF (paroxysmal atrial fibrillation) ([place]) 07/10/2016   Past Surgical History:  Procedure Laterality Date  . LITHOTRIPSY    . VASECTOMY       Current Outpatient Medications  Medication Sig Dispense Refill  . acetaminophen (TYLENOL) 325 MG tablet Take 650 mg by mouth every 6 (six) hours as needed.    Marland Kitchen apixaban (ELIQUIS) 5 MG TABS tablet Please HOLD this medication until Monday 07/10/16, then resume taking as normal.    . aspirin EC 81 MG tablet Take 81 mg by mouth daily.    Marland Kitchen atorvastatin (LIPITOR) 40 MG tablet TAKE 1 TABLET DAILY AT 6 P.M. 90 tablet 1  . levothyroxine (SYNTHROID, LEVOTHROID) 25 MCG tablet Take 25 mcg by mouth daily before breakfast.     . pregabalin (LYRICA) 200 MG capsule Take 1 capsule (200 mg total) by mouth 3 (three) times daily. 270 capsule 2  . senna (SENOKOT) 8.6 MG tablet Take 1 tablet by mouth as needed for constipation. Bedtime    . traMADol (ULTRAM) 50 MG tablet Take by mouth QID.     No current facility-administered medications for this encounter.     No Known Allergies  Social History   Socioeconomic History  . Marital status: Married    Spouse name: Not on file  . Number of children: Not on file  . Years of education: Not on file  . Highest education level: Not on file  Social Needs  . Financial resource strain: Not on file  . Food insecurity - worry: Not on file  . Food insecurity - inability: Not on file  . Transportation needs - medical: Not on file  . Transportation needs -  non-medical: Not on file  Occupational History  . Not on file  Tobacco Use  . Smoking status: Never Smoker  . Smokeless tobacco: Never Used  Substance and Sexual Activity  . Alcohol use: Yes    Alcohol/week: 0.6 oz    Types: 1 Cans of beer per week    Comment: occasionally maybe the weekends  . Drug use: No  . Sexual activity: Not on file  Other Topics Concern  . Not on file  Social History Narrative  . Not on file    Family History  Problem Relation Age of Onset  . Cancer Mother   . Heart failure Father     ROS- All systems are reviewed and negative except as per the HPI above  Physical  Exam: Vitals:   01/31/17 1057  BP: 112/68  Pulse: 70  Weight: 190 lb 9.6 oz (86.5 kg)  Height: 6\' 2"  (1.88 m)   Wt Readings from Last 3 Encounters:  01/31/17 190 lb 9.6 oz (86.5 kg)  01/26/17 194 lb 6.4 oz (88.2 kg)  07/10/16 198 lb (89.8 kg)    Labs: Lab Results  Component Value Date   NA 142 01/26/2017   K 4.2 01/26/2017   CL 102 01/26/2017   CO2 25 01/26/2017   GLUCOSE 82 01/26/2017   BUN 13 01/26/2017   CREATININE 0.98 01/26/2017   CALCIUM 9.2 01/26/2017   MG 2.4 (H) 01/26/2017   Lab Results  Component Value Date   INR 1.17 09/24/2014   Lab Results  Component Value Date   CHOL 108 06/22/2016   HDL 29 (L) 06/22/2016   LDLCALC 64 06/22/2016   TRIG 75 06/22/2016     GEN- The patient is well appearing, alert and oriented x 3 today.   Head- normocephalic, atraumatic Eyes-  Sclera clear, conjunctiva pink Ears- hearing intact Oropharynx- clear Neck- supple, no JVP Lymph- no cervical lymphadenopathy Lungs- Clear to ausculation bilaterally, normal work of breathing Heart- Regular rate and rhythm, no murmurs, rubs or gallops, PMI not laterally displaced GI- soft, NT, ND, + BS Extremities- no clubbing, cyanosis, or edema MS- no significant deformity or atrophy Skin- no rash or lesion Psych- euthymic mood, full affect Neuro- strength and sensation are intact  EKG-NSR at 70 bpm, pr int 146 ms, qrs int 92 ms, qtc 416 ms Epic records reviewed Echo-Study Conclusions  - Left ventricle: The cavity size was normal. There was mild focal   basal hypertrophy of the septum. Systolic function was normal.   The estimated ejection fraction was in the range of 55% to 60%.   Wall motion was normal; there were no regional wall motion   abnormalities.  Impressions:  - Normal LV systolic function; trace MR and TR.    Assessment and Plan: 1. Paroxysmal afib/flutter Pt currently does not think his burden is significant enough to warrant going to an antiarrythmic He  thinks the stress of the colonoscopy contributed to the afib several weeks ago which has become quiet A 30 day event monitor was discussed and he deferred at this point He would like to return in one month after having the opportunity to keep a chart of afib occurrences and see if his burden justifies change in treatment He was on BB but was stopped in April of this year for bradycardia and hypotension, therefore will not restart at this point He will continue Eliquis 5 mg bid for chadsvasc score of 3  F/u in one month  Butch Penny C. Kayleen Memos, ANP-C Afib  [facility] [address] [place], [place] [address] [phone]

## 2017-02-11 ENCOUNTER — Emergency Department (HOSPITAL_COMMUNITY): Payer: BLUE CROSS/BLUE SHIELD

## 2017-02-11 ENCOUNTER — Encounter (HOSPITAL_COMMUNITY): Payer: Self-pay | Admitting: *Deleted

## 2017-02-11 ENCOUNTER — Emergency Department (HOSPITAL_COMMUNITY)
Admission: EM | Admit: 2017-02-11 | Discharge: 2017-02-12 | Disposition: A | Discharge status: Home / Self Care | Payer: BLUE CROSS/BLUE SHIELD | Attending: Emergency Medicine | Admitting: Emergency Medicine

## 2017-02-11 ENCOUNTER — Other Ambulatory Visit: Payer: Self-pay

## 2017-02-11 DIAGNOSIS — I259 Chronic ischemic heart disease, unspecified: Secondary | ICD-10-CM | POA: Insufficient documentation

## 2017-02-11 DIAGNOSIS — Z7901 Long term (current) use of anticoagulants: Secondary | ICD-10-CM | POA: Insufficient documentation

## 2017-02-11 DIAGNOSIS — I69320 Aphasia following cerebral infarction: Secondary | ICD-10-CM | POA: Insufficient documentation

## 2017-02-11 DIAGNOSIS — Z79899 Other long term (current) drug therapy: Secondary | ICD-10-CM | POA: Insufficient documentation

## 2017-02-11 DIAGNOSIS — Z7982 Long term (current) use of aspirin: Secondary | ICD-10-CM | POA: Diagnosis not present

## 2017-02-11 DIAGNOSIS — R002 Palpitations: Secondary | ICD-10-CM | POA: Insufficient documentation

## 2017-02-11 DIAGNOSIS — I480 Paroxysmal atrial fibrillation: Secondary | ICD-10-CM | POA: Diagnosis not present

## 2017-02-11 DIAGNOSIS — R079 Chest pain, unspecified: Secondary | ICD-10-CM | POA: Diagnosis present

## 2017-02-11 LAB — CBC
HCT: 41.1 % (ref 39.0–52.0)
HEMOGLOBIN: 13.8 g/dL (ref 13.0–17.0)
MCH: 30.9 pg (ref 26.0–34.0)
MCHC: 33.6 g/dL (ref 30.0–36.0)
MCV: 92.2 fL (ref 78.0–100.0)
Platelets: 198 10*3/uL (ref 150–400)
RBC: 4.46 MIL/uL (ref 4.22–5.81)
RDW: 12.6 % (ref 11.5–15.5)
WBC: 5.2 10*3/uL (ref 4.0–10.5)

## 2017-02-11 LAB — BASIC METABOLIC PANEL
ANION GAP: 9 (ref 5–15)
BUN: 11 mg/dL (ref 6–20)
CHLORIDE: 106 mmol/L (ref 101–111)
CO2: 24 mmol/L (ref 22–32)
CREATININE: 1 mg/dL (ref 0.61–1.24)
Calcium: 8.9 mg/dL (ref 8.9–10.3)
GFR calc non Af Amer: >60 mL/min (ref >60)
Glucose, Bld: 98 mg/dL (ref 65–99)
POTASSIUM: 4 mmol/L (ref 3.5–5.1)
SODIUM: 139 mmol/L (ref 135–145)

## 2017-02-11 LAB — TROPONIN I: Troponin I: <0.03 ng/mL (ref <0.03)

## 2017-02-11 NOTE — ED Provider Notes (Signed)
[facility] EMERGENCY DEPARTMENT Provider Note   CSN: [id number] Arrival date & time: 02/11/17  1923     History   Chief Complaint Chief Complaint  Patient presents with  . Chest Pain    HPI [name] is a 61 y.o. male.  61 y.o. male with history of paroxysmal atrial fibrillation, bradycardia, carotid artery disease, hyperlipidemia, neuropathy, stroke, dilated aortic root, mild mitral regurgitation History limited due to patient's aphasia.  He describes a sensation of palpitations and fluttering in his chest that is disconcerting that is been intermittent since 1 AM yesterday.  The sensation comes and goes lasting for a few minutes at a time.  He denies any chest pain, shortness of breath, dizziness, nausea or vomiting he was recently seen in the atrial fibrillation clinic for similar symptoms and was told he may require further rate control agents such as Tikosyn.  He is no longer on beta-blockers due to his bradycardia and hypotension.  Palpitations at the time of his visit in the cardiology office last week or attributed to him being off his medications for his colonoscopy several weeks ago.  He comes in today because he said discomfort and palpitations in his chest ongoing all day.  He denies chest pain or shortness of breath.   The history is provided by the patient and a relative.  Chest Pain   Associated symptoms include palpitations. Pertinent negatives include no abdominal pain, no dizziness, no fever, no headaches, no nausea, no shortness of breath, no vomiting and no weakness.    Past Medical History:  Diagnosis Date  . Altered sensation due to stroke 12/10/2014  . 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 Basal cell carcinoma   . Bradycardia 10/21/2015  . Carotid artery stenosis    a. known occluded left ICA stenosis, s/p right CEA on 07/05/2016.  Marland Kitchen Contusion of calf, left, initial encounter 12/03/2015  . Coronary artery disease   . 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 Dilated aortic root ([place]) 10/20/2015  . 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 Hyperlipidemia LDL goal <70 05/31/2016  . ICAO (internal carotid artery occlusion), left 07/10/2016  . 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 Mild mitral regurgitation   . Nephrolithiasis   . Neuropathy   . PAF (paroxysmal atrial fibrillation) ([place]) 07/10/2016    Patient Active Problem List   Diagnosis Date Noted  . Mild mitral regurgitation 01/25/2017  . PAF (paroxysmal atrial fibrillation) ([place]) 07/10/2016  . ICAO (internal carotid artery occlusion), left 07/10/2016  . Hyperlipidemia LDL goal <70 05/31/2016  . Carotid artery stenosis   . Abdominal pain, chronic, right lower quadrant 12/03/2015  . Contusion of calf, left, initial encounter 12/03/2015  . Bradycardia 10/21/2015  . Dilated aortic root ([place]) 10/20/2015  . Cerebrovascular accident (CVA) due to embolism of left middle cerebral artery ([place]) 03/12/2015  . Altered sensation due to stroke 12/10/2014  . 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 Acute ischemic left MCA stroke ([place])   . ICAO (internal carotid artery occlusion)   . Carotid stenosis   . CVA (cerebral infarction) 09/25/2014  . Stroke ([place]) 09/25/2014  . Persistent atrial fibrillation ([place]) 09/25/2014  . SOB (shortness of breath) 05/19/2014  . Heart palpitations 05/19/2014  Past Surgical History:  Procedure Laterality Date  . LITHOTRIPSY    . VASECTOMY         Home Medications    Prior to Admission medications   Medication Sig Start Date End Date Taking? Authorizing Provider  apixaban (ELIQUIS) 5 MG TABS tablet Take 5 mg twicw a day 07/06/16  Yes [provider]  aspirin EC 81 MG tablet Take 81 mg by mouth daily.   Yes  [provider]  atorvastatin (LIPITOR) 40 MG tablet TAKE 1 TABLET DAILY AT 6 P.M. Patient taking differently: TAKE 40 mg TABLET DAILY AT 6 P.M. 10/31/16  Yes Turner, Eber Hong, MD  levothyroxine (SYNTHROID, LEVOTHROID) 25 MCG tablet Take 25 mcg by mouth daily before breakfast.  05/04/15  Yes [provider]  pregabalin (LYRICA) 200 MG capsule Take 1 capsule (200 mg total) by mouth 3 (three) times daily. 12/29/16  Yes Kirsteins, Luanna Salk, MD  Probiotic Product (PROBIOTIC PO) Take 1 tablet by mouth at bedtime.   Yes [provider]  senna (SENOKOT) 8.6 MG tablet Take 1 tablet by mouth as needed for constipation. Bedtime   Yes [provider]  traMADol (ULTRAM) 50 MG tablet Take 100 mg by mouth QID.    Yes [provider]    Family History Family History  Problem Relation Age of Onset  . Cancer Mother   . Heart failure Father     Social History Social History   Tobacco Use  . Smoking status: Never Smoker  . Smokeless tobacco: Never Used  Substance Use Topics  . Alcohol use: Yes    Alcohol/week: 0.6 oz    Types: 1 Cans of beer per week    Comment: occasionally maybe the weekends  . Drug use: No     Allergies   Patient has no known allergies.   Review of Systems Review of Systems  Constitutional: Negative for activity change, appetite change and fever.  HENT: Negative for congestion.   Eyes: Negative for photophobia.  Respiratory: Positive for chest tightness. Negative for shortness of breath.   Cardiovascular: Positive for palpitations. Negative for chest pain and leg swelling.  Gastrointestinal: Negative for abdominal pain, nausea and vomiting.  Genitourinary: Negative for dysuria and hematuria.  Musculoskeletal: Negative for arthralgias and myalgias.  Skin: Negative for rash.  Neurological: Positive for light-headedness. Negative for dizziness, facial asymmetry, weakness and headaches.   all other systems are negative except as  noted in the HPI and PMH.     Physical Exam Updated Vital Signs BP 131/85   Pulse (!) 54   Temp 97.6 F (36.4 C) (Oral)   Resp 10   SpO2 100%   Physical Exam  Constitutional: He is oriented to person, place, and time. He appears well-developed and well-nourished. No distress.  Aphasia at baseline  HENT:  Head: Normocephalic and atraumatic.  Mouth/Throat: Oropharynx is clear and moist. No oropharyngeal exudate.  Eyes: Conjunctivae and EOM are normal. Pupils are equal, round, and reactive to light.  Neck: Normal range of motion. Neck supple.  No meningismus.  Cardiovascular: Normal rate, regular rhythm, normal heart sounds and intact distal pulses.  No murmur heard. Pulmonary/Chest: Effort normal and breath sounds normal. No respiratory distress.  Abdominal: Soft. There is no tenderness. There is no rebound and no guarding.  Musculoskeletal: Normal range of motion. He exhibits no edema or tenderness.  Neurological: He is alert and oriented to person, place, and time. No cranial nerve deficit. He exhibits normal muscle tone. Coordination  normal.  No ataxia on finger to nose bilaterally. No pronator drift. 5/5 strength throughout. CN 2-12 intact.Equal grip strength. Sensation intact.   Skin: Skin is warm.  Psychiatric: He has a normal mood and affect. His behavior is normal.  Nursing note and vitals reviewed.    ED Treatments / Results  Labs (all labs ordered are listed, but only abnormal results are displayed) Labs Reviewed  BASIC METABOLIC PANEL  CBC  TROPONIN I  TROPONIN I    EKG  EKG Interpretation  Date/Time:  Sunday February 11 2017 19:34:57 EST Ventricular Rate:  61 PR Interval:  154 QRS Duration: 94 QT Interval:  436 QTC Calculation: 438 R Axis:   87 Text Interpretation:  Normal sinus rhythm Possible Left atrial enlargement Borderline ECG no significant change since Jan 31 2017 Confirmed by Sherwood Gambler [phone]) on 02/11/2017 9:28:23 PM        Radiology Dg Chest 2 View  Result Date: 02/11/2017 CLINICAL DATA:  Irregular heart rate. EXAM: CHEST  2 VIEW COMPARISON:  10/05/2014 FINDINGS: Heart and mediastinal contours are within normal limits. No focal opacities or effusions. No acute bony abnormality. IMPRESSION: No active cardiopulmonary disease. Electronically Signed   By: Rolm Baptise M.D.   On: 02/11/2017 20:20    Procedures Procedures (including critical care time)  Medications Ordered in ED Medications - No data to display   Initial Impression / Assessment and Plan / ED Course  I have reviewed the triage vital signs and the nursing notes.  Pertinent labs & imaging results that were available during my care of the patient were reviewed by me and considered in my medical decision making (see chart for details).    Patient arrives with palpitations since 1 AM.  No chest pain.  EKG is sinus rhythm on arrival.  Rate is controlled in the 70s and 80s.  Labs are reassuring. Troponin negative. Orthostatics negative.  Patient describes palpitations, no chest pain or pressure. Low suspicion for ACS.  Troponin negative x2.  Will not initiate beta blocker at this time due to hypotension and bradycardia in the past.  Follow up with Afib clinic this week as scheduled. Return precautions discussed.  Final Clinical Impressions(s) / ED Diagnoses   Final diagnoses:  Palpitations    ED Discharge Orders    None       Ihan Pat, Annie Main, MD 02/12/17 2156

## 2017-02-11 NOTE — ED Triage Notes (Signed)
The pt is c/o an irregular heart rate since 0100am today hx of af  Minimal chest pain  He takes eliquis  Hx of stroke

## 2017-02-12 LAB — TROPONIN I: Troponin I: <0.03 ng/mL (ref <0.03)

## 2017-02-12 NOTE — Discharge Instructions (Signed)
You have not been in atrial fibrillation since you have come to the ED today.  Your testing is reassuring.  Follow-up with your A. fib specialist for further medications that may be needed or to wear monitor at home.  Return to the ED if you develop chest pain, shortness of breath or other concerns.

## 2017-02-15 ENCOUNTER — Ambulatory Visit (HOSPITAL_COMMUNITY): Payer: BLUE CROSS/BLUE SHIELD | Admitting: Nurse Practitioner

## 2017-03-08 ENCOUNTER — Encounter (HOSPITAL_COMMUNITY): Payer: Self-pay | Admitting: Nurse Practitioner

## 2017-03-08 ENCOUNTER — Ambulatory Visit (HOSPITAL_COMMUNITY)
Admission: RE | Admit: 2017-03-08 | Discharge: 2017-03-08 | Disposition: A | Discharge status: Home / Self Care | Payer: Medicare HMO | Source: Ambulatory Visit | Attending: Nurse Practitioner | Admitting: Nurse Practitioner

## 2017-03-08 VITALS — BP 116/78 | HR 78 | Ht 74.0 in | Wt 192.0 lb

## 2017-03-08 DIAGNOSIS — Z8673 Personal history of transient ischemic attack (TIA), and cerebral infarction without residual deficits: Secondary | ICD-10-CM | POA: Diagnosis not present

## 2017-03-08 DIAGNOSIS — Z7982 Long term (current) use of aspirin: Secondary | ICD-10-CM | POA: Insufficient documentation

## 2017-03-08 DIAGNOSIS — Z9889 Other specified postprocedural states: Secondary | ICD-10-CM | POA: Diagnosis not present

## 2017-03-08 DIAGNOSIS — I4892 Unspecified atrial flutter: Secondary | ICD-10-CM | POA: Insufficient documentation

## 2017-03-08 DIAGNOSIS — Z85828 Personal history of other malignant neoplasm of skin: Secondary | ICD-10-CM | POA: Diagnosis not present

## 2017-03-08 DIAGNOSIS — R001 Bradycardia, unspecified: Secondary | ICD-10-CM | POA: Diagnosis not present

## 2017-03-08 DIAGNOSIS — Z8249 Family history of ischemic heart disease and other diseases of the circulatory system: Secondary | ICD-10-CM | POA: Insufficient documentation

## 2017-03-08 DIAGNOSIS — G629 Polyneuropathy, unspecified: Secondary | ICD-10-CM | POA: Diagnosis not present

## 2017-03-08 DIAGNOSIS — E785 Hyperlipidemia, unspecified: Secondary | ICD-10-CM | POA: Insufficient documentation

## 2017-03-08 DIAGNOSIS — I480 Paroxysmal atrial fibrillation: Secondary | ICD-10-CM | POA: Diagnosis not present

## 2017-03-08 DIAGNOSIS — Z79899 Other long term (current) drug therapy: Secondary | ICD-10-CM | POA: Diagnosis not present

## 2017-03-08 DIAGNOSIS — Z809 Family history of malignant neoplasm, unspecified: Secondary | ICD-10-CM | POA: Insufficient documentation

## 2017-03-08 DIAGNOSIS — Z7901 Long term (current) use of anticoagulants: Secondary | ICD-10-CM | POA: Diagnosis not present

## 2017-03-08 DIAGNOSIS — I2510 Atherosclerotic heart disease of native coronary artery without angina pectoris: Secondary | ICD-10-CM | POA: Insufficient documentation

## 2017-03-08 DIAGNOSIS — Z79891 Long term (current) use of opiate analgesic: Secondary | ICD-10-CM | POA: Insufficient documentation

## 2017-03-08 NOTE — Progress Notes (Signed)
Primary Care Physician: Hulan Fess, MD Referring Physician: Melina Copa, PA Cardiologist: Dr. Colin Mulders [name] is a 62 y.o. male with a h/o PAF with CHADS2VASC score 2 -3 (h/oCVA,vascular/carotid disease) on Apixaban who presents today for evaluation in the afib clinic. He also has a history of  dilated aortic root by echo which also showed normal LVF.He recently saw Eugenia Mcalpine, PA and was found to be in aflutterand returned to [place], all in the same visit. She discussed with Dr. Caryl Comes, who suggested Tikosyn as he had to stop BB in the past for sinus brady and low BP. He states that visit was around the time that he had an colonoscopy, noted more afib around that time, and since then has not noted any afib, other than 2 times lasting less than 2 minutes. He was first diagnosed with afib in 2016 and wore an event monitor which documented PAF. Ekg in office 11/208 looked like typical atrial flutter but was transient in the office.   When seen initially in the afib clinic, he did not feel his burden warranted treatment. He deferred another cardiac monitor and wanted to keep up with burden for a month and come back. On 12/16, he went to the ER for off and on palpitations but was found to be in SR.   On return to the afib clinic today,03/08/17, he brings documentation of afib x 7 episodes from 12-9 thru yesterday, some lasting a couple of hours. He still is very hesitant to commit to hospitalization for Tikosyn that he would have to take long term.  Today, he denies symptoms of palpitations, chest pain, shortness of breath, orthopnea, PND, lower extremity edema, dizziness, presyncope, syncope, or neurologic sequela. The patient is tolerating medications without difficulties and is otherwise without complaint today.   Past Medical History:  Diagnosis Date  . Altered sensation due to stroke 12/10/2014  . 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 Basal cell carcinoma   . Bradycardia 10/21/2015  .  Carotid artery stenosis    a. known occluded left ICA stenosis, s/p right CEA on 07/05/2016.  Marland Kitchen Contusion of calf, left, initial encounter 12/03/2015  . Coronary artery disease   . 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 Dilated aortic root ([place]) 10/20/2015  . 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 Hyperlipidemia LDL goal <70 05/31/2016  . ICAO (internal carotid artery occlusion), left 07/10/2016  . 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 Mild mitral regurgitation   . Nephrolithiasis   . Neuropathy   . PAF (paroxysmal atrial fibrillation) ([place]) 07/10/2016   Past Surgical History:  Procedure Laterality Date  . LITHOTRIPSY    . VASECTOMY      Current Outpatient Medications  Medication Sig Dispense Refill  . apixaban (ELIQUIS) 5 MG TABS tablet Take 5 mg twicw a day    . aspirin EC 81 MG tablet Take 81 mg by mouth daily.    Marland Kitchen atorvastatin (LIPITOR) 40 MG tablet TAKE 1 TABLET DAILY AT 6 P.M. (Patient taking differently: TAKE 40 mg TABLET DAILY AT 6 P.M.) 90 tablet 1  . levothyroxine (SYNTHROID, LEVOTHROID) 25 MCG tablet Take 25 mcg by mouth daily before breakfast.     . pregabalin (LYRICA) 200 MG capsule Take 1 capsule (200 mg total) by mouth 3 (three) times daily. 270 capsule 2  . Probiotic Product (PROBIOTIC PO) Take 1 tablet by mouth at bedtime.    Marland Kitchen  senna (SENOKOT) 8.6 MG tablet Take 1 tablet by mouth as needed for constipation. Bedtime    . traMADol (ULTRAM) 50 MG tablet Take 100 mg by mouth QID.      No current facility-administered medications for this encounter.     No Known Allergies  Social History   Socioeconomic History  . Marital status: Married    Spouse name: Not on file  . Number of children: Not on file  . Years of education: Not on file  . Highest education level: Not on file  Social Needs  . Financial resource strain: Not on file  . Food insecurity - worry: Not on file  . Food  insecurity - inability: Not on file  . Transportation needs - medical: Not on file  . Transportation needs - non-medical: Not on file  Occupational History  . Not on file  Tobacco Use  . Smoking status: Never Smoker  . Smokeless tobacco: Never Used  Substance and Sexual Activity  . Alcohol use: Yes    Alcohol/week: 0.6 oz    Types: 1 Cans of beer per week    Comment: occasionally maybe the weekends  . Drug use: No  . Sexual activity: Not on file  Other Topics Concern  . Not on file  Social History Narrative  . Not on file    Family History  Problem Relation Age of Onset  . Cancer Mother   . Heart failure Father     ROS- All systems are reviewed and negative except as per the HPI above  Physical Exam: Vitals:   03/08/17 1114  BP: 116/78  Pulse: 78  Weight: 192 lb (87.1 kg)  Height: 6\' 2"  (1.88 m)   Wt Readings from Last 3 Encounters:  03/08/17 192 lb (87.1 kg)  01/31/17 190 lb 9.6 oz (86.5 kg)  01/26/17 194 lb 6.4 oz (88.2 kg)    Labs: Lab Results  Component Value Date   NA 139 02/11/2017   K 4.0 02/11/2017   CL 106 02/11/2017   CO2 24 02/11/2017   GLUCOSE 98 02/11/2017   BUN 11 02/11/2017   CREATININE 1.00 02/11/2017   CALCIUM 8.9 02/11/2017   MG 2.4 (H) 01/26/2017   Lab Results  Component Value Date   INR 1.17 09/24/2014   Lab Results  Component Value Date   CHOL 108 06/22/2016   HDL 29 (L) 06/22/2016   LDLCALC 64 06/22/2016   TRIG 75 06/22/2016     GEN- The patient is well appearing, alert and oriented x 3 today.   Head- normocephalic, atraumatic Eyes-  Sclera clear, conjunctiva pink Ears- hearing intact Oropharynx- clear Neck- supple, no JVP Lymph- no cervical lymphadenopathy Lungs- Clear to ausculation bilaterally, normal work of breathing Heart- Regular rate and rhythm, no murmurs, rubs or gallops, PMI not laterally displaced GI- soft, NT, ND, + BS Extremities- no clubbing, cyanosis, or edema MS- no significant deformity or  atrophy Skin- no rash or lesion Psych- euthymic mood, full affect Neuro- strength and sensation are intact  EKG-NSR at 85 bpm, pr int 198 ms, qrs int 104 ms, qtc 428 ms Epic records reviewed Echo-Study Conclusions  - Left ventricle: The cavity size was normal. There was mild focal   basal hypertrophy of the septum. Systolic function was normal.   The estimated ejection fraction was in the range of 55% to 60%.   Wall motion was normal; there were no regional wall motion   abnormalities.  Impressions:  - Normal LV systolic function;  trace MR and TR.    Assessment and Plan: 1. Paroxysmal afib/flutter Pt is having several episodes of paroxysmal afib that last several hours Discussed AAD's with pt and he is really not interested in hospitalization and having to take meds long term He would be interested in possible ablation He was on BB but was stopped in April of this year for bradycardia and hypotension, therefore, will not restart  His best best for antiarrythmic would be tikosyn as it will not affect heart rate He will continue Eliquis 5 mg bid for chadsvasc score of 3  I will discuss with Dr. Rayann Heman if he would be inclined to discuss with pt  a primary ablation In the interim, pt will look into the price for [place]. Sabatino Williard, [facility] [address] [place], [place] [address] [phone]

## 2017-03-09 ENCOUNTER — Other Ambulatory Visit: Payer: Self-pay | Admitting: *Deleted

## 2017-03-09 MED ORDER — PREGABALIN 200 MG PO CAPS: 200.0000 mg | ORAL_CAPSULE | Freq: Three times a day (TID) | ORAL | 1 refills | Status: DC

## 2017-03-09 MED ORDER — TRAMADOL HCL ER 300 MG PO TB24: 300.0000 mg | ORAL_TABLET | Freq: Every day | ORAL | 1 refills | Status: DC

## 2017-03-12 ENCOUNTER — Other Ambulatory Visit: Payer: Self-pay | Admitting: *Deleted

## 2017-03-12 MED ORDER — TRAMADOL HCL 50 MG PO TABS: 100.0000 mg | ORAL_TABLET | Freq: Four times a day (QID) | ORAL | 0 refills | Status: DC | PRN

## 2017-03-14 ENCOUNTER — Encounter: Payer: Self-pay | Admitting: Internal Medicine

## 2017-03-14 ENCOUNTER — Ambulatory Visit: Payer: Medicare HMO | Admitting: Internal Medicine

## 2017-03-14 VITALS — BP 124/62 | HR 73 | Ht 74.0 in | Wt 194.0 lb

## 2017-03-14 DIAGNOSIS — I4892 Unspecified atrial flutter: Secondary | ICD-10-CM | POA: Diagnosis not present

## 2017-03-14 DIAGNOSIS — I480 Paroxysmal atrial fibrillation: Secondary | ICD-10-CM | POA: Diagnosis not present

## 2017-03-14 DIAGNOSIS — I77810 Thoracic aortic ectasia: Secondary | ICD-10-CM | POA: Diagnosis not present

## 2017-03-14 LAB — BASIC METABOLIC PANEL
BUN/Creatinine Ratio: 12 (ref 10–24)
BUN: 13 mg/dL (ref 8–27)
CO2: 26 mmol/L (ref 20–29)
CREATININE: 1.06 mg/dL (ref 0.76–1.27)
Calcium: 9.3 mg/dL (ref 8.6–10.2)
Chloride: 100 mmol/L (ref 96–106)
GFR calc Af Amer: 86 mL/min/{1.73_m2} (ref >59)
GFR calc non Af Amer: 74 mL/min/{1.73_m2} (ref >59)
GLUCOSE: 83 mg/dL (ref 65–99)
Potassium: 4.4 mmol/L (ref 3.5–5.2)
Sodium: 140 mmol/L (ref 134–144)

## 2017-03-14 LAB — CBC WITH DIFFERENTIAL/PLATELET
Basophils Absolute: 0 10*3/uL (ref 0.0–0.2)
Basos: 0 %
EOS (ABSOLUTE): 0.1 10*3/uL (ref 0.0–0.4)
EOS: 1 %
HEMATOCRIT: 45.4 % (ref 37.5–51.0)
HEMOGLOBIN: 15.5 g/dL (ref 13.0–17.7)
IMMATURE GRANS (ABS): 0 10*3/uL (ref 0.0–0.1)
IMMATURE GRANULOCYTES: 0 %
LYMPHS: 28 %
Lymphocytes Absolute: 1.9 10*3/uL (ref 0.7–3.1)
MCH: 31.2 pg (ref 26.6–33.0)
MCHC: 34.1 g/dL (ref 31.5–35.7)
MCV: 91 fL (ref 79–97)
MONOCYTES: 11 %
Monocytes Absolute: 0.7 10*3/uL (ref 0.1–0.9)
NEUTROS PCT: 60 %
Neutrophils Absolute: 4 10*3/uL (ref 1.4–7.0)
Platelets: 262 10*3/uL (ref 150–379)
RBC: 4.97 x10E6/uL (ref 4.14–5.80)
RDW: 13.3 % (ref 12.3–15.4)
WBC: 6.7 10*3/uL (ref 3.4–10.8)

## 2017-03-14 NOTE — Patient Instructions (Addendum)
Medication Instructions:  Your physician recommends that you continue on your current medications as directed. Please refer to the Current Medication list given to you today.   Labwork: Your physician recommends that you return for lab work today: BMP/CBC   Testing/Procedures:  Your physician has requested that you have cardiac CT. Cardiac computed tomography (CT) is a painless test that uses an x-ray machine to take clear, detailed pictures of your heart. For further information please visit [facility]. Please follow instruction sheet as given.---week of 03/19/17 prior to the ablation on 03/27/17.  Office will call once approved with insurance  Please arrive at the [facility] main entrance of [facility] at      AM (30-45 minutes prior to test start time)  [facility] [address] [place], [place] [address] [phone]  Proceed to the [facility] Radiology Department (First Floor).  Please follow these instructions carefully (unless otherwise directed):  Hold all erectile dysfunction medications at least 48 hours prior to test.  On the Night Before the Test: . Drink plenty of water. . Do not consume any caffeinated/decaffeinated beverages or chocolate 12 hours prior to your test. . Do not take any antihistamines 12 hours prior to your test.  On the Day of the Test: . Drink plenty of water. Do not drink any water within one hour of the test. . Do not eat any food 4 hours prior to the test. . You may take your regular medications prior to the test.    After the Test: . Drink plenty of water. . After receiving IV contrast, you may experience a mild flushed feeling. This is normal. . On occasion, you may experience a mild rash up to 24 hours after the test. This is not dangerous. If this occurs, you can take Benadryl 25 mg and increase your fluid intake. . If you experience trouble breathing, this can be serious. If it is severe call 911  IMMEDIATELY. If it is mild, please call our office. . If you take any of these medications: Glipizide/Metformin, Avandament, Glucavance, please do not take 48 hours after completing test.    Your physician has recommended that you have an ablation. Catheter ablation is a medical procedure used to treat some cardiac arrhythmias (irregular heartbeats). During catheter ablation, a long, thin, flexible tube is put into a blood vessel in your groin (upper thigh), or neck. This tube is called an ablation catheter. It is then guided to your heart through the blood vessel. Radio frequency waves destroy small areas of heart tissue where abnormal heartbeats may cause an arrhythmia to start. Please see the instruction sheet given to you today.--03/27/17  Please arrive at [facility] at 5:30am Do not eat or drink after midnight the night prior to the procedure Do not take any medications the morning of the test DO NOT miss any doses of Eliquis prior to the morning of procedure Plan for one night stay Will need someone to drive you home at discharge      Follow-Up:  Your physician recommends that you schedule a follow-up appointment in: 4 weeks from 03/27/17 with Roderic Palau, NP and 3 months from 03/27/17 with Dr Rayann Heman  Thank you for choosing [facility]!!

## 2017-03-14 NOTE — H&P (View-Only) (Signed)
Electrophysiology Office Note   Date:  03/14/2017   ID:  [name], DOB June 07, 1955, MRN [id number]  PCP:  Matthew Fess, MD  Cardiologist:  Dr Radford Pax Primary Electrophysiologist: [name], MD    Chief Complaint  Patient presents with  . Atrial Fibrillation    new, discuss AFib ablation     History of Present Illness: [name] is a 62 y.o. male who presents today for electrophysiology evaluation.   He is referred by Dr Radford Pax and Roderic Palau NP for EP consultation regarding afib management.  He was initially diagnosed with atrial fibrillation in 2011 after having nephrostomy for kidney stones.  This was well managed with metoprolol.  He subsequently had afib documented on event monitor 2016 which was placed to evaluate palpitations.  He has also been found to have atrial flutter.   He has sinus bradycardia which limits medical therapy.  He reports increasing frequent and duration of atrial fibrillation.  He is s/p prior stroke and has known L occluded ICA.  He is s/p R CEA 07/05/16. Currently, he thinks that he has afib most days.  He finds that overeating is a trigger.  Today, he denies symptoms of   chest pain, shortness of breath, orthopnea, PND, lower extremity edema, claudication, dizziness, presyncope, syncope, or bleeding. The patient is tolerating medications without difficulties and is otherwise without complaint today.    Past Medical History:  Diagnosis Date  . Altered sensation due to stroke 12/10/2014  . 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 Basal cell carcinoma   . Bradycardia 10/21/2015  . Carotid artery stenosis    a. known occluded left ICA stenosis, s/p right CEA on 07/05/2016.  Marland Kitchen Contusion of calf, left, initial encounter 12/03/2015  . Coronary artery disease   . 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 Dilated aortic root ([place]) 10/20/2015  . 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  Hyperlipidemia LDL goal <70 05/31/2016  . ICAO (internal carotid artery occlusion), left 07/10/2016  . 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 Mild mitral regurgitation   . Nephrolithiasis   . Neuropathy   . PAF (paroxysmal atrial fibrillation) ([place]) 07/10/2016   Past Surgical History:  Procedure Laterality Date  . LITHOTRIPSY    . VASECTOMY       Current Outpatient Medications  Medication Sig Dispense Refill  . apixaban (ELIQUIS) 5 MG TABS tablet Take 5 mg twicw a day    . aspirin EC 81 MG tablet Take 81 mg by mouth daily.    Marland Kitchen atorvastatin (LIPITOR) 40 MG tablet TAKE 1 TABLET DAILY AT 6 P.M. (Patient taking differently: TAKE 40 mg TABLET DAILY AT 6 P.M.) 90 tablet 1  . levothyroxine (SYNTHROID, LEVOTHROID) 25 MCG tablet Take 25 mcg by mouth daily before breakfast.     . pregabalin (LYRICA) 200 MG capsule Take 1 capsule (200 mg total) by mouth 3 (three) times daily. 270 capsule 1  . Probiotic Product (PROBIOTIC PO) Take 1 tablet by mouth at bedtime.    . senna (SENOKOT) 8.6 MG tablet Take 1 tablet by mouth as needed for constipation. Bedtime    . traMADol (ULTRAM) 50 MG tablet Take 2 tablets (100 mg total) by mouth every 6 (six) hours as needed. 240 tablet 0   No current facility-administered medications for this visit.     Allergies:   Patient has no known allergies.   Social  History:  The patient  reports that  has never smoked. he has never used smokeless tobacco. He reports that he drinks about 0.6 oz of alcohol per week. He reports that he does not use drugs.   retired  Family History:  The patient's  family history includes Cancer in his mother; Heart failure in his father.    ROS:  Please see the history of present illness.   All other systems are personally reviewed and negative.    PHYSICAL EXAM: VS:  BP 124/62   Pulse 73   Ht 6\' 2"  (1.88 m)   Wt 194 lb (88 kg)   SpO2 94%   BMI 24.91 kg/m  , BMI Body mass index is 24.91 kg/m. GEN: Well nourished,  well developed, in no acute distress  HEENT: normal  Neck: no JVD, carotid bruits, or masses Cardiac: RRR; no murmurs, rubs, or gallops,no edema  Respiratory:  clear to auscultation bilaterally, normal work of breathing GI: soft, nontender, nondistended, + BS MS: no deformity or atrophy  Skin: warm and dry  Neuro:  + expressive aphasia Psych: euthymic mood, full affect  EKG:  EKGs from afib clinic are personally reviewed and shows sinus rhythm,  There is also sinus bradycardia with heart rates 52 bpm observed   Recent Labs: 06/22/2016: ALT 26 01/26/2017: Magnesium 2.4; TSH 3.640 02/11/2017: BUN 11; Creatinine, Ser 1.00; Hemoglobin 13.8; Platelets 198; Potassium 4.0; Sodium 139  personally reviewed   Lipid Panel     Component Value Date/Time   CHOL 108 06/22/2016 0952   TRIG 75 06/22/2016 0952   HDL 29 (L) 06/22/2016 0952   CHOLHDL 3.7 06/22/2016 0952   CHOLHDL 5.0 09/25/2014 0400   VLDL 23 09/25/2014 0400   LDLCALC 64 06/22/2016 0952   personally reviewed   Wt Readings from Last 3 Encounters:  03/14/17 194 lb (88 kg)  03/08/17 192 lb (87.1 kg)  01/31/17 190 lb 9.6 oz (86.5 kg)      Other studies personally reviewed: Additional studies/ records that were reviewed today include: AF clinic notes, prior echo  Review of the above records today demonstrates: as above   ASSESSMENT AND PLAN:  1.  Paroxysmal atrial fibrillation and atrial flutter The patient has symptomatic atrial arrhythmias.  He has failed medical therapy with metoprolol.  His medicine options are very limited by bradycardia.  Therapeutic strategies for afib/ atrial flutter including medicine (tikosyn) and ablation were discussed in detail with the patient today. Risk, benefits, and alternatives to EP study and radiofrequency ablation for afib were also discussed in detail today. These risks include but are not limited to stroke, bleeding, vascular damage, tamponade, perforation, damage to the esophagus,  lungs, and other structures, pulmonary vein stenosis, worsening renal function, and death. Given prior CAS and stroke, I have informed him that his risks of stroke and neurologic sequela may be increased.  The patient and his wife understand these risk and wishes to proceed.  We will therefore proceed with catheter ablation at the next available time.  Will require cardiac CT prior to ablation.    2. Aortic enlargement Cardiac CT is ordered to follow-up on this   Current medicines are reviewed at length with the patient today.   The patient does not have concerns regarding his medicines.  The following changes were made today:  none   Signed, [name], MD  03/14/2017 9:11 AM     [facility] [address] [place] [address] [phone] (office) [phone] (  fax)

## 2017-03-14 NOTE — Progress Notes (Signed)
Electrophysiology Office Note   Date:  03/14/2017   ID:  [name], DOB [birth year], MRN [id number]  PCP:  Matthew Fess, MD  Cardiologist:  Dr [name] Primary Electrophysiologist: Matthew Grayer, MD    Chief Complaint  Patient presents with  . Atrial Fibrillation    new, discuss AFib ablation     History of Present Illness: [name] is a 62 y.o. male who presents today for electrophysiology evaluation.   He is referred by Dr [name] and [name] for EP consultation regarding afib management.  He was initially diagnosed with atrial fibrillation in 2011 after having nephrostomy for kidney stones.  This was well managed with metoprolol.  He subsequently had afib documented on event monitor 2016 which was placed to evaluate palpitations.  He has also been found to have atrial flutter.   He has sinus bradycardia which limits medical therapy.  He reports increasing frequent and duration of atrial fibrillation.  He is s/p prior stroke and has known L occluded ICA.  He is s/p R CEA 07/05/16. Currently, he thinks that he has afib most days.  He finds that overeating is a trigger.  Today, he denies symptoms of   chest pain, shortness of breath, orthopnea, PND, lower extremity edema, claudication, dizziness, presyncope, syncope, or bleeding. The patient is tolerating medications without difficulties and is otherwise without complaint today.    Past Medical History:  Diagnosis Date  . Altered sensation due to stroke 12/10/2014  . 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 Basal cell carcinoma   . Bradycardia 10/21/2015  . Carotid artery stenosis    a. known occluded left ICA stenosis, s/p right CEA on 07/05/2016.  Marland Kitchen Contusion of calf, left, initial encounter 12/03/2015  . Coronary artery disease   . 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 Dilated aortic root ([place]) 10/20/2015  . 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  Hyperlipidemia LDL goal <70 05/31/2016  . ICAO (internal carotid artery occlusion), left 07/10/2016  . 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 Mild mitral regurgitation   . Nephrolithiasis   . Neuropathy   . PAF (paroxysmal atrial fibrillation) ([place]) 07/10/2016   Past Surgical History:  Procedure Laterality Date  . LITHOTRIPSY    . VASECTOMY       Current Outpatient Medications  Medication Sig Dispense Refill  . apixaban (ELIQUIS) 5 MG TABS tablet Take 5 mg twicw a day    . aspirin EC 81 MG tablet Take 81 mg by mouth daily.    Marland Kitchen atorvastatin (LIPITOR) 40 MG tablet TAKE 1 TABLET DAILY AT 6 P.M. (Patient taking differently: TAKE 40 mg TABLET DAILY AT 6 P.M.) 90 tablet 1  . levothyroxine (SYNTHROID, LEVOTHROID) 25 MCG tablet Take 25 mcg by mouth daily before breakfast.     . pregabalin (LYRICA) 200 MG capsule Take 1 capsule (200 mg total) by mouth 3 (three) times daily. 270 capsule 1  . Probiotic Product (PROBIOTIC PO) Take 1 tablet by mouth at bedtime.    . senna (SENOKOT) 8.6 MG tablet Take 1 tablet by mouth as needed for constipation. Bedtime    . traMADol (ULTRAM) 50 MG tablet Take 2 tablets (100 mg total) by mouth every 6 (six) hours as needed. 240 tablet 0   No current facility-administered medications for this visit.     Allergies:   Patient has no known allergies.   Social  History:  The patient  reports that  has never smoked. he has never used smokeless tobacco. He reports that he drinks about 0.6 oz of alcohol per week. He reports that he does not use drugs.   retired  Family History:  The patient's  family history includes Cancer in his mother; Heart failure in his father.    ROS:  Please see the history of present illness.   All other systems are personally reviewed and negative.    PHYSICAL EXAM: VS:  BP 124/62   Pulse 73   Ht 6\' 2"  (1.88 m)   Wt 194 lb (88 kg)   SpO2 94%   BMI 24.91 kg/m  , BMI Body mass index is 24.91 kg/m. GEN: Well nourished,  well developed, in no acute distress  HEENT: normal  Neck: no JVD, carotid bruits, or masses Cardiac: RRR; no murmurs, rubs, or gallops,no edema  Respiratory:  clear to auscultation bilaterally, normal work of breathing GI: soft, nontender, nondistended, + BS MS: no deformity or atrophy  Skin: warm and dry  Neuro:  + expressive aphasia Psych: euthymic mood, full affect  EKG:  EKGs from afib clinic are personally reviewed and shows sinus rhythm,  There is also sinus bradycardia with heart rates 52 bpm observed   Recent Labs: 06/22/2016: ALT 26 01/26/2017: Magnesium 2.4; TSH 3.640 02/11/2017: BUN 11; Creatinine, Ser 1.00; Hemoglobin 13.8; Platelets 198; Potassium 4.0; Sodium 139  personally reviewed   Lipid Panel     Component Value Date/Time   CHOL 108 06/22/2016 0952   TRIG 75 06/22/2016 0952   HDL 29 (L) 06/22/2016 0952   CHOLHDL 3.7 06/22/2016 0952   CHOLHDL 5.0 09/25/2014 0400   VLDL 23 09/25/2014 0400   LDLCALC 64 06/22/2016 0952   personally reviewed   Wt Readings from Last 3 Encounters:  03/14/17 194 lb (88 kg)  03/08/17 192 lb (87.1 kg)  01/31/17 190 lb 9.6 oz (86.5 kg)      Other studies personally reviewed: Additional studies/ records that were reviewed today include: AF clinic notes, prior echo  Review of the above records today demonstrates: as above   ASSESSMENT AND PLAN:  1.  Paroxysmal atrial fibrillation and atrial flutter The patient has symptomatic atrial arrhythmias.  He has failed medical therapy with metoprolol.  His medicine options are very limited by bradycardia.  Therapeutic strategies for afib/ atrial flutter including medicine (tikosyn) and ablation were discussed in detail with the patient today. Risk, benefits, and alternatives to EP study and radiofrequency ablation for afib were also discussed in detail today. These risks include but are not limited to stroke, bleeding, vascular damage, tamponade, perforation, damage to the esophagus,  lungs, and other structures, pulmonary vein stenosis, worsening renal function, and death. Given prior CAS and stroke, I have informed him that his risks of stroke and neurologic sequela may be increased.  The patient and his wife understand these risk and wishes to proceed.  We will therefore proceed with catheter ablation at the next available time.  Will require cardiac CT prior to ablation.    2. Aortic enlargement Cardiac CT is ordered to follow-up on this   Current medicines are reviewed at length with the patient today.   The patient does not have concerns regarding his medicines.  The following changes were made today:  none   Signed, Matthew Grayer, MD  03/14/2017 9:11 AM     [facility] [address] [place]  [place] [address] [phone] (office) [phone] (  fax)

## 2017-03-20 ENCOUNTER — Ambulatory Visit (HOSPITAL_COMMUNITY)
Admission: RE | Admit: 2017-03-20 | Discharge: 2017-03-20 | Disposition: A | Discharge status: Home / Self Care | Payer: Medicare HMO | Source: Ambulatory Visit | Attending: Internal Medicine | Admitting: Internal Medicine

## 2017-03-20 ENCOUNTER — Ambulatory Visit (HOSPITAL_COMMUNITY): Admission: RE | Admit: 2017-03-20 | Payer: Medicare HMO | Source: Ambulatory Visit

## 2017-03-20 DIAGNOSIS — I480 Paroxysmal atrial fibrillation: Secondary | ICD-10-CM | POA: Insufficient documentation

## 2017-03-20 DIAGNOSIS — I4891 Unspecified atrial fibrillation: Secondary | ICD-10-CM | POA: Diagnosis not present

## 2017-03-20 MED ORDER — IOPAMIDOL (ISOVUE-370) INJECTION 76%
INTRAVENOUS | Status: AC
  Administered 2017-03-20: 80 mL
  Filled 2017-03-20: qty 50

## 2017-03-26 ENCOUNTER — Encounter (HOSPITAL_COMMUNITY): Payer: Self-pay | Admitting: Certified Registered Nurse Anesthetist

## 2017-03-27 ENCOUNTER — Ambulatory Visit (HOSPITAL_COMMUNITY)
Admission: RE | Admit: 2017-03-27 | Discharge: 2017-03-27 | Disposition: A | Discharge status: Home / Self Care | Payer: Medicare HMO | Source: Ambulatory Visit | Attending: Internal Medicine | Admitting: Internal Medicine

## 2017-03-27 ENCOUNTER — Encounter (HOSPITAL_COMMUNITY): Payer: Self-pay | Admitting: Internal Medicine

## 2017-03-27 ENCOUNTER — Ambulatory Visit (HOSPITAL_COMMUNITY): Payer: Medicare HMO | Admitting: Certified Registered Nurse Anesthetist

## 2017-03-27 ENCOUNTER — Encounter (HOSPITAL_COMMUNITY)
Admission: RE | Disposition: A | Discharge status: Home / Self Care | Payer: Self-pay | Source: Ambulatory Visit | Attending: Internal Medicine

## 2017-03-27 DIAGNOSIS — R042 Hemoptysis: Secondary | ICD-10-CM | POA: Diagnosis not present

## 2017-03-27 DIAGNOSIS — E785 Hyperlipidemia, unspecified: Secondary | ICD-10-CM | POA: Diagnosis not present

## 2017-03-27 DIAGNOSIS — I2510 Atherosclerotic heart disease of native coronary artery without angina pectoris: Secondary | ICD-10-CM | POA: Insufficient documentation

## 2017-03-27 DIAGNOSIS — G629 Polyneuropathy, unspecified: Secondary | ICD-10-CM | POA: Insufficient documentation

## 2017-03-27 DIAGNOSIS — Z936 Other artificial openings of urinary tract status: Secondary | ICD-10-CM | POA: Insufficient documentation

## 2017-03-27 DIAGNOSIS — I483 Typical atrial flutter: Secondary | ICD-10-CM | POA: Insufficient documentation

## 2017-03-27 DIAGNOSIS — Z7901 Long term (current) use of anticoagulants: Secondary | ICD-10-CM | POA: Diagnosis not present

## 2017-03-27 DIAGNOSIS — I69320 Aphasia following cerebral infarction: Secondary | ICD-10-CM | POA: Insufficient documentation

## 2017-03-27 DIAGNOSIS — Z85828 Personal history of other malignant neoplasm of skin: Secondary | ICD-10-CM | POA: Diagnosis not present

## 2017-03-27 DIAGNOSIS — Z79890 Hormone replacement therapy (postmenopausal): Secondary | ICD-10-CM | POA: Diagnosis not present

## 2017-03-27 DIAGNOSIS — I4891 Unspecified atrial fibrillation: Secondary | ICD-10-CM | POA: Diagnosis not present

## 2017-03-27 DIAGNOSIS — E039 Hypothyroidism, unspecified: Secondary | ICD-10-CM | POA: Insufficient documentation

## 2017-03-27 DIAGNOSIS — Z79899 Other long term (current) drug therapy: Secondary | ICD-10-CM | POA: Insufficient documentation

## 2017-03-27 DIAGNOSIS — Z7982 Long term (current) use of aspirin: Secondary | ICD-10-CM | POA: Diagnosis not present

## 2017-03-27 DIAGNOSIS — I480 Paroxysmal atrial fibrillation: Secondary | ICD-10-CM | POA: Diagnosis present

## 2017-03-27 DIAGNOSIS — G8929 Other chronic pain: Secondary | ICD-10-CM | POA: Insufficient documentation

## 2017-03-27 HISTORY — PX: ATRIAL FIBRILLATION ABLATION: EP1191

## 2017-03-27 LAB — POCT ACTIVATED CLOTTING TIME
ACTIVATED CLOTTING TIME: 208 s
Activated Clotting Time: 230 seconds
Activated Clotting Time: 197 seconds

## 2017-03-27 SURGERY — ATRIAL FIBRILLATION ABLATION
Anesthesia: General

## 2017-03-27 MED ORDER — SODIUM CHLORIDE 0.9 % IV SOLN
INTRAVENOUS | Status: DC
  Administered 2017-03-27: 07:00:00 via INTRAVENOUS

## 2017-03-27 MED ORDER — APIXABAN 5 MG PO TABS
5.0000 mg | ORAL_TABLET | Freq: Two times a day (BID) | ORAL | Status: DC
  Administered 2017-03-27: 5 mg via ORAL
  Filled 2017-03-27: qty 1

## 2017-03-27 MED ORDER — BUPIVACAINE HCL (PF) 0.25 % IJ SOLN
INTRAMUSCULAR | Status: DC | PRN
  Administered 2017-03-27: 30 mL

## 2017-03-27 MED ORDER — PREGABALIN 50 MG PO CAPS
200.0000 mg | ORAL_CAPSULE | Freq: Three times a day (TID) | ORAL | Status: DC
  Administered 2017-03-27: 200 mg via ORAL
  Filled 2017-03-27: qty 4

## 2017-03-27 MED ORDER — LEVOTHYROXINE SODIUM 25 MCG PO TABS: 25.0000 ug | ORAL_TABLET | Freq: Every day | ORAL | Status: DC

## 2017-03-27 MED ORDER — ISOPROTERENOL HCL 0.2 MG/ML IJ SOLN
INTRAVENOUS | Status: DC | PRN
  Administered 2017-03-27: 10 ug/min via INTRAVENOUS

## 2017-03-27 MED ORDER — HEPARIN SODIUM (PORCINE) 1000 UNIT/ML IJ SOLN
INTRAMUSCULAR | Status: AC
  Filled 2017-03-27: qty 1

## 2017-03-27 MED ORDER — HYDROCODONE-ACETAMINOPHEN 5-325 MG PO TABS: 1.0000 | ORAL_TABLET | ORAL | Status: DC | PRN

## 2017-03-27 MED ORDER — ONDANSETRON HCL 4 MG/2ML IJ SOLN
INTRAMUSCULAR | Status: DC | PRN
  Administered 2017-03-27: 4 mg via INTRAVENOUS

## 2017-03-27 MED ORDER — SODIUM CHLORIDE 0.9 % IV SOLN: 250.0000 mL | INTRAVENOUS | Status: DC | PRN

## 2017-03-27 MED ORDER — LIDOCAINE 2% (20 MG/ML) 5 ML SYRINGE
INTRAMUSCULAR | Status: DC | PRN
  Administered 2017-03-27: 80 mg via INTRAVENOUS

## 2017-03-27 MED ORDER — HEPARIN SODIUM (PORCINE) 1000 UNIT/ML IJ SOLN
INTRAMUSCULAR | Status: DC | PRN
  Administered 2017-03-27: 12000 [IU] via INTRAVENOUS
  Administered 2017-03-27 (×2): 1000 [IU] via INTRAVENOUS

## 2017-03-27 MED ORDER — TRAMADOL HCL 50 MG PO TABS
50.0000 mg | ORAL_TABLET | Freq: Four times a day (QID) | ORAL | Status: DC | PRN
  Administered 2017-03-27: 50 mg via ORAL
  Filled 2017-03-27: qty 1

## 2017-03-27 MED ORDER — PROPOFOL 10 MG/ML IV BOLUS
INTRAVENOUS | Status: DC | PRN
  Administered 2017-03-27: 150 mg via INTRAVENOUS
  Administered 2017-03-27: 50 mg via INTRAVENOUS

## 2017-03-27 MED ORDER — IOPAMIDOL (ISOVUE-370) INJECTION 76%
INTRAVENOUS | Status: AC
  Filled 2017-03-27: qty 50

## 2017-03-27 MED ORDER — ONDANSETRON HCL 4 MG/2ML IJ SOLN: 4.0000 mg | Freq: Four times a day (QID) | INTRAMUSCULAR | Status: DC | PRN

## 2017-03-27 MED ORDER — PROTAMINE SULFATE 10 MG/ML IV SOLN
INTRAVENOUS | Status: DC | PRN
  Administered 2017-03-27: 30 mg via INTRAVENOUS

## 2017-03-27 MED ORDER — IOPAMIDOL (ISOVUE-370) INJECTION 76%
INTRAVENOUS | Status: DC | PRN
  Administered 2017-03-27: 2 mL

## 2017-03-27 MED ORDER — BUPIVACAINE HCL (PF) 0.25 % IJ SOLN
INTRAMUSCULAR | Status: AC
  Filled 2017-03-27: qty 30

## 2017-03-27 MED ORDER — ACETAMINOPHEN 325 MG PO TABS
650.0000 mg | ORAL_TABLET | ORAL | Status: DC | PRN
  Administered 2017-03-27: 650 mg via ORAL
  Filled 2017-03-27: qty 2

## 2017-03-27 MED ORDER — SODIUM CHLORIDE 0.9% FLUSH: 3.0000 mL | INTRAVENOUS | Status: DC | PRN

## 2017-03-27 MED ORDER — DEXAMETHASONE SODIUM PHOSPHATE 4 MG/ML IJ SOLN
INTRAMUSCULAR | Status: DC | PRN
  Administered 2017-03-27: 10 mg via INTRAVENOUS

## 2017-03-27 MED ORDER — ISOPROTERENOL HCL 0.2 MG/ML IJ SOLN
INTRAMUSCULAR | Status: AC
  Filled 2017-03-27: qty 5

## 2017-03-27 MED ORDER — HEPARIN SODIUM (PORCINE) 1000 UNIT/ML IJ SOLN
INTRAMUSCULAR | Status: DC | PRN
  Administered 2017-03-27: 5000 [IU] via INTRAVENOUS
  Administered 2017-03-27 (×2): 2000 [IU] via INTRAVENOUS
  Administered 2017-03-27: 5000 [IU] via INTRAVENOUS

## 2017-03-27 MED ORDER — SODIUM CHLORIDE 0.9% FLUSH
3.0000 mL | Freq: Two times a day (BID) | INTRAVENOUS | Status: DC
  Administered 2017-03-27: 3 mL via INTRAVENOUS

## 2017-03-27 MED ORDER — PHENYLEPHRINE HCL 10 MG/ML IJ SOLN
INTRAVENOUS | Status: DC | PRN
  Administered 2017-03-27: 25 ug/min via INTRAVENOUS

## 2017-03-27 MED ORDER — PANTOPRAZOLE SODIUM 40 MG PO TBEC: 40.0000 mg | DELAYED_RELEASE_TABLET | Freq: Every day | ORAL | 1 refills | Status: DC

## 2017-03-27 SURGICAL SUPPLY — 16 items
BLANKET WARM UNDERBOD FULL ACC (MISCELLANEOUS) ×2 IMPLANT
CATH NAVISTAR SMARTTOUCH DF (ABLATOR) ×1 IMPLANT
CATH SOUNDSTAR 3D IMAGING (CATHETERS) ×2 IMPLANT
CATH VARIABLE LASSO NAV 2515 (CATHETERS) ×2 IMPLANT
CATH WEBSTER BI DIR CS D-F CRV (CATHETERS) ×1 IMPLANT
COVER SWIFTLINK CONNECTOR (BAG) ×3 IMPLANT
NEEDLE TRANSEP BRK 71CM 407200 (NEEDLE) ×2 IMPLANT
PACK EP LATEX FREE (CUSTOM PROCEDURE TRAY) ×2
PACK EP LF (CUSTOM PROCEDURE TRAY) ×1 IMPLANT
PAD DEFIB LIFELINK (PAD) ×2 IMPLANT
PATCH CARTO3 (PAD) ×1 IMPLANT
SHEATH AVANTI 11F 11CM (SHEATH) ×1 IMPLANT
SHEATH PINNACLE 7F 10CM (SHEATH) ×2 IMPLANT
SHEATH PINNACLE 9F 10CM (SHEATH) ×1 IMPLANT
SHEATH SWARTZ TS SL2 63CM 8.5F (SHEATH) ×1 IMPLANT
TUBING SMART ABLATE COOLFLOW (TUBING) ×1 IMPLANT

## 2017-03-27 NOTE — Anesthesia Postprocedure Evaluation (Signed)
Anesthesia Post Note  Patient: [name]  Procedure(s) Performed: ATRIAL FIBRILLATION ABLATION (N/A )     Patient location during evaluation: PACU Anesthesia Type: General Level of consciousness: awake and alert Pain management: pain level controlled Vital Signs Assessment: post-procedure vital signs reviewed and stable Respiratory status: spontaneous breathing, nonlabored ventilation and respiratory function stable Cardiovascular status: blood pressure returned to baseline and stable Postop Assessment: no apparent nausea or vomiting Anesthetic complications: no    Last Vitals:  Vitals:   03/27/17 1205 03/27/17 1215  BP: 121/79 (!) 147/80  Pulse: 70 73  Resp: 10 14  Temp:    SpO2: 100% 100%    Last Pain:  Vitals:   03/27/17 1035  TempSrc: Temporal                 Dekendrick Uzelac,W. EDMOND

## 2017-03-27 NOTE — Progress Notes (Signed)
    S: CTSP secondary to hematuria and blood tinged sputum.  Both were noted earlier in the day by nsg staff.  He was previously having blood tinged secretions when blowing his nose and also coughed up a fair amt of blood earlier in the day.  Both have improved, now only some blood tinged secretions when blowing nose.  He has had sinus congestion and cold Ss for a few days prior to admission.  Hematuria is new.  Per nsg - urine was light pink earlier.  Pt was not aware of this.  He currently has no complaints and remains eager to go home.  O:   Vitals:   03/27/17 1330 03/27/17 1426  BP:  139/80  Pulse: 70 74  Resp: 11 13  Temp: 98.1 F (36.7 C) 97.7 F (36.5 C)  SpO2: 100% 99%   Pleasant, nad.  Aphasia noted.  RRR.  Lungs CTA.  R groin with dsg in place - saturated w/o oozing around it.  A/P:  1.  Hemoptysis and blood tinged secretions:  Improving as day progresses.  Hemoptysis previously felt to be secondary to chronic anticoagulation and LMA insertion/trauma.  He has also had nasal congestion with blood tinged secretions throughout the day.  Provided that this remains stable, will plan on d/c tonight.  If he has recurrent hemoptysis however, will keep and check CXR.  2.  Hematuria:  Urine noted to be pink in color.  He did not have a foley earlier.  He was unaware that this was prev noted.  Will have him urinate again prior to d/c.  If this doesn't clear, he will need outpt urology f/u.  3.  AFib:  S/p RFCA.  R groin dsg saturated but no oozing around site.  I have asked nsg to change dsg and ambulate.  Murray Hodgkins, NP

## 2017-03-27 NOTE — Progress Notes (Signed)
Changed patient's right femoral dressing. Post-ablation. Gauze was saturated, upon assessment. Placed 2x2 gauze and tegaderm. +2 femoral pulse. Pt denies any discomfort. Still on bedrest for 6 hours. Educated patient and wife. Will continue to monitor.

## 2017-03-27 NOTE — Anesthesia Procedure Notes (Addendum)
Procedure Name: LMA Insertion Date/Time: 03/27/2017 7:49 AM Performed by: Candis Shine, CRNA Pre-anesthesia Checklist: Patient identified, Emergency Drugs available, Suction available and Patient being monitored Patient Re-evaluated:Patient Re-evaluated prior to induction Oxygen Delivery Method: Circle System Utilized Preoxygenation: Pre-oxygenation with 100% oxygen Induction Type: IV induction Ventilation: Mask ventilation without difficulty LMA: LMA inserted LMA Size: 5.0 Number of attempts: 2 Placement Confirmation: positive ETCO2 Tube secured with: Tape Dental Injury: Bloody posterior oropharynx

## 2017-03-27 NOTE — Transfer of Care (Signed)
Immediate Anesthesia Transfer of Care Note  Patient: [name]  Procedure(s) Performed: ATRIAL FIBRILLATION ABLATION (N/A )  Patient Location: Cath Lab  Anesthesia Type:General  Level of Consciousness: awake, alert  and oriented  Airway & Oxygen Therapy: Patient Spontanous Breathing and Patient connected to nasal cannula oxygen  Post-op Assessment: Report given to RN and Post -op Vital signs reviewed and stable  Post vital signs: Reviewed and stable  Last Vitals:  Vitals:   03/27/17 0606  BP: 133/69  Pulse: (!) 55  Resp: 18  Temp: 36.9 C  SpO2: 100%    Last Pain:  Vitals:   03/27/17 1035  TempSrc: Temporal      Patients Stated Pain Goal: 3 (60/04/59 9774)  Complications: No apparent anesthesia complications

## 2017-03-27 NOTE — Interval H&P Note (Signed)
History and Physical Interval Note:  03/27/2017 7:24 AM  [name]  has presented today for surgery, with the diagnosis of Afib  The various methods of treatment have been discussed with the patient and family. After consideration of risks, benefits and other options for treatment, the patient has consented to  Procedure(s): ATRIAL FIBRILLATION ABLATION (N/A) as a surgical intervention .  The patient's history has been reviewed, patient examined, no change in status, stable for surgery.  I have reviewed the patient's chart and labs.  Questions were answered to the patient's satisfaction.     [name]

## 2017-03-27 NOTE — Anesthesia Preprocedure Evaluation (Addendum)
Anesthesia Evaluation  Patient identified by MRN, date of birth, ID band Patient awake    Reviewed: Allergy & Precautions, NPO status , Patient's Chart, lab work & pertinent test results  Airway Mallampati: II  TM Distance: >3 FB Neck ROM: Full    Dental no notable dental hx.    Pulmonary neg pulmonary ROS,    Pulmonary exam normal breath sounds clear to auscultation       Cardiovascular + CAD  Normal cardiovascular exam+ dysrhythmias Atrial Fibrillation + Valvular Problems/Murmurs MR  Rhythm:Regular Rate:Normal  ECG: NSR, rate 78, Biatrial enlargement  ECHO: Normal LV systolic function; trace MR and TR.   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 Right sided numbness CVA, Residual Symptoms negative psych ROS   GI/Hepatic negative GI ROS, Neg liver ROS,   Endo/Other  Hypothyroidism   Renal/GU negative Renal ROS     Musculoskeletal Chronic pain   Abdominal   Peds  Hematology HLD   Anesthesia Other Findings   Reproductive/Obstetrics                            Anesthesia Physical Anesthesia Plan  ASA: III  Anesthesia Plan: MAC   Post-op Pain Management:    Induction: Intravenous  PONV Risk Score and Plan: 1 and Propofol infusion, Treatment may vary due to age or medical condition, Ondansetron and Dexamethasone  Airway Management Planned: Natural Airway and Simple Face Mask  Additional Equipment:   Intra-op Plan:   Post-operative Plan:   Informed Consent: I have reviewed the patients History and Physical, chart, labs and discussed the procedure including the risks, benefits and alternatives for the proposed anesthesia with the patient or authorized representative who has indicated his/her understanding and acceptance.   Dental advisory given  Plan Discussed with: CRNA  Anesthesia Plan Comments:        Anesthesia Quick Evaluation

## 2017-03-27 NOTE — Progress Notes (Signed)
Blowing nose for some blood tinged sputum. UOP blood tinged. Dr. Rayann Heman made aware

## 2017-03-27 NOTE — Progress Notes (Addendum)
Site area: 3 rt fv sheaths Site Prior to Removal:  Level 0 Pressure Applied For: 30 minutes Manual:   yes Patient Status During Pull:  stable Post Pull Site:  Level  0 Post Pull Instructions Given:  yes Post Pull Pulses Present: palpable Dressing Applied:  Gauze and tegaderm Bedrest begins @ 1320 Comments: IV saline locked

## 2017-03-27 NOTE — Progress Notes (Signed)
Doing well post ablation Will plan to discharge once bedrest complete  Chanetta Marshall, NP 03/27/2017 3:58 PM

## 2017-03-28 LAB — POCT ACTIVATED CLOTTING TIME
ACTIVATED CLOTTING TIME: 224 s
Activated Clotting Time: 263 seconds
Activated Clotting Time: 323 seconds
Activated Clotting Time: 323 seconds

## 2017-03-29 ENCOUNTER — Other Ambulatory Visit: Payer: Self-pay

## 2017-03-29 ENCOUNTER — Emergency Department (HOSPITAL_COMMUNITY)
Admission: EM | Admit: 2017-03-29 | Discharge: 2017-03-29 | Disposition: A | Discharge status: Home / Self Care | Payer: Medicare HMO | Attending: Emergency Medicine | Admitting: Emergency Medicine

## 2017-03-29 ENCOUNTER — Telehealth: Payer: Self-pay | Admitting: *Deleted

## 2017-03-29 ENCOUNTER — Emergency Department (HOSPITAL_COMMUNITY): Payer: Medicare HMO

## 2017-03-29 DIAGNOSIS — I69351 Hemiplegia and hemiparesis following cerebral infarction affecting right dominant side: Secondary | ICD-10-CM | POA: Insufficient documentation

## 2017-03-29 DIAGNOSIS — I2510 Atherosclerotic heart disease of native coronary artery without angina pectoris: Secondary | ICD-10-CM | POA: Insufficient documentation

## 2017-03-29 DIAGNOSIS — R55 Syncope and collapse: Secondary | ICD-10-CM | POA: Insufficient documentation

## 2017-03-29 DIAGNOSIS — I69320 Aphasia following cerebral infarction: Secondary | ICD-10-CM | POA: Insufficient documentation

## 2017-03-29 DIAGNOSIS — Z85828 Personal history of other malignant neoplasm of skin: Secondary | ICD-10-CM | POA: Diagnosis not present

## 2017-03-29 DIAGNOSIS — R410 Disorientation, unspecified: Secondary | ICD-10-CM | POA: Diagnosis not present

## 2017-03-29 DIAGNOSIS — Z79899 Other long term (current) drug therapy: Secondary | ICD-10-CM | POA: Insufficient documentation

## 2017-03-29 DIAGNOSIS — I484 Atypical atrial flutter: Secondary | ICD-10-CM | POA: Diagnosis not present

## 2017-03-29 DIAGNOSIS — R42 Dizziness and giddiness: Secondary | ICD-10-CM | POA: Diagnosis not present

## 2017-03-29 DIAGNOSIS — Z7901 Long term (current) use of anticoagulants: Secondary | ICD-10-CM | POA: Insufficient documentation

## 2017-03-29 DIAGNOSIS — R0789 Other chest pain: Secondary | ICD-10-CM | POA: Diagnosis present

## 2017-03-29 DIAGNOSIS — I480 Paroxysmal atrial fibrillation: Secondary | ICD-10-CM | POA: Diagnosis not present

## 2017-03-29 DIAGNOSIS — R072 Precordial pain: Secondary | ICD-10-CM | POA: Diagnosis not present

## 2017-03-29 DIAGNOSIS — I4892 Unspecified atrial flutter: Secondary | ICD-10-CM

## 2017-03-29 DIAGNOSIS — Z7982 Long term (current) use of aspirin: Secondary | ICD-10-CM | POA: Diagnosis not present

## 2017-03-29 DIAGNOSIS — I4891 Unspecified atrial fibrillation: Secondary | ICD-10-CM | POA: Diagnosis not present

## 2017-03-29 DIAGNOSIS — R079 Chest pain, unspecified: Secondary | ICD-10-CM

## 2017-03-29 LAB — COMPREHENSIVE METABOLIC PANEL
ALT: 22 U/L (ref 17–63)
AST: 29 U/L (ref 15–41)
Albumin: 3.3 g/dL — ABNORMAL LOW (ref 3.5–5.0)
Alkaline Phosphatase: 57 U/L (ref 38–126)
Anion gap: 8 (ref 5–15)
BILIRUBIN TOTAL: 0.8 mg/dL (ref 0.3–1.2)
BUN: 10 mg/dL (ref 6–20)
CHLORIDE: 104 mmol/L (ref 101–111)
CO2: 24 mmol/L (ref 22–32)
CREATININE: 0.85 mg/dL (ref 0.61–1.24)
Calcium: 8.3 mg/dL — ABNORMAL LOW (ref 8.9–10.3)
Glucose, Bld: 115 mg/dL — ABNORMAL HIGH (ref 65–99)
POTASSIUM: 3.8 mmol/L (ref 3.5–5.1)
Sodium: 136 mmol/L (ref 135–145)
TOTAL PROTEIN: 6.1 g/dL — AB (ref 6.5–8.1)

## 2017-03-29 LAB — CBC
HEMATOCRIT: 38.3 % — AB (ref 39.0–52.0)
Hemoglobin: 12.8 g/dL — ABNORMAL LOW (ref 13.0–17.0)
MCH: 30.9 pg (ref 26.0–34.0)
MCHC: 33.4 g/dL (ref 30.0–36.0)
MCV: 92.5 fL (ref 78.0–100.0)
PLATELETS: 197 10*3/uL (ref 150–400)
RBC: 4.14 MIL/uL — ABNORMAL LOW (ref 4.22–5.81)
RDW: 12.5 % (ref 11.5–15.5)
WBC: 12 10*3/uL — ABNORMAL HIGH (ref 4.0–10.5)

## 2017-03-29 LAB — I-STAT TROPONIN, ED: TROPONIN I, POC: 0.89 ng/mL — AB (ref 0.00–0.08)

## 2017-03-29 MED ORDER — LEVOTHYROXINE SODIUM 25 MCG PO TABS: 25.0000 ug | ORAL_TABLET | Freq: Every day | ORAL | Status: DC

## 2017-03-29 MED ORDER — ETOMIDATE 2 MG/ML IV SOLN: 12.0000 mg | Freq: Once | INTRAVENOUS | Status: DC

## 2017-03-29 MED ORDER — METOPROLOL SUCCINATE ER 25 MG PO TB24: 12.5000 mg | ORAL_TABLET | Freq: Every day | ORAL | 0 refills | Status: DC

## 2017-03-29 MED ORDER — MIDAZOLAM HCL 2 MG/2ML IJ SOLN
INTRAMUSCULAR | Status: AC
  Filled 2017-03-29: qty 4

## 2017-03-29 MED ORDER — TRAMADOL HCL 50 MG PO TABS
100.0000 mg | ORAL_TABLET | Freq: Four times a day (QID) | ORAL | Status: DC | PRN
  Administered 2017-03-29: 100 mg via ORAL
  Filled 2017-03-29: qty 2

## 2017-03-29 MED ORDER — DIGOXIN 125 MCG PO TABS
0.1250 mg | ORAL_TABLET | Freq: Every day | ORAL | Status: DC
  Administered 2017-03-29: 0.125 mg via ORAL
  Filled 2017-03-29: qty 1

## 2017-03-29 MED ORDER — APIXABAN 5 MG PO TABS
5.0000 mg | ORAL_TABLET | Freq: Two times a day (BID) | ORAL | Status: DC
  Administered 2017-03-29: 5 mg via ORAL
  Filled 2017-03-29 (×2): qty 1

## 2017-03-29 MED ORDER — METOPROLOL SUCCINATE 12.5 MG HALF TABLET
12.5000 mg | ORAL_TABLET | Freq: Every day | ORAL | Status: DC
  Administered 2017-03-29: 12.5 mg via ORAL
  Filled 2017-03-29: qty 1

## 2017-03-29 MED ORDER — PREGABALIN 100 MG PO CAPS
200.0000 mg | ORAL_CAPSULE | Freq: Three times a day (TID) | ORAL | Status: DC
  Administered 2017-03-29: 200 mg via ORAL
  Filled 2017-03-29: qty 4

## 2017-03-29 MED ORDER — DIGOXIN 125 MCG PO TABS: 0.1250 mg | ORAL_TABLET | Freq: Every day | ORAL | 0 refills | Status: DC

## 2017-03-29 MED ORDER — SODIUM CHLORIDE 0.9 % IV BOLUS (SEPSIS)
1000.0000 mL | Freq: Once | INTRAVENOUS | Status: AC
  Administered 2017-03-29: 1000 mL via INTRAVENOUS

## 2017-03-29 NOTE — ED Notes (Signed)
Zoll pads placed on patient.  

## 2017-03-29 NOTE — ED Provider Notes (Signed)
[facility] EMERGENCY DEPARTMENT Provider Note  CSN: [id number] Arrival date & time: 03/29/17 6568  Chief Complaint(s) Chest Pain  HPI [name] is a 62 y.o. 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 fib/A flutter status post ablation 2 days ago who presents today with sudden onset substernal chest pressure that began several hours prior to arrival.  Patient states that pain was worse with ambulation.  No shortness of breath.  Denies any recent fevers or infections.  Denies any associated nausea, vomiting, abdominal pain.  Additionally wife reported that patient had a near syncopal episode which caused him to call EMS.  She reports that the patient became minimally responsive follow internal walk back to bed.  He ended up falling but the fall was restricted as the wife was holding on to him at that time. She did mention that he hit his head on the carpeted floor.  Patient is compliant with his Eliquis.  Remainder of history, ROS, and physical exam limited due to patient's condition (aphasia). Additional information was obtained from EMS and family.   Level V Caveat.  In route via EMS noted that the patient was in A. fib RVR with rates in the 130s.  Otherwise hemodynamically stable.  HPI  Past Medical History Past Medical History:  Diagnosis Date  . Altered sensation due to stroke 12/10/2014  . 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 Basal cell carcinoma   . Bradycardia 10/21/2015  . Carotid artery stenosis    a. known occluded left ICA stenosis, s/p right CEA on 07/05/2016.  Marland Kitchen Contusion of calf, left, initial encounter 12/03/2015  . Coronary artery disease   . 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 Dilated aortic root ([place]) 10/20/2015  . 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 Hyperlipidemia LDL goal <70  05/31/2016  . ICAO (internal carotid artery occlusion), left 07/10/2016  . 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 Mild mitral regurgitation   . Nephrolithiasis   . Neuropathy   . PAF (paroxysmal atrial fibrillation) ([place]) 07/10/2016  . Typical atrial flutter [facility])    Patient Active Problem List   Diagnosis Date Noted  . Paroxysmal atrial fibrillation ([place]) 03/27/2017  . Mild mitral regurgitation 01/25/2017  . PAF (paroxysmal atrial fibrillation) ([place]) 07/10/2016  . ICAO (internal carotid artery occlusion), left 07/10/2016  . Hyperlipidemia LDL goal <70 05/31/2016  . Carotid artery stenosis   . Abdominal pain, chronic, right lower quadrant 12/03/2015  . Contusion of calf, left, initial encounter 12/03/2015  . Bradycardia 10/21/2015  . Dilated aortic root ([place]) 10/20/2015  . Cerebrovascular accident (CVA) due to embolism of left middle cerebral artery ([place]) 03/12/2015  . Altered sensation due to stroke 12/10/2014  . 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 Acute ischemic left MCA stroke ([place])   . ICAO (internal carotid artery occlusion)   . Carotid stenosis   . CVA (cerebral infarction) 09/25/2014  . Stroke ([place]) 09/25/2014  . Persistent atrial fibrillation ([place]) 09/25/2014  . SOB (shortness of breath) 05/19/2014  . Heart palpitations 05/19/2014   Home Medication(s) Prior to Admission medications   Medication Sig Start Date End Date Taking? Authorizing Provider  acetaminophen (TYLENOL) 500 MG tablet Take 1,000  mg by mouth every 6 (six) hours as needed (for pain/headaches.).   Yes [provider]  apixaban (ELIQUIS) 5 MG TABS tablet Take 5 mg by mouth 2 (two) times daily.   Yes [provider]  aspirin EC 81 MG tablet Take 81 mg by mouth daily.   Yes [provider]  atorvastatin (LIPITOR) 40 MG  tablet TAKE 1 TABLET DAILY AT 6 P.M. Patient taking differently: TAKE 40 mg TABLET DAILY WITH SUPPER 10/31/16  Yes Turner, Eber Hong, MD  fluticasone (FLONASE) 50 MCG/ACT nasal spray Place 1 spray into both nostrils at bedtime.    Yes [provider]  levothyroxine (SYNTHROID, LEVOTHROID) 25 MCG tablet Take 25 mcg by mouth daily before breakfast.  05/04/15  Yes [provider]  pantoprazole (PROTONIX) 40 MG tablet Take 1 tablet (40 mg total) by mouth daily. 03/27/17 03/27/18 Yes Seiler, Amber K, NP  polyethylene glycol (MIRALAX / GLYCOLAX) packet Take 17 g by mouth daily.   Yes [provider]  pregabalin (LYRICA) 200 MG capsule Take 1 capsule (200 mg total) by mouth 3 (three) times daily. 03/09/17  Yes Kirsteins, Luanna Salk, MD  Probiotic Product (PROBIOTIC PO) Take 1 tablet by mouth at bedtime.   Yes [provider]  senna (SENOKOT) 8.6 MG tablet Take 2 tablets by mouth 2 (two) times daily. Bedtime    Yes [provider]  traMADol (ULTRAM) 50 MG tablet Take 2 tablets (100 mg total) by mouth every 6 (six) hours as needed. Patient taking differently: Take 100 mg by mouth 4 (four) times daily.  03/12/17  Yes Kirsteins, Luanna Salk, MD  digoxin (LANOXIN) 0.125 MG tablet Take 1 tablet (0.125 mg total) by mouth daily. 03/29/17 04/28/17  Fatima Blank, MD  metoprolol succinate (TOPROL-XL) 25 MG 24 hr tablet Take 0.5 tablets (12.5 mg total) by mouth daily. 03/29/17 04/28/17  Fatima Blank, MD                                                                                                                                    Past Surgical History Past Surgical History:  Procedure Laterality Date  . ATRIAL FIBRILLATION ABLATION N/A 03/27/2017   Procedure: ATRIAL FIBRILLATION ABLATION;  Surgeon: [name], MD;  Location: [facility];  Service: Cardiovascular;  Laterality: N/A;  . LITHOTRIPSY    . VASECTOMY     Family History Family History  Problem  Relation Age of Onset  . Cancer Mother   . Heart failure Father     Social History Social History   Tobacco Use  . Smoking status: Never Smoker  . Smokeless tobacco: Never Used  Substance Use Topics  . Alcohol use: Yes    Alcohol/week: 0.6 oz    Types: 1 Cans of beer per week    Comment: occasionally maybe the weekends  . Drug use: No   Allergies Patient has no known allergies.  Review of Systems Review of Systems  Unable to perform ROS: Other  due to aphasia  Physical Exam Vital Signs  I have reviewed the triage vital signs BP 111/66   Pulse (!) 122   Temp 99 F (37.2 C)   Resp 11   Ht 6' (1.829 m)   Wt 86.2 kg (190 lb)   SpO2 98%   BMI 25.77 kg/m   Physical Exam  Constitutional: He is oriented to person, place, and time. He appears well-developed and well-nourished. No distress.  HENT:  Head: Normocephalic and atraumatic.  Nose: Nose normal.  Eyes: Conjunctivae and EOM are normal. Pupils are equal, round, and reactive to light. Right eye exhibits no discharge. Left eye exhibits no discharge. No scleral icterus.  Neck: Normal range of motion. Neck supple.  Cardiovascular: Normal rate. An irregularly irregular rhythm present. Exam reveals no gallop and no friction rub.  No murmur heard. Pulmonary/Chest: Effort normal and breath sounds normal. No stridor. No respiratory distress. He has no rales.  Abdominal: Soft. He exhibits no distension. There is no tenderness.  Musculoskeletal: He exhibits no edema or tenderness.  Neurological: He is alert and oriented to person, place, and time.  Skin: Skin is warm and dry. No rash noted. He is not diaphoretic. No erythema.  Psychiatric: He has a normal mood and affect.  Vitals reviewed.   ED Results and Treatments Labs (all labs ordered are listed, but only abnormal results are displayed) Labs Reviewed  CBC - Abnormal; Notable for the following components:      Result Value   WBC 12.0 (*)    RBC 4.14 (*)     Hemoglobin 12.8 (*)    HCT 38.3 (*)    All other components within normal limits  COMPREHENSIVE METABOLIC PANEL - Abnormal; Notable for the following components:   Glucose, Bld 115 (*)    Calcium 8.3 (*)    Total Protein 6.1 (*)    Albumin 3.3 (*)    All other components within normal limits  I-STAT TROPONIN, ED - Abnormal; Notable for the following components:   Troponin i, poc 0.89 (*)    All other components within normal limits                                                                                                                         EKG  EKG Interpretation  Date/Time:  Thursday March 29 2017 09:30:59 EST Ventricular Rate:  142 PR Interval:    QRS Duration: 86 QT Interval:  336 QTC Calculation: 537 R Axis:   66 Text Interpretation:  Atrial fibrillation  with flutter Lateral leads are also involved QTc calculation off due to flutter pattern Confirmed by Addison Lank [phone]) on 03/29/2017 11:19:44 AM       Radiology Ct Head Wo Contrast  Result Date: 03/29/2017 CLINICAL DATA:  Dizziness and confusion EXAM: CT HEAD WITHOUT CONTRAST TECHNIQUE: Contiguous axial images were obtained from the base of the skull through the  vertex without intravenous contrast. COMPARISON:  05/09/2015 FINDINGS: Brain: Encephalomalacia changes are noted consistent with prior left MCA infarct posteriorly. The overall appearance is stable from the prior exam. No acute hemorrhage is identified. No acute infarct or space-occupying mass lesion is seen. Vascular: No hyperdense vessel or unexpected calcification. Skull: Normal. Negative for fracture or focal lesion. Sinuses/Orbits: No acute finding. Other: None. IMPRESSION: Changes of prior remote left MCA infarct. No acute abnormality is noted. Electronically Signed   By: Inez Catalina M.D.   On: 03/29/2017 11:06   Dg Chest Port 1 View  Result Date: 03/29/2017 CLINICAL DATA:  Chest pain.  History of vascular disease. EXAM: PORTABLE CHEST 1 VIEW  COMPARISON:  02/11/2017 FINDINGS: Support devices overlie the chest. Heart size remains normal. Mediastinal shadows are normal by radiography. The vascularity is normal. Lungs are clear. No acute bone finding. IMPRESSION: No active disease by radiography.  Overlying artifact. Electronically Signed   By: Nelson Chimes M.D.   On: 03/29/2017 10:12   Pertinent labs & imaging results that were available during my care of the patient were reviewed by me and considered in my medical decision making (see chart for details).  Medications Ordered in ED Medications  etomidate (AMIDATE) injection 12 mg (12 mg Intravenous Not Given 03/29/17 1615)  apixaban (ELIQUIS) tablet 5 mg (5 mg Oral Given 03/29/17 1614)  levothyroxine (SYNTHROID, LEVOTHROID) tablet 25 mcg (not administered)  pregabalin (LYRICA) capsule 200 mg (200 mg Oral Given 03/29/17 1614)  traMADol (ULTRAM) tablet 100 mg (100 mg Oral Given 03/29/17 1614)  digoxin (LANOXIN) tablet 0.125 mg (not administered)  metoprolol succinate (TOPROL-XL) 24 hr tablet 12.5 mg (not administered)  sodium chloride 0.9 % bolus 1,000 mL (0 mLs Intravenous Stopped 03/29/17 1110)                                                                                                                                    Procedures Procedures   EMERGENCY DEPARTMENT [place] CARDIAC EXAM "Study: Limited Ultrasound of the Heart and Pericardium"  INDICATIONS:Abnormal vital signs and Chest pain Multiple views of the heart and pericardium were obtained in real-time with a multi-frequency probe.  PERFORMED HW:EXHBZJ IMAGES ARCHIVED?: Yes LIMITATIONS:  Body habitus VIEWS USED: Subcostal 4 chamber, Parasternal long axis and Parasternal short axis INTERPRETATION: Cardiac activity present, Pericardial effusioin absent, Cardiac tamponade absent and irregular contractions  CRITICAL CARE Performed by: Grayce Sessions Amyri Frenz Total critical care time: 55 minutes Critical care time was exclusive of  separately billable procedures and treating other patients. Critical care was necessary to treat or prevent imminent or life-threatening deterioration. Critical care was time spent personally by me on the following activities: development of treatment plan with patient and/or surrogate as well as nursing, discussions with consultants, evaluation of patient's response to treatment, examination of patient, obtaining history from patient or surrogate, ordering and performing treatments and interventions, ordering and review of laboratory studies, ordering and  review of radiographic studies, pulse oximetry and re-evaluation of patient's condition.    (including critical care time)  Medical Decision Making / ED Course I have reviewed the nursing notes for this encounter and the patient's prior records (if available in EHR or on provided paperwork).  Clinical Course as of Mar 30 1619  Thu Mar 29, 2017  1010 Patient with A. fib/atrial flutter and RVR.  Status post ablation.  Properly anticoagulated on Eliquis.  Discussed case with Dr. Lovena Le from cardiology who agreed with cardioversion and if successful outpatient clinic follow-up.  Given the patient's a aphasia will await for the wife to arrive to obtain consent.  We will obtain screening labs and provide patient with IV fluids.  [PC]  4098 Wife reported that the patient had a near syncopal episode, resulting in fall with possible head trauma.  Given his anticoagulation will obtain a CT scan prior to cardioversion.  [PC]  1110 CT scan negative for any ICH.  [PC]  1191 Pt converted to NSR after IVF. Will monitor for a couple of hours and reassess  [PC]  1120 Troponin was elevated but this is likely due to recent ablation.  Now that the patient has converted to normal sinus rhythm he is asymptomatic.  [PC]  4782 Patient went into A. fib RVR again.  [PC]  1203 Converted back to normal sinus rhythm on his own.  [PC]  9562 Patient reverted back to A. fib  RVR.  Will consult cardiology again given the fluctuating rhythms.  [PC]  1308 Patient converted back to normal sinus rhythm on his own.  [PC]  15 Spoke with Dr. Sallyanne Kuster who will evaluate the patient here in the ED.  [PC]  1618 Patient was seen by Dr. Sallyanne Kuster who recommended starting patient on a low-dose metoprolol and digoxin.  They will set up close follow-up with the patient in the clinic.  Patient and family amenable to this plan.    [PC]    Clinical Course User Index [PC] Pasqualina Colasurdo, Grayce Sessions, MD     Final Clinical Impression(s) / ED Diagnoses Final diagnoses:  Chest pain  Paroxysmal atrial fibrillation with rapid ventricular response ([place])  Paroxysmal atrial flutter ([facility])    Disposition: Discharge  Condition: Good  I have discussed the results, Dx and Tx plan with the patient and wife who expressed understanding and agree(s) with the plan. Discharge instructions discussed at great length. The patient and wife was given strict return precautions who verbalized understanding of the instructions. No further questions at time of discharge.    ED Discharge Orders        Ordered    metoprolol succinate (TOPROL-XL) 25 MG 24 hr tablet  Daily     03/29/17 1618    digoxin (LANOXIN) 0.125 MG tablet  Daily     03/29/17 1618       Follow Up: [name], MD [place] [id number] [phone]   in 3-5 days, For close follow up to assess for recurrent dysrrhythmia after ablation.     This chart was dictated using voice recognition software.  Despite best efforts to proofread,  errors can occur which can change the documentation meaning.   Fatima Blank, MD 03/29/17 [phone]

## 2017-03-29 NOTE — ED Notes (Signed)
While nurse in room with patient assisting with urinal patient HR decreased EKG taken and given to EDP. Patient states feel better and denies chest pain.

## 2017-03-29 NOTE — Telephone Encounter (Signed)
Received fax from [place] stating they denied a tier reduction for ELIQUIS. "Your drug is a brand name medication. It is already on the lowest copay level for a brand drug. Therefore we cannot cover your drug at a lower price".

## 2017-03-29 NOTE — ED Notes (Signed)
Doctor performing ultrasound at bedside.

## 2017-03-29 NOTE — ED Triage Notes (Signed)
Arrived via EMS patient developed chest pressure middle of the night and then general weakness. Prior to arrival patient received a total of 3 nitro SL and aspirin 324 mg.  Alert to normal.

## 2017-03-29 NOTE — Consult Note (Signed)
Cardiology Consultation:   Patient ID: [name]; [id number]; 22-Mar-1955   Admit date: 03/29/2017 Date of Consult: 03/29/2017  Primary Care Provider: Hulan Fess, MD Primary Cardiologist: No primary care provider on file. Fransico Him, MD Primary Electrophysiologist:  [name], MD   Patient Profile:   [name] is a 62 y.o. male with a hx of atrial fibrillation who is being seen today for the evaluation of atrial fibrillation with RVR  at the request of Dr. Leonette Monarch.  History of Present Illness:   [name] has a long-standing history of atrial fibrillation, carotid artery stenosis and previous stroke.    Initially diagnosed as a paroxysmal arrhythmia in 2011 during nephrostomy for nephrolithiasis, the atrial fibrillation was subsequently well managed with metoprolol.  In 2016 atrial fibrillation recurred and gradually increased in burden. The metoprolol had to be reduced and eventually discontinued due to problems with hypotension and bradycardia.  During the same period of time he underwent right carotid endarterectomy in May 2018.  He has known chronic occlusion of the left internal carotid artery.  The atrial fibrillation became very symptomatic.  Options including antiarrhythmic therapy with Tikosyn and ablation were discussed and he underwent ablation on March 27, 2017.  He now returns with atrial fibrillation rapid ventricular response with symptoms of chest pressure and near syncope.  His heart rate was around 150 bpm.  Reparations were being made for cardioversion in the emergency room, but he spontaneously converted to sinus rhythm with a rate of approximately 70 bpm.  When he got out of bed the atrial fibrillation recurred again with rapid ventricular response.  He has been in and out of atrial fibrillation 2 or 3 times this afternoon, but now seems to have settled in sinus rhythm for the last couple of hours without interruption.  He has occasional PACs.   He feels much better.  He is compliant with anticoagulation and has not had any bleeding problems or any recent neurological new complaints.  He does have some residual aphasia and strength impairment from his previous stroke in 2016.  Past Medical History:  Diagnosis Date  . Altered sensation due to stroke 12/10/2014  . 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 Basal cell carcinoma   . Bradycardia 10/21/2015  . Carotid artery stenosis    a. known occluded left ICA stenosis, s/p right CEA on 07/05/2016.  Marland Kitchen Contusion of calf, left, initial encounter 12/03/2015  . Coronary artery disease   . 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 Dilated aortic root ([place]) 10/20/2015  . 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 Hyperlipidemia LDL goal <70 05/31/2016  . ICAO (internal carotid artery occlusion), left 07/10/2016  . 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 Mild mitral regurgitation   . Nephrolithiasis   . Neuropathy   . PAF (paroxysmal atrial fibrillation) ([place]) 07/10/2016  . Typical atrial flutter [facility])     Past Surgical History:  Procedure Laterality Date  . ATRIAL FIBRILLATION ABLATION N/A 03/27/2017   Procedure: ATRIAL FIBRILLATION ABLATION;  Surgeon: [name], MD;  Location: [place] [facility];  Service: Cardiovascular;  Laterality: N/A;  . LITHOTRIPSY    . VASECTOMY       Home Medications:  Prior to Admission medications   Medication Sig Start Date End Date Taking? Authorizing Provider  acetaminophen (TYLENOL) 500 MG tablet Take 1,000 mg by mouth every 6 (six) hours as needed (for pain/headaches.).  Yes [provider]  apixaban (ELIQUIS) 5 MG TABS tablet Take 5 mg by mouth 2 (two) times daily.   Yes [provider]  aspirin EC 81 MG tablet Take 81 mg by mouth daily.   Yes [provider]  atorvastatin (LIPITOR) 40 MG tablet TAKE 1 TABLET DAILY AT 6 P.M. Patient taking differently: TAKE  40 mg TABLET DAILY WITH SUPPER 10/31/16  Yes Turner, Eber Hong, MD  fluticasone (FLONASE) 50 MCG/ACT nasal spray Place 1 spray into both nostrils at bedtime.    Yes [provider]  levothyroxine (SYNTHROID, LEVOTHROID) 25 MCG tablet Take 25 mcg by mouth daily before breakfast.  05/04/15  Yes [provider]  pantoprazole (PROTONIX) 40 MG tablet Take 1 tablet (40 mg total) by mouth daily. 03/27/17 03/27/18 Yes Seiler, Amber K, NP  polyethylene glycol (MIRALAX / GLYCOLAX) packet Take 17 g by mouth daily.   Yes [provider]  pregabalin (LYRICA) 200 MG capsule Take 1 capsule (200 mg total) by mouth 3 (three) times daily. 03/09/17  Yes Kirsteins, Luanna Salk, MD  Probiotic Product (PROBIOTIC PO) Take 1 tablet by mouth at bedtime.   Yes [provider]  senna (SENOKOT) 8.6 MG tablet Take 2 tablets by mouth 2 (two) times daily. Bedtime    Yes [provider]  traMADol (ULTRAM) 50 MG tablet Take 2 tablets (100 mg total) by mouth every 6 (six) hours as needed. Patient taking differently: Take 100 mg by mouth 4 (four) times daily.  03/12/17  Yes Kirsteins, Luanna Salk, MD  digoxin (LANOXIN) 0.125 MG tablet Take 1 tablet (0.125 mg total) by mouth daily. 03/29/17 04/28/17  Fatima Blank, MD  metoprolol succinate (TOPROL-XL) 25 MG 24 hr tablet Take 0.5 tablets (12.5 mg total) by mouth daily. 03/29/17 04/28/17  Fatima Blank, MD    Inpatient Medications: Scheduled Meds: . apixaban  5 mg Oral BID  . digoxin  0.125 mg Oral Daily  . etomidate  12 mg Intravenous Once  . [START ON 03/30/2017] levothyroxine  25 mcg Oral QAC breakfast  . metoprolol succinate  12.5 mg Oral Daily  . pregabalin  200 mg Oral TID   Continuous Infusions:  PRN Meds: traMADol  Allergies:   No Known Allergies  Social History:   Social History   Socioeconomic History  . Marital status: Married    Spouse name: Not on file  . Number of children: Not on file  . Years of education: Not on  file  . Highest education level: Not on file  Social Needs  . Financial resource strain: Not on file  . Food insecurity - worry: Not on file  . Food insecurity - inability: Not on file  . Transportation needs - medical: Not on file  . Transportation needs - non-medical: Not on file  Occupational History  . Not on file  Tobacco Use  . Smoking status: Never Smoker  . Smokeless tobacco: Never Used  Substance and Sexual Activity  . Alcohol use: Yes    Alcohol/week: 0.6 oz    Types: 1 Cans of beer per week    Comment: occasionally maybe the weekends  . Drug use: No  . Sexual activity: Not on file  Other Topics Concern  . Not on file  Social History Narrative  . Not on file    Family History:    Family History  Problem Relation Age of Onset  . Cancer Mother   . Heart failure Father  ROS:  Please see the history of present illness.   All other ROS reviewed and negative.     Physical Exam/Data:   Vitals:   03/29/17 1613 03/29/17 1615 03/29/17 1618 03/29/17 1622  BP:  123/73    Pulse: 75 70 80 77  Resp: 18 11 16    Temp:      SpO2: 98% 98% 100%   Weight:      Height:        Intake/Output Summary (Last 24 hours) at 03/29/2017 1734 Last data filed at 03/29/2017 1110 Gross per 24 hour  Intake 1000 ml  Output -  Net 1000 ml   Filed Weights   03/29/17 0938  Weight: 190 lb (86.2 kg)   Body mass index is 25.77 kg/m.  General:  Well nourished, well developed, in no acute distress HEENT: normal Lymph: no adenopathy Neck: no JVD Endocrine:  No thryomegaly Vascular: No carotid bruits; FA pulses 2+ bilaterally without bruits  Cardiac:  normal S1, S2; RRR; no murmur  Lungs:  clear to auscultation bilaterally, no wheezing, rhonchi or rales  Abd: soft, nontender, no hepatomegaly  Ext: no edema Musculoskeletal:  No deformities, BUE and BLE strength normal and equal Skin: warm and dry  Neuro:  CNs 2-12 intact, no focal abnormalities noted Psych:  Normal affect    EKG:  The EKG was personally reviewed and demonstrates:   At 0930, atrial fibrillation with very coarse fibrillatory waves, rapid ventricular response At 1124, sinus rhythm with frequent PACs At 1144, normal sinus rhythm, normal tracing Telemetry:  Telemetry was personally reviewed and demonstrates: Normal sinus rhythm  Relevant CV Studies: Echo 01/26/2017 - Left ventricle: The cavity size was normal. There was mild focal   basal hypertrophy of the septum. Systolic function was normal.   The estimated ejection fraction was in the range of 55% to 60%.   Wall motion was normal; there were no regional wall motion   abnormalities.  Impressions:  - Normal LV systolic function; trace MR and TR.  Both atria "normal in size".  Laboratory Data:  Chemistry Recent Labs  Lab 03/29/17 0942  NA 136  K 3.8  CL 104  CO2 24  GLUCOSE 115*  BUN 10  CREATININE 0.85  CALCIUM 8.3*  GFRNONAA >60  GFRAA >60  ANIONGAP 8    Recent Labs  Lab 03/29/17 0942  PROT 6.1*  ALBUMIN 3.3*  AST 29  ALT 22  ALKPHOS 57  BILITOT 0.8   Hematology Recent Labs  Lab 03/29/17 0942  WBC 12.0*  RBC 4.14*  HGB 12.8*  HCT 38.3*  MCV 92.5  MCH 30.9  MCHC 33.4  RDW 12.5  PLT 197   Cardiac EnzymesNo results for input(s): TROPONINI in the last 168 hours.  Recent Labs  Lab 03/29/17 1054  TROPIPOC 0.89*    BNPNo results for input(s): BNP, PROBNP in the last 168 hours.  DDimer No results for input(s): DDIMER in the last 168 hours.  Radiology/Studies:  Ct Head Wo Contrast  Result Date: 03/29/2017 CLINICAL DATA:  Dizziness and confusion EXAM: CT HEAD WITHOUT CONTRAST TECHNIQUE: Contiguous axial images were obtained from the base of the skull through the vertex without intravenous contrast. COMPARISON:  05/09/2015 FINDINGS: Brain: Encephalomalacia changes are noted consistent with prior left MCA infarct posteriorly. The overall appearance is stable from the prior exam. No acute hemorrhage is  identified. No acute infarct or space-occupying mass lesion is seen. Vascular: No hyperdense vessel or unexpected calcification. Skull: Normal. Negative for fracture  or focal lesion. Sinuses/Orbits: No acute finding. Other: None. IMPRESSION: Changes of prior remote left MCA infarct. No acute abnormality is noted. Electronically Signed   By: Inez Catalina M.D.   On: 03/29/2017 11:06   Dg Chest Port 1 View  Result Date: 03/29/2017 CLINICAL DATA:  Chest pain.  History of vascular disease. EXAM: PORTABLE CHEST 1 VIEW COMPARISON:  02/11/2017 FINDINGS: Support devices overlie the chest. Heart size remains normal. Mediastinal shadows are normal by radiography. The vascularity is normal. Lungs are clear. No acute bone finding. IMPRESSION: No active disease by radiography.  Overlying artifact. Electronically Signed   By: Nelson Chimes M.D.   On: 03/29/2017 10:12    Assessment and Plan:   1. Atrial fibrillation with RVR: Symptomatic due to tachycardia.  Previously intolerant of higher doses of beta-blocker due to hypotension and bradycardia.  Heart rate right now is 70 and his blood pressure will allow a low dose of beta-blocker.  We will start metoprolol succinate 12.5 mg daily.  This is unlikely to be sufficient for rate control by itself and also start digoxin 0.125 mg daily.  Flecainide is considered but may not be safe in this patient with well-established carotid atherosclerosis.  Discussed Tikosyn, which will require hospitalization.  He would rather not do this.  Other antiarrhythmics are likely to worsen his tendency to bradycardia and would push [place] towards pacemaker implantation, which she also wants to avoid.  It is likely that his atrial arrhythmia will gradually subside over the next 3 months after his ablation and he would like to try to temporize before making major changes in his therapeutic approach during this time.  Did ask him to return to the emergency room if he has persistent tachycardia with  chest pain, dyspnea or near syncope/syncope.  We will make arrangements for him to come back to the A. fib clinic sooner.  Continue anticoagulation without interruption..   For questions or updates, please contact [place] Please consult [email or web address] for contact info under Cardiology/STEMI.   Signed, Sanda Klein, MD  03/29/2017 5:34 PM

## 2017-03-29 NOTE — ED Notes (Signed)
Doctor at bedside.

## 2017-03-29 NOTE — ED Notes (Signed)
2 EKG completed the second was needed because the first has reversal leads.

## 2017-03-29 NOTE — ED Notes (Signed)
Test was cancel per doctors orders.

## 2017-03-29 NOTE — ED Notes (Signed)
ED Provider at bedside. 

## 2017-04-03 ENCOUNTER — Encounter (HOSPITAL_COMMUNITY): Payer: Self-pay | Admitting: Nurse Practitioner

## 2017-04-03 ENCOUNTER — Ambulatory Visit (HOSPITAL_COMMUNITY)
Admission: RE | Admit: 2017-04-03 | Discharge: 2017-04-03 | Disposition: A | Discharge status: Home / Self Care | Payer: Medicare HMO | Source: Ambulatory Visit | Attending: Nurse Practitioner | Admitting: Nurse Practitioner

## 2017-04-03 ENCOUNTER — Telehealth: Payer: Self-pay | Admitting: Physical Medicine & Rehabilitation

## 2017-04-03 VITALS — BP 104/62 | HR 59 | Ht 72.0 in | Wt 195.0 lb

## 2017-04-03 DIAGNOSIS — G629 Polyneuropathy, unspecified: Secondary | ICD-10-CM | POA: Insufficient documentation

## 2017-04-03 DIAGNOSIS — I483 Typical atrial flutter: Secondary | ICD-10-CM | POA: Insufficient documentation

## 2017-04-03 DIAGNOSIS — E785 Hyperlipidemia, unspecified: Secondary | ICD-10-CM | POA: Diagnosis not present

## 2017-04-03 DIAGNOSIS — Z7902 Long term (current) use of antithrombotics/antiplatelets: Secondary | ICD-10-CM | POA: Insufficient documentation

## 2017-04-03 DIAGNOSIS — Z9889 Other specified postprocedural states: Secondary | ICD-10-CM | POA: Insufficient documentation

## 2017-04-03 DIAGNOSIS — Z87442 Personal history of urinary calculi: Secondary | ICD-10-CM | POA: Diagnosis not present

## 2017-04-03 DIAGNOSIS — I6522 Occlusion and stenosis of left carotid artery: Secondary | ICD-10-CM | POA: Insufficient documentation

## 2017-04-03 DIAGNOSIS — Z7982 Long term (current) use of aspirin: Secondary | ICD-10-CM | POA: Diagnosis not present

## 2017-04-03 DIAGNOSIS — Z9852 Vasectomy status: Secondary | ICD-10-CM | POA: Insufficient documentation

## 2017-04-03 DIAGNOSIS — I480 Paroxysmal atrial fibrillation: Secondary | ICD-10-CM | POA: Insufficient documentation

## 2017-04-03 DIAGNOSIS — I2510 Atherosclerotic heart disease of native coronary artery without angina pectoris: Secondary | ICD-10-CM | POA: Diagnosis not present

## 2017-04-03 DIAGNOSIS — Z8673 Personal history of transient ischemic attack (TIA), and cerebral infarction without residual deficits: Secondary | ICD-10-CM | POA: Insufficient documentation

## 2017-04-03 DIAGNOSIS — Z79899 Other long term (current) drug therapy: Secondary | ICD-10-CM | POA: Insufficient documentation

## 2017-04-03 DIAGNOSIS — Z809 Family history of malignant neoplasm, unspecified: Secondary | ICD-10-CM | POA: Diagnosis not present

## 2017-04-03 DIAGNOSIS — Z8249 Family history of ischemic heart disease and other diseases of the circulatory system: Secondary | ICD-10-CM | POA: Insufficient documentation

## 2017-04-03 DIAGNOSIS — Z8679 Personal history of other diseases of the circulatory system: Secondary | ICD-10-CM | POA: Insufficient documentation

## 2017-04-03 DIAGNOSIS — Z85828 Personal history of other malignant neoplasm of skin: Secondary | ICD-10-CM | POA: Diagnosis not present

## 2017-04-03 NOTE — Progress Notes (Signed)
Primary Care Physician: Hulan Fess, MD Referring Physician: ER f/u Cardiologist: Dr. Jorge Mandril Matthew is a 62 y.o. Glass with a h/o afib s/p ablation 03/27/17. He was seen in the ER for afib with RVR with syncope 1/31. He was going in and out of afib and was  d/c home with the addition of digoxin 2/2  soft BP's.  He is in the afib clinic for f/u. He has some soreness of his chest wall that is improved since after surgery, He is in SR and no further presyncope/suncope issues. He has not noted any further afib since ER.  He and his wife had a death in the family in [place]. Were thinking they may go to the funeral but with a 9 hour drive, they think the will just stay home. No swallowing or groin issues.  Today, he denies symptoms of palpitations, chest pain, shortness of breath, orthopnea, PND, lower extremity edema, dizziness, presyncope, syncope, or neurologic sequela. The patient is tolerating medications without difficulties and is otherwise without complaint today.   Past Medical History:  Diagnosis Date  . Altered sensation due to stroke 12/10/2014  . 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 Basal cell carcinoma   . Bradycardia 10/21/2015  . Carotid artery stenosis    a. known occluded left ICA stenosis, s/p right CEA on 07/05/2016.  Marland Kitchen Contusion of calf, left, initial encounter 12/03/2015  . Coronary artery disease   . 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 Dilated aortic root ([place]) 10/20/2015  . 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 Hyperlipidemia LDL goal <70 05/31/2016  . ICAO (internal carotid artery occlusion), left 07/10/2016  . 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 Mild mitral regurgitation   . Nephrolithiasis   . Neuropathy   . PAF (paroxysmal atrial fibrillation) ([place]) 07/10/2016  . Typical atrial flutter [facility])    Past Surgical History:  Procedure Laterality Date  . ATRIAL FIBRILLATION  ABLATION N/A 03/27/2017   Procedure: ATRIAL FIBRILLATION ABLATION;  Surgeon: [name], MD;  Location: [facility] [facility];  Service: Cardiovascular;  Laterality: N/A;  . LITHOTRIPSY    . VASECTOMY      Current Outpatient Medications  Medication Sig Dispense Refill  . acetaminophen (TYLENOL) 500 MG tablet Take 1,000 mg by mouth every 6 (six) hours as needed (for pain/headaches.).    Marland Kitchen apixaban (ELIQUIS) 5 MG TABS tablet Take 5 mg by mouth 2 (two) times daily.    Marland Kitchen aspirin EC 81 MG tablet Take 81 mg by mouth daily.    Marland Kitchen atorvastatin (LIPITOR) 40 MG tablet TAKE 1 TABLET DAILY AT 6 P.M. (Patient taking differently: TAKE 40 mg TABLET DAILY WITH SUPPER) 90 tablet 1  . digoxin (LANOXIN) 0.125 MG tablet Take 1 tablet (0.125 mg total) by mouth daily. 30 tablet 0  . fluticasone (FLONASE) 50 MCG/ACT nasal spray Place 1 spray into both nostrils at bedtime.     Marland Kitchen levothyroxine (SYNTHROID, LEVOTHROID) 25 MCG tablet Take 25 mcg by mouth daily before breakfast.     . metoprolol succinate (TOPROL-XL) 25 MG 24 hr tablet Take 0.5 tablets (12.5 mg total) by mouth daily. 15 tablet 0  . pantoprazole (PROTONIX) 40 MG tablet Take 1 tablet (40 mg total) by mouth daily. 30 tablet 1  . polyethylene glycol (MIRALAX / GLYCOLAX) packet Take 17 g by mouth daily.    . pregabalin (LYRICA) 200 MG  capsule Take 1 capsule (200 mg total) by mouth 3 (three) times daily. 270 capsule 1  . Probiotic Product (PROBIOTIC PO) Take 1 tablet by mouth at bedtime.    . senna (SENOKOT) 8.6 MG tablet Take 2 tablets by mouth 2 (two) times daily. Bedtime     . traMADol (ULTRAM) 50 MG tablet Take 2 tablets (100 mg total) by mouth every 6 (six) hours as needed. (Patient taking differently: Take 100 mg by mouth 4 (four) times daily. ) 240 tablet 0   No current facility-administered medications for this encounter.     No Known Allergies  Social History   Socioeconomic History  . Marital status: Married    Spouse name: Not on file  .  Number of children: Not on file  . Years of education: Not on file  . Highest education level: Not on file  Social Needs  . Financial resource strain: Not on file  . Food insecurity - worry: Not on file  . Food insecurity - inability: Not on file  . Transportation needs - medical: Not on file  . Transportation needs - non-medical: Not on file  Occupational History  . Not on file  Tobacco Use  . Smoking status: Never Smoker  . Smokeless tobacco: Never Used  Substance and Sexual Activity  . Alcohol use: Yes    Alcohol/week: 0.6 oz    Types: 1 Cans of beer per week    Comment: occasionally maybe the weekends  . Drug use: No  . Sexual activity: Not on file  Other Topics Concern  . Not on file  Social History Narrative  . Not on file    Family History  Problem Relation Age of Onset  . Cancer Mother   . Heart failure Father     ROS- All systems are reviewed and negative except as per the HPI above  Physical Exam: Vitals:   04/03/17 0932  BP: 104/62  Pulse: (!) 59  Weight: 195 lb (88.5 kg)  Height: 6' (1.829 m)   Wt Readings from Last 3 Encounters:  04/03/17 195 lb (88.5 kg)  03/29/17 190 lb (86.2 kg)  03/27/17 190 lb (86.2 kg)    Labs: Lab Results  Component Value Date   NA 136 03/29/2017   K 3.8 03/29/2017   CL 104 03/29/2017   CO2 24 03/29/2017   GLUCOSE 115 (H) 03/29/2017   BUN 10 03/29/2017   CREATININE 0.85 03/29/2017   CALCIUM 8.3 (L) 03/29/2017   MG 2.4 (H) 01/26/2017   Lab Results  Component Value Date   INR 1.17 09/24/2014   Lab Results  Component Value Date   CHOL 108 06/22/2016   HDL 29 (L) 06/22/2016   LDLCALC 64 06/22/2016   TRIG 75 06/22/2016     GEN- The patient is well appearing, alert and oriented x 3 today.   Head- normocephalic, atraumatic Eyes-  Sclera clear, conjunctiva pink Ears- hearing intact Oropharynx- clear Neck- supple, no JVP Lymph- no cervical lymphadenopathy Lungs- Clear to ausculation bilaterally, normal work  of breathing Heart- Regular rate and rhythm, no murmurs, rubs or gallops, PMI not laterally displaced GI- soft, NT, ND, + BS Extremities- no clubbing, cyanosis, or edema MS- no significant deformity or atrophy Skin- no rash or lesion Psych- euthymic mood, full affect Neuro- strength and sensation are intact  EKG-Sinus brady at 59 bpm, pr int 154 ms, qrs int 90 ms, qtc 413 ms Epic records reviewed    Assessment and Plan: 1. Paroxysmal atrial  fibrillation S/p ablation, now in SR after having RVR and contributing to a syncopal episode Is regaining  his strenght and resuming some of his normal activities Can use tylenol for chest discomfort Continue  eliquis 5 mg bid for chadsvasc score of at least 3 Continue metoprolol and digoxin without change  F/u here in one month  Butch Penny C. Carroll, [facility] [address] [place], [place] [address] [phone]

## 2017-04-03 NOTE — Telephone Encounter (Signed)
Phone call for Ninfa Linden has not received prior authorization on Tramadol.  [name] is saying this needs to be faxed to [phone].  Any questions please call [name] (patient's wife).

## 2017-04-04 ENCOUNTER — Ambulatory Visit (HOSPITAL_COMMUNITY): Payer: Medicare HMO | Admitting: Nurse Practitioner

## 2017-04-04 MED ORDER — TRAMADOL HCL 50 MG PO TABS: 100.0000 mg | ORAL_TABLET | Freq: Four times a day (QID) | ORAL | 1 refills | Status: DC

## 2017-04-04 NOTE — Addendum Note (Signed)
Addended by: Geryl Rankins D on: 04/04/2017 03:19 PM   Modules accepted: Orders

## 2017-04-12 DIAGNOSIS — I69320 Aphasia following cerebral infarction: Secondary | ICD-10-CM | POA: Diagnosis not present

## 2017-04-12 DIAGNOSIS — I639 Cerebral infarction, unspecified: Secondary | ICD-10-CM | POA: Diagnosis not present

## 2017-04-12 DIAGNOSIS — R4701 Aphasia: Secondary | ICD-10-CM | POA: Diagnosis not present

## 2017-04-12 DIAGNOSIS — R69 Illness, unspecified: Secondary | ICD-10-CM | POA: Diagnosis not present

## 2017-04-23 ENCOUNTER — Other Ambulatory Visit: Payer: Self-pay

## 2017-04-23 ENCOUNTER — Encounter (HOSPITAL_COMMUNITY): Payer: Self-pay

## 2017-04-23 ENCOUNTER — Emergency Department (HOSPITAL_COMMUNITY)
Admission: EM | Admit: 2017-04-23 | Discharge: 2017-04-23 | Disposition: A | Discharge status: Home / Self Care | Payer: Medicare HMO | Attending: Emergency Medicine | Admitting: Emergency Medicine

## 2017-04-23 DIAGNOSIS — H9311 Tinnitus, right ear: Secondary | ICD-10-CM | POA: Diagnosis not present

## 2017-04-23 DIAGNOSIS — H6123 Impacted cerumen, bilateral: Secondary | ICD-10-CM | POA: Diagnosis not present

## 2017-04-23 DIAGNOSIS — H7293 Unspecified perforation of tympanic membrane, bilateral: Secondary | ICD-10-CM | POA: Diagnosis not present

## 2017-04-23 DIAGNOSIS — H9313 Tinnitus, bilateral: Secondary | ICD-10-CM | POA: Insufficient documentation

## 2017-04-23 DIAGNOSIS — Z5321 Procedure and treatment not carried out due to patient leaving prior to being seen by health care provider: Secondary | ICD-10-CM | POA: Diagnosis not present

## 2017-04-23 NOTE — ED Notes (Signed)
Pt comes to registration desk and has decided to leave.

## 2017-04-23 NOTE — ED Triage Notes (Signed)
Pt states that he hit his head on Friday afternoon, tonight was woke up from his sleep with ringing in his ears. C/o not being about to hear, denies dizziness

## 2017-04-26 ENCOUNTER — Encounter (HOSPITAL_COMMUNITY): Payer: Self-pay | Admitting: Nurse Practitioner

## 2017-04-26 ENCOUNTER — Ambulatory Visit (HOSPITAL_COMMUNITY): Payer: Medicare HMO | Admitting: Nurse Practitioner

## 2017-04-26 ENCOUNTER — Ambulatory Visit (HOSPITAL_COMMUNITY)
Admission: RE | Admit: 2017-04-26 | Discharge: 2017-04-26 | Disposition: A | Discharge status: Home / Self Care | Payer: Medicare HMO | Source: Ambulatory Visit | Attending: Nurse Practitioner | Admitting: Nurse Practitioner

## 2017-04-26 VITALS — BP 142/82 | HR 65 | Ht 72.0 in | Wt 199.8 lb

## 2017-04-26 DIAGNOSIS — H6123 Impacted cerumen, bilateral: Secondary | ICD-10-CM | POA: Diagnosis not present

## 2017-04-26 DIAGNOSIS — Z8249 Family history of ischemic heart disease and other diseases of the circulatory system: Secondary | ICD-10-CM | POA: Diagnosis not present

## 2017-04-26 DIAGNOSIS — Z9889 Other specified postprocedural states: Secondary | ICD-10-CM | POA: Insufficient documentation

## 2017-04-26 DIAGNOSIS — R55 Syncope and collapse: Secondary | ICD-10-CM | POA: Insufficient documentation

## 2017-04-26 DIAGNOSIS — G629 Polyneuropathy, unspecified: Secondary | ICD-10-CM | POA: Diagnosis not present

## 2017-04-26 DIAGNOSIS — Z79891 Long term (current) use of opiate analgesic: Secondary | ICD-10-CM | POA: Insufficient documentation

## 2017-04-26 DIAGNOSIS — I480 Paroxysmal atrial fibrillation: Secondary | ICD-10-CM | POA: Insufficient documentation

## 2017-04-26 DIAGNOSIS — Z7901 Long term (current) use of anticoagulants: Secondary | ICD-10-CM | POA: Insufficient documentation

## 2017-04-26 DIAGNOSIS — Z85828 Personal history of other malignant neoplasm of skin: Secondary | ICD-10-CM | POA: Insufficient documentation

## 2017-04-26 DIAGNOSIS — Z7982 Long term (current) use of aspirin: Secondary | ICD-10-CM | POA: Insufficient documentation

## 2017-04-26 DIAGNOSIS — J3089 Other allergic rhinitis: Secondary | ICD-10-CM | POA: Insufficient documentation

## 2017-04-26 DIAGNOSIS — Z8673 Personal history of transient ischemic attack (TIA), and cerebral infarction without residual deficits: Secondary | ICD-10-CM | POA: Diagnosis not present

## 2017-04-26 DIAGNOSIS — Z79899 Other long term (current) drug therapy: Secondary | ICD-10-CM | POA: Insufficient documentation

## 2017-04-26 DIAGNOSIS — Z809 Family history of malignant neoplasm, unspecified: Secondary | ICD-10-CM | POA: Diagnosis not present

## 2017-04-26 DIAGNOSIS — I483 Typical atrial flutter: Secondary | ICD-10-CM | POA: Diagnosis not present

## 2017-04-26 DIAGNOSIS — E785 Hyperlipidemia, unspecified: Secondary | ICD-10-CM | POA: Diagnosis not present

## 2017-04-26 DIAGNOSIS — I2510 Atherosclerotic heart disease of native coronary artery without angina pectoris: Secondary | ICD-10-CM | POA: Insufficient documentation

## 2017-04-26 NOTE — Progress Notes (Signed)
Primary Care Physician: Hulan Fess, MD Referring Physician: ER f/u Cardiologist: Dr. Jorge Mandril [name] is a 62 y.o. male with a h/o afib s/p ablation 03/27/17. He was seen in the ER for afib with RVR with syncope 1/31. He was going in and out of afib and was  d/c home with the addition of digoxin/ low dose BB, 2/2  soft BP's.  He is in the afib clinic for f/u. He has some soreness of his chest wall that is improved since after surgery, He is in SR and no further presyncope/suncope issues. He has not noted any further afib since ER.  He and his wife had a death in the family in [place]. Were thinking they may go to the funeral but with a 9 hour drive, they think the will just stay home. No swallowing or groin issues.  F/u afib clinic 2/28, one month following ablation. No further issues with fast heart rates. Feeling improved.  Today, he denies symptoms of palpitations, chest pain, shortness of breath, orthopnea, PND, lower extremity edema, dizziness, presyncope, syncope, or neurologic sequela. The patient is tolerating medications without difficulties and is otherwise without complaint today.   Past Medical History:  Diagnosis Date  . Altered sensation due to stroke 12/10/2014  . 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 Basal cell carcinoma   . Bradycardia 10/21/2015  . Carotid artery stenosis    a. known occluded left ICA stenosis, s/p right CEA on 07/05/2016.  Marland Kitchen Contusion of calf, left, initial encounter 12/03/2015  . Coronary artery disease   . 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 Dilated aortic root ([place]) 10/20/2015  . 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 Hyperlipidemia LDL goal <70 05/31/2016  . ICAO (internal carotid artery occlusion), left 07/10/2016  . 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 Mild mitral regurgitation   . Nephrolithiasis   . Neuropathy   . PAF (paroxysmal atrial fibrillation)  ([place]) 07/10/2016  . Typical atrial flutter [facility])    Past Surgical History:  Procedure Laterality Date  . ATRIAL FIBRILLATION ABLATION N/A 03/27/2017   Procedure: ATRIAL FIBRILLATION ABLATION;  Surgeon: [name], MD;  Location: [place] [facility];  Service: Cardiovascular;  Laterality: N/A;  . LITHOTRIPSY    . VASECTOMY      Current Outpatient Medications  Medication Sig Dispense Refill  . acetaminophen (TYLENOL) 500 MG tablet Take 1,000 mg by mouth every 6 (six) hours as needed (for pain/headaches.).    Marland Kitchen apixaban (ELIQUIS) 5 MG TABS tablet Take 5 mg by mouth 2 (two) times daily.    Marland Kitchen aspirin EC 81 MG tablet Take 81 mg by mouth daily.    Marland Kitchen atorvastatin (LIPITOR) 40 MG tablet TAKE 1 TABLET DAILY AT 6 P.M. (Patient taking differently: TAKE 40 mg TABLET DAILY WITH SUPPER) 90 tablet 1  . fluticasone (FLONASE) 50 MCG/ACT nasal spray Place 1 spray into both nostrils at bedtime.     Marland Kitchen levothyroxine (SYNTHROID, LEVOTHROID) 25 MCG tablet Take 25 mcg by mouth daily before breakfast.     . metoprolol succinate (TOPROL-XL) 25 MG 24 hr tablet Take 0.5 tablets (12.5 mg total) by mouth daily. 15 tablet 0  . pantoprazole (PROTONIX) 40 MG tablet Take 1 tablet (40 mg total) by mouth daily. 30 tablet 1  . polyethylene glycol (MIRALAX / GLYCOLAX) packet Take 17 g by mouth daily.    . pregabalin (LYRICA)  200 MG capsule Take 1 capsule (200 mg total) by mouth 3 (three) times daily. 270 capsule 1  . Probiotic Product (PROBIOTIC PO) Take 1 tablet by mouth at bedtime.    . senna (SENOKOT) 8.6 MG tablet Take 2 tablets by mouth 2 (two) times daily. Bedtime     . traMADol (ULTRAM) 50 MG tablet Take 2 tablets (100 mg total) by mouth 4 (four) times daily. 720 tablet 1   No current facility-administered medications for this encounter.     No Known Allergies  Social History   Socioeconomic History  . Marital status: Married    Spouse name: Not on file  . Number of children: Not on file  . Years of education:  Not on file  . Highest education level: Not on file  Social Needs  . Financial resource strain: Not on file  . Food insecurity - worry: Not on file  . Food insecurity - inability: Not on file  . Transportation needs - medical: Not on file  . Transportation needs - non-medical: Not on file  Occupational History  . Not on file  Tobacco Use  . Smoking status: Never Smoker  . Smokeless tobacco: Never Used  Substance and Sexual Activity  . Alcohol use: Yes    Alcohol/week: 0.6 oz    Types: 1 Cans of beer per week    Comment: occasionally maybe the weekends  . Drug use: No  . Sexual activity: Not on file  Other Topics Concern  . Not on file  Social History Narrative  . Not on file    Family History  Problem Relation Age of Onset  . Cancer Mother   . Heart failure Father     ROS- All systems are reviewed and negative except as per the HPI above  Physical Exam: Vitals:   04/26/17 1404  BP: (!) 142/82  Pulse: 65  Weight: 199 lb 12.8 oz (90.6 kg)  Height: 6' (1.829 m)   Wt Readings from Last 3 Encounters:  04/26/17 199 lb 12.8 oz (90.6 kg)  04/03/17 195 lb (88.5 kg)  03/29/17 190 lb (86.2 kg)    Labs: Lab Results  Component Value Date   NA 136 03/29/2017   K 3.8 03/29/2017   CL 104 03/29/2017   CO2 24 03/29/2017   GLUCOSE 115 (H) 03/29/2017   BUN 10 03/29/2017   CREATININE 0.85 03/29/2017   CALCIUM 8.3 (L) 03/29/2017   MG 2.4 (H) 01/26/2017   Lab Results  Component Value Date   INR 1.17 09/24/2014   Lab Results  Component Value Date   CHOL 108 06/22/2016   HDL 29 (L) 06/22/2016   LDLCALC 64 06/22/2016   TRIG 75 06/22/2016     GEN- The patient is well appearing, alert and oriented x 3 today.   Head- normocephalic, atraumatic Eyes-  Sclera clear, conjunctiva pink Ears- hearing intact Oropharynx- clear Neck- supple, no JVP Lymph- no cervical lymphadenopathy Lungs- Clear to ausculation bilaterally, normal work of breathing Heart- Regular rate and  rhythm, no murmurs, rubs or gallops, PMI not laterally displaced GI- soft, NT, ND, + BS Extremities- no clubbing, cyanosis, or edema MS- no significant deformity or atrophy Skin- no rash or lesion Psych- euthymic mood, full affect Neuro- strength and sensation are intact  EKG-NSR at 65 bpm, pr int 150 ms, qrs int 92 ms, qtc 413 ms Epic records reviewed    Assessment and Plan: 1. Paroxysmal atrial fibrillation S/p ablation, now in SR after having RVR and  contributing to a syncopal episode No further fast heart arrhythmia Wife asking to stop digoxin, started in ER 1/31, but will continue low dose BB Continue  eliquis 5 mg bid for chadsvasc score of at least 3  F/u with Dr. Rayann Heman May 15  Butch Penny C. Yvonna Brun, [facility] [address] [place], [place] [address] [phone]

## 2017-04-29 ENCOUNTER — Other Ambulatory Visit: Payer: Self-pay | Admitting: Cardiology

## 2017-05-04 ENCOUNTER — Telehealth (HOSPITAL_COMMUNITY): Payer: Self-pay | Admitting: *Deleted

## 2017-05-04 NOTE — Telephone Encounter (Signed)
Pts spouse called to report that pt has been pretty fatigued, and wanting to know if he could discontinue his metoprolol.  Pt BP today reported by wife was 125/70 and HR 58.  Per Roderic Palau, NP pt can discontinue the 12.5 mg dose of metoprolol he is currently taking but should patient develop any palpitations he should resume and call the afib clinic. She understood and would relay to patient.

## 2017-05-08 ENCOUNTER — Other Ambulatory Visit: Payer: Self-pay | Admitting: *Deleted

## 2017-05-08 MED ORDER — APIXABAN 5 MG PO TABS: 5.0000 mg | ORAL_TABLET | Freq: Two times a day (BID) | ORAL | 3 refills | Status: DC

## 2017-05-08 MED ORDER — ATORVASTATIN CALCIUM 40 MG PO TABS: ORAL_TABLET | ORAL | 2 refills | Status: DC

## 2017-05-08 NOTE — Telephone Encounter (Signed)
Eliquis 5mg  refill request received; pt is 62 yrs old, wt-88kg, Crea-0.85 on 03/29/17, seen by Roderic Palau on 04/26/17. Will send in refill to requested pharmacy.

## 2017-05-08 NOTE — Addendum Note (Signed)
Addended by: Juventino Slovak on: 05/08/2017 05:34 PM   Modules accepted: Orders

## 2017-05-27 ENCOUNTER — Other Ambulatory Visit: Payer: Self-pay | Admitting: Cardiology

## 2017-06-27 DIAGNOSIS — L237 Allergic contact dermatitis due to plants, except food: Secondary | ICD-10-CM | POA: Diagnosis not present

## 2017-06-28 ENCOUNTER — Ambulatory Visit: Payer: BLUE CROSS/BLUE SHIELD | Admitting: Physical Medicine & Rehabilitation

## 2017-06-29 ENCOUNTER — Telehealth: Payer: Self-pay

## 2017-06-29 NOTE — Telephone Encounter (Signed)
**Note De-Identified Char Feltman Obfuscation** We received a denial letter on a tier exception for Eliquis Lafreda Casebeer fax from [place]. Unsure who initiated this tier exception as there is no recent notes in the pts chart concerning this request.  Reason for denial: "We denied this request under Medicare Part D because: Your drug is a brand name medication. It is already on the lowest possible copay level for a brand drug. Therefore, we cannot cover your drug at a lower price".  I have notified the pts pharmacy and per the letter [facility] has also mailed the pt a denial letter as well.

## 2017-07-04 DIAGNOSIS — Q631 Lobulated, fused and horseshoe kidney: Secondary | ICD-10-CM | POA: Diagnosis not present

## 2017-07-04 DIAGNOSIS — R399 Unspecified symptoms and signs involving the genitourinary system: Secondary | ICD-10-CM | POA: Diagnosis not present

## 2017-07-04 DIAGNOSIS — N200 Calculus of kidney: Secondary | ICD-10-CM | POA: Diagnosis not present

## 2017-07-04 DIAGNOSIS — N281 Cyst of kidney, acquired: Secondary | ICD-10-CM | POA: Diagnosis not present

## 2017-07-06 ENCOUNTER — Ambulatory Visit (HOSPITAL_BASED_OUTPATIENT_CLINIC_OR_DEPARTMENT_OTHER): Payer: Medicare HMO | Admitting: Physical Medicine & Rehabilitation

## 2017-07-06 ENCOUNTER — Encounter: Payer: Self-pay | Admitting: Physical Medicine & Rehabilitation

## 2017-07-06 ENCOUNTER — Encounter: Payer: Medicare HMO | Attending: Physical Medicine & Rehabilitation

## 2017-07-06 VITALS — BP 103/71 | HR 72 | Ht 73.0 in | Wt 193.0 lb

## 2017-07-06 DIAGNOSIS — E785 Hyperlipidemia, unspecified: Secondary | ICD-10-CM | POA: Insufficient documentation

## 2017-07-06 DIAGNOSIS — I69351 Hemiplegia and hemiparesis following cerebral infarction affecting right dominant side: Secondary | ICD-10-CM | POA: Insufficient documentation

## 2017-07-06 DIAGNOSIS — Z79891 Long term (current) use of opiate analgesic: Secondary | ICD-10-CM

## 2017-07-06 DIAGNOSIS — I480 Paroxysmal atrial fibrillation: Secondary | ICD-10-CM | POA: Diagnosis not present

## 2017-07-06 DIAGNOSIS — I4892 Unspecified atrial flutter: Secondary | ICD-10-CM | POA: Diagnosis not present

## 2017-07-06 DIAGNOSIS — I6522 Occlusion and stenosis of left carotid artery: Secondary | ICD-10-CM | POA: Insufficient documentation

## 2017-07-06 DIAGNOSIS — Z5181 Encounter for therapeutic drug level monitoring: Secondary | ICD-10-CM

## 2017-07-06 DIAGNOSIS — I340 Nonrheumatic mitral (valve) insufficiency: Secondary | ICD-10-CM | POA: Diagnosis not present

## 2017-07-06 DIAGNOSIS — G894 Chronic pain syndrome: Secondary | ICD-10-CM | POA: Diagnosis not present

## 2017-07-06 DIAGNOSIS — I2510 Atherosclerotic heart disease of native coronary artery without angina pectoris: Secondary | ICD-10-CM | POA: Insufficient documentation

## 2017-07-06 DIAGNOSIS — G890 Central pain syndrome: Secondary | ICD-10-CM | POA: Diagnosis not present

## 2017-07-06 DIAGNOSIS — M79661 Pain in right lower leg: Secondary | ICD-10-CM | POA: Insufficient documentation

## 2017-07-06 MED ORDER — PREGABALIN 200 MG PO CAPS: 200.0000 mg | ORAL_CAPSULE | Freq: Three times a day (TID) | ORAL | 5 refills | Status: DC

## 2017-07-06 NOTE — Patient Instructions (Signed)
Go down the hall to [facility] for drug test   Please do not combine drinking alcohol with your pain meds (tramadol and Lyrica)

## 2017-07-06 NOTE — Progress Notes (Addendum)
Subjective:    Patient ID: [name], male    DOB: [birth year], 62 y.o.   MRN: [id number]  HPI 62 year old male with history of left posterior branch MCA infarct resulting in a mild right hemiparesis but severe hemisensory deficits with chronic neurogenic pain, Dejerine- Roussy syndrome.  Patient has trialed several medications for this including narcotic analgesics such as tapentadol however best combination thus far has been tramadol 100 mg 4 times daily plus Lyrica 200 mg 3 times daily.  Unfortunately he is in the donut hole and Lyrica is very expensive for him.  Patient is applying for [employer] assistance program. Interval medical history Patient has had ablation procedure for atrial fibrillation.  Denies falls.  Remains independent.  His speech remains severely aphasic Mainly receptive.  Also has a chronic apraxia Pain Inventory Average Pain 6 Pain Right Now 6 My pain is constant and dull  In the last 24 hours, has pain interfered with the following? General activity 7 Relation with others 2 Enjoyment of life 7 What TIME of day is your pain at its worst? morning, daytime, evening Sleep (in general) Good  Pain is worse with: unsure Pain improves with: heat/ice and medication Relief from Meds: 5  Mobility walk without assistance ability to climb steps?  yes  Function disabled: date disabled .  Neuro/Psych numbness  Prior Studies Any changes since last visit?  no  Physicians involved in your care Any changes since last visit?  no   Family History  Problem Relation Age of Onset  . Cancer Mother   . Heart failure Father    Social History   Socioeconomic History  . Marital status: Married    Spouse name: Not on file  . Number of children: Not on file  . Years of education: Not on file  . Highest education level: Not on file  Occupational History  . Not on file  Social Needs  . Financial resource strain: Not on file  . Food insecurity:    Worry: Not  on file    Inability: Not on file  . Transportation needs:    Medical: Not on file    Non-medical: Not on file  Tobacco Use  . Smoking status: Never Smoker  . Smokeless tobacco: Never Used  Substance and Sexual Activity  . Alcohol use: Yes    Alcohol/week: 0.6 oz    Types: 1 Cans of beer per week    Comment: occasionally maybe the weekends  . Drug use: No  . Sexual activity: Not on file  Lifestyle  . Physical activity:    Days per week: Not on file    Minutes per session: Not on file  . Stress: Not on file  Relationships  . Social connections:    Talks on phone: Not on file    Gets together: Not on file    Attends religious service: Not on file    Active member of club or organization: Not on file    Attends meetings of clubs or organizations: Not on file    Relationship status: Not on file  Other Topics Concern  . Not on file  Social History Narrative  . Not on file   Past Surgical History:  Procedure Laterality Date  . ATRIAL FIBRILLATION ABLATION N/A 03/27/2017   Procedure: ATRIAL FIBRILLATION ABLATION;  Surgeon: [name], MD;  Location: [facility] [facility];  Service: Cardiovascular;  Laterality: N/A;  . LITHOTRIPSY    . VASECTOMY     Past  Medical History:  Diagnosis Date  . Altered sensation due to stroke 12/10/2014  . 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 Basal cell carcinoma   . Bradycardia 10/21/2015  . Carotid artery stenosis    a. known occluded left ICA stenosis, s/p right CEA on 07/05/2016.  Marland Kitchen Contusion of calf, left, initial encounter 12/03/2015  . Coronary artery disease   . 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 Dilated aortic root ([place]) 10/20/2015  . 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 Hyperlipidemia LDL goal <70 05/31/2016  . ICAO (internal carotid artery occlusion), left 07/10/2016  . 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 Mild mitral regurgitation   . Nephrolithiasis     . Neuropathy   . PAF (paroxysmal atrial fibrillation) ([place]) 07/10/2016  . Typical atrial flutter ([facility])    BP 103/71   Pulse 72   Ht 6\' 1"  (1.854 m)   Wt 193 lb (87.5 kg)   SpO2 96%   BMI 25.46 kg/m   Opioid Risk Score:   Fall Risk Score:  `1  Depression screen PHQ 2/9  Depression screen [facility] 2/9 12/29/2016 06/04/2015 10/23/2014  Decreased Interest 0 0 0  Down, Depressed, Hopeless 0 0 0  PHQ - 2 Score 0 0 0  Altered sleeping - - 3  Tired, decreased energy - - 3  Change in appetite - - 0  Feeling bad or failure about yourself  - - 0  Trouble concentrating - - 0  Moving slowly or fidgety/restless - - 0  Suicidal thoughts - - 0  PHQ-9 Score - - 6  Difficult doing work/chores - - Not difficult at all    Review of Systems  Constitutional: Negative.   HENT: Negative.   Eyes: Negative.   Respiratory: Negative.   Gastrointestinal: Negative.   Endocrine: Negative.   Genitourinary: Negative.   Musculoskeletal: Positive for gait problem and myalgias.  Skin: Negative.   Allergic/Immunologic: Negative.   Neurological: Positive for numbness.  Psychiatric/Behavioral: Negative.   All other systems reviewed and are negative.      Objective:   Physical Exam  Constitutional: He appears well-developed and well-nourished. No distress.  HENT:  Head: Normocephalic and atraumatic.  Eyes: Pupils are equal, round, and reactive to light. EOM are normal.  Neurological: He is alert. He displays atrophy. He displays no tremor. He exhibits normal muscle tone.  Motor strength is 5- in the right deltoid bicep tricep grip hip flexor knee extensor ankle dorsiflexor 5/5 in the left deltoid bicep tricep grip hip flexor knee extensor ankle dorsiflexor There is no evidence of spasticity in the right upper or right lower limb There is mild atrophy of the right lower limb Sensation unable to distinguish light touch however difficult understanding exam instructions.  He has reduced sensation to pinch in  the right upper and right lower limb. There is no evidence of ataxia in the upper extremities. Gait is without evidence of toe drag or knee instability.  Skin: Skin is warm and dry. He is not diaphoretic.          Assessment & Plan:  1.  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  He has had previous work-up including lumbar MRI which demonstrated no evidence of spinal nerve root compression.  He has no progressive symptoms. Would continue the current pain medications listed below  Follow-up physical medicine rehab/NP 6 months  Indication for chronic opioid: See above Medication and dose: Tramadol 100 mg 4 times daily # pills per month: 240 Last UDS date: 07/06/2017 Opioid Treatment Agreement signed (Y/N): 5/10 /2019 Opioid Treatment Agreement last reviewed with patient:  07/06/2017 N[facility]SRS reviewed this encounter (include red flags):  07/06/2017

## 2017-07-11 ENCOUNTER — Ambulatory Visit: Payer: Medicare HMO | Admitting: Internal Medicine

## 2017-07-11 ENCOUNTER — Encounter: Payer: Self-pay | Admitting: Internal Medicine

## 2017-07-11 ENCOUNTER — Ambulatory Visit: Payer: Medicare HMO | Admitting: Neurology

## 2017-07-11 ENCOUNTER — Encounter: Payer: Self-pay | Admitting: Neurology

## 2017-07-11 ENCOUNTER — Encounter: Payer: Self-pay | Admitting: *Deleted

## 2017-07-11 VITALS — BP 110/62 | HR 74 | Ht 73.0 in | Wt 195.0 lb

## 2017-07-11 VITALS — BP 93/61 | HR 71 | Ht 73.0 in | Wt 194.0 lb

## 2017-07-11 DIAGNOSIS — I480 Paroxysmal atrial fibrillation: Secondary | ICD-10-CM

## 2017-07-11 DIAGNOSIS — I6522 Occlusion and stenosis of left carotid artery: Secondary | ICD-10-CM | POA: Diagnosis not present

## 2017-07-11 DIAGNOSIS — I63412 Cerebral infarction due to embolism of left middle cerebral artery: Secondary | ICD-10-CM | POA: Diagnosis not present

## 2017-07-11 DIAGNOSIS — I4892 Unspecified atrial flutter: Secondary | ICD-10-CM | POA: Diagnosis not present

## 2017-07-11 DIAGNOSIS — I77810 Thoracic aortic ectasia: Secondary | ICD-10-CM

## 2017-07-11 DIAGNOSIS — G890 Central pain syndrome: Secondary | ICD-10-CM

## 2017-07-11 DIAGNOSIS — I6521 Occlusion and stenosis of right carotid artery: Secondary | ICD-10-CM | POA: Diagnosis not present

## 2017-07-11 NOTE — Patient Instructions (Signed)
-   continue eliquis and ASA 81mg  and lipitor for stroke prevention - continue lyrica and tramadol for RLE pain - follow up with Dr. Redmond Pulling, cardiology and Dr. Letta Pate - Follow up with your primary care physician for stroke risk factor modification. Recommend maintain blood pressure goal 120-140/80, diabetes with hemoglobin A1c goal below 6.5% and lipids with LDL cholesterol goal below 70 mg/dL.  - check BP at home and goal is 110-140 due to left ICA occlusion.  - continue speech therapy and home exercise - follow up as needed

## 2017-07-11 NOTE — Patient Instructions (Addendum)
Medication Instructions:  Your physician recommends that you continue on your current medications as directed. Please refer to the Current Medication list given to you today.  Labwork: None ordered.  Testing/Procedures: None ordered.  Follow-Up: Your physician wants you to follow-up in: 4 months with Dr. Allred.      Any Other Special Instructions Will Be Listed Below (If Applicable).  If you need a refill on your cardiac medications before your next appointment, please call your pharmacy.   

## 2017-07-11 NOTE — Progress Notes (Signed)
PCP: Hulan Fess, MD Primary Cardiologist: Dr Colin Mulders [name] is a 62 y.o. male who presents today for routine electrophysiology followup.  Since his recent afib ablation, the patient reports doing very well.  he denies procedure related complications and is pleased with the results of the procedure.  Today, he denies symptoms of palpitations, chest pain, shortness of breath,  lower extremity edema, dizziness, presyncope, or syncope.  [name].   Past Medical History:  Diagnosis Date  . Altered sensation due to stroke 12/10/2014  . 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 Basal cell carcinoma   . Bradycardia 10/21/2015  . Carotid artery stenosis    a. known occluded left ICA stenosis, s/p right CEA on 07/05/2016.  Marland Kitchen Contusion of calf, left, initial encounter 12/03/2015  . Coronary artery disease   . 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 Dilated aortic root ([place]) 10/20/2015  . 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 Hyperlipidemia LDL goal <70 05/31/2016  . ICAO (internal carotid artery occlusion), left 07/10/2016  . 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 Mild mitral regurgitation   . Nephrolithiasis   . Neuropathy   . PAF (paroxysmal atrial fibrillation) ([place]) 07/10/2016  . Typical atrial flutter [facility])    Past Surgical History:  Procedure Laterality Date  . ATRIAL FIBRILLATION ABLATION N/A 03/27/2017   Procedure: ATRIAL FIBRILLATION ABLATION;  Surgeon: [name], MD;  Location: [place] [facility];  Service: Cardiovascular;  Laterality: N/A;  . LITHOTRIPSY    . VASECTOMY      ROS- all systems are personally reviewed and negatives except as per HPI above  Current Outpatient Medications  Medication Sig Dispense Refill  . acetaminophen (TYLENOL) 500 MG tablet Take 1,000 mg by mouth every 6 (six) hours as needed (for pain/headaches.).    Marland Kitchen aspirin EC 81 MG  tablet Take 81 mg by mouth daily.    Marland Kitchen atorvastatin (LIPITOR) 40 MG tablet TAKE 1 TABLET BY MOUTH DAILY AT 6 P.M. 90 tablet 2  . ELIQUIS 5 MG TABS tablet TAKE 1 TABLET TWICE A DAY 180 tablet 1  . fluticasone (FLONASE) 50 MCG/ACT nasal spray Place 1 spray into both nostrils at bedtime.     Marland Kitchen levothyroxine (SYNTHROID, LEVOTHROID) 25 MCG tablet Take 25 mcg by mouth daily before breakfast.     . polyethylene glycol (MIRALAX / GLYCOLAX) packet Take 17 g by mouth daily.    . pregabalin (LYRICA) 200 MG capsule Take 1 capsule (200 mg total) by mouth 3 (three) times daily. 90 capsule 5  . Probiotic Product (PROBIOTIC PO) Take 1 tablet by mouth at bedtime.    . senna (SENOKOT) 8.6 MG tablet Take 2 tablets by mouth 2 (two) times daily. Bedtime     . tamsulosin (FLOMAX) 0.4 MG CAPS capsule Take by mouth.    . traMADol (ULTRAM) 50 MG tablet Take 2 tablets (100 mg total) by mouth 4 (four) times daily. 720 tablet 1   No current facility-administered medications for this visit.     Physical Exam: Vitals:   07/11/17 1415  BP: 110/62  Pulse: 74  Weight: 195 lb (88.5 kg)  Height: 6\' 1"  (1.854 m)    GEN- The patient is well appearing, alert and oriented x 3 today.   Head- normocephalic, atraumatic Eyes-  Sclera clear, conjunctiva pink Ears- hearing intact Oropharynx- clear Lungs- Clear to ausculation  bilaterally, normal work of breathing Heart- Regular rate and rhythm, no murmurs, rubs or gallops, PMI not laterally displaced GI- soft, NT, ND, + BS Extremities- no clubbing, cyanosis, or edema  EKG tracing ordered today is personally reviewed and shows sinus rhythm 74 bpm, PR 144 msec, QRS 94 msec, Qtc 424 msec, otherwise normal ekg  Assessment and Plan:  1. paroxysmal atrial fibrillation/ atrial flutter Doing well s/p ablation off AAD therapy chads2vasc score is 3.  Continue long term anticoagulation  2. Aortic enlargement Mild Aortic root is 3.8cm Dr Radford Pax to follow  Return to see me in 4  months  [name] MD, [facility] 07/11/2017 2:40 PM

## 2017-07-11 NOTE — Progress Notes (Signed)
STROKE NEUROLOGY FOLLOW UP NOTE  NAME: [name] DOB: 16-May-1955  REASON FOR VISIT: stroke follow up HISTORY FROM: pt and wife and chart  Today we had the pleasure of seeing [name] at our Neurology Clinic. Pt was accompanied by wife.   History Summary [name] is a 62 y.o. male with history of atrial fibrillation not on anticoagulation, basal cell carcinoma, and nephrolithiasis was admitted on 09/28/14 for prolonged confusion with difficulty communicating and following commands. 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 MRA showed left ICA occlusion, and CTA showed left ICA occlusion with right ICA 65% stenosis. Carotid Doppler showed right ICA 40-59% stenosis and left ICA occlusion. Echo unremarkable. LDL 102 and A1c 5.9. He was started on eliquis 5 mg bid and Lipitor 40 for stroke prevention. In terms of left ICA occlusion and right ICA 65% stenosis, vascular surgery was consulted, however, no intervention offered due to right ICA and less than 80% stenosis. He was discharged to home with outpatient speech therapy.  12/18/14 follow up - the patient has been doing well. Speech improved although still has partial global aphasia. Blood pressure in clinic 101/66 he is on Lopressor for rate control. I'll increase and Lipitor without side effects. Had MRI lumbar spine due to right lower extremity pain, which was unremarkable. No other complaints.  03/12/15 follow up - the patient had improvement in his speech. Had second opinion from Dr. Redmond Pulling in [facility] and still recommend medical treatment for the right ICA stenosis, no intervention recommended. He will see Dr. Redmond Pulling again next month for CUS monitoring. He still has right sided neuropathic pain and stiffness, has been following with Dr. Letta Pate and now on lyrica 300mg  bid, tramadol 100mg  tid and zanaflex PRN. However, pt stated that they are not helping. BP today 93/65. He had left eye blurry  vision after exposure to bright light, lasting 1-2 hours.   07/01/15 follow up - pt has been doing the same. Continues to have right leg numbness, painful feeling, tightness. Right arm and face much improved. He did not tolerate trileptal due to drowsiness and was stopped by himself. Continued on tramadol and lyrica and zanaflex. Able to walk well. Speech about the same, occasional hesitancy. BP still at low side, 98/59. Asymptomatic. On metoprolol 12.5mg  daily only. Recently has poison ivy allergy, on 12 day dose of steroids.  12/31/15 follow up - pt has been doing better. He continues to have right leg painful feeling but not able to tolerate cymbalta, currently on lyrica and tramadol. Speech seems improved and with more words out. BP better 112/79 after metoprolol discontinued. He recently has intermittent flank pain on the right and considered kidney stone. Is going to have CT done next Friday. Today he also complains of right dorsal hand itching and swollen. Denies any allergy and insect bites. BP at home 110-130 most of the time.   07/10/16 follow up - pt has been doing well, no recurrent stroke. He had kidney stone surgery in 01/2016. Repeat CUS in 03/2016 showed chronic left ICA occlusion and right ICA 75-99% stenosis. Dr. Redmond Pulling offered CREST II trial but pt declined. He got right CEA 07/05/16 with Dr. Redmond Pulling. So far he feels some soreness at right incision site but otherwise doing well. Felt speech is continue improving. BP stable at 110/67.   Interval History During the interval time, pt has been doing well. No recurrent symptoms. Followed with Dr. Redmond Pulling in [facility]. Last week  followed with Dr. Letta Pate for lyrica and tramadol for RLE pain. Will see cardiology this afternoon for afib follow up. Continue to have home speech therapy. Followed with urology and found out he has new kidney stones developing but no need surgery at this time. However, his BP today was low at 93/61. At home, wife said his BP  110-115. Asymptomatic.  REVIEW OF SYSTEMS: Full 14 system review of systems performed and notable only for those listed below and in HPI above, all others are negative:  Constitutional:   Cardiovascular:  Ear/Nose/Throat:  Ringing in ears Skin:  Eyes:   Respiratory:   Gastroitestinal:   Genitourinary:  Hematology/Lymphatic:   Endocrine: cold intolerance Musculoskeletal:   Allergy/Immunology:   Neurological:  Numbness, speech difficulty Psychiatric:  Sleep:   The following represents the patient's updated allergies and side effects list: No Known Allergies  The neurologically relevant items on the patient's problem list were reviewed on today's visit.  Neurologic Examination  A problem focused neurological exam (12 or more points of the single system neurologic examination, vital signs counts as 1 point, cranial nerves count for 8 points) was performed.  Blood pressure 93/61, pulse 71, height 6\' 1"  (1.854 m), weight 194 lb (88 kg).  General - Well nourished, well developed, in no apparent distress.  Ophthalmologic -  Fundi not visualized due to noncooperation.  Cardiovascular - irregularly irregular heart rate and rhythm.  Mental Status -  Level of arousal and orientation to time, place, and person were intact. Language exam showed occasional speech hesitancy and paraphasic errors, able to follow some simple commands, but not complex commands. Able to name and repeat.   Cranial Nerves II - XII - II - Visual field intact OU. III, IV, VI - Extraocular movements intact. V - right facial sensation decreased. VII - right nasolabial fold mild flattening. VIII - Hearing & vestibular intact bilaterally X - Palate elevates symmetrically. XI - Chin turning & shoulder shrug intact bilaterally. XII - Tongue protrusion intact.  Motor Strength - The patient's strength was normal in all extremities and pronator drift was absent.  Bulk was normal and fasciculations were absent.     Motor Tone - Muscle tone was assessed at the neck and appendages and was normal.  Reflexes - The patient's reflexes were 1+ in all extremities and he had no pathological reflexes.  Sensory - Light touch, temperature/pinprick were assessed and were decreased on the right.    Coordination - The patient had normal movements in the hands and feet with no ataxia or dysmetria.  Tremor was absent.  Gait and Station - The patient's transfers, posture, gait, station, and turns were observed as normal.  Data reviewed: I personally reviewed the images and agree with the radiology interpretations.  Dg Chest 2 View  09/25/2014  Mild right basilar atelectasis. Interstitial thickening, this appears chronic.   Ct Head Wo Contrast 09/24/2014  Acute nonhemorrhagic infarction in the posterior left temporal lobe and left parietal lobe. Mild mass effect with sulcal effacement but no midline shift.   Ct Angio Neck W/cm &/or Wo/cm 09/25/2014  1. 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  2. The right internal carotid artery is narrowed to 1.9 mm. This represents the 65% stenosis relative to the more distal vessel at 5.5 mm.  3. The vertebral arteries are intact bilaterally.  4. Degenerative changes of the cervical spine are most evident at C5-6 with mild osseous foraminal narrowing bilaterally.   Mr Brain [name]  Contrast 09/25/2014   MRI HEAD:  Acute large LEFT middle cerebral artery territory infarct without hemorrhagic conversion. Tubular susceptibility artifact LEFT cerebrum most compatible with thromboembolism distal LEFT MCA segment. Mild white matter changes compatible with chronic small vessel ischemic disease.   MRA HEAD:  LEFT internal carotid artery occlusion, recommend angiographic imaging in neck for further assessment. Slow flow within LEFT middle cerebral artery, retrograde filling via LEFT PCOM. Complete circle of Willis. Mild LEFT MCA luminal  irregularity which suggests atherosclerosis.   CUS - Right: 40-59% ICA stenosis. Left: ICA is occluded. Bilateral: Vertebral artery flow is antegrade.   2D echo - - Left ventricle: The cavity size was normal. There was mild concentric hypertrophy. Systolic function was normal. The estimated ejection fraction was in the range of 60% to 65%. Wall motion was normal; there were no regional wall motion abnormalities. Left ventricular diastolic function parameters were normal. Lateral annulus E velocity: 11.4 cm/s. Medial annulus E velocity: 9.68 cm/s. - Aorta: Aortic root at upper normal limits of size. Aortic root dimension: 39 mm (ED). - Mitral valve: There was mild regurgitation. - Systemic veins: IVC dilated with normal respiratory variation. Estimated CVP 8 mmHg.  MRI lumbar spine - No significant lumbar spine disc protrusion, foraminal stenosis or central canal stenosis.  CUS 04/18/16 - This is an abnormal carotid duplex exam demonstrating the disease described  below. There is probable occlusion of the left ICA. Increased velocity also  suggests 75-99% stenosis in the right ICA with plaque as described. CCA  volume flow is normal on ther right, and diminshed on the left. Given the  diease on the left, and normal volume flow, there may be an element of  hyperemia accounting for a portion of the increased velocity on the right.  However, still believe it qualifies in this stenotic category in spite of  that. A prior study dated 04/08/15 was reviewed. Velocity is higher in the  right ICA, with unchanged apparent occlusion on the left. CCA volume flow is  marginally higher bilaterally, still with side difference, lower on the left.  Component     Latest Ref Rng 09/25/2014  Cholesterol     0 - 200 mg/dL 156  Triglycerides     <150 mg/dL 117  HDL Cholesterol     >40 mg/dL 31 (L)  Total CHOL/HDL Ratio      5.0  VLDL     0 - 40 mg/dL 23  LDL (calc)     0 - 99  mg/dL 102 (H)  Hemoglobin A1C     4.8 - 5.6 % 5.9 (H)  Mean Plasma Glucose      123    Assessment: As you may recall, he is a 62 y.o. Caucasian male with PMH of atrial fibrillation not on anticoagulation, basal cell carcinoma, and nephrolithiasis was admitted on 09/28/14 for large left MCA territory infarct. CTA showed left ICA occlusion with right ICA 65% stenosis. Carotid Doppler showed right ICA 40-59% stenosis and left ICA occlusion. LDL 102 and A1c 5.9. He was started on eliquis 5 mg bid and Lipitor 40 for stroke prevention. In terms of left ICA occlusion and right ICA 65% stenosis, vascular surgery was consulted, however, no intervention offered due to right ICA and less than 80% stenosis. Second opinion from Dr. Redmond Pulling at [facility] also recommend conservative measure for the right ICA stenosis. During the interval time, his speech much improved. 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 Letta Pate, on  lyrica and tramadol. now off zanaflex. Not tolerating trileptal or cymbalta. BP better and not on BP meds. Had kidney stone surgery 01/2016. Repeat CUS 03/2016 showed right ICA 75-99% stenosis. Had right CEA 07/05/16 with Dr. Redmond Pulling, put on ASA on top of eliquis. Doing well so far, BP low today but asymptomatic. At home BP 110-115.   Plan:  - continue eliquis and ASA 81mg  and lipitor for stroke prevention - continue lyrica and tramadol for RLE pain - follow up with Dr. Redmond Pulling, cardiology and Dr. Letta Pate - Follow up with your primary care physician for stroke risk factor modification. Recommend maintain blood pressure goal 120-140/80, diabetes with hemoglobin A1c goal below 6.5% and lipids with LDL cholesterol goal below 70 mg/dL.  - check BP at home and goal is 110-140 due to left ICA occlusion.  - continue speech therapy and home exercise - follow up as needed   No orders of the defined types were placed in this encounter.   No orders of the defined types  were placed in this encounter.   Patient Instructions  - continue eliquis and ASA 81mg  and lipitor for stroke prevention - continue lyrica and tramadol for RLE pain - follow up with Dr. Redmond Pulling, cardiology and Dr. Letta Pate - Follow up with your primary care physician for stroke risk factor modification. Recommend maintain blood pressure goal 120-140/80, diabetes with hemoglobin A1c goal below 6.5% and lipids with LDL cholesterol goal below 70 mg/dL.  - check BP at home and goal is 110-140 due to left ICA occlusion.  - continue speech therapy and home exercise - follow up as needed    Rosalin Hawking, MD PhD [facility] [address], [place], [place] [address] [phone]

## 2017-07-16 ENCOUNTER — Telehealth: Payer: Self-pay | Admitting: *Deleted

## 2017-07-16 LAB — TOXASSURE SELECT,+ANTIDEPR,UR

## 2017-07-16 NOTE — Telephone Encounter (Signed)
Urine drug screen for this encounter is consistent for prescribed medication.  ETG is present but not ETS.  He does not have a diagnosis of diabetes, but did have an elevated glucose reading with his last cmp.

## 2017-07-17 NOTE — Telephone Encounter (Signed)
Okay I do not think we need to follow-up on this

## 2017-07-25 DIAGNOSIS — R69 Illness, unspecified: Secondary | ICD-10-CM | POA: Diagnosis not present

## 2017-08-15 ENCOUNTER — Other Ambulatory Visit: Payer: Self-pay

## 2017-08-15 MED ORDER — PREGABALIN 200 MG PO CAPS: 200.0000 mg | ORAL_CAPSULE | Freq: Three times a day (TID) | ORAL | 5 refills | Status: DC

## 2017-10-04 ENCOUNTER — Other Ambulatory Visit: Payer: Self-pay

## 2017-10-04 ENCOUNTER — Other Ambulatory Visit: Payer: Self-pay | Admitting: Physical Medicine & Rehabilitation

## 2017-10-04 MED ORDER — APIXABAN 5 MG PO TABS: 5.0000 mg | ORAL_TABLET | Freq: Two times a day (BID) | ORAL | 1 refills | Status: DC

## 2017-10-04 NOTE — Telephone Encounter (Signed)
Refilled for 3 months, next appt is 01/04/18.

## 2017-10-04 NOTE — Telephone Encounter (Signed)
Pt is a 62 yr old male who saw Dr Rayann Heman on 07/11/17. Weight at that visit was 88.5Kg. Last noted SCr was 0.85 on 03/29/17. Will refill Eliquis 5mg  BID.

## 2017-10-18 DIAGNOSIS — I10 Essential (primary) hypertension: Secondary | ICD-10-CM | POA: Diagnosis not present

## 2017-10-18 DIAGNOSIS — I6521 Occlusion and stenosis of right carotid artery: Secondary | ICD-10-CM | POA: Diagnosis not present

## 2017-10-18 DIAGNOSIS — Z8673 Personal history of transient ischemic attack (TIA), and cerebral infarction without residual deficits: Secondary | ICD-10-CM | POA: Diagnosis not present

## 2017-10-18 DIAGNOSIS — I69320 Aphasia following cerebral infarction: Secondary | ICD-10-CM | POA: Diagnosis not present

## 2017-10-18 DIAGNOSIS — I69398 Other sequelae of cerebral infarction: Secondary | ICD-10-CM | POA: Diagnosis not present

## 2017-10-18 DIAGNOSIS — I6523 Occlusion and stenosis of bilateral carotid arteries: Secondary | ICD-10-CM | POA: Diagnosis not present

## 2017-10-18 DIAGNOSIS — Z9889 Other specified postprocedural states: Secondary | ICD-10-CM | POA: Diagnosis not present

## 2017-10-19 DIAGNOSIS — Z87442 Personal history of urinary calculi: Secondary | ICD-10-CM | POA: Diagnosis not present

## 2017-10-19 DIAGNOSIS — R3989 Other symptoms and signs involving the genitourinary system: Secondary | ICD-10-CM | POA: Diagnosis not present

## 2017-10-19 DIAGNOSIS — R1031 Right lower quadrant pain: Secondary | ICD-10-CM | POA: Diagnosis not present

## 2017-11-08 DIAGNOSIS — D1801 Hemangioma of skin and subcutaneous tissue: Secondary | ICD-10-CM | POA: Diagnosis not present

## 2017-11-08 DIAGNOSIS — D2271 Melanocytic nevi of right lower limb, including hip: Secondary | ICD-10-CM | POA: Diagnosis not present

## 2017-11-08 DIAGNOSIS — Z85828 Personal history of other malignant neoplasm of skin: Secondary | ICD-10-CM | POA: Diagnosis not present

## 2017-11-08 DIAGNOSIS — L821 Other seborrheic keratosis: Secondary | ICD-10-CM | POA: Diagnosis not present

## 2017-11-15 ENCOUNTER — Ambulatory Visit: Payer: Medicare HMO | Admitting: Internal Medicine

## 2017-11-15 ENCOUNTER — Encounter: Payer: Self-pay | Admitting: Internal Medicine

## 2017-11-15 VITALS — BP 108/72 | HR 65 | Ht 73.0 in | Wt 197.9 lb

## 2017-11-15 DIAGNOSIS — I77810 Thoracic aortic ectasia: Secondary | ICD-10-CM

## 2017-11-15 DIAGNOSIS — I480 Paroxysmal atrial fibrillation: Secondary | ICD-10-CM | POA: Diagnosis not present

## 2017-11-15 DIAGNOSIS — I4892 Unspecified atrial flutter: Secondary | ICD-10-CM

## 2017-11-15 NOTE — Patient Instructions (Addendum)
Medication Instructions:  Your physician recommends that you continue on your current medications as directed. Please refer to the Current Medication list given to you today.  Labwork: None ordered.  Testing/Procedures: None ordered.  Follow-Up:  Your physician wants you to follow-up in: 6 months with Dr. Radford Pax.   You will receive a reminder letter in the mail two months in advance. If you don't receive a letter, please call our office to schedule the follow-up appointment.  Your physician wants you to follow-up in: one year with Dr. Rayann Heman.   You will receive a reminder letter in the mail two months in advance. If you don't receive a letter, please call our office to schedule the follow-up appointment.  Any Other Special Instructions Will Be Listed Below (If Applicable).  If you need a refill on your cardiac medications before your next appointment, please call your pharmacy.

## 2017-11-15 NOTE — Progress Notes (Signed)
PCP: Hulan Fess, MD Primary Cardiologist: Dr Radford Pax Primary EP: Dr Rayann Heman  [name] is a 62 y.o. male who presents today for routine electrophysiology followup.  Since last being seen in our clinic, the patient reports doing very well.  Today, he denies symptoms of palpitations, chest pain, shortness of breath,  lower extremity edema, dizziness, presyncope, or syncope.  [name].   Past Medical History:  Diagnosis Date  . Altered sensation due to stroke 12/10/2014  . 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 Basal cell carcinoma   . Bradycardia 10/21/2015  . Carotid artery stenosis    a. known occluded left ICA stenosis, s/p right CEA on 07/05/2016.  Marland Kitchen Contusion of calf, left, initial encounter 12/03/2015  . Coronary artery disease   . 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 Dilated aortic root ([place]) 10/20/2015  . 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 Hyperlipidemia LDL goal <70 05/31/2016  . ICAO (internal carotid artery occlusion), left 07/10/2016  . Left middle cerebral artery stroke ([place]) 09/29/2014  . Mild mitral regurgitation   . Nephrolithiasis   . Neuropathy   . PAF (paroxysmal atrial fibrillation) ([place]) 07/10/2016  . Typical atrial flutter [facility])    Past Surgical History:  Procedure Laterality Date  . ATRIAL FIBRILLATION ABLATION N/A 03/27/2017   Procedure: ATRIAL FIBRILLATION ABLATION;  Surgeon: [name], MD;  Location: [facility];  Service: Cardiovascular;  Laterality: N/A;  . LITHOTRIPSY    . VASECTOMY      ROS- all systems are reviewed and negatives except as per HPI above  Current Outpatient Medications  Medication Sig Dispense Refill  . acetaminophen (TYLENOL) 500 MG tablet Take 1,000 mg by mouth every 6 (six) hours as needed (for pain/headaches.).    Marland Kitchen apixaban (ELIQUIS) 5 MG TABS tablet Take 1 tablet (5 mg total) by mouth 2 (two) times daily.  180 tablet 1  . aspirin EC 81 MG tablet Take 81 mg by mouth daily.    Marland Kitchen atorvastatin (LIPITOR) 40 MG tablet TAKE 1 TABLET BY MOUTH DAILY AT 6 P.M. 90 tablet 2  . fluticasone (FLONASE) 50 MCG/ACT nasal spray Place 1 spray into both nostrils at bedtime.     Marland Kitchen levothyroxine (SYNTHROID, LEVOTHROID) 25 MCG tablet Take 25 mcg by mouth daily before breakfast.     . polyethylene glycol (MIRALAX / GLYCOLAX) packet Take 17 g by mouth daily.    . pregabalin (LYRICA) 200 MG capsule Take 1 capsule (200 mg total) by mouth 3 (three) times daily. 90 capsule 5  . Probiotic Product (PROBIOTIC PO) Take 1 tablet by mouth at bedtime.    . senna (SENOKOT) 8.6 MG tablet Take 2 tablets by mouth 2 (two) times daily. Bedtime     . traMADol (ULTRAM) 50 MG tablet TAKE 2 TABLETS 4 TIMES     DAILY 720 tablet 0   No current facility-administered medications for this visit.     Physical Exam: Vitals:   11/15/17 1110  BP: 108/72  Pulse: 65  SpO2: 98%  Weight: 197 lb 14.4 oz (89.8 kg)  Height: 6\' 1"  (1.854 m)    GEN- The patient is well appearing, alert and oriented x 3 today.   Head- normocephalic, atraumatic Eyes-  Sclera clear, conjunctiva pink Ears- hearing intact Oropharynx- clear Lungs- Clear to ausculation bilaterally, normal work of breathing Heart- Regular rate and rhythm, no murmurs, rubs  or gallops, PMI not laterally displaced GI- soft, NT, ND, + BS Extremities- no clubbing, cyanosis, or edema  Wt Readings from Last 3 Encounters:  11/15/17 197 lb 14.4 oz (89.8 kg)  07/11/17 195 lb (88.5 kg)  07/11/17 194 lb (88 kg)    EKG tracing ordered today is personally reviewed and shows sinus rhythm  Assessment and Plan:  1. Paroxysmal atrial fibrillation/ atrial flutter Doing well post ablation off AAD therapy chads2vasc score is 3.  Continue anticoagulation  2. Aortic enlargement Mild Followed by Dr Radford Pax  I will see in a year Follow-up with Dr Radford Pax in 6 months  [name] MD,  [facility] 11/15/2017 11:14 AM

## 2017-11-22 DIAGNOSIS — Z0000 Encounter for general adult medical examination without abnormal findings: Secondary | ICD-10-CM | POA: Diagnosis not present

## 2017-11-22 DIAGNOSIS — Z87442 Personal history of urinary calculi: Secondary | ICD-10-CM | POA: Diagnosis not present

## 2017-11-22 DIAGNOSIS — Z8673 Personal history of transient ischemic attack (TIA), and cerebral infarction without residual deficits: Secondary | ICD-10-CM | POA: Diagnosis not present

## 2017-11-22 DIAGNOSIS — R7301 Impaired fasting glucose: Secondary | ICD-10-CM | POA: Diagnosis not present

## 2017-11-22 DIAGNOSIS — H7290 Unspecified perforation of tympanic membrane, unspecified ear: Secondary | ICD-10-CM | POA: Diagnosis not present

## 2017-11-22 DIAGNOSIS — E039 Hypothyroidism, unspecified: Secondary | ICD-10-CM | POA: Diagnosis not present

## 2017-11-22 DIAGNOSIS — I779 Disorder of arteries and arterioles, unspecified: Secondary | ICD-10-CM | POA: Diagnosis not present

## 2017-11-22 DIAGNOSIS — Z23 Encounter for immunization: Secondary | ICD-10-CM | POA: Diagnosis not present

## 2017-11-22 DIAGNOSIS — E786 Lipoprotein deficiency: Secondary | ICD-10-CM | POA: Diagnosis not present

## 2017-11-22 DIAGNOSIS — I4891 Unspecified atrial fibrillation: Secondary | ICD-10-CM | POA: Diagnosis not present

## 2018-01-04 ENCOUNTER — Encounter: Payer: Self-pay | Admitting: Registered Nurse

## 2018-01-04 ENCOUNTER — Encounter: Payer: Medicare HMO | Attending: Registered Nurse | Admitting: Registered Nurse

## 2018-01-04 VITALS — BP 106/61 | HR 70 | Resp 14 | Ht 73.5 in | Wt 195.0 lb

## 2018-01-04 DIAGNOSIS — G890 Central pain syndrome: Secondary | ICD-10-CM

## 2018-01-04 DIAGNOSIS — Z5181 Encounter for therapeutic drug level monitoring: Secondary | ICD-10-CM | POA: Diagnosis not present

## 2018-01-04 DIAGNOSIS — G8929 Other chronic pain: Secondary | ICD-10-CM | POA: Diagnosis present

## 2018-01-04 DIAGNOSIS — M79604 Pain in right leg: Secondary | ICD-10-CM | POA: Diagnosis not present

## 2018-01-04 DIAGNOSIS — Z79891 Long term (current) use of opiate analgesic: Secondary | ICD-10-CM | POA: Diagnosis not present

## 2018-01-04 DIAGNOSIS — Z760 Encounter for issue of repeat prescription: Secondary | ICD-10-CM | POA: Diagnosis not present

## 2018-01-04 DIAGNOSIS — G894 Chronic pain syndrome: Secondary | ICD-10-CM

## 2018-01-04 DIAGNOSIS — G629 Polyneuropathy, unspecified: Secondary | ICD-10-CM | POA: Insufficient documentation

## 2018-01-04 MED ORDER — TRAMADOL HCL 50 MG PO TABS: ORAL_TABLET | ORAL | 1 refills | Status: DC

## 2018-01-04 MED ORDER — PREGABALIN 200 MG PO CAPS: 200.0000 mg | ORAL_CAPSULE | Freq: Three times a day (TID) | ORAL | 2 refills | Status: DC

## 2018-01-04 NOTE — Progress Notes (Signed)
Subjective:    Patient ID: [name], male    DOB: [birth year], 62 y.o.   MRN: [id number]  HPI: [name] is a 62 year old male who returns for follow up appointment for chronic pain and medication refill. He states his pain is located in right lower extremity. He rates his pain 5. His current exercise regime is walking and performing stretching exercises.   [name] Morphine Equivalent is 40.00  MME. Last UDS was Performed on 07/06/17, it was consistent.   Wife in room all questions answered.   Pain Inventory Average Pain 7 Pain Right Now 5 My pain is constant and burning  In the last 24 hours, has pain interfered with the following? General activity 5 Relation with others 3 Enjoyment of life 5 What TIME of day is your pain at its worst? daytime Sleep (in general) Good  Pain is worse with: walking Pain improves with: heat/ice and medication Relief from Meds: 5  Mobility walk without assistance ability to climb steps?  yes do you drive?  no  Function disabled: date disabled .  Neuro/Psych numbness  Prior Studies Any changes since last visit?  no  Physicians involved in your care Any changes since last visit?  no   Family History  Problem Relation Age of Onset  . Cancer Mother   . Heart failure Father    Social History   Socioeconomic History  . Marital status: Married    Spouse name: Not on file  . Number of children: Not on file  . Years of education: Not on file  . Highest education level: Not on file  Occupational History  . Not on file  Social Needs  . Financial resource strain: Not on file  . Food insecurity:    Worry: Not on file    Inability: Not on file  . Transportation needs:    Medical: Not on file    Non-medical: Not on file  Tobacco Use  . Smoking status: Never Smoker  . Smokeless tobacco: Never Used  Substance and Sexual Activity  . Alcohol use: Yes    Alcohol/week: 1.0 standard drinks    Types: 1 Cans  of beer per week    Comment: occasionally maybe the weekends  . Drug use: No  . Sexual activity: Not on file  Lifestyle  . Physical activity:    Days per week: Not on file    Minutes per session: Not on file  . Stress: Not on file  Relationships  . Social connections:    Talks on phone: Not on file    Gets together: Not on file    Attends religious service: Not on file    Active member of club or organization: Not on file    Attends meetings of clubs or organizations: Not on file    Relationship status: Not on file  Other Topics Concern  . Not on file  Social History Narrative  . Not on file   Past Surgical History:  Procedure Laterality Date  . ATRIAL FIBRILLATION ABLATION N/A 03/27/2017   Procedure: ATRIAL FIBRILLATION ABLATION;  Surgeon: [name], MD;  Location: [facility];  Service: Cardiovascular;  Laterality: N/A;  . LITHOTRIPSY    . VASECTOMY     Past Medical History:  Diagnosis Date  . Altered sensation due to stroke 12/10/2014  . 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 Basal cell carcinoma   . Bradycardia 10/21/2015  . Carotid artery stenosis  a. known occluded left ICA stenosis, s/p right CEA on 07/05/2016.  Marland Kitchen Contusion of calf, left, initial encounter 12/03/2015  . Coronary artery disease   . 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 Dilated aortic root ([place]) 10/20/2015  . 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 Hyperlipidemia LDL goal <70 05/31/2016  . ICAO (internal carotid artery occlusion), left 07/10/2016  . Left middle cerebral artery stroke ([place]) 09/29/2014  . Mild mitral regurgitation   . Nephrolithiasis   . Neuropathy   . PAF (paroxysmal atrial fibrillation) ([place]) 07/10/2016  . Typical atrial flutter ([facility])    BP 106/61   Pulse 70   Resp 14   Ht 6' 1.5" (1.867 m)   Wt 195 lb (88.5 kg)   SpO2 96%   BMI 25.38 kg/m   Opioid Risk Score:   Fall Risk Score:  `1  Depression screen  PHQ 2/9  Depression screen [facility] 2/9 12/29/2016 06/04/2015 10/23/2014  Decreased Interest 0 0 0  Down, Depressed, Hopeless 0 0 0  PHQ - 2 Score 0 0 0  Altered sleeping - - 3  Tired, decreased energy - - 3  Change in appetite - - 0  Feeling bad or failure about yourself  - - 0  Trouble concentrating - - 0  Moving slowly or fidgety/restless - - 0  Suicidal thoughts - - 0  PHQ-9 Score - - 6  Difficult doing work/chores - - Not difficult at all    Review of Systems  Constitutional: Negative.   HENT: Negative.   Eyes: Negative.   Respiratory: Negative.   Cardiovascular: Negative.   Gastrointestinal: Negative.   Endocrine: Negative.   Genitourinary: Negative.   Musculoskeletal: Negative.   Skin: Negative.   Allergic/Immunologic: Negative.   Neurological: Positive for numbness.  Hematological: Negative.   Psychiatric/Behavioral: Negative.   All other systems reviewed and are negative.      Objective:   Physical Exam  Constitutional: He is oriented to person, place, and time. He appears well-developed and well-nourished.  HENT:  Head: Normocephalic and atraumatic.  Neck: Normal range of motion.  Cardiovascular: Normal rate and regular rhythm.  Pulmonary/Chest: Effort normal and breath sounds normal.  Musculoskeletal:  Normal Muscle Bulk and Muscle Testing Reveals:  Upper Extremities: Full ROM and Muscle Strength 5/5 Lower Extremities: Full ROM and Muscle Strength 5/5 Arises from Table with ease Narrow Based Gait   Neurological: He is alert and oriented to person, place, and time.  Skin: Skin is warm and dry.  Psychiatric: He has a normal mood and affect. His behavior is normal.  Nursing note and vitals reviewed.         Assessment & Plan:  1. 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 #720 90 day supply.  2. Chronic Pain: Continue Tramadol. Continue to Monitor.  We will continue the  opioid monitoring program, this consists of regular clinic visits, examinations, urine drug screen, pill counts as well as use of [place] Controlled Substance Reporting system.  30 Minutes minutes of face to face patient care time was spent during this visit. All questions were encouraged and answered.  F/U in 6 months

## 2018-02-06 DIAGNOSIS — I69320 Aphasia following cerebral infarction: Secondary | ICD-10-CM | POA: Diagnosis not present

## 2018-02-06 DIAGNOSIS — I97821 Postprocedural cerebrovascular infarction during other surgery: Secondary | ICD-10-CM | POA: Diagnosis not present

## 2018-02-11 ENCOUNTER — Other Ambulatory Visit: Payer: Self-pay | Admitting: Cardiology

## 2018-02-28 DIAGNOSIS — I97821 Postprocedural cerebrovascular infarction during other surgery: Secondary | ICD-10-CM | POA: Diagnosis not present

## 2018-02-28 DIAGNOSIS — I69320 Aphasia following cerebral infarction: Secondary | ICD-10-CM | POA: Diagnosis not present

## 2018-03-06 DIAGNOSIS — I69320 Aphasia following cerebral infarction: Secondary | ICD-10-CM | POA: Diagnosis not present

## 2018-03-06 DIAGNOSIS — I97821 Postprocedural cerebrovascular infarction during other surgery: Secondary | ICD-10-CM | POA: Diagnosis not present

## 2018-03-07 ENCOUNTER — Other Ambulatory Visit: Payer: Self-pay

## 2018-03-07 ENCOUNTER — Other Ambulatory Visit: Payer: Self-pay | Admitting: Cardiology

## 2018-03-07 MED ORDER — ATORVASTATIN CALCIUM 40 MG PO TABS: ORAL_TABLET | ORAL | 0 refills | Status: DC

## 2018-03-14 DIAGNOSIS — R69 Illness, unspecified: Secondary | ICD-10-CM | POA: Diagnosis not present

## 2018-04-16 DIAGNOSIS — H53461 Homonymous bilateral field defects, right side: Secondary | ICD-10-CM | POA: Diagnosis not present

## 2018-04-16 DIAGNOSIS — H2513 Age-related nuclear cataract, bilateral: Secondary | ICD-10-CM | POA: Diagnosis not present

## 2018-04-16 DIAGNOSIS — H40013 Open angle with borderline findings, low risk, bilateral: Secondary | ICD-10-CM | POA: Diagnosis not present

## 2018-04-16 DIAGNOSIS — H25013 Cortical age-related cataract, bilateral: Secondary | ICD-10-CM | POA: Diagnosis not present

## 2018-05-02 DIAGNOSIS — I10 Essential (primary) hypertension: Secondary | ICD-10-CM | POA: Diagnosis not present

## 2018-05-02 DIAGNOSIS — I6522 Occlusion and stenosis of left carotid artery: Secondary | ICD-10-CM | POA: Diagnosis not present

## 2018-05-02 DIAGNOSIS — Z9889 Other specified postprocedural states: Secondary | ICD-10-CM | POA: Diagnosis not present

## 2018-05-02 DIAGNOSIS — Z8673 Personal history of transient ischemic attack (TIA), and cerebral infarction without residual deficits: Secondary | ICD-10-CM | POA: Diagnosis not present

## 2018-05-02 DIAGNOSIS — R4701 Aphasia: Secondary | ICD-10-CM | POA: Diagnosis not present

## 2018-05-02 DIAGNOSIS — I6523 Occlusion and stenosis of bilateral carotid arteries: Secondary | ICD-10-CM | POA: Diagnosis not present

## 2018-05-28 ENCOUNTER — Encounter: Payer: Self-pay | Admitting: Cardiology

## 2018-05-28 NOTE — Telephone Encounter (Signed)
Follow up:   Patient returning call concerning appt. Please call patient back.

## 2018-05-28 NOTE — Telephone Encounter (Signed)
Unable to reach patient to set him up for mychart or video for 05/31/18 with Radford Pax

## 2018-05-28 NOTE — Telephone Encounter (Signed)
Pt has appt with Dr. Radford Pax 05/31/18

## 2018-05-28 NOTE — Telephone Encounter (Signed)
Patient called stating [name] she was returning phone call.

## 2018-05-29 ENCOUNTER — Telehealth: Payer: Self-pay | Admitting: Cardiology

## 2018-05-29 MED ORDER — APIXABAN 5 MG PO TABS: 5.0000 mg | ORAL_TABLET | Freq: Two times a day (BID) | ORAL | 1 refills | Status: DC

## 2018-05-29 NOTE — Telephone Encounter (Signed)
Pt remains on appropriate dose of Eliquis 5mg  BID based on age, weight, and renal function. Refill sent in to [facility].

## 2018-05-29 NOTE — Telephone Encounter (Signed)
Spoke with wife [name] aware of 05/31/18 appointment.  Patient has computer/smart phone and mychart.   Also waned you to know only have 1 week of Eliquis left.

## 2018-05-29 NOTE — Telephone Encounter (Signed)
TELEPHONE CALL NOTE  This patient has been deemed a candidate for follow-up tele-health visit to limit community exposure during the Covid-19 pandemic. I spoke with the patient via phone to discuss instructions. This has been outlined on the patient's AVS (dotphrase: hcevisitinfo). The patient was advised to review the section on consent for treatment as well. The patient will receive a phone call 2-3 days prior to their E-Visit at which time consent will be verbally confirmed. A Virtual Office Visit appointment type has been scheduled for 05/31/18 at 9:00 am with Dr. Radford Pax, with "VIDEO" or "TELEPHONE" in the appointment notes.  Sarina Ill, RN 05/29/2018 2:23 PM  TELEPHONE CALL NOTE  [name] has been deemed a candidate for a follow-up tele-health visit to limit community exposure during the Covid-19 pandemic. I spoke with the patient via phone to ensure availability of phone/video source, confirm preferred email & phone number, and discuss instructions and expectations.  I reminded [name] to be prepared with any vital sign and/or heart rhythm information that could potentially be obtained via home monitoring, at the time of his visit. I reminded [name] to expect a phone call at the time of his visit if his visit.  Did the patient verbally acknowledge consent to treatment? [place], RN 05/29/2018 2:23 PM   DOWNLOADING THE [place], go to [employer] and type in WebEx in the search bar. [place] [employer], the blue/green circle. The app is free but as with any other app downloads, their phone may require them to verify saved payment information or Apple password. The patient does NOT have to create an account.  - If Android, ask patient to go to [employer] and type in WebEx in the search bar. [place] [employer], the blue/green circle. The app is free but as with any other app  downloads, their phone may require them to verify saved payment information or Android password. The patient does NOT have to create an account.   CONSENT FOR TELE-HEALTH VISIT - PLEASE REVIEW  I hereby voluntarily request, consent and authorize [facility] and its employed or contracted physicians, physician assistants, nurse practitioners or other licensed health care professionals (the Practitioner), to provide me with telemedicine health care services (the "Services") as deemed necessary by the treating Practitioner. I acknowledge and consent to receive the Services by the Practitioner via telemedicine. I understand that the telemedicine visit will involve communicating with the Practitioner through live audiovisual communication technology and the disclosure of certain medical information by electronic transmission. I acknowledge that I have been given the opportunity to request an in-person assessment or other available alternative prior to the telemedicine visit and am voluntarily participating in the telemedicine visit.  I understand that I have the right to withhold or withdraw my consent to the use of telemedicine in the course of my care at any time, without affecting my right to future care or treatment, and that the Practitioner or I may terminate the telemedicine visit at any time. I understand that I have the right to inspect all information obtained and/or recorded in the course of the telemedicine visit and may receive copies of available information for a reasonable fee.  I understand that some of the potential risks of receiving the Services via telemedicine include:  Marland Kitchen Delay or interruption in medical evaluation due to technological equipment failure or disruption; . Information transmitted may not be  sufficient (e.g. poor resolution of images) to allow for appropriate medical decision making by the Practitioner; and/or  . In rare instances, security protocols could fail, causing a  breach of personal health information.  Furthermore, I acknowledge that it is my responsibility to provide information about my medical history, conditions and care that is complete and accurate to the best of my ability. I acknowledge that Practitioner's advice, recommendations, and/or decision may be based on factors not within their control, such as incomplete or inaccurate data provided by me or distortions of diagnostic images or specimens that may result from electronic transmissions. I understand that the practice of medicine is not an exact science and that Practitioner makes no warranties or guarantees regarding treatment outcomes. I acknowledge that I will receive a copy of this consent concurrently upon execution via email to the email address I last provided but may also request a printed copy by calling the office of [place].    I understand that my insurance will be billed for this visit.   I have read or had this consent read to me. . I understand the contents of this consent, which adequately explains the benefits and risks of the Services being provided via telemedicine.  . I have been provided ample opportunity to ask questions regarding this consent and the Services and have had my questions answered to my satisfaction. . I give my informed consent for the services to be provided through the use of telemedicine in my medical care  By participating in this telemedicine visit I agree to the above.

## 2018-05-29 NOTE — Telephone Encounter (Signed)
Duplicate encounter

## 2018-05-30 NOTE — Progress Notes (Signed)
Virtual Visit via Video Note    Evaluation Performed:  Follow-up visit  This visit type was conducted due to national recommendations for restrictions regarding the COVID-19 Pandemic (e.g. social distancing).  This format is felt to be most appropriate for this patient at this time.  All issues noted in this document were discussed and addressed.  No physical exam was performed (except for noted visual exam findings with Video Visits).  Please refer to the patient's chart (MyChart message for video visits and phone note for telephone visits) for the patient's consent to telehealth for [facility].  Date:  06/01/2018   ID:  [name], DOB [birth year], MRN [id number]  Patient Location:  Home  Provider location:   [place]  PCP:  Hulan Fess, MD  Cardiologist:  Fransico Him, MD Electrophysiologist: [name], MD  Chief Complaint:  Atrial fibrillation and dilated aortic root  History of Present Illness:    [name] is a 63 y.o. male who presents via audio/video conferencing for a telehealth visit today.    [name] is a 63 y.o. male with a history of PAF s/p afib ablation 02/2017 with CHADS2VASC score 4 (CVA, age>65, vascular dz) on Apixaban.He also has a history of dilated aortic root by echo which also showed normal LVF.  He also had carotid artery stenosis with 60-70% right ICA stenosis and occluded Left ICA with prior CVA  S/p Right CEA at [facility] and hyperlipidemia.    He is here today for followup and is doing well.  He denies any chest pain or pressure, SOB, DOE, PND, orthopnea, LE edema, dizziness, palpitations or syncope. He is compliant with his meds and is tolerating meds with no SE.    The patient does not have symptoms concerning for COVID-19 infection (fever, chills, cough, or new shortness of breath).    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    Dilated aortic root ([place]) 10/20/2015   Hyperlipidemia LDL goal <70 05/31/2016   ICAO (internal carotid artery occlusion), left 07/10/2016   chronically occluded left ICA and s/p right CEA at [facility]   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   apixaban (ELIQUIS) 5 MG TABS tablet Take 1 tablet (5 mg total) by mouth 2 (two) times daily.   aspirin EC 81 MG tablet Take 81 mg by mouth daily.   atorvastatin (LIPITOR) 40 MG tablet TAKE 1 TABLET DAILY AT 6PM.   fluticasone (FLONASE) 50 MCG/ACT  nasal spray Place 1 spray into both nostrils at bedtime.    levothyroxine (SYNTHROID, LEVOTHROID) 25 MCG tablet Take 25 mcg by mouth daily before breakfast.    polyethylene glycol (MIRALAX / GLYCOLAX) packet Take 17 g by mouth daily.   pregabalin (LYRICA) 200 MG capsule Take 1 capsule (200 mg total) by mouth 3 (three) times daily.   Probiotic Product (PROBIOTIC PO) Take 1 tablet by mouth at bedtime.   senna (SENOKOT) 8.6 MG tablet Take 2 tablets by mouth 2 (two) times daily. Bedtime    traMADol (ULTRAM) 50 MG tablet TAKE  2 TABLETS 4 TIMES     DAILY     Allergies:   Patient has no known allergies.   Social History   Tobacco Use   Smoking status: Never Smoker   Smokeless tobacco: Never Used  Substance Use Topics   Alcohol use: Yes    Alcohol/week: 1.0 standard drinks    Types: 1 Cans of beer per week    Comment: occasionally maybe the weekends   Drug use: No     Family Hx: The patient's family history includes Cancer in his mother; Heart failure in his father.  ROS:   Please see the history of present illness.     All other systems reviewed and are negative.   Labs/Other Tests and Data Reviewed:    Recent Labs: No results found for requested labs within last 8760 hours.   Recent Lipid Panel Lab Results  Component Value Date/Time   CHOL 108 06/22/2016 09:52 AM   TRIG 75 06/22/2016 09:52 AM   HDL 29 (L) 06/22/2016 09:52 AM   CHOLHDL 3.7 06/22/2016 09:52 AM   CHOLHDL 5.0 09/25/2014 04:00 AM   LDLCALC 64 06/22/2016 09:52 AM    Wt Readings from Last 3 Encounters:  05/31/18 188 lb (85.3 kg)  01/04/18 195 lb (88.5 kg)  11/15/17 197 lb 14.4 oz (89.8 kg)     Objective:    Vital Signs:  BP 119/79    Pulse 69    Ht 6' 1.5" (1.867 m)    Wt 188 lb (85.3 kg)    BMI 24.47 kg/m    Well nourished, well developed male in no acute distress. Well appearing, alert and conversant, regular work of breathing,  good skin color  Eyes- anicteric mouth- oral mucosa is pink  neuro- grossly intact skin- no apparent rash or lesions or cyanosis  ASSESSMENT & PLAN:    1.  Persistent atrial fibrillation - he is s/p afib ablation 02/2017 and he thinks that he is maintaining NSR.  He tells me his pulse is not irregular.  He will continue on Apixaban 5mg  BID for CHADS2VASC score of 4 (CVA-2, age>65-1, vascular dz-1).His creatinine was stable at 0.92 last fall.  He says that his PCP will be checking his BMET and CBC in June at his PE and I asked him to send me a copy of the labs.  2.  Carotid artery  stenosis last dopplers in 2017 showed chronically occluded left ICA stenosis and 60-79% right carotid stenosis.  He is s/p right CEA at [facility].  Dopplers 04/2018 showed patent right CEA and chronically occluded LICA. Continue on statin and ASA.  3.  Hyperlipidemia with LDL goal < 70 - His LDL was 60 last fall.    He will continue on atorvastatin 40mg  daily. His PCP is checking his Lipids in June.  4.  Dilated aortic root - Cardiac CT 02/2017 showed 3.8cm aortic root.  Continue statin.  BP is controlled. Repeat Chest CTA to make sure this is stable.     COVID-19 Education: The signs and symptoms of COVID-19 were discussed with the patient and how to seek care for testing (follow up with PCP or arrange E-visit).  The importance of social distancing was discussed today.  Patient Risk:   After full review of this patient's clinical status, I feel that they are at least moderate risk at this time.  Time:   Today, I have spent 25 minutes reviewing the chart and discussing with the patient his medical problems as well as developing a plan with followup.using  telehealth technology.     Medication Adjustments/Labs and Tests Ordered: Current medicines are reviewed at length with the patient today.  Concerns regarding medicines are outlined above.  Tests Ordered: Orders Placed This Encounter  Procedures   CT ANGIO CHEST AORTA W &/OR WO CONTRAST   Medication Changes: No orders of the defined types were placed in this encounter.   Disposition:  Follow up in 1 year(s)  Signed, Fransico Him, MD  06/01/2018 7:26 PM    [place]

## 2018-05-31 ENCOUNTER — Other Ambulatory Visit: Payer: Self-pay

## 2018-05-31 ENCOUNTER — Telehealth (INDEPENDENT_AMBULATORY_CARE_PROVIDER_SITE_OTHER): Payer: Medicare HMO | Admitting: Cardiology

## 2018-05-31 ENCOUNTER — Encounter: Payer: Self-pay | Admitting: Cardiology

## 2018-05-31 VITALS — BP 119/79 | HR 69 | Ht 73.5 in | Wt 188.0 lb

## 2018-05-31 DIAGNOSIS — E785 Hyperlipidemia, unspecified: Secondary | ICD-10-CM

## 2018-05-31 DIAGNOSIS — I4819 Other persistent atrial fibrillation: Secondary | ICD-10-CM

## 2018-05-31 DIAGNOSIS — Z7982 Long term (current) use of aspirin: Secondary | ICD-10-CM

## 2018-05-31 DIAGNOSIS — I6523 Occlusion and stenosis of bilateral carotid arteries: Secondary | ICD-10-CM | POA: Diagnosis not present

## 2018-05-31 DIAGNOSIS — Z79899 Other long term (current) drug therapy: Secondary | ICD-10-CM

## 2018-05-31 DIAGNOSIS — I77810 Thoracic aortic ectasia: Secondary | ICD-10-CM | POA: Diagnosis not present

## 2018-05-31 NOTE — Patient Instructions (Signed)
Medication Instructions:  Your physician recommends that you continue on your current medications as directed. Please refer to the Current Medication list given to you today.  If you need a refill on your cardiac medications before your next appointment, please call your pharmacy.   Lab work: None If you have labs (blood work) drawn today and your tests are completely normal, you will receive your results only by: Marland Kitchen MyChart Message (if you have MyChart) OR . A paper copy in the mail If you have any lab test that is abnormal or we need to change your treatment, we will call you to review the results.  Testing/Procedures: Non-Cardiac CT Angiography (CTA), schedule around July 08/28/18, is a special type of CT scan that uses a computer to produce multi-dimensional views of major blood vessels throughout the body. In CT angiography, a contrast material is injected through an IV to help visualize the blood vessels  Follow-Up: At [facility], you and your health needs are our priority.  As part of our continuing mission to provide you with exceptional heart care, we have created designated Provider Care Teams.  These Care Teams include your primary Cardiologist (physician) and Advanced Practice Providers (APPs -  Physician Assistants and Nurse Practitioners) who all work together to provide you with the care you need, when you need it. You will need a follow up appointment in 6 months.  Please call our office 2 months in advance to schedule this appointment.  You may see Dr. Radford Pax or one of the following Advanced Practice Providers on your designated Care Team:   [place], PA-C Melina Copa, PA-C . Ermalinda Barrios, PA-C

## 2018-06-07 ENCOUNTER — Other Ambulatory Visit: Payer: Self-pay

## 2018-06-07 DIAGNOSIS — I4819 Other persistent atrial fibrillation: Secondary | ICD-10-CM

## 2018-06-07 DIAGNOSIS — I77810 Thoracic aortic ectasia: Secondary | ICD-10-CM

## 2018-06-07 NOTE — Progress Notes (Signed)
BMET for CTA in July.

## 2018-06-10 ENCOUNTER — Other Ambulatory Visit: Payer: Self-pay | Admitting: Cardiology

## 2018-06-13 DIAGNOSIS — N200 Calculus of kidney: Secondary | ICD-10-CM | POA: Diagnosis not present

## 2018-07-05 ENCOUNTER — Other Ambulatory Visit: Payer: Self-pay | Admitting: Registered Nurse

## 2018-07-05 DIAGNOSIS — Q631 Lobulated, fused and horseshoe kidney: Secondary | ICD-10-CM | POA: Diagnosis not present

## 2018-07-05 DIAGNOSIS — N281 Cyst of kidney, acquired: Secondary | ICD-10-CM | POA: Diagnosis not present

## 2018-07-08 DIAGNOSIS — Q631 Lobulated, fused and horseshoe kidney: Secondary | ICD-10-CM | POA: Diagnosis not present

## 2018-07-08 DIAGNOSIS — N200 Calculus of kidney: Secondary | ICD-10-CM | POA: Diagnosis not present

## 2018-07-08 NOTE — Telephone Encounter (Signed)
Rec'd electronic interface Rx request for a 90 day supply of tramadol 50 mg.  According to rhe will run out tomorrow or Wednesday.  His next appointment is with Zella Ball on 07/17/2018 (9 days from today). Please advise or refill

## 2018-07-08 NOTE — Telephone Encounter (Signed)
Please find out if pt has enough to last until  appt with Zella Ball or if appt needs to be moved up

## 2018-07-10 ENCOUNTER — Telehealth: Payer: Self-pay | Admitting: *Deleted

## 2018-07-10 MED ORDER — TRAMADOL HCL 50 MG PO TABS: ORAL_TABLET | ORAL | 1 refills | Status: DC

## 2018-07-10 NOTE — Telephone Encounter (Signed)
Mrs Mcbrayer called to report that Mr Laura will be out of Tramadol by Sunday.  His appt is with Zella Ball next Wednesday 07/17/18.   I have refilled his tramadol for 6 months (last fill date was 04/11/2018 per the PMP). Left detailed message on home phone VM per DPR.

## 2018-07-17 ENCOUNTER — Encounter: Payer: Self-pay | Admitting: Registered Nurse

## 2018-07-17 ENCOUNTER — Encounter: Payer: Medicare HMO | Attending: Registered Nurse | Admitting: Registered Nurse

## 2018-07-17 ENCOUNTER — Other Ambulatory Visit: Payer: Self-pay

## 2018-07-17 VITALS — BP 108/67 | HR 67 | Ht 73.0 in | Wt 190.0 lb

## 2018-07-17 DIAGNOSIS — Z5181 Encounter for therapeutic drug level monitoring: Secondary | ICD-10-CM | POA: Insufficient documentation

## 2018-07-17 DIAGNOSIS — G894 Chronic pain syndrome: Secondary | ICD-10-CM | POA: Insufficient documentation

## 2018-07-17 DIAGNOSIS — G890 Central pain syndrome: Secondary | ICD-10-CM | POA: Diagnosis not present

## 2018-07-17 DIAGNOSIS — Z79899 Other long term (current) drug therapy: Secondary | ICD-10-CM | POA: Insufficient documentation

## 2018-07-17 DIAGNOSIS — Z79891 Long term (current) use of opiate analgesic: Secondary | ICD-10-CM | POA: Diagnosis not present

## 2018-07-17 NOTE — Progress Notes (Signed)
Subjective:    Patient ID: [name], male    DOB: Nov 05, 1955, 63 y.o.   MRN: [id number]  HPI: [name] is a 63 y.o. male who returns for follow up appointment for chronic pain and medication refill. He states his pain is located in his right lower extremity. He rates his  Pain 7. His  current exercise regime is walking.   [name] equivalent is 40.00 MME. Last UDS was performed on 07/06/2017, it was consistent. UDS ordered today.   Mrs. Poynter in the room, all questions answered.     Pain Inventory Average Pain 7 Pain Right Now 7 My pain is constant and dull  In the last 24 hours, has pain interfered with the following? General activity 6 Relation with others 5 Enjoyment of life 5 What TIME of day is your pain at its worst? morning Sleep (in general) NA  Pain is worse with: walking, bending, sitting, inactivity, standing and some activites Pain improves with: heat/ice and medication Relief from Meds: 4  Mobility walk without assistance how many minutes can you walk? 60 ability to climb steps?  yes do you drive?  no  Function disabled: date disabled .  Neuro/Psych numbness spasms  Prior Studies Any changes since last visit?  no  Physicians involved in your care Any changes since last visit?  no   Family History  Problem Relation Age of Onset  . Cancer Mother   . Heart failure Father    Social History   Socioeconomic History  . Marital status: Married    Spouse name: Not on file  . Number of children: Not on file  . Years of education: Not on file  . Highest education level: Not on file  Occupational History  . Not on file  Social Needs  . Financial resource strain: Not on file  . Food insecurity:    Worry: Not on file    Inability: Not on file  . Transportation needs:    Medical: Not on file    Non-medical: Not on file  Tobacco Use  . Smoking status: Never Smoker  . Smokeless tobacco: Never Used  Substance and  Sexual Activity  . Alcohol use: Yes    Alcohol/week: 1.0 standard drinks    Types: 1 Cans of beer per week    Comment: occasionally maybe the weekends  . Drug use: No  . Sexual activity: Not on file  Lifestyle  . Physical activity:    Days per week: Not on file    Minutes per session: Not on file  . Stress: Not on file  Relationships  . Social connections:    Talks on phone: Not on file    Gets together: Not on file    Attends religious service: Not on file    Active member of club or organization: Not on file    Attends meetings of clubs or organizations: Not on file    Relationship status: Not on file  Other Topics Concern  . Not on file  Social History Narrative  . Not on file   Past Surgical History:  Procedure Laterality Date  . ATRIAL FIBRILLATION ABLATION N/A 03/27/2017   Procedure: ATRIAL FIBRILLATION ABLATION;  Surgeon: [name], MD;  Location: [facility];  Service: Cardiovascular;  Laterality: N/A;  . LITHOTRIPSY    . VASECTOMY     Past Medical History:  Diagnosis Date  . Altered sensation due to stroke 12/10/2014  . Aphasia due  to stroke 09/29/2014  . Basal cell carcinoma   . Bradycardia 10/21/2015  . Contusion of calf, left, initial encounter 12/03/2015  . 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 Dilated aortic root ([place]) 10/20/2015  . Hyperlipidemia LDL goal <70 05/31/2016  . ICAO (internal carotid artery occlusion), left 07/10/2016   chronically occluded left ICA and s/p right CEA at [facility]  . 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 Nephrolithiasis   . Neuropathy   . PAF (paroxysmal atrial fibrillation) ([place]) 07/10/2016   s/p afib ablation 02/2017  . Typical atrial flutter (HCC)    BP 108/67   Pulse 67   Ht 6\' 1"  (1.854 m)   Wt 190 lb (86.2 kg)   SpO2 97%   BMI 25.07 kg/m   Opioid Risk Score:   Fall Risk Score:  `1  Depression screen PHQ 2/9  Depression screen [facility] 2/9  12/29/2016 06/04/2015 10/23/2014  Decreased Interest 0 0 0  Down, Depressed, Hopeless 0 0 0  PHQ - 2 Score 0 0 0  Altered sleeping - - 3  Tired, decreased energy - - 3  Change in appetite - - 0  Feeling bad or failure about yourself  - - 0  Trouble concentrating - - 0  Moving slowly or fidgety/restless - - 0  Suicidal thoughts - - 0  PHQ-9 Score - - 6  Difficult doing work/chores - - Not difficult at all    Review of Systems  Constitutional: Negative.   HENT: Negative.   Eyes: Negative.   Respiratory: Negative.   Cardiovascular: Negative.   Gastrointestinal: Negative.   Endocrine: Negative.   Genitourinary: Positive for difficulty urinating.  Musculoskeletal: Positive for arthralgias and gait problem.       Spasms   Skin: Negative.   Allergic/Immunologic: Negative.   Neurological: Positive for numbness.  Hematological: Negative.   Psychiatric/Behavioral: Negative.   All other systems reviewed and are negative.      Objective:   Physical Exam Vitals signs and nursing note reviewed.  Constitutional:      Appearance: Normal appearance.  Neck:     Musculoskeletal: Normal range of motion and neck supple.  Cardiovascular:     Rate and Rhythm: Normal rate and regular rhythm.     Pulses: Normal pulses.     Heart sounds: Normal heart sounds.  Pulmonary:     Effort: Pulmonary effort is normal.     Breath sounds: Normal breath sounds.  Musculoskeletal:     Comments: Normal Muscle Bulk and Muscle Testing Reveals:  Upper Extremities: Full ROM and Muscle Strength 5/5  Lower Extremities : Right: Decreased ROM and Muscle Strength 5/5 Right Lower Extremity Flexion Produces Pain into extremity.  Left: Full ROM and Muscle Strength 5/5 Arises from Table with ease Narrow Based Gait   Skin:    General: Skin is warm and dry.  Neurological:     Mental Status: He is alert and oriented to person, place, and time.           Assessment & Plan:  1. 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 #720 90 day supply.  2. Chronic Pain: Continue Tramadol. Continue to Monitor.  We will continue the opioid monitoring program, this consists of regular clinic visits, examinations, urine drug screen, pill counts as well as use of [place] Controlled Substance Reporting system.  15  Minutes minutes of face to face patient care time was spent during this visit. All questions were encouraged and answered.  F/U in 6 months

## 2018-07-23 LAB — TOXASSURE SELECT,+ANTIDEPR,UR

## 2018-07-24 ENCOUNTER — Telehealth: Payer: Self-pay | Admitting: *Deleted

## 2018-07-24 NOTE — Telephone Encounter (Signed)
Urine drug screen for this encounter is consistent for prescribed medication 

## 2018-08-26 ENCOUNTER — Other Ambulatory Visit: Payer: Medicare HMO

## 2018-08-26 ENCOUNTER — Other Ambulatory Visit: Payer: Self-pay

## 2018-08-26 DIAGNOSIS — I4819 Other persistent atrial fibrillation: Secondary | ICD-10-CM

## 2018-08-26 DIAGNOSIS — I77810 Thoracic aortic ectasia: Secondary | ICD-10-CM

## 2018-08-26 LAB — BASIC METABOLIC PANEL
BUN/Creatinine Ratio: 12 (ref 10–24)
BUN: 11 mg/dL (ref 8–27)
CO2: 26 mmol/L (ref 20–29)
Calcium: 9.2 mg/dL (ref 8.6–10.2)
Chloride: 100 mmol/L (ref 96–106)
Creatinine, Ser: 0.9 mg/dL (ref 0.76–1.27)
GFR calc Af Amer: 103 mL/min/{1.73_m2} (ref >59)
GFR calc non Af Amer: 89 mL/min/{1.73_m2} (ref >59)
Glucose: 82 mg/dL (ref 65–99)
Potassium: 4.4 mmol/L (ref 3.5–5.2)
Sodium: 140 mmol/L (ref 134–144)

## 2018-09-02 ENCOUNTER — Ambulatory Visit (INDEPENDENT_AMBULATORY_CARE_PROVIDER_SITE_OTHER)
Admission: RE | Admit: 2018-09-02 | Discharge: 2018-09-02 | Disposition: A | Discharge status: Home / Self Care | Payer: Medicare HMO | Source: Ambulatory Visit | Attending: Cardiology | Admitting: Cardiology

## 2018-09-02 ENCOUNTER — Other Ambulatory Visit: Payer: Self-pay

## 2018-09-02 DIAGNOSIS — I77810 Thoracic aortic ectasia: Secondary | ICD-10-CM | POA: Diagnosis not present

## 2018-09-02 MED ORDER — IOHEXOL 350 MG/ML SOLN
100.0000 mL | Freq: Once | INTRAVENOUS | Status: AC | PRN
  Administered 2018-09-02: 100 mL via INTRAVENOUS

## 2018-09-04 ENCOUNTER — Telehealth: Payer: Self-pay

## 2018-09-04 DIAGNOSIS — I77810 Thoracic aortic ectasia: Secondary | ICD-10-CM

## 2018-09-04 NOTE — Telephone Encounter (Signed)
Notes recorded by Sueanne Margarita, MD on 09/03/2018 at 5:06 PM EDT  Chest CT showed borderline enlargement of ascending thoracic aorta at 3.6 x 3.6 cm and sinotubular junction at 3.8cm. Repeat CT in 1 year for stability

## 2018-09-04 NOTE — Telephone Encounter (Signed)
Left a detailed message about the result, per DPR, advised to call back if questions.

## 2018-09-18 DIAGNOSIS — G8929 Other chronic pain: Secondary | ICD-10-CM | POA: Diagnosis not present

## 2018-09-18 DIAGNOSIS — J0100 Acute maxillary sinusitis, unspecified: Secondary | ICD-10-CM | POA: Diagnosis not present

## 2018-10-07 ENCOUNTER — Other Ambulatory Visit: Payer: Self-pay

## 2018-10-07 ENCOUNTER — Encounter: Payer: Self-pay | Admitting: Physical Medicine & Rehabilitation

## 2018-10-07 ENCOUNTER — Encounter: Payer: Medicare HMO | Attending: Registered Nurse | Admitting: Physical Medicine & Rehabilitation

## 2018-10-07 ENCOUNTER — Other Ambulatory Visit: Payer: Self-pay | Admitting: Registered Nurse

## 2018-10-07 ENCOUNTER — Encounter

## 2018-10-07 VITALS — BP 108/74 | HR 77 | Temp 98.7°F | Ht 73.0 in | Wt 192.0 lb

## 2018-10-07 DIAGNOSIS — Z5181 Encounter for therapeutic drug level monitoring: Secondary | ICD-10-CM | POA: Insufficient documentation

## 2018-10-07 DIAGNOSIS — G894 Chronic pain syndrome: Secondary | ICD-10-CM | POA: Insufficient documentation

## 2018-10-07 DIAGNOSIS — G890 Central pain syndrome: Secondary | ICD-10-CM | POA: Diagnosis not present

## 2018-10-07 DIAGNOSIS — Z79899 Other long term (current) drug therapy: Secondary | ICD-10-CM | POA: Diagnosis not present

## 2018-10-07 MED ORDER — PREGABALIN 200 MG PO CAPS: 200.0000 mg | ORAL_CAPSULE | Freq: Three times a day (TID) | ORAL | 2 refills | Status: DC

## 2018-10-07 MED ORDER — NORTRIPTYLINE HCL 10 MG PO CAPS
10.0000 mg | ORAL_CAPSULE | Freq: Every day | ORAL | 2 refills | Status: DC
Start: 2018-10-07 — End: 2019-01-09

## 2018-10-07 NOTE — Progress Notes (Signed)
Subjective:    Patient ID: [name], male    DOB: 03-May-1955, 63 y.o.   MRN: [id number]  HPI 63 year old male with history of left MCA infarct onset 09/28/2014.  He has a history of Dejerine Roussy syndrome. RIght LE pain increasing, calf is hurting more than than thigh , also feels numb in foot Pain Inventory Average Pain 8 Pain Right Now 8 My pain is constant, burning, tingling and aching  In the last 24 hours, has pain interfered with the following? General activity 6 Relation with others 6 Enjoyment of life 6 What TIME of day is your pain at its worst? morning Sleep (in general) Good  Pain is worse with: walking and standing Pain improves with: rest and heat/ice Relief from Meds: 5  Mobility walk without assistance ability to climb steps?  yes do you drive?  no  Function disabled: date disabled 2016  Neuro/Psych numbness tingling spasms  Prior Studies Any changes since last visit?  no  Physicians involved in your care Any changes since last visit?  no   Family History  Problem Relation Age of Onset  . Cancer Mother   . Heart failure Father    Social History   Socioeconomic History  . Marital status: Married    Spouse name: Not on file  . Number of children: Not on file  . Years of education: Not on file  . Highest education level: Not on file  Occupational History  . Not on file  Social Needs  . Financial resource strain: Not on file  . Food insecurity    Worry: Not on file    Inability: Not on file  . Transportation needs    Medical: Not on file    Non-medical: Not on file  Tobacco Use  . Smoking status: Never Smoker  . Smokeless tobacco: Never Used  Substance and Sexual Activity  . Alcohol use: Yes    Alcohol/week: 1.0 standard drinks    Types: 1 Cans of beer per week    Comment: occasionally maybe the weekends  . Drug use: No  . Sexual activity: Not on file  Lifestyle  . Physical activity    Days per week: Not on file     Minutes per session: Not on file  . Stress: Not on file  Relationships  . Social Herbalist on phone: Not on file    Gets together: Not on file    Attends religious service: Not on file    Active member of club or organization: Not on file    Attends meetings of clubs or organizations: Not on file    Relationship status: Not on file  Other Topics Concern  . Not on file  Social History Narrative  . Not on file   Past Surgical History:  Procedure Laterality Date  . ATRIAL FIBRILLATION ABLATION N/A 03/27/2017   Procedure: ATRIAL FIBRILLATION ABLATION;  Surgeon: [name], MD;  Location: [place];  Service: Cardiovascular;  Laterality: N/A;  . LITHOTRIPSY    . VASECTOMY     Past Medical History:  Diagnosis Date  . Altered sensation due to stroke 12/10/2014  . 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 Basal cell carcinoma   . Bradycardia 10/21/2015  . Contusion of calf, left, initial encounter 12/03/2015  . 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 Dilated aortic root ([place]) 10/20/2015  . Hyperlipidemia LDL goal <70  05/31/2016  . ICAO (internal carotid artery occlusion), left 07/10/2016   chronically occluded left ICA and s/p right CEA at [facility]  . 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 Nephrolithiasis   . Neuropathy   . PAF (paroxysmal atrial fibrillation) ([place]) 07/10/2016   s/p afib ablation 02/2017  . Typical atrial flutter (HCC)    There were no vitals taken for this visit.  Opioid Risk Score:   Fall Risk Score:  `1  Depression screen PHQ 2/9  Depression screen [facility] 2/9 12/29/2016 06/04/2015 10/23/2014  Decreased Interest 0 0 0  Down, Depressed, Hopeless 0 0 0  PHQ - 2 Score 0 0 0  Altered sleeping - - 3  Tired, decreased energy - - 3  Change in appetite - - 0  Feeling bad or failure about yourself  - - 0  Trouble concentrating - - 0  Moving slowly or fidgety/restless - - 0  Suicidal thoughts -  - 0  PHQ-9 Score - - 6  Difficult doing work/chores - - Not difficult at all     Review of Systems  Constitutional: Negative.   HENT: Negative.   Eyes: Negative.   Respiratory: Negative.   Cardiovascular: Negative.   Gastrointestinal: Negative.   Endocrine: Negative.   Genitourinary: Negative.   Musculoskeletal: Positive for gait problem and myalgias.  Skin: Negative.   Allergic/Immunologic: Negative.   Neurological: Positive for numbness.  Hematological: Negative.   Psychiatric/Behavioral: Negative.   All other systems reviewed and are negative.      Objective:   Physical Exam Vitals signs and nursing note reviewed.  Constitutional:      Appearance: Normal appearance.  HENT:     Head: Normocephalic and atraumatic.     Mouth/Throat:     Mouth: Mucous membranes are dry.  Eyes:     Extraocular Movements: Extraocular movements intact.     Pupils: Pupils are equal, round, and reactive to light.  Musculoskeletal:     Comments: Negative calf pain negative calf swelling on the right side no pain in the knee no pain with ankle or knee or hip range of motion.  Skin:    General: Skin is warm and dry.  Neurological:     Mental Status: He is alert and oriented to person, place, and time.     Comments: Receptive greater than expressive aphasia Ideomotor apraxia has difficulty following commands for manual muscle testing. Strength appears at 5/5 bilateral upper and lower limbs Negative straight leg raising right lower extremity No pain with palpation  Psychiatric:        Mood and Affect: Mood normal.        Behavior: Behavior normal.           Assessment & Plan:  #1.  Central poststroke pain syndrome we discussed with patient and his wife this a difficult diagnosis.  He is at a maximum dose of tramadol 100 mg 4 times daily.  In addition he is taking maximum dose Lyrica 200 3 times daily.  Despite these medications he still complaining of some increasing pain.  We  discussed pros and cons of different pain medications he is already tried Cymbalta which was not effective he is already tried Nucynta which caused drowsiness and palpitations. We will give a trial of nortriptyline 10 mg nightly.  Follow-up in 3 months.  There are non-medication treatments such as transcranial magnetic stimulation, acupuncture, massage

## 2018-10-09 DIAGNOSIS — R69 Illness, unspecified: Secondary | ICD-10-CM | POA: Diagnosis not present

## 2018-10-25 ENCOUNTER — Ambulatory Visit: Payer: Medicare HMO | Admitting: Physical Medicine & Rehabilitation

## 2018-11-06 ENCOUNTER — Telehealth: Payer: Self-pay

## 2018-11-06 ENCOUNTER — Telehealth: Payer: Self-pay | Admitting: Cardiology

## 2018-11-06 NOTE — Telephone Encounter (Signed)
Tried calling patient on the number listed for his home number to go over Pre-op recommendation. A woman answered the phone she stated that she could not her me and would call back later. Will try calling the patient again.

## 2018-11-06 NOTE — Telephone Encounter (Signed)
New Message      [facility] Pre-operative Risk Assessment    Request for surgical clearance:  1. What type of surgery is being performed? One tooth extraction   2. When is this surgery scheduled? 11/07/18   3. What type of clearance is required (medical clearance vs. Pharmacy clearance to hold med vs. Both)? Both  4. Are there any medications that need to be held prior to surgery and how long?  Aspirin and Eliquis. Cardiologist recommendation  5. Practice name and name of physician performing surgery? Dr. Arita Miss DDS   6. What is your office phone number [phone]   7.   What is your office fax number [phone]  8.   Anesthesia type (None, local, MAC, general) ? General    [name] 11/06/2018, 11:07 AM  _________________________________________________________________   (provider comments below)

## 2018-11-06 NOTE — Telephone Encounter (Signed)
   Primary Cardiologist: Dr. Radford Pax / Dr. Rayann Heman  Chart reviewed as part of pre-operative protocol coverage. Simple dental extractions are considered low risk procedures per guidelines and generally do not require any specific cardiac clearance. It is also generally accepted that for simple extractions and dental cleanings, there is no need to interrupt blood thinner therapy. We typically do not recommend stopping aspirin and eliquis for 1-2 teeth extraction. If more than 2 teeth are being extracted, please let us know so our clinical pharmacist can review.   SBE prophylaxis is not required for the patient as he does not have prior history of valve procedure.  I will route this recommendation to the requesting party via Epic fax function and remove from pre-op pool.  Please call with questions.  Elizabeth, [place] 11/06/2018, 3:08 PM

## 2018-11-06 NOTE — Telephone Encounter (Signed)
Tried calling patient on the number listed for his home number. A woman answered the phone she stated that she could not her me and would call back later. Will try calling the patient again.

## 2018-11-07 DIAGNOSIS — R69 Illness, unspecified: Secondary | ICD-10-CM | POA: Diagnosis not present

## 2018-11-14 DIAGNOSIS — D225 Melanocytic nevi of trunk: Secondary | ICD-10-CM | POA: Diagnosis not present

## 2018-11-14 DIAGNOSIS — D2361 Other benign neoplasm of skin of right upper limb, including shoulder: Secondary | ICD-10-CM | POA: Diagnosis not present

## 2018-11-14 DIAGNOSIS — L821 Other seborrheic keratosis: Secondary | ICD-10-CM | POA: Diagnosis not present

## 2018-11-14 DIAGNOSIS — R69 Illness, unspecified: Secondary | ICD-10-CM | POA: Diagnosis not present

## 2018-11-14 DIAGNOSIS — L565 Disseminated superficial actinic porokeratosis (DSAP): Secondary | ICD-10-CM | POA: Diagnosis not present

## 2018-11-14 DIAGNOSIS — D1801 Hemangioma of skin and subcutaneous tissue: Secondary | ICD-10-CM | POA: Diagnosis not present

## 2018-11-14 DIAGNOSIS — Z85828 Personal history of other malignant neoplasm of skin: Secondary | ICD-10-CM | POA: Diagnosis not present

## 2018-11-14 DIAGNOSIS — L814 Other melanin hyperpigmentation: Secondary | ICD-10-CM | POA: Diagnosis not present

## 2018-11-14 DIAGNOSIS — L570 Actinic keratosis: Secondary | ICD-10-CM | POA: Diagnosis not present

## 2018-11-18 ENCOUNTER — Telehealth: Payer: Self-pay

## 2018-11-18 NOTE — Telephone Encounter (Signed)
Left a message regarding appt on 11/20/18.

## 2018-11-19 DIAGNOSIS — L237 Allergic contact dermatitis due to plants, except food: Secondary | ICD-10-CM | POA: Diagnosis not present

## 2018-11-20 ENCOUNTER — Encounter: Payer: Self-pay | Admitting: Internal Medicine

## 2018-11-20 ENCOUNTER — Telehealth (INDEPENDENT_AMBULATORY_CARE_PROVIDER_SITE_OTHER): Payer: Medicare HMO | Admitting: Internal Medicine

## 2018-11-20 VITALS — BP 118/78 | HR 80 | Ht 73.0 in | Wt 196.0 lb

## 2018-11-20 DIAGNOSIS — I480 Paroxysmal atrial fibrillation: Secondary | ICD-10-CM

## 2018-11-20 NOTE — Progress Notes (Signed)
Electrophysiology TeleHealth Note   Due to national recommendations of social distancing due to COVID 19, an audio/video telehealth visit is felt to be most appropriate for this patient at this time.  See MyChart message from today for the patient's consent to telehealth for [facility].  Date:  11/20/2018   ID:  [name], DOB 16-Nov-1955, MRN [id number]  Location: patient's home  Provider location:  [place]   Evaluation Performed: Follow-up visit  PCP:  Hulan Fess, MD   Electrophysiologist:  Dr Rayann Heman  Chief Complaint:  palpitations  History of Present Illness:    [name] is a 63 y.o. male who presents via telehealth conferencing today.  Since last being seen in our clinic, the patient reports doing very well.  Today, he denies symptoms of palpitations, chest pain, shortness of breath,  lower extremity edema, dizziness, presyncope, or syncope.  [name].  The patient denies symptoms of fevers, chills, cough, or new SOB worrisome for COVID 19.  Past Medical History:  Diagnosis Date  . Altered sensation due to stroke 12/10/2014  . 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 Basal cell carcinoma   . Bradycardia 10/21/2015  . Contusion of calf, left, initial encounter 12/03/2015  . 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 Dilated aortic root ([place]) 10/20/2015  . Hyperlipidemia LDL goal <70 05/31/2016  . ICAO (internal carotid artery occlusion), left 07/10/2016   chronically occluded left ICA and s/p right CEA at [facility]  . 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 Nephrolithiasis   . Neuropathy   . PAF (paroxysmal atrial fibrillation) ([place]) 07/10/2016   s/p afib ablation 02/2017  . Typical atrial flutter [facility])     Past Surgical History:  Procedure Laterality Date  . ATRIAL FIBRILLATION ABLATION N/A 03/27/2017   Procedure: ATRIAL FIBRILLATION ABLATION;   Surgeon: [name], MD;  Location: [place] [facility];  Service: Cardiovascular;  Laterality: N/A;  . LITHOTRIPSY    . VASECTOMY      Current Outpatient Medications  Medication Sig Dispense Refill  . acetaminophen (TYLENOL) 500 MG tablet Take 1,000 mg by mouth every 6 (six) hours as needed (for pain/headaches.).    Marland Kitchen apixaban (ELIQUIS) 5 MG TABS tablet Take 1 tablet (5 mg total) by mouth 2 (two) times daily. 180 tablet 1  . aspirin EC 81 MG tablet Take 81 mg by mouth daily.    Marland Kitchen atorvastatin (LIPITOR) 40 MG tablet TAKE 1 TABLET DAILY AT 6 PM(PLEASE KEEP UPCOMING      APPOINTMENT FOR FUTURE     REFILLS) 90 tablet 2  . fluticasone (FLONASE) 50 MCG/ACT nasal spray Place 1 spray into both nostrils at bedtime.     Marland Kitchen levothyroxine (SYNTHROID, LEVOTHROID) 25 MCG tablet Take 25 mcg by mouth daily before breakfast.     . nortriptyline (PAMELOR) 10 MG capsule Take 1 capsule (10 mg total) by mouth at bedtime. 30 capsule 2  . polyethylene glycol (MIRALAX / GLYCOLAX) packet Take 17 g by mouth daily.    . pregabalin (LYRICA) 200 MG capsule TAKE ONE CAPSULE BY MOUTH THREE TIMES A DAY 270 capsule 1  . Probiotic Product (PROBIOTIC PO) Take 1 tablet by mouth at bedtime.    . senna (SENOKOT) 8.6 MG tablet Take 2 tablets by mouth 2 (two) times daily. Bedtime     . traMADol (ULTRAM) 50 MG tablet TAKE 2 TABLETS  4 TIMES     DAILY 720 tablet 1   No current facility-administered medications for this visit.     Allergies:   Patient has no known allergies.   Social History:  The patient  reports that he has never smoked. He has never used smokeless tobacco. He reports current alcohol use of about 1.0 standard drinks of alcohol per week. He reports that he does not use drugs.   Family History:  The patient's family history includes Cancer in his mother; Heart failure in his father.   ROS:  Please see the history of present illness.   All other systems are personally reviewed and negative.    Exam:    Vital  Signs:  BP 118/78   Pulse 80   Ht 6\' 1"  (1.854 m)   Wt 196 lb (88.9 kg)   BMI 25.86 kg/m   Well sounding and appearing, alert and conversant, regular work of breathing,  good skin color Eyes- anicteric, neuro- grossly intact, skin- no apparent rash or lesions or cyanosis, mouth- oral mucosa is pink  Labs/Other Tests and Data Reviewed:    Recent Labs: 08/26/2018: BUN 11; Creatinine, Ser 0.90; Potassium 4.4; Sodium 140   Wt Readings from Last 3 Encounters:  11/20/18 196 lb (88.9 kg)  10/07/18 192 lb (87.1 kg)  07/17/18 190 lb (86.2 kg)      ASSESSMENT & PLAN:    1.  Paroxysmal atrail fibrillation/ atrial flutter Well controlled post ablation off AAD therapy On eliquis for chads2vasc score of 3.   Follow-up:  12 months with me   Patient Risk:  after full review of this patients clinical status, I feel that they are at moderate risk at this time.  Today, I have spent 15 minutes with the patient with telehealth technology discussing arrhythmia management .    Army Fossa, MD  11/20/2018 10:29 AM     [address] [place] [id number] [phone] (office) [phone] (fax)

## 2018-11-27 ENCOUNTER — Other Ambulatory Visit: Payer: Self-pay

## 2018-11-28 ENCOUNTER — Other Ambulatory Visit: Payer: Self-pay

## 2018-11-28 ENCOUNTER — Telehealth (INDEPENDENT_AMBULATORY_CARE_PROVIDER_SITE_OTHER): Payer: Medicare HMO | Admitting: Cardiology

## 2018-11-28 VITALS — Ht 73.5 in | Wt 195.0 lb

## 2018-11-28 DIAGNOSIS — I77810 Thoracic aortic ectasia: Secondary | ICD-10-CM | POA: Diagnosis not present

## 2018-11-28 DIAGNOSIS — E785 Hyperlipidemia, unspecified: Secondary | ICD-10-CM | POA: Diagnosis not present

## 2018-11-28 DIAGNOSIS — I480 Paroxysmal atrial fibrillation: Secondary | ICD-10-CM

## 2018-11-28 DIAGNOSIS — I6523 Occlusion and stenosis of bilateral carotid arteries: Secondary | ICD-10-CM

## 2018-11-28 MED ORDER — APIXABAN 5 MG PO TABS: 5.0000 mg | ORAL_TABLET | Freq: Two times a day (BID) | ORAL | 3 refills | Status: DC

## 2018-11-28 NOTE — Patient Instructions (Signed)
Medication Instructions:  Your provider recommends that you continue on your current medications as directed. Please refer to the Current Medication list given to you today.   Your Eliquis has been refilled.  Testing/Procedures: Your provider has requested that you have an echocardiogram in July, 2021. Echocardiography is a painless test that uses sound waves to create images of your heart. It provides your doctor with information about the size and shape of your heart and how well your heart's chambers and valves are working. This procedure takes approximately one hour. There are no restrictions for this procedure.    Follow-Up: Your provider wants you to follow-up in: 1 year with Dr. Radford Pax. You will receive a reminder letter in the mail two months in advance. If you don't receive a letter, please call our office to schedule the follow-up appointment.

## 2018-11-28 NOTE — Progress Notes (Signed)
Virtual Visit via Video Note   This visit type was conducted due to national recommendations for restrictions regarding the COVID-19 Pandemic (e.g. social distancing) in an effort to limit this patient's exposure and mitigate transmission in our community.  Due to his co-morbid illnesses, this patient is at least at moderate risk for complications without adequate follow up.  This format is felt to be most appropriate for this patient at this time.  All issues noted in this document were discussed and addressed.  A limited physical exam was performed with this format.  Please refer to the patient's chart for his consent to telehealth for [facility].  Evaluation Performed:  Follow-up visit  This visit type was conducted due to national recommendations for restrictions regarding the COVID-19 Pandemic (e.g. social distancing).  This format is felt to be most appropriate for this patient at this time.  All issues noted in this document were discussed and addressed.  No physical exam was performed (except for noted visual exam findings with Video Visits).  Please refer to the patient's chart (MyChart message for video visits and phone note for telephone visits) for the patient's consent to telehealth for [facility].  Date:  11/28/2018   ID:  [name], DOB 1956-01-20, MRN [id number]  Patient Location:  Home  Provider location:   [facility] Simmonds  PCP:  Hulan Fess, MD  Cardiologist:  Fransico Him, MD  Electrophysiologist:  [name], MD   Chief Complaint:  Atrial fibrillation and dilated aortic roo  History of Present Illness:    [name] is a 63 y.o. male who presents via audio/video conferencing for a telehealth visit today.    Matthew Blankenburg Buescheris a 63 y.o.malewith a history of PAF s/p afib ablation 02/2017 with CHADS2VASC score 4 (CVA, age>65, vascular dz) on Apixaban.He also has a history of dilated aortic rootby echo which alsoshowed normal LVF.  He also  had carotid artery stenosis with 60-70% right ICA stenosis and occluded Left ICA with prior CVA  S/p Right CEA at [facility] and hyperlipidemia.    He is here today for followup and is doing well.  He denies any chest pain or pressure, SOB, DOE, PND, orthopnea, LE edema, dizziness, palpitations or syncope. He is compliant with his meds and is tolerating meds with no SE.   The patient does not have symptoms concerning for COVID-19 infection (fever, chills, cough, or new shortness of breath).    Prior CV studies:   The following studies were reviewed today:  none  Past Medical History:  Diagnosis Date  . Altered sensation due to stroke 12/10/2014  . 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 Basal cell carcinoma   . Bradycardia 10/21/2015  . Contusion of calf, left, initial encounter 12/03/2015  . 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 Dilated aortic root ([place]) 10/20/2015  . Hyperlipidemia LDL goal <70 05/31/2016  . ICAO (internal carotid artery occlusion), left 07/10/2016   chronically occluded left ICA and s/p right CEA at [facility]  . 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 Nephrolithiasis   . Neuropathy   . PAF (paroxysmal atrial fibrillation) ([place]) 07/10/2016   s/p afib ablation 02/2017  . Typical atrial flutter [facility])    Past Surgical History:  Procedure Laterality Date  . ATRIAL FIBRILLATION ABLATION N/A 03/27/2017   Procedure: ATRIAL FIBRILLATION ABLATION;  Surgeon: [name], MD;  Location: [place] CV LAB;  Service: Cardiovascular;  Laterality: N/A;  . LITHOTRIPSY    . VASECTOMY       Current Meds  Medication Sig  . acetaminophen (TYLENOL) 500 MG tablet Take 1,000 mg by mouth every 6 (six) hours as needed (for pain/headaches.).  Marland Kitchen apixaban (ELIQUIS) 5 MG TABS tablet Take 1 tablet (5 mg total) by mouth 2 (two) times daily.  Marland Kitchen aspirin EC 81 MG tablet Take 81 mg by mouth daily.  Marland Kitchen atorvastatin (LIPITOR) 40 MG tablet TAKE 1  TABLET DAILY AT 6 PM(PLEASE KEEP UPCOMING      APPOINTMENT FOR FUTURE     REFILLS)  . fluticasone (FLONASE) 50 MCG/ACT nasal spray Place 1 spray into both nostrils at bedtime.   Marland Kitchen levothyroxine (SYNTHROID, LEVOTHROID) 25 MCG tablet Take 25 mcg by mouth daily before breakfast.   . nortriptyline (PAMELOR) 10 MG capsule Take 1 capsule (10 mg total) by mouth at bedtime.  . polyethylene glycol (MIRALAX / GLYCOLAX) packet Take 17 g by mouth daily.  . pregabalin (LYRICA) 200 MG capsule TAKE ONE CAPSULE BY MOUTH THREE TIMES A DAY  . Probiotic Product (PROBIOTIC PO) Take 1 tablet by mouth at bedtime.  . senna (SENOKOT) 8.6 MG tablet Take 2 tablets by mouth 2 (two) times daily. Bedtime   . traMADol (ULTRAM) 50 MG tablet TAKE 2 TABLETS 4 TIMES     DAILY     Allergies:   Patient has no known allergies.   Social History   Tobacco Use  . Smoking status: Never Smoker  . Smokeless tobacco: Never Used  Substance Use Topics  . Alcohol use: Yes    Alcohol/week: 1.0 standard drinks    Types: 1 Cans of beer per week    Comment: occasionally maybe the weekends  . Drug use: No     Family Hx: The patient's family history includes Cancer in his mother; Heart failure in his father.  ROS:   Please see the history of present illness.     All other systems reviewed and are negative.   Labs/Other Tests and Data Reviewed:    Recent Labs: 08/26/2018: BUN 11; Creatinine, Ser 0.90; Potassium 4.4; Sodium 140   Recent Lipid Panel Lab Results  Component Value Date/Time   CHOL 108 06/22/2016 09:52 AM   TRIG 75 06/22/2016 09:52 AM   HDL 29 (L) 06/22/2016 09:52 AM   CHOLHDL 3.7 06/22/2016 09:52 AM   CHOLHDL 5.0 09/25/2014 04:00 AM   LDLCALC 64 06/22/2016 09:52 AM    Wt Readings from Last 3 Encounters:  11/28/18 195 lb (88.5 kg)  11/20/18 196 lb (88.9 kg)  10/07/18 192 lb (87.1 kg)     Objective:    Vital Signs:  Ht 6' 1.5" (1.867 m)   Wt 195 lb (88.5 kg)   BMI 25.38 kg/m    CONSTITUTIONAL:   Well nourished, well developed male in no acute distress.  EYES: anicteric MOUTH: oral mucosa is pink RESPIRATORY: Normal respiratory effort, symmetric expansion CARDIOVASCULAR: No peripheral edema SKIN: No rash, lesions or ulcers MUSCULOSKELETAL: no digital cyanosis NEURO: Cranial Nerves II-XII grossly intact, moves all extremities PSYCH: Intact judgement and insight.  A&O x 3, Mood/affect appropriate   ASSESSMENT & PLAN:    1.  PAF -s/p afib ablation 02/2017 -he denies any palpitations and thinks he is in NSR -Denies any bleeding on the DOAC -continue Apixaban 5mg  BID -creatinine was normal at 0.9 in 07/2018  2.  Carotid artery stenosis - chronically occluded left ICA stenosis and 60-79% right carotid stenosis.  He is s/p right CEA at [facility] -this is followed by Neurosurgery at [facility] -continue ASA and statin  3.  HLD -LDL goal < 70 -I will get a copy of FLP and ALT from PCP -continue atorvastatin 40mg  daily  4.  Dilated aortic root -Chest CT 2020 showed 3.6cm ascending aorta and 3.8cm at STJ -continue with good BP control -continue statin -to avoid excess XRT will get 2D echo in 1 year to followup on STJ aortic measurements  COVID-19 Education: The signs and symptoms of COVID-19 were discussed with the patient and how to seek care for testing (follow up with PCP or arrange E-visit).  The importance of social distancing was discussed today.  Patient Risk:   After full review of this patient's clinical status, I feel that they are at least moderate risk at this time.  Time:   Today, I have spent 20 minutes directly with the patient on telemedicine discussing medical problems including PF, HLD, carotid dz.  We also reviewed the symptoms of COVID 19 and the ways to protect against contracting the virus with telehealth technology.  I spent an additional 5 minutes reviewing patient's chart including labs, Chest CT.  Medication Adjustments/Labs and Tests Ordered: Current medicines  are reviewed at length with the patient today.  Concerns regarding medicines are outlined above.  Tests Ordered: No orders of the defined types were placed in this encounter.  Medication Changes: No orders of the defined types were placed in this encounter.   Disposition:  Follow up in 1 year(s)  Signed, Fransico Him, MD  11/28/2018 9:33 AM    [facility]

## 2018-12-05 DIAGNOSIS — R7301 Impaired fasting glucose: Secondary | ICD-10-CM | POA: Diagnosis not present

## 2018-12-05 DIAGNOSIS — E786 Lipoprotein deficiency: Secondary | ICD-10-CM | POA: Diagnosis not present

## 2018-12-05 DIAGNOSIS — I779 Disorder of arteries and arterioles, unspecified: Secondary | ICD-10-CM | POA: Diagnosis not present

## 2018-12-05 DIAGNOSIS — Z8673 Personal history of transient ischemic attack (TIA), and cerebral infarction without residual deficits: Secondary | ICD-10-CM | POA: Diagnosis not present

## 2018-12-05 DIAGNOSIS — I4891 Unspecified atrial fibrillation: Secondary | ICD-10-CM | POA: Diagnosis not present

## 2018-12-05 DIAGNOSIS — Z23 Encounter for immunization: Secondary | ICD-10-CM | POA: Diagnosis not present

## 2018-12-05 DIAGNOSIS — Z1159 Encounter for screening for other viral diseases: Secondary | ICD-10-CM | POA: Diagnosis not present

## 2018-12-05 DIAGNOSIS — G8929 Other chronic pain: Secondary | ICD-10-CM | POA: Diagnosis not present

## 2018-12-05 DIAGNOSIS — Z79899 Other long term (current) drug therapy: Secondary | ICD-10-CM | POA: Diagnosis not present

## 2018-12-05 DIAGNOSIS — Z0000 Encounter for general adult medical examination without abnormal findings: Secondary | ICD-10-CM | POA: Diagnosis not present

## 2018-12-05 DIAGNOSIS — E039 Hypothyroidism, unspecified: Secondary | ICD-10-CM | POA: Diagnosis not present

## 2018-12-23 DIAGNOSIS — I639 Cerebral infarction, unspecified: Secondary | ICD-10-CM | POA: Diagnosis not present

## 2018-12-23 DIAGNOSIS — E039 Hypothyroidism, unspecified: Secondary | ICD-10-CM | POA: Diagnosis not present

## 2018-12-23 DIAGNOSIS — I4891 Unspecified atrial fibrillation: Secondary | ICD-10-CM | POA: Diagnosis not present

## 2019-01-02 ENCOUNTER — Other Ambulatory Visit: Payer: Self-pay | Admitting: Registered Nurse

## 2019-01-06 ENCOUNTER — Ambulatory Visit: Payer: Medicare HMO | Admitting: Physical Medicine & Rehabilitation

## 2019-01-09 ENCOUNTER — Other Ambulatory Visit: Payer: Self-pay

## 2019-01-09 ENCOUNTER — Encounter: Payer: Self-pay | Admitting: Physical Medicine & Rehabilitation

## 2019-01-09 ENCOUNTER — Encounter: Payer: Medicare HMO | Attending: Physical Medicine & Rehabilitation | Admitting: Physical Medicine & Rehabilitation

## 2019-01-09 VITALS — BP 133/75 | HR 68 | Temp 97.9°F | Ht 73.5 in | Wt 197.0 lb

## 2019-01-09 DIAGNOSIS — G890 Central pain syndrome: Secondary | ICD-10-CM | POA: Insufficient documentation

## 2019-01-09 MED ORDER — NORTRIPTYLINE HCL 10 MG PO CAPS: 20.0000 mg | ORAL_CAPSULE | Freq: Every day | ORAL | 2 refills | Status: DC

## 2019-01-09 NOTE — Progress Notes (Signed)
Subjective:    Patient ID: [name], male    DOB: January 24, 1956, 63 y.o.   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  He has a history of Dejerine Roussy syndrome.  His pain is limited to his right lower extremity.  He has had work-up for lumbar spinal stenosis which was negative.   sleeps ok at night   Takes Miralax and senna to combat the constipating effects of the tramadol   Pain Inventory Average Pain 7 Pain Right Now 7 My pain is constant, dull and aching  In the last 24 hours, has pain interfered with the following? General activity 5 Relation with others 5 Enjoyment of life 5 What TIME of day is your pain at its worst? all Sleep (in general) Good  Pain is worse with: unsure Pain improves with: heat/ice Relief from Meds: na  Mobility walk without assistance ability to climb steps?  yes do you drive?  no  Function disabled: date disabled .  Neuro/Psych numbness spasms  Prior Studies Any changes since last visit?  no  Physicians involved in your care Any changes since last visit?  no   Family History  Problem Relation Age of Onset  . Cancer Mother   . Heart failure Father    Social History   Socioeconomic History  . Marital status: Married    Spouse name: Not on file  . Number of children: Not on file  . Years of education: Not on file  . Highest education level: Not on file  Occupational History  . Not on file  Social Needs  . Financial resource strain: Not on file  . Food insecurity    Worry: Not on file    Inability: Not on file  . Transportation needs    Medical: Not on file    Non-medical: Not on file  Tobacco Use  . Smoking status: Never Smoker  . Smokeless tobacco: Never Used  Substance and Sexual Activity  . Alcohol use: Yes    Alcohol/week: 1.0 standard drinks    Types: 1 Cans of beer per week    Comment: occasionally maybe the weekends  . Drug use: No  . Sexual activity:  Not on file  Lifestyle  . Physical activity    Days per week: Not on file    Minutes per session: Not on file  . Stress: Not on file  Relationships  . Social Herbalist on phone: Not on file    Gets together: Not on file    Attends religious service: Not on file    Active member of club or organization: Not on file    Attends meetings of clubs or organizations: Not on file    Relationship status: Not on file  Other Topics Concern  . Not on file  Social History Narrative  . Not on file   Past Surgical History:  Procedure Laterality Date  . ATRIAL FIBRILLATION ABLATION N/A 03/27/2017   Procedure: ATRIAL FIBRILLATION ABLATION;  Surgeon: [name], MD;  Location: [place] CV LAB;  Service: Cardiovascular;  Laterality: N/A;  . LITHOTRIPSY    . VASECTOMY     Past Medical History:  Diagnosis Date  . Altered sensation due to stroke 12/10/2014  . 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 Basal cell carcinoma   . Bradycardia 10/21/2015  . Contusion of calf, left, initial encounter 12/03/2015  . 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 Dilated aortic root ([place]) 10/20/2015  . Hyperlipidemia LDL goal <70 05/31/2016  . ICAO (internal carotid artery occlusion), left 07/10/2016   chronically occluded left ICA and s/p right CEA at [facility]  . 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 Nephrolithiasis   . Neuropathy   . PAF (paroxysmal atrial fibrillation) ([place]) 07/10/2016   s/p afib ablation 02/2017  . Typical atrial flutter (HCC)    There were no vitals taken for this visit.  Opioid Risk Score:   Fall Risk Score:  `1  Depression screen PHQ 2/9  Depression screen [facility] 2/9 12/29/2016 06/04/2015 10/23/2014  Decreased Interest 0 0 0  Down, Depressed, Hopeless 0 0 0  PHQ - 2 Score 0 0 0  Altered sleeping - - 3  Tired, decreased energy - - 3  Change in appetite - - 0  Feeling bad or failure about yourself  - - 0  Trouble  concentrating - - 0  Moving slowly or fidgety/restless - - 0  Suicidal thoughts - - 0  PHQ-9 Score - - 6  Difficult doing work/chores - - Not difficult at all    Review of Systems  Constitutional: Negative.   HENT: Negative.   Eyes: Negative.   Respiratory: Negative.   Cardiovascular: Negative.   Gastrointestinal: Negative.   Endocrine: Negative.   Genitourinary: Negative.   Musculoskeletal: Positive for myalgias.  Skin: Negative.   Allergic/Immunologic: Negative.   Neurological: Positive for numbness.  Hematological: Negative.   Psychiatric/Behavioral: Negative.   All other systems reviewed and are negative.      Objective:   Physical Exam Vitals signs and nursing note reviewed.  Constitutional:      Appearance: Normal appearance.  Eyes:     Extraocular Movements: Extraocular movements intact.     Conjunctiva/sclera: Conjunctivae normal.     Pupils: Pupils are equal, round, and reactive to light.  Neurological:     Mental Status: He is alert and oriented to person, place, and time.  Psychiatric:        Mood and Affect: Mood normal.        Behavior: Behavior normal.     Absent sensation to light touch and pinprick in the right lower limb Diminished in the right upper limb but does have ability to identify which finger is touched with either light touch or pinprick. Sensory testing is somewhat limited because of his receptive and expressive aphasia      Assessment & Plan:  #1.  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  The patient has trialed Nucynta which caused nausea and was not tolerated. Patient gets partial relief with tramadol 100 mg 4 times daily as well as pregabalin 200 mg 3 times daily Nortriptyline 10 mg nightly was added and this has provided some additional relief.  Will increase to 20 mg but would not go up beyond this given potential interaction with tramadol in terms of serotonin syndrome.  Thus far has not shown any  sign of this

## 2019-01-10 DIAGNOSIS — J019 Acute sinusitis, unspecified: Secondary | ICD-10-CM | POA: Diagnosis not present

## 2019-02-01 ENCOUNTER — Other Ambulatory Visit: Payer: Self-pay | Admitting: Cardiology

## 2019-02-19 DIAGNOSIS — J019 Acute sinusitis, unspecified: Secondary | ICD-10-CM | POA: Diagnosis not present

## 2019-03-06 DIAGNOSIS — J309 Allergic rhinitis, unspecified: Secondary | ICD-10-CM | POA: Diagnosis not present

## 2019-03-24 DIAGNOSIS — E039 Hypothyroidism, unspecified: Secondary | ICD-10-CM | POA: Diagnosis not present

## 2019-03-24 DIAGNOSIS — I4891 Unspecified atrial fibrillation: Secondary | ICD-10-CM | POA: Diagnosis not present

## 2019-03-24 DIAGNOSIS — I639 Cerebral infarction, unspecified: Secondary | ICD-10-CM | POA: Diagnosis not present

## 2019-04-01 ENCOUNTER — Ambulatory Visit: Payer: Medicare HMO

## 2019-04-01 DIAGNOSIS — H6123 Impacted cerumen, bilateral: Secondary | ICD-10-CM | POA: Diagnosis not present

## 2019-04-06 ENCOUNTER — Ambulatory Visit: Payer: Medicare HMO

## 2019-04-10 ENCOUNTER — Encounter: Payer: Self-pay | Admitting: Physical Medicine & Rehabilitation

## 2019-04-10 ENCOUNTER — Other Ambulatory Visit: Payer: Self-pay

## 2019-04-10 ENCOUNTER — Encounter: Payer: Medicare HMO | Attending: Physical Medicine & Rehabilitation | Admitting: Physical Medicine & Rehabilitation

## 2019-04-10 VITALS — BP 124/80 | HR 76 | Temp 97.5°F | Ht 74.0 in | Wt 201.0 lb

## 2019-04-10 DIAGNOSIS — G890 Central pain syndrome: Secondary | ICD-10-CM | POA: Diagnosis not present

## 2019-04-10 MED ORDER — PREGABALIN 200 MG PO CAPS: 200.0000 mg | ORAL_CAPSULE | Freq: Three times a day (TID) | ORAL | 1 refills | Status: DC

## 2019-04-10 MED ORDER — NORTRIPTYLINE HCL 10 MG PO CAPS: 20.0000 mg | ORAL_CAPSULE | Freq: Every day | ORAL | 5 refills | Status: DC

## 2019-04-10 MED ORDER — TRAMADOL HCL 50 MG PO TABS: ORAL_TABLET | ORAL | 1 refills | Status: DC

## 2019-04-10 NOTE — Progress Notes (Signed)
Subjective:    Patient ID: [name], male    DOB: 1955/10/24, 64 y.o.   MRN: [id number]  HPI 64 year old male with history of large left MCA distribution infarct in 2016.  He completed inpatient rehabilitation.  He had good improvement of his right hemiparesis.  His aphasia persisted.  He also had severe dysesthetic pain in the right lower extremity.  MRI in 2016 of the lumbar spine demonstrated no compressive lesions.  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  This has been helpful in managing his pain which otherwise made it difficult for him to perform his ADLs. Pain Inventory Average Pain 7 Pain Right Now 7 My pain is constant and aching  In the last 24 hours, has pain interfered with the following? General activity 7 Relation with others 6 Enjoyment of life 6 What TIME of day is your pain at its worst? morning Sleep (in general) Good  Pain is worse with: walking, bending, sitting and standing Pain improves with: heat/ice and medication Relief from Meds: 5  Mobility walk without assistance ability to climb steps?  yes do you drive?  no  Function retired  Neuro/Psych numbness spasms  Prior Studies Any changes since last visit?  no  Physicians involved in your care Any changes since last visit?  no   Family History  Problem Relation Age of Onset  . Cancer Mother   . Heart failure Father    Social History   Socioeconomic History  . Marital status: Married    Spouse name: Not on file  . Number of children: Not on file  . Years of education: Not on file  . Highest education level: Not on file  Occupational History  . Not on file  Tobacco Use  . Smoking status: Never Smoker  . Smokeless tobacco: Never Used  Substance and Sexual Activity  . Alcohol use: Yes    Alcohol/week: 1.0 standard drinks    Types: 1 Cans of beer per week    Comment: occasionally  maybe the weekends  . Drug use: No  . Sexual activity: Not on file  Other Topics Concern  . Not on file  Social History Narrative  . Not on file   Social Determinants of Health   Financial Resource Strain:   . Difficulty of Paying Living Expenses: Not on file  Food Insecurity:   . Worried About Charity fundraiser in the Last Year: Not on file  . Ran Out of Food in the Last Year: Not on file  Transportation Needs:   . Lack of Transportation (Medical): Not on file  . Lack of Transportation (Non-Medical): Not on file  Physical Activity:   . Days of Exercise per Week: Not on file  . Minutes of Exercise per Session: Not on file  Stress:   . Feeling of Stress : Not on file  Social Connections:   . Frequency of Communication with Friends and Family: Not on file  . Frequency of Social Gatherings with Friends and Family: Not on file  . Attends Religious Services: Not on file  . Active Member of Clubs or Organizations: Not on file  . Attends Archivist Meetings: Not on file  . Marital Status: Not on file   Past Surgical History:  Procedure Laterality Date  . ATRIAL FIBRILLATION ABLATION N/A 03/27/2017   Procedure: ATRIAL FIBRILLATION ABLATION;  Surgeon: [name], MD;  Location: [place] CV LAB;  Service: Cardiovascular;  Laterality: N/A;  . LITHOTRIPSY    . VASECTOMY     Past Medical History:  Diagnosis Date  . Altered sensation due to stroke 12/10/2014  . 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 Basal cell carcinoma   . Bradycardia 10/21/2015  . Contusion of calf, left, initial encounter 12/03/2015  . 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 Dilated aortic root ([place]) 10/20/2015  . Hyperlipidemia LDL goal <70 05/31/2016  . ICAO (internal carotid artery occlusion), left 07/10/2016   chronically occluded left ICA and s/p right CEA at [facility]  . 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 Nephrolithiasis   .  Neuropathy   . PAF (paroxysmal atrial fibrillation) ([place]) 07/10/2016   s/p afib ablation 02/2017  . Typical atrial flutter (HCC)    BP 124/80   Pulse 76   Temp (!) 97.5 F (36.4 C)   Ht 6\' 2"  (1.88 m)   Wt 201 lb (91.2 kg)   SpO2 95%   BMI 25.81 kg/m   Opioid Risk Score:   Fall Risk Score:  `1  Depression screen PHQ 2/9  Depression screen [facility] 2/9 12/29/2016 06/04/2015 10/23/2014  Decreased Interest 0 0 0  Down, Depressed, Hopeless 0 0 0  PHQ - 2 Score 0 0 0  Altered sleeping - - 3  Tired, decreased energy - - 3  Change in appetite - - 0  Feeling bad or failure about yourself  - - 0  Trouble concentrating - - 0  Moving slowly or fidgety/restless - - 0  Suicidal thoughts - - 0  PHQ-9 Score - - 6  Difficult doing work/chores - - Not difficult at all    Review of Systems  Constitutional: Negative.  Negative for fever.  HENT: Negative.   Eyes: Negative.   Respiratory: Negative.   Cardiovascular: Negative.   Gastrointestinal: Positive for constipation.  Musculoskeletal:       Spasms  Skin: Negative.   Allergic/Immunologic: Negative.   Neurological: Positive for numbness.  Hematological: Negative.   Psychiatric/Behavioral: Negative.   All other systems reviewed and are negative.      Objective:   Physical Exam Vitals and nursing note reviewed.  Constitutional:      Appearance: Normal appearance.  HENT:     Head: Normocephalic and atraumatic.  Eyes:     Extraocular Movements: Extraocular movements intact.     Conjunctiva/sclera: Conjunctivae normal.     Pupils: Pupils are equal, round, and reactive to light.  Neurological:     Mental Status: He is alert.     Comments: Motor strength is 5/5 in the left deltoid bicep tricep grip hip flexor knee extensor ankle dorsiflexors 5 -/5 in the right deltoid by stress of grip hip flexion extensor ankle dorsiflexor he does have difficulty following commands and requires gestural cues for his manual muscle testing.  Similarly  his sensory exam is difficult to interpret secondary his naming problems and his difficulty in understanding what is being asked of him. Gait is without evidence of toe drag or knee instability does not require assistive device He does not have any pain with light brushing of his lower extremity.  He does point to his right anterior thigh as the primary area where he experiences pain as well his right lateral leg  Psychiatric:        Mood and Affect: Mood normal.  Assessment & Plan:  #1.  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  He will continue on his current medications including tramadol 100 mg 4 times daily Lyrica 200 mg 3 times daily Nortriptyline 20 mg nightly PDMP reviewed Urine toxicology at next visit

## 2019-04-10 NOTE — Patient Instructions (Signed)
Stay active.

## 2019-04-15 DIAGNOSIS — R69 Illness, unspecified: Secondary | ICD-10-CM | POA: Diagnosis not present

## 2019-04-22 DIAGNOSIS — H40013 Open angle with borderline findings, low risk, bilateral: Secondary | ICD-10-CM | POA: Diagnosis not present

## 2019-04-22 DIAGNOSIS — H53461 Homonymous bilateral field defects, right side: Secondary | ICD-10-CM | POA: Diagnosis not present

## 2019-04-22 DIAGNOSIS — H2513 Age-related nuclear cataract, bilateral: Secondary | ICD-10-CM | POA: Diagnosis not present

## 2019-04-22 DIAGNOSIS — H25013 Cortical age-related cataract, bilateral: Secondary | ICD-10-CM | POA: Diagnosis not present

## 2019-04-29 IMAGING — CT CT HEART MORPH/PULM VEIN W/ CM & W/O CA SCORE
2 of 6 series · 10 of 20 positions shown, 12 images · IV contrast (isovue)
Comparison: None.
COMPARISON: None

EXAM:
OVER-READ INTERPRETATION  CT CHEST

The following report is an over-read performed by radiologist Dr.
Ig Danna [[email or web address]] on 03/20/2017. This over-read
does not include interpretation of cardiac or coronary anatomy or
pathology. The coronary CTA interpretation by the cardiologist is
attached.
CLINICAL DATA: Pre Ablation
Cardiac Gated CTA
TECHNIQUE: The patient was scanned on a Siemens Force [REDACTED]ice scanner. Gantry
rotation speed was 250 msec with a temporal resolution of 66 msec. A
prospective scan was triggered in the ascending thoracic aorta at
140 HU's Data sets were reconstructed with full mA between 35% and
75% of the R-R interval Images were reviewed using VRT, MIP and MPR
modes. Double oblique images were used to measure the PV diameter
and areas. The patient received 80 cc of contrast at 5 cc/sec
CONTRAST:  Isovue 370 total 80 cc

[Series 7: best diast · axial · 0.36mm/px · z∈[+1055,+1156]mm · 5 of 379 slices shown, 7 images]
[im 64/379  vessel]
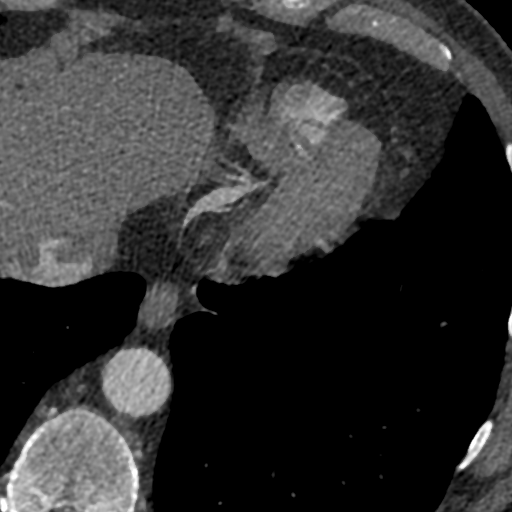
[im 64/379  lung]
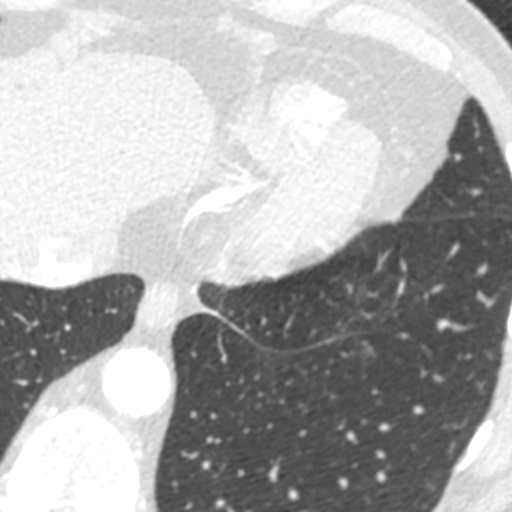
[im 127/379  vessel]
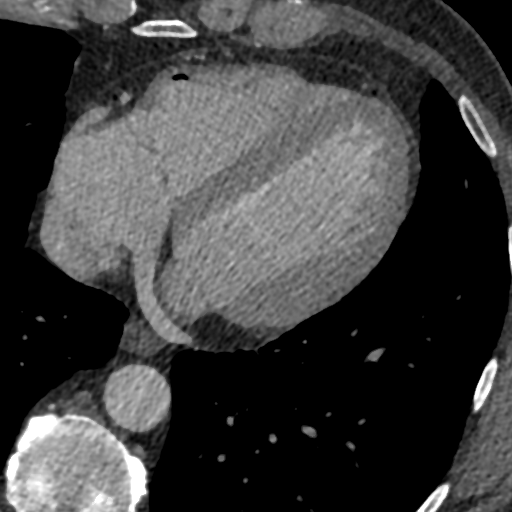
[im 190/379  vessel]
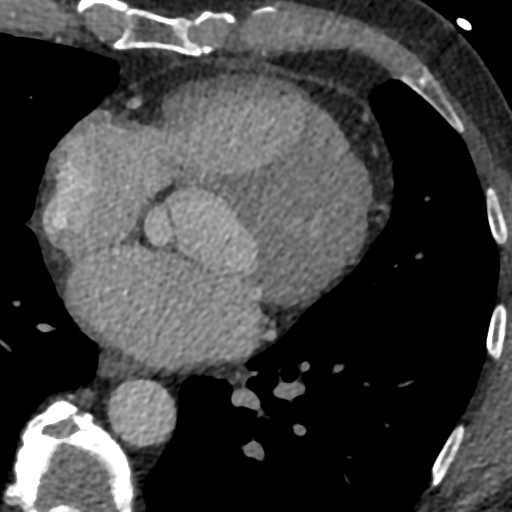
[im 253/379  vessel]
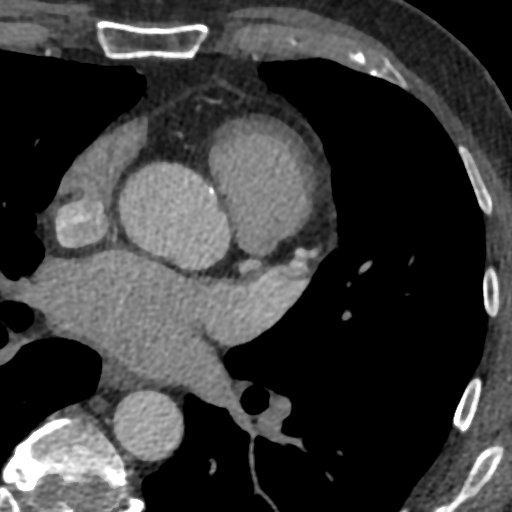
[im 316/379  vessel]
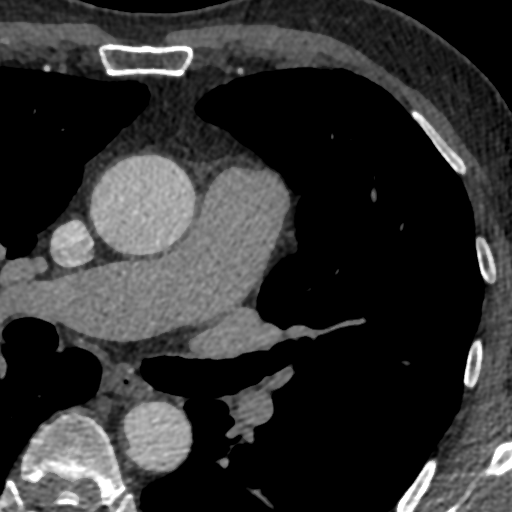
[im 316/379  lung]
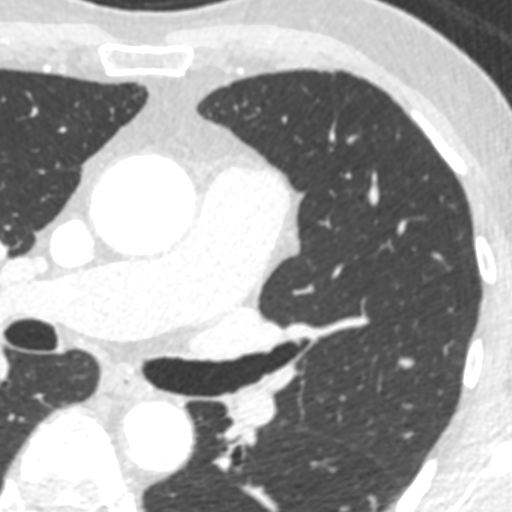

[Series 8: +300 ms · axial · 0.36mm/px · z∈[+1055,+1156]mm · 5 of 379 slices shown]
[im 64/379  vessel]
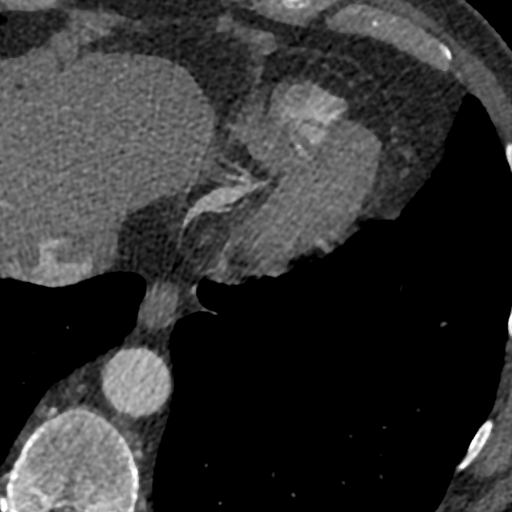
[im 127/379  vessel]
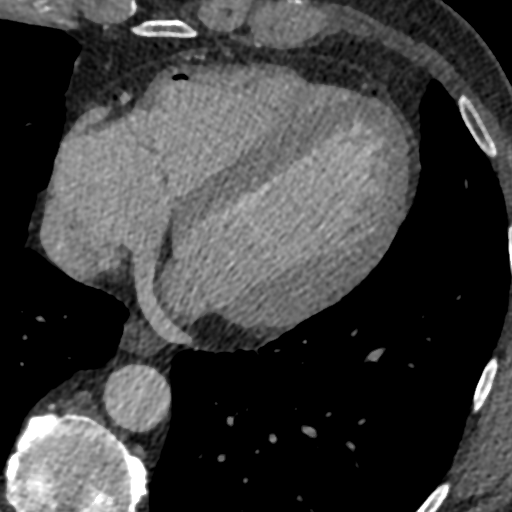
[im 190/379  vessel]
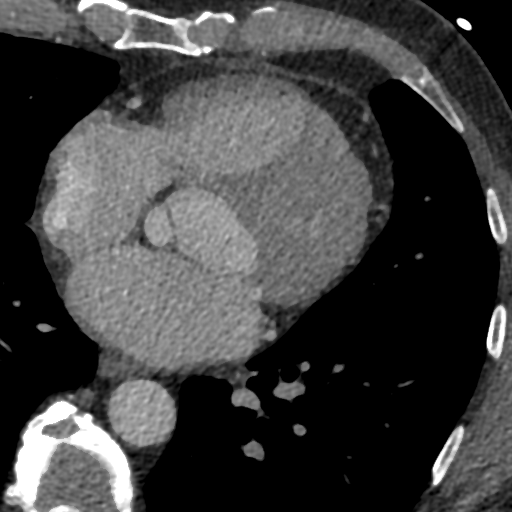
[im 253/379  vessel]
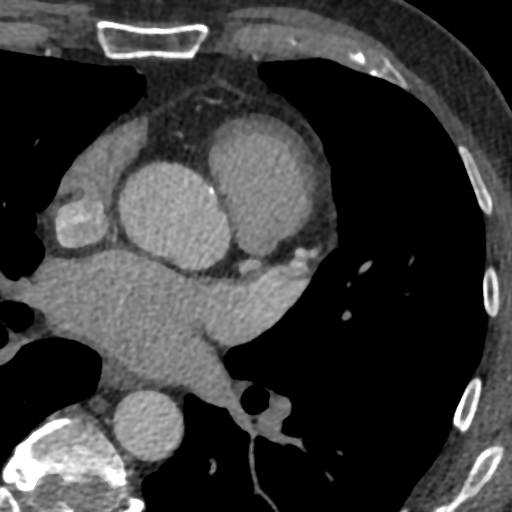
[im 316/379  vessel]
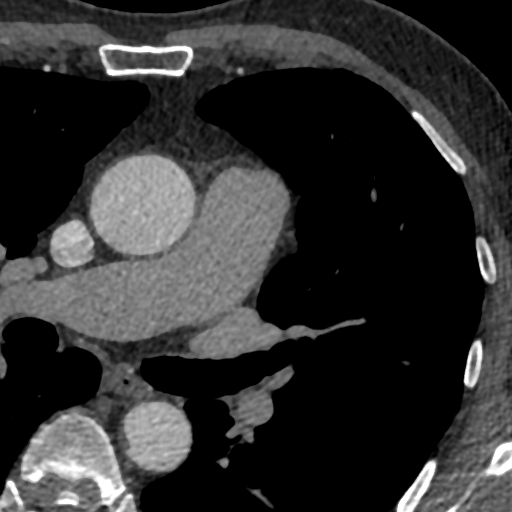

[10 of 20 positions shown; findings below may reference images not displayed]

FINDINGS: Heart is normal size. Aorta is normal caliber. No adenopathy in the
lower mediastinum or hila. Visualized lungs clear. No effusions.

Imaging into the upper abdomen shows no acute findings. Chest wall
soft tissues are unremarkable. No acute bony abnormality.
IMPRESSION: No acute or significant extracardiac abnormality.
FINDINGS: There was mild bi atrial enlargement. There was no pericardial
effusion. There was mild aortic root enlargement 3.8 cm. There was
no LAYANNE thrombus. The esophagus courses closest to the LLPV ostium
The PV;s drained normally into the LA with no anomaly

Coronary calcium score 0

LUPV:  Ostium 18.4 mm    area 2.6 cm2

LLPV:   Ostium 16.6 mm  area 2.7 cm2

RUPV:  Ostium 16.3 mm   area 1.9   Cm2

RLPV:  Ostium 18.9 mm  area 2.75 cm2
IMPRESSION: 1.  No LAYANNE thrombus mild bi atrial enlargement

2.  Normal PV anatomy see dimensions above

3.  Mild aortic root enlargement 3.8 cm

4.  Esophagus courses closest to the LLPV ostium

5.  No pericardial effusion

6.  Calcium score 0

## 2019-05-01 DIAGNOSIS — I6523 Occlusion and stenosis of bilateral carotid arteries: Secondary | ICD-10-CM | POA: Diagnosis not present

## 2019-05-01 DIAGNOSIS — R9389 Abnormal findings on diagnostic imaging of other specified body structures: Secondary | ICD-10-CM | POA: Diagnosis not present

## 2019-05-01 DIAGNOSIS — I6521 Occlusion and stenosis of right carotid artery: Secondary | ICD-10-CM | POA: Diagnosis not present

## 2019-05-01 DIAGNOSIS — Z9889 Other specified postprocedural states: Secondary | ICD-10-CM | POA: Diagnosis not present

## 2019-05-01 DIAGNOSIS — I69320 Aphasia following cerebral infarction: Secondary | ICD-10-CM | POA: Diagnosis not present

## 2019-05-07 ENCOUNTER — Telehealth: Payer: Self-pay

## 2019-05-07 NOTE — Telephone Encounter (Signed)
[facility] called stating patient is wanting 90 day supply on Nortriptyline. There is refills on the original prescription and they can combine into a 90 day supply if that is okay?

## 2019-05-08 NOTE — Telephone Encounter (Signed)
Informed Kristopher Oppenheim okay for 90 day supply.

## 2019-05-08 NOTE — Telephone Encounter (Signed)
[place] for 90d supply nortriptyline

## 2019-05-19 DIAGNOSIS — I4891 Unspecified atrial fibrillation: Secondary | ICD-10-CM | POA: Diagnosis not present

## 2019-05-19 DIAGNOSIS — I639 Cerebral infarction, unspecified: Secondary | ICD-10-CM | POA: Diagnosis not present

## 2019-05-19 DIAGNOSIS — E039 Hypothyroidism, unspecified: Secondary | ICD-10-CM | POA: Diagnosis not present

## 2019-07-09 DIAGNOSIS — L237 Allergic contact dermatitis due to plants, except food: Secondary | ICD-10-CM | POA: Diagnosis not present

## 2019-07-24 DIAGNOSIS — I639 Cerebral infarction, unspecified: Secondary | ICD-10-CM | POA: Diagnosis not present

## 2019-07-24 DIAGNOSIS — E039 Hypothyroidism, unspecified: Secondary | ICD-10-CM | POA: Diagnosis not present

## 2019-07-24 DIAGNOSIS — I4891 Unspecified atrial fibrillation: Secondary | ICD-10-CM | POA: Diagnosis not present

## 2019-08-13 DIAGNOSIS — N281 Cyst of kidney, acquired: Secondary | ICD-10-CM | POA: Diagnosis not present

## 2019-08-13 DIAGNOSIS — Q6102 Congenital multiple renal cysts: Secondary | ICD-10-CM | POA: Diagnosis not present

## 2019-08-13 DIAGNOSIS — N200 Calculus of kidney: Secondary | ICD-10-CM | POA: Diagnosis not present

## 2019-08-13 DIAGNOSIS — Q631 Lobulated, fused and horseshoe kidney: Secondary | ICD-10-CM | POA: Diagnosis not present

## 2019-08-14 DIAGNOSIS — H663X1 Other chronic suppurative otitis media, right ear: Secondary | ICD-10-CM | POA: Diagnosis not present

## 2019-08-14 DIAGNOSIS — H7292 Unspecified perforation of tympanic membrane, left ear: Secondary | ICD-10-CM | POA: Diagnosis not present

## 2019-08-14 DIAGNOSIS — H6122 Impacted cerumen, left ear: Secondary | ICD-10-CM | POA: Diagnosis not present

## 2019-08-19 DIAGNOSIS — I639 Cerebral infarction, unspecified: Secondary | ICD-10-CM | POA: Diagnosis not present

## 2019-08-19 DIAGNOSIS — I4891 Unspecified atrial fibrillation: Secondary | ICD-10-CM | POA: Diagnosis not present

## 2019-08-19 DIAGNOSIS — E039 Hypothyroidism, unspecified: Secondary | ICD-10-CM | POA: Diagnosis not present

## 2019-09-17 ENCOUNTER — Encounter (HOSPITAL_COMMUNITY): Payer: Self-pay | Admitting: Emergency Medicine

## 2019-09-17 ENCOUNTER — Emergency Department (HOSPITAL_COMMUNITY)
Admission: EM | Admit: 2019-09-17 | Discharge: 2019-09-17 | Disposition: A | Discharge status: Home / Self Care | Payer: Medicare HMO | Attending: Emergency Medicine | Admitting: Emergency Medicine

## 2019-09-17 ENCOUNTER — Other Ambulatory Visit: Payer: Self-pay

## 2019-09-17 ENCOUNTER — Emergency Department (HOSPITAL_COMMUNITY): Payer: Medicare HMO

## 2019-09-17 DIAGNOSIS — R001 Bradycardia, unspecified: Secondary | ICD-10-CM | POA: Diagnosis not present

## 2019-09-17 DIAGNOSIS — Z7982 Long term (current) use of aspirin: Secondary | ICD-10-CM | POA: Insufficient documentation

## 2019-09-17 DIAGNOSIS — R002 Palpitations: Secondary | ICD-10-CM | POA: Insufficient documentation

## 2019-09-17 DIAGNOSIS — R079 Chest pain, unspecified: Secondary | ICD-10-CM | POA: Diagnosis not present

## 2019-09-17 LAB — TROPONIN I (HIGH SENSITIVITY)
Troponin I (High Sensitivity): 3 ng/L (ref <18)
Troponin I (High Sensitivity): 3 ng/L (ref <18)

## 2019-09-17 LAB — BASIC METABOLIC PANEL
Anion gap: 5 (ref 5–15)
BUN: 11 mg/dL (ref 8–23)
CO2: 31 mmol/L (ref 22–32)
Calcium: 9.4 mg/dL (ref 8.9–10.3)
Chloride: 105 mmol/L (ref 98–111)
Creatinine, Ser: 0.94 mg/dL (ref 0.61–1.24)
GFR calc Af Amer: >60 mL/min (ref >60)
GFR calc non Af Amer: >60 mL/min (ref >60)
Glucose, Bld: 96 mg/dL (ref 70–99)
Potassium: 4.8 mmol/L (ref 3.5–5.1)
Sodium: 141 mmol/L (ref 135–145)

## 2019-09-17 LAB — CBC
HCT: 44.6 % (ref 39.0–52.0)
Hemoglobin: 14.6 g/dL (ref 13.0–17.0)
MCH: 31.3 pg (ref 26.0–34.0)
MCHC: 32.7 g/dL (ref 30.0–36.0)
MCV: 95.5 fL (ref 80.0–100.0)
Platelets: 230 10*3/uL (ref 150–400)
RBC: 4.67 MIL/uL (ref 4.22–5.81)
RDW: 12.7 % (ref 11.5–15.5)
WBC: 4.4 10*3/uL (ref 4.0–10.5)
nRBC: 0 % (ref 0.0–0.2)

## 2019-09-17 MED ORDER — SODIUM CHLORIDE 0.9% FLUSH: 3.0000 mL | Freq: Once | INTRAVENOUS | Status: DC

## 2019-09-17 NOTE — ED Provider Notes (Signed)
[facility] EMERGENCY DEPARTMENT Provider Note   CSN: [id number] Arrival date & time: 09/17/19  1134     History Chief Complaint  Patient presents with   Palpitations    [name] is a 64 y.o. male with past medical history of CVA, paroxysmal A. fib status post ablation in 2019 on Eliquis, presenting to the emergency department with complaint of palpitations it began around 4 AM this morning.  Patient states the symptom is hard to describe however it feels similar to a fluttering sensation in his chest.  The symptoms resolved upon arriving to the ED.  He denies any associated chest pain, shortness of breath, fatigue or generalized weakness, or other associated symptoms.  He states he was feeling just fine when he was having the palpitations, he has no current complaints on evaluation.  He recently had ablation in 2019 for his A. fib and has not had recurrence since.  His symptoms today feel similar to atrial fibrillation.  The history is provided by the patient.       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   Nephrolithiasis    Neuropathy    PAF (paroxysmal atrial fibrillation) ([place]) 07/10/2016   s/p afib ablation 02/2017   Typical atrial flutter [facility])     Patient Active Problem List   Diagnosis Date Noted   Impacted cerumen of both ears 04/26/2017   Perennial allergic rhinitis 04/26/2017   Paroxysmal atrial fibrillation  ([place]) 03/27/2017   Mild mitral regurgitation 01/25/2017   Carotid disease, bilateral ([place]) 08/08/2016   PAF (paroxysmal atrial fibrillation) ([place]) 07/10/2016   ICAO (internal carotid artery occlusion), left 07/10/2016   Hyperlipidemia LDL goal <70 05/31/2016   Carotid artery stenosis    Nephrolithiasis 02/08/2016   Abdominal pain, chronic, right lower quadrant 12/03/2015   Contusion of calf, left, initial encounter 12/03/2015   Bradycardia 10/21/2015   Dilated aortic root ([place]) 10/20/2015   Cerebrovascular accident (CVA) due to embolism of left middle cerebral artery ([place]) 03/12/2015   Altered sensation due to stroke 12/10/2014   Stenosis of right carotid artery 12/10/2014   Occlusion of left internal carotid artery 12/10/2014   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   Embolism of left middle cerebral artery 09/29/2014   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   Acute ischemic left MCA stroke ([place])    ICAO (internal carotid artery occlusion)    Carotid stenosis    CVA (cerebral infarction) 09/25/2014   Stroke ([place]) 09/25/2014   Persistent atrial fibrillation ([place]) 09/25/2014   SOB (shortness of breath) 05/19/2014   Heart palpitations 05/19/2014    Past Surgical History:  Procedure Laterality Date   ATRIAL FIBRILLATION ABLATION N/A 03/27/2017   Procedure: ATRIAL FIBRILLATION ABLATION;  Surgeon: [name], MD;  Location: [facility];  Service: Cardiovascular;  Laterality: N/A;   LITHOTRIPSY     VASECTOMY  Family History  Problem Relation Age of Onset   Cancer Mother    Heart failure Father     Social History   Tobacco Use   Smoking status: Never Smoker   Smokeless tobacco: Never Used  Vaping Use   Vaping Use: Never used  Substance Use Topics   Alcohol use: Yes    Alcohol/week: 1.0 standard drink    Types: 1 Cans of beer per week    Comment: occasionally maybe the  weekends   Drug use: No    Home Medications Prior to Admission medications   Medication Sig Start Date End Date Taking? Authorizing Provider  acetaminophen (TYLENOL) 500 MG tablet Take 1,000 mg by mouth every 6 (six) hours as needed (for pain/headaches.).    [provider]  apixaban (ELIQUIS) 5 MG TABS tablet Take 1 tablet (5 mg total) by mouth 2 (two) times daily. 11/28/18 11/23/19  Sueanne Margarita, MD  aspirin EC 81 MG tablet Take 81 mg by mouth daily.    [provider]  atorvastatin (LIPITOR) 40 MG tablet TAKE 1 TABLET DAILY AT 6 PM 02/04/19   Turner, Eber Hong, MD  fluticasone (FLONASE) 50 MCG/ACT nasal spray Place 1 spray into both nostrils at bedtime.     [provider]  levothyroxine (SYNTHROID, LEVOTHROID) 25 MCG tablet Take 25 mcg by mouth daily before breakfast.  05/04/15   [provider]  nortriptyline (PAMELOR) 10 MG capsule Take 2 capsules (20 mg total) by mouth at bedtime. 04/10/19   Kirsteins, Luanna Salk, MD  polyethylene glycol (MIRALAX / GLYCOLAX) packet Take 17 g by mouth daily.    [provider]  pregabalin (LYRICA) 200 MG capsule Take 1 capsule (200 mg total) by mouth 3 (three) times daily. 04/10/19   Kirsteins, Luanna Salk, MD  Probiotic Product (PROBIOTIC PO) Take 1 tablet by mouth at bedtime.    [provider]  senna (SENOKOT) 8.6 MG tablet Take 2 tablets by mouth 2 (two) times daily. Bedtime     [provider]  traMADol (ULTRAM) 50 MG tablet TAKE TWO TABLETS BY MOUTH FOUR TIMES A DAY 04/10/19   Kirsteins, Luanna Salk, MD    Allergies    Patient has no known allergies.  Review of Systems   Review of Systems  Cardiovascular: Positive for palpitations.  All other systems reviewed and are negative.   Physical Exam Updated Vital Signs BP (!) 135/99    Pulse 64    Temp 98.3 F (36.8 C) (Oral)    Resp 15    Ht 6\' 2"  (1.88 m)    Wt 93 kg    SpO2 100%    BMI 26.32 kg/m   Physical Exam Vitals and nursing note  reviewed.  Constitutional:      General: He is not in acute distress.    Appearance: He is well-developed. He is not ill-appearing.  HENT:     Head: Normocephalic and atraumatic.  Eyes:     Conjunctiva/sclera: Conjunctivae normal.  Cardiovascular:     Rate and Rhythm: Normal rate and regular rhythm.     Pulses: Normal pulses.  Pulmonary:     Effort: Pulmonary effort is normal. No respiratory distress.     Breath sounds: Normal breath sounds.  Abdominal:     General: Bowel sounds are normal.     Palpations: Abdomen is soft.     Tenderness: There is no abdominal tenderness.  Musculoskeletal:     Right lower leg: No edema.  Left lower leg: No edema.  Skin:    General: Skin is warm.  Neurological:     Mental Status: He is alert.  Psychiatric:        Behavior: Behavior normal.     ED Results / Procedures / Treatments   Labs (all labs ordered are listed, but only abnormal results are displayed) Labs Reviewed  BASIC METABOLIC PANEL  CBC  TROPONIN I (HIGH SENSITIVITY)  TROPONIN I (HIGH SENSITIVITY)    EKG EKG Interpretation  Date/Time:  Wednesday September 17 2019 11:59:08 EDT Ventricular Rate:  68 PR Interval:  168 QRS Duration: 92 QT Interval:  392 QTC Calculation: 416 R Axis:   83 Text Interpretation: Normal sinus rhythm Normal ECG Confirmed by Davonna Belling [phone]) on 09/17/2019 6:58:27 PM   Radiology DG Chest 2 View  Result Date: 09/17/2019 CLINICAL DATA:  Chest pain. Additional provided: Palpitations since last night. EXAM: CHEST - 2 VIEW COMPARISON:  CT angiogram chest 09/02/2018, prior chest radiographs 03/29/2017 and earlier FINDINGS: Heart size within normal limits. No appreciable airspace consolidation or pulmonary edema. No evidence of pleural effusion or pneumothorax. No acute bony abnormality identified. IMPRESSION: No evidence of active cardiopulmonary disease. Electronically Signed   By: Kellie Simmering DO   On: 09/17/2019 12:55     Procedures Procedures (including critical care time)  Medications Ordered in ED Medications  sodium chloride flush (NS) 0.9 % injection 3 mL (3 mLs Intravenous Not Given 09/17/19 1728)    ED Course  I have reviewed the triage vital signs and the nursing notes.  Pertinent labs & imaging results that were available during my care of the patient were reviewed by me and considered in my medical decision making (see chart for details).    MDM Rules/Calculators/A&P                         Patient with history of paroxysmal A. fib status post ablation in 2019, on Eliquis and compliant, presenting to the ED with palpitations that began at 4 AM this morning and resolved upon arriving to the ED.  He had no associated symptoms with his palpitations.  He is asymptomatic on evaluation.  ECG on arrival with normal sinus rhythm, no ischemic changes.  Troponins x2 are negative.  BMP and CBC are within normal limits.  Chest x-ray is negative.  As patient was asymptomatic with palpitations and remains in sinus rhythm in the ED, believe patient is appropriate for discharge with close outpatient follow-up.  Recommend he is call his cardiologist in the morning.  He may benefit from Holter monitor to determine if he is having paroxysmal A. fib once again.  Discussed importance of continuing Eliquis as directed.  Return precautions discussed.  Patient is agreeable to plan, safe for discharge.  Patient discussed with and evaluated by Dr. Alvino Chapel, agrees with work-up and care plan at this time.  Final Clinical Impression(s) / ED Diagnoses Final diagnoses:  Palpitations    Rx / DC Orders ED Discharge Orders    None       Cachet Mccutchen, [place] N, PA-C 09/17/19 Verner Mould, MD 09/17/19 2231

## 2019-09-17 NOTE — ED Triage Notes (Signed)
Patient arrives to ED with complaints of palpations and a "jittery" feeling in his chest since 4am this morning. Patient states the feeling is constant. Patient denies SOB and N/V/D.

## 2019-09-17 NOTE — Discharge Instructions (Addendum)
Please call your cardiologist in the morning to discuss your visit today. You may benefit from a Holter monitor in case you have recurrence of the palpitations. Please return to the emergency department if you begin having symptoms along with your palpitations including chest pain, shortness of breath, significant fatigue or generalized weakness, or other concerning symptoms.

## 2019-09-23 ENCOUNTER — Ambulatory Visit (HOSPITAL_COMMUNITY): Payer: Medicare HMO | Attending: Cardiology

## 2019-09-23 DIAGNOSIS — R001 Bradycardia, unspecified: Secondary | ICD-10-CM | POA: Diagnosis not present

## 2019-09-23 DIAGNOSIS — I480 Paroxysmal atrial fibrillation: Secondary | ICD-10-CM | POA: Diagnosis not present

## 2019-09-23 DIAGNOSIS — Z8673 Personal history of transient ischemic attack (TIA), and cerebral infarction without residual deficits: Secondary | ICD-10-CM | POA: Insufficient documentation

## 2019-09-23 DIAGNOSIS — I77810 Thoracic aortic ectasia: Secondary | ICD-10-CM | POA: Insufficient documentation

## 2019-09-23 LAB — ECHOCARDIOGRAM COMPLETE
Area-P 1/2: 4.86 cm2
S' Lateral: 2.3 cm

## 2019-09-24 ENCOUNTER — Encounter: Payer: Self-pay | Admitting: Cardiology

## 2019-09-25 ENCOUNTER — Ambulatory Visit: Payer: Medicare HMO | Admitting: Cardiology

## 2019-10-05 ENCOUNTER — Other Ambulatory Visit: Payer: Self-pay | Admitting: Physical Medicine & Rehabilitation

## 2019-10-06 NOTE — Telephone Encounter (Signed)
Mrs Schlotterbeck called back and they can wait until appt tomorrow to get refill,

## 2019-10-06 NOTE — Telephone Encounter (Signed)
Left VM for [name] to let us know if he is out of his tramadol or if he can wait to refill tomorrow at his appointment with [name].

## 2019-10-07 ENCOUNTER — Encounter: Payer: Medicare HMO | Attending: Physical Medicine & Rehabilitation | Admitting: Physical Medicine & Rehabilitation

## 2019-10-07 ENCOUNTER — Encounter: Payer: Medicare HMO | Admitting: Physical Medicine & Rehabilitation

## 2019-10-07 ENCOUNTER — Other Ambulatory Visit: Payer: Self-pay

## 2019-10-07 ENCOUNTER — Encounter: Payer: Self-pay | Admitting: Physical Medicine & Rehabilitation

## 2019-10-07 VITALS — BP 121/73 | HR 79 | Temp 98.7°F | Ht 74.0 in | Wt 202.4 lb

## 2019-10-07 DIAGNOSIS — G890 Central pain syndrome: Secondary | ICD-10-CM | POA: Insufficient documentation

## 2019-10-07 MED ORDER — TRAMADOL HCL 50 MG PO TABS: ORAL_TABLET | ORAL | 1 refills | Status: DC

## 2019-10-07 MED ORDER — PREGABALIN 200 MG PO CAPS: 200.0000 mg | ORAL_CAPSULE | Freq: Three times a day (TID) | ORAL | 1 refills | Status: DC

## 2019-10-07 NOTE — Progress Notes (Signed)
Subjective:    Patient ID: [name], male    DOB: Jul 11, 1955, 64 y.o.   MRN: [id number] 64 year old male with history of large left MCA distribution infarct in 2016.  He completed inpatient rehabilitation.  He had good improvement of his right hemiparesis.  His aphasia persisted.  He also had severe dysesthetic pain in the right lower extremity.  MRI in 2016 of the lumbar spine demonstrated no compressive lesions.  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  This has been helpful in managing his pain which otherwise made it difficult for him to perform his ADLs. HPI Patient without new issues since last visit.  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  He has had no side effects from his medications other than constipation Takes Miralax and senna for constipation, patient is pleased with results. Patient reduced nortriptyline to once a day at nighttime because of drowsiness Pain Inventory Average Pain 7 Pain Right Now 7 My pain is constant and aching  In the last 24 hours, has pain interfered with the following? General activity 7 Relation with others 7 Enjoyment of life 7 What TIME of day is your pain at its worst? morning Sleep (in general) Fair  Pain is worse with: walking, bending, sitting and standing Pain improves with: heat/ice Relief from Meds: 5  Mobility how many minutes can you walk? No problem walking. ability to climb steps?  yes do you drive?  no Do you have any goals in this area?  yes  Function retired I need assistance with the following:  No help needed. Do you have any goals in this area?  yes  Neuro/Psych numbness confusion  Prior Studies Any changes since last visit?  yes Echo  Physicians involved in your care Any changes since last visit?  no   Family History  Problem Relation Age of Onset   . Cancer Mother   . Heart failure Father    Social History   Socioeconomic History  . Marital status: Married    Spouse name: Not on file  . Number of children: Not on file  . Years of education: Not on file  . Highest education level: Not on file  Occupational History  . Not on file  Tobacco Use  . Smoking status: Never Smoker  . Smokeless tobacco: Never Used  Vaping Use  . Vaping Use: Never used  Substance and Sexual Activity  . Alcohol use: Yes    Alcohol/week: 1.0 standard drink    Types: 1 Cans of beer per week    Comment: occasionally maybe the weekends  . Drug use: No  . Sexual activity: Not on file  Other Topics Concern  . Not on file  Social History Narrative  . Not on file   Social Determinants of Health   Financial Resource Strain:   . Difficulty of Paying Living Expenses:   Food Insecurity:   . Worried About Charity fundraiser in the Last Year:   . Arboriculturist in the Last Year:   Transportation Needs:   . Film/video editor (Medical):   Marland Kitchen Lack of Transportation (Non-Medical):   Physical Activity:   . Days of Exercise per Week:   . Minutes of Exercise per Session:   Stress:   . Feeling of Stress :   Social Connections:   .  Frequency of Communication with Friends and Family:   . Frequency of Social Gatherings with Friends and Family:   . Attends Religious Services:   . Active Member of Clubs or Organizations:   . Attends Archivist Meetings:   Marland Kitchen Marital Status:    Past Surgical History:  Procedure Laterality Date  . ATRIAL FIBRILLATION ABLATION N/A 03/27/2017   Procedure: ATRIAL FIBRILLATION ABLATION;  Surgeon: [name], MD;  Location: [facility];  Service: Cardiovascular;  Laterality: N/A;  . LITHOTRIPSY    . VASECTOMY     Past Medical History:  Diagnosis Date  . Altered sensation due to stroke 12/10/2014  . 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   38mm by echo 08/2019    . Basal cell carcinoma   . Bradycardia 10/21/2015  . Contusion of calf, left, initial encounter 12/03/2015  . 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 Hyperlipidemia LDL goal <70 05/31/2016  . ICAO (internal carotid artery occlusion), left 07/10/2016   chronically occluded left ICA and s/p right CEA at [facility]  . 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 Nephrolithiasis   . Neuropathy   . PAF (paroxysmal atrial fibrillation) ([place]) 07/10/2016   s/p afib ablation 02/2017  . Typical atrial flutter ([facility])    BP 121/73   Pulse 79   Temp 98.7 F (37.1 C)   Ht 6\' 2"  (1.88 m)   Wt 202 lb 6.4 oz (91.8 kg)   SpO2 96%   BMI 25.99 kg/m   Opioid Risk Score:   Fall Risk Score:  `1  Depression screen PHQ 2/9  Depression screen [facility] 2/9 12/29/2016 06/04/2015 10/23/2014  Decreased Interest 0 0 0  Down, Depressed, Hopeless 0 0 0  PHQ - 2 Score 0 0 0  Altered sleeping - - 3  Tired, decreased energy - - 3  Change in appetite - - 0  Feeling bad or failure about yourself  - - 0  Trouble concentrating - - 0  Moving slowly or fidgety/restless - - 0  Suicidal thoughts - - 0  PHQ-9 Score - - 6  Difficult doing work/chores - - Not difficult at all   Review of Systems  Constitutional: Negative.   HENT: Negative.   Respiratory: Negative.   Cardiovascular: Negative.   Gastrointestinal: Negative.   Endocrine: Negative.   Genitourinary: Negative.   Musculoskeletal: Negative.        RIGHT LEG NUMBNESS  Skin: Negative.   Allergic/Immunologic: Negative.   Neurological: Positive for numbness.       RIGHT LEG  Hematological: Negative.   Psychiatric/Behavioral: Negative.        Cognitive   All other systems reviewed and are negative.      Objective:   Physical Exam Vitals and nursing note reviewed.  Constitutional:      Appearance: Normal appearance.  HENT:     Head: Normocephalic and atraumatic.  Eyes:     Extraocular Movements:  Extraocular movements intact.     Conjunctiva/sclera: Conjunctivae normal.     Pupils: Pupils are equal, round, and reactive to light.  Musculoskeletal:        General: No swelling or tenderness.  Skin:    General: Skin is warm and dry.  Neurological:     General: No focal deficit present.     Mental Status: He is alert and oriented to person, place, and time.  Cranial Nerves: No cranial nerve deficit, dysarthria or facial asymmetry.     Sensory: Sensory deficit present.     Motor: No weakness.     Gait: Gait is intact.     Comments: No hypersensitivity to light touch in the right lower limb There is mild sensitivity with palpation of the right thigh perhaps a millimeter  Psychiatric:        Mood and Affect: Mood normal.        Behavior: Behavior normal.           Assessment & Plan:   1.  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 Continue Lyrica 200 mg 3 times daily Continue tramadol 100 mg 4 times daily

## 2019-10-13 ENCOUNTER — Other Ambulatory Visit: Payer: Self-pay

## 2019-10-13 ENCOUNTER — Encounter: Payer: Self-pay | Admitting: Physician Assistant

## 2019-10-13 ENCOUNTER — Ambulatory Visit: Payer: Medicare HMO | Admitting: Physician Assistant

## 2019-10-13 VITALS — BP 100/50 | HR 76 | Ht 74.0 in | Wt 204.0 lb

## 2019-10-13 DIAGNOSIS — E785 Hyperlipidemia, unspecified: Secondary | ICD-10-CM

## 2019-10-13 DIAGNOSIS — I480 Paroxysmal atrial fibrillation: Secondary | ICD-10-CM | POA: Diagnosis not present

## 2019-10-13 DIAGNOSIS — I77810 Thoracic aortic ectasia: Secondary | ICD-10-CM | POA: Diagnosis not present

## 2019-10-13 DIAGNOSIS — I6523 Occlusion and stenosis of bilateral carotid arteries: Secondary | ICD-10-CM

## 2019-10-13 MED ORDER — PROPRANOLOL HCL 10 MG PO TABS: 10.0000 mg | ORAL_TABLET | Freq: Every day | ORAL | 3 refills | Status: DC | PRN

## 2019-10-13 NOTE — Patient Instructions (Addendum)
   Medication Instructions:  Your physician has recommended you make the following change in your medication:   1) Start Propranolol 10 mg, 1 tablet by mouth once a day as needed for palpitations  *If you need a refill on your cardiac medications before your next appointment, please call your pharmacy*  Lab Work: None ordered today  Testing/Procedures: Non ordered today   Follow-Up: Keep follow up with [name], MD on 11/10/19  Other Instructions:  To monitor you heart rhythm at home: You can look into the Apple Watch or  The [employer] device by [employer]. This device is purchased by you and it connects to an application you download to your smart phone.  It can detect abnormal heart rhythms and alert you to contact your doctor for further evaluation. The web site is:  https://www.alivecor.com

## 2019-10-13 NOTE — Progress Notes (Signed)
Cardiology Office Note:    Date:  10/13/2019   ID:  JACOBY ZANNI, DOB Apr 01, 1955, MRN [id number]  PCP:  Hulan Fess, MD  Cardiologist:  Fransico Him, MD   Electrophysiologist:  [name], MD   Referring MD: Hulan Fess, MD   Chief Complaint:  Hospitalization Follow-up (ED visit for palpitations)    Patient Profile:    [name] is a 64 y.o. male with:   Paroxysmal atrial fibrillation   S/p PVI Ablation in 02/2017  CHA2DS2-VASc=4 (CVA, age x 1, vascular dz) >>Apixaban    Dilated aortic root, ascending aorta  Echo 7/21: Ascending aorta 39 mm  S/p CVA   Carotid artery disease  S/p R CEA ([facility])  Chronically occluded L ICA   Followed by vasc surgery at [facility]  Hyperlipidemia   Prior CV studies: Echocardiogram 09/23/2019 EF 55-60, normal wall motion, normal diastolic function, normal RVSF, ascending aorta 39 mm  Chest CTA 09/02/2018 Ascending thoracic aorta 3.6 x 3.6 cm  Cardiac CT 03/20/2017 IMPRESSION: 1.  No LAA thrombus mild bi atrial enlargement 2.  Normal PV anatomy see dimensions above 3.  Mild aortic root enlargement 3.8 cm 4.  Esophagus courses closest to the LLPV ostium 5.  No pericardial effusion 6.  Calcium score 0  Carotid US 01/14/2016 R 60-79; L occluded  Myoview 05/29/2014 EF 63, no ischemia  History of Present Illness:    [name] was last seen by Dr. Radford Pax in October 2020 via telemedicine.  Prior to that, he had seen Dr. Rayann Glass in September 2020 via telemedicine.     He was seen in the ED 09/17/19 for palpitations.  He was noted to be in normal sinus rhythm and was DC home.  He returns for Cardiology follow up.   Labs in ED (personally reviewed and interpreted):  K 4.8, SCr 0.94, Hg 14.6, hs-Trop 3>>3 CXR in ED: no acute dz EKG in the ED (personally reviewed): NSR, HR 68, normal axis, no acute ST changes, QTC 416  He is here today with his wife.  He has not had further palpitations since he went to the ED.  The  episode he had lasted about 6 hours.  He has not had chest pain, significant shortness of breath, orthopnea, leg swelling, syncope.    Past Medical History:  Diagnosis Date  . Altered sensation due to stroke 12/10/2014  . 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   45mm by echo 08/2019  . Basal cell carcinoma   . Bradycardia 10/21/2015  . Contusion of calf, left, initial encounter 12/03/2015  . 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 Hyperlipidemia LDL goal <70 05/31/2016  . ICAO (internal carotid artery occlusion), left 07/10/2016   chronically occluded left ICA and s/p right CEA at [facility]  . 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 Nephrolithiasis   . Neuropathy   . PAF (paroxysmal atrial fibrillation) ([place]) 07/10/2016   s/p afib ablation 02/2017  . Typical atrial flutter (HCC)     Current Medications: Current Meds  Medication Sig  . acetaminophen (TYLENOL) 500 MG tablet Take 1,000 mg by mouth every 6 (six) hours as needed (for pain/headaches.).  Marland Kitchen apixaban (ELIQUIS) 5 MG TABS tablet Take 1 tablet (5 mg total) by mouth 2 (two) times daily.  Marland Kitchen aspirin EC 81 MG tablet Take 81 mg by mouth daily.  Marland Kitchen atorvastatin (LIPITOR) 40 MG tablet TAKE  1 TABLET DAILY AT 6 PM  . fluticasone (FLONASE) 50 MCG/ACT nasal spray Place 1 spray into both nostrils at bedtime.   Marland Kitchen levothyroxine (SYNTHROID, LEVOTHROID) 25 MCG tablet Take 25 mcg by mouth daily before breakfast.   . nortriptyline (PAMELOR) 10 MG capsule Take 10 mg by mouth at bedtime.  . polyethylene glycol (MIRALAX / GLYCOLAX) packet Take 17 g by mouth daily.  . pregabalin (LYRICA) 200 MG capsule Take 1 capsule (200 mg total) by mouth 3 (three) times daily.  . Probiotic Product (PROBIOTIC PO) Take 1 tablet by mouth at bedtime.  . senna (SENOKOT) 8.6 MG tablet Take 2 tablets by mouth 2 (two) times daily. Bedtime   . traMADol (ULTRAM) 50 MG tablet  TAKE TWO TABLETS BY MOUTH FOUR TIMES A DAY     Allergies:   Patient has no known allergies.   Social History   Tobacco Use  . Smoking status: Never Smoker  . Smokeless tobacco: Never Used  Vaping Use  . Vaping Use: Never used  Substance Use Topics  . Alcohol use: Yes    Alcohol/week: 1.0 standard drink    Types: 1 Cans of beer per week    Comment: occasionally maybe the weekends  . Drug use: No     Family Hx: The patient's family history includes Cancer in his mother; Heart failure in his father.  Review of Systems  Gastrointestinal: Negative for hematochezia and melena.  Genitourinary: Negative for hematuria.     EKGs/Labs/Other Test Reviewed:    EKG:  EKG is not ordered today.  The ekg ordered today demonstrates n/a  Recent Labs: 09/17/2019: BUN 11; Creatinine, Ser 0.94; Hemoglobin 14.6; Platelets 230; Potassium 4.8; Sodium 141   Recent Lipid Panel Lab Results  Component Value Date/Time   CHOL 108 06/22/2016 09:52 AM   TRIG 75 06/22/2016 09:52 AM   HDL 29 (L) 06/22/2016 09:52 AM   CHOLHDL 3.7 06/22/2016 09:52 AM   CHOLHDL 5.0 09/25/2014 04:00 AM   LDLCALC 64 06/22/2016 09:52 AM    Physical Exam:    VS:  BP (!) 100/50   Pulse 76   Ht 6\' 2"  (1.88 m)   Wt 204 lb (92.5 kg)   SpO2 96%   BMI 26.19 kg/m     Wt Readings from Last 3 Encounters:  10/13/19 204 lb (92.5 kg)  10/07/19 202 lb 6.4 oz (91.8 kg)  09/17/19 205 lb (93 kg)     Constitutional:      Appearance: Healthy appearance. Not in distress.  Neck:     Thyroid: No thyromegaly.     Vascular: JVD normal.  Pulmonary:     Effort: Pulmonary effort is normal.     Breath sounds: No wheezing. No rales.  Cardiovascular:     Normal rate. Regular rhythm. Normal S1. Normal S2.     Murmurs: There is no murmur.  Edema:    Peripheral edema absent.  Abdominal:     Palpations: Abdomen is soft.  Skin:    General: Skin is warm and dry.  Neurological:     General: No focal deficit present.     Mental  Status: Alert and oriented to person, place and time.     Cranial Nerves: Cranial nerves are intact.       ASSESSMENT & PLAN:    1. Paroxysmal atrial fibrillation (HCC) S/p prior ablation. He was recently seen in the emergency room with palpitations. This was similar to his previous atrial fibrillation. He was in sinus rhythm  when he got to the emergency room. He has not had a recurrence. At this point, I am not certain that an event monitor would be useful. If he has recurrent symptoms in the near future, we should certainly arrange an event monitor. He has an appointment next month with Dr. Rayann Glass. He should keep that appointment. I will give him a prescription for propranolol 10 mg to take once daily as needed for palpitations. We talked about the apple watch versus the Kardia device.    2. Hyperlipidemia LDL goal <70 Continue atorvastatin.  3. Dilated aortic root (HCC) Echocardiogram July 2021 demonstrated 39 mm ascending aorta.  4. Bilateral carotid artery stenosis Followed by vascular surgery at [facility]:  Return in 4 weeks (on 11/10/2019) for Scheduled Follow Up w/ Dr. Rayann Glass.   Medication Adjustments/Labs and Tests Ordered: Current medicines are reviewed at length with the patient today.  Concerns regarding medicines are outlined above.  Tests Ordered: No orders of the defined types were placed in this encounter.  Medication Changes: Meds ordered this encounter  Medications  . propranolol (INDERAL) 10 MG tablet    Sig: Take 1 tablet (10 mg total) by mouth daily as needed (palpitations).    Dispense:  30 tablet    Refill:  3    Signed, Richardson Dopp, PA-C  10/13/2019 4:09 PM    [facility] [address], [place], [place]  [address] Phone: [phone]; Fax: [phone]

## 2019-10-14 DIAGNOSIS — E039 Hypothyroidism, unspecified: Secondary | ICD-10-CM | POA: Diagnosis not present

## 2019-10-14 DIAGNOSIS — I4891 Unspecified atrial fibrillation: Secondary | ICD-10-CM | POA: Diagnosis not present

## 2019-10-21 ENCOUNTER — Ambulatory Visit: Payer: Medicare HMO | Admitting: Physician Assistant

## 2019-10-21 DIAGNOSIS — R69 Illness, unspecified: Secondary | ICD-10-CM | POA: Diagnosis not present

## 2019-11-10 ENCOUNTER — Other Ambulatory Visit: Payer: Self-pay

## 2019-11-10 ENCOUNTER — Ambulatory Visit: Payer: Medicare HMO | Admitting: Internal Medicine

## 2019-11-10 ENCOUNTER — Encounter: Payer: Self-pay | Admitting: Internal Medicine

## 2019-11-10 VITALS — BP 142/74 | HR 69 | Ht 74.0 in | Wt 204.0 lb

## 2019-11-10 DIAGNOSIS — I4892 Unspecified atrial flutter: Secondary | ICD-10-CM | POA: Diagnosis not present

## 2019-11-10 DIAGNOSIS — D6869 Other thrombophilia: Secondary | ICD-10-CM

## 2019-11-10 DIAGNOSIS — E785 Hyperlipidemia, unspecified: Secondary | ICD-10-CM

## 2019-11-10 DIAGNOSIS — I480 Paroxysmal atrial fibrillation: Secondary | ICD-10-CM | POA: Diagnosis not present

## 2019-11-10 NOTE — Progress Notes (Signed)
PCP: Hulan Fess, MD Primary Cardiologist: Dr Radford Pax Primary EP: Dr Rayann Heman  [name] is a 64 y.o. male who presents today for routine electrophysiology followup.  Since last being seen in our clinic, the patient reports doing very well.  Today, he denies symptoms of palpitations, chest pain, shortness of breath,  lower extremity edema, dizziness, presyncope, or syncope.  [name].   Past Medical History:  Diagnosis Date  . Altered sensation due to stroke 12/10/2014  . 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   18mm by echo 08/2019  . Basal cell carcinoma   . Bradycardia 10/21/2015  . Contusion of calf, left, initial encounter 12/03/2015  . 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 Hyperlipidemia LDL goal <70 05/31/2016  . ICAO (internal carotid artery occlusion), left 07/10/2016   chronically occluded left ICA and s/p right CEA at [facility]  . 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 Nephrolithiasis   . Neuropathy   . PAF (paroxysmal atrial fibrillation) ([place]) 07/10/2016   s/p afib ablation 02/2017  . Typical atrial flutter [facility])    Past Surgical History:  Procedure Laterality Date  . ATRIAL FIBRILLATION ABLATION N/A 03/27/2017   Procedure: ATRIAL FIBRILLATION ABLATION;  Surgeon: [name], MD;  Location: [facility];  Service: Cardiovascular;  Laterality: N/A;  . LITHOTRIPSY    . VASECTOMY      ROS- all systems are reviewed and negatives except as per HPI above  Current Outpatient Medications  Medication Sig Dispense Refill  . acetaminophen (TYLENOL) 500 MG tablet Take 1,000 mg by mouth every 6 (six) hours as needed (for pain/headaches.).    Marland Kitchen apixaban (ELIQUIS) 5 MG TABS tablet Take 1 tablet (5 mg total) by mouth 2 (two) times daily. 180 tablet 3  . aspirin EC 81 MG tablet Take 81 mg by mouth daily.    Marland Kitchen atorvastatin  (LIPITOR) 40 MG tablet TAKE 1 TABLET DAILY AT 6 PM 90 tablet 3  . fluticasone (FLONASE) 50 MCG/ACT nasal spray Place 1 spray into both nostrils at bedtime.     Marland Kitchen levothyroxine (SYNTHROID, LEVOTHROID) 25 MCG tablet Take 25 mcg by mouth daily before breakfast.     . nortriptyline (PAMELOR) 10 MG capsule Take 10 mg by mouth at bedtime.    . polyethylene glycol (MIRALAX / GLYCOLAX) packet Take 17 g by mouth daily.    . pregabalin (LYRICA) 200 MG capsule Take 1 capsule (200 mg total) by mouth 3 (three) times daily. 270 capsule 1  . Probiotic Product (PROBIOTIC PO) Take 1 tablet by mouth at bedtime.    . propranolol (INDERAL) 10 MG tablet Take 1 tablet (10 mg total) by mouth daily as needed (palpitations). 30 tablet 3  . senna (SENOKOT) 8.6 MG tablet Take 2 tablets by mouth 2 (two) times daily. Bedtime     . traMADol (ULTRAM) 50 MG tablet TAKE TWO TABLETS BY MOUTH FOUR TIMES A DAY 720 tablet 1   No current facility-administered medications for this visit.    Physical Exam: Vitals:   11/10/19 1200  BP: (!) 142/74  Pulse: 69  SpO2: 95%  Weight: 204 lb (92.5 kg)  Height: 6\' 2"  (1.88 m)    GEN- The patient is well appearing, alert and oriented x 3 today.   Head- normocephalic, atraumatic Eyes-  Sclera clear, conjunctiva pink Ears- hearing intact Oropharynx- clear  Lungs- Clear to ausculation bilaterally, normal work of breathing Heart- Regular rate and rhythm, no murmurs, rubs or gallops, PMI not laterally displaced GI- soft, NT, ND, + BS Extremities- no clubbing, cyanosis, or edema  Wt Readings from Last 3 Encounters:  11/10/19 204 lb (92.5 kg)  10/13/19 204 lb (92.5 kg)  10/07/19 202 lb 6.4 oz (91.8 kg)    EKG tracing ordered today is personally reviewed and shows sinus rhythm   Assessment and Plan:  1. Paroxysmal atrial fibrillation/ atrial flutter Well controlled post ablation off AAD therapy chads2vasc score is 4.  He is on eliquis Stop ASA  2. HL Continue statin  3.  Dilated aortic root/ carotid artery stenosis Stable Stop ASA No change required today Follows with Dr Radford Pax and vascular surgery at [facility], benefits and potential toxicities for medications prescribed and/or refilled reviewed with patient today.   Return in 6 months to AF clinic Return to see me in a year Follow-up with Dr Radford Pax as scheduled  [name] MD, [facility] 11/10/2019 12:05 PM

## 2019-11-10 NOTE — Patient Instructions (Addendum)
Medication Instructions:  1 Stop Aspirin  *If you need a refill on your cardiac medications before your next appointment, please call your pharmacy*  Lab Work: None ordered.  If you have labs (blood work) drawn today and your tests are completely normal, you will receive your results only by: Marland Kitchen MyChart Message (if you have MyChart) OR . A paper copy in the mail If you have any lab test that is abnormal or we need to change your treatment, we will call you to review the results.  Testing/Procedures: None ordered.  Follow-Up: At [facility], you and your health needs are our priority.  As part of our continuing mission to provide you with exceptional heart care, we have created designated Provider Care Teams.  These Care Teams include your primary Cardiologist (physician) and Advanced Practice Providers (APPs -  Physician Assistants and Nurse Practitioners) who all work together to provide you with the care you need, when you need it.  We recommend signing up for the patient portal called "MyChart".  Sign up information is provided on this After Visit Summary.  MyChart is used to connect with patients for Virtual Visits (Telemedicine).  Patients are able to view lab/test results, encounter notes, upcoming appointments, etc.  Non-urgent messages can be sent to your provider as well.   To learn more about what you can do with MyChart, go to [email or web address].    Your next appointment:   Your physician wants you to follow-up in: 6 months with the Afib Clinic. You will receive a reminder letter in the mail two months in advance. If you don't receive a letter, please call our office to schedule the follow-up appointment.  Your physician wants you to follow-up in: 11/15/20 at 12:15 with Dr. Rayann Heman.   Other Instructions:

## 2019-11-18 DIAGNOSIS — Z85828 Personal history of other malignant neoplasm of skin: Secondary | ICD-10-CM | POA: Diagnosis not present

## 2019-11-18 DIAGNOSIS — D225 Melanocytic nevi of trunk: Secondary | ICD-10-CM | POA: Diagnosis not present

## 2019-11-18 DIAGNOSIS — L821 Other seborrheic keratosis: Secondary | ICD-10-CM | POA: Diagnosis not present

## 2019-11-20 DIAGNOSIS — G8929 Other chronic pain: Secondary | ICD-10-CM | POA: Diagnosis not present

## 2019-11-29 ENCOUNTER — Other Ambulatory Visit: Payer: Self-pay | Admitting: Cardiology

## 2019-12-01 NOTE — Telephone Encounter (Signed)
Eliquis 5mg  refill request received. Patient is 64 years old, weight-92.5kg, Crea-0.94 on 09/17/2019, Diagnosis-Afib, and last seen by Dr. Rayann Heman on 11/10/2019. Dose is appropriate based on dosing criteria. Will send in refill to requested pharmacy.

## 2020-01-01 DIAGNOSIS — E782 Mixed hyperlipidemia: Secondary | ICD-10-CM | POA: Diagnosis not present

## 2020-01-01 DIAGNOSIS — Z125 Encounter for screening for malignant neoplasm of prostate: Secondary | ICD-10-CM | POA: Diagnosis not present

## 2020-01-01 DIAGNOSIS — Z0000 Encounter for general adult medical examination without abnormal findings: Secondary | ICD-10-CM | POA: Diagnosis not present

## 2020-01-01 DIAGNOSIS — R7309 Other abnormal glucose: Secondary | ICD-10-CM | POA: Diagnosis not present

## 2020-01-01 DIAGNOSIS — Z8673 Personal history of transient ischemic attack (TIA), and cerebral infarction without residual deficits: Secondary | ICD-10-CM | POA: Diagnosis not present

## 2020-01-01 DIAGNOSIS — H35039 Hypertensive retinopathy, unspecified eye: Secondary | ICD-10-CM | POA: Diagnosis not present

## 2020-01-01 DIAGNOSIS — Z79899 Other long term (current) drug therapy: Secondary | ICD-10-CM | POA: Diagnosis not present

## 2020-01-01 DIAGNOSIS — Z87442 Personal history of urinary calculi: Secondary | ICD-10-CM | POA: Diagnosis not present

## 2020-01-01 DIAGNOSIS — E039 Hypothyroidism, unspecified: Secondary | ICD-10-CM | POA: Diagnosis not present

## 2020-01-01 DIAGNOSIS — R7303 Prediabetes: Secondary | ICD-10-CM | POA: Diagnosis not present

## 2020-01-01 DIAGNOSIS — G8929 Other chronic pain: Secondary | ICD-10-CM | POA: Diagnosis not present

## 2020-01-21 ENCOUNTER — Other Ambulatory Visit: Payer: Self-pay | Admitting: Physical Medicine & Rehabilitation

## 2020-01-28 DIAGNOSIS — I639 Cerebral infarction, unspecified: Secondary | ICD-10-CM | POA: Diagnosis not present

## 2020-01-28 DIAGNOSIS — I480 Paroxysmal atrial fibrillation: Secondary | ICD-10-CM | POA: Diagnosis not present

## 2020-01-28 DIAGNOSIS — G8929 Other chronic pain: Secondary | ICD-10-CM | POA: Diagnosis not present

## 2020-01-28 DIAGNOSIS — J029 Acute pharyngitis, unspecified: Secondary | ICD-10-CM | POA: Diagnosis not present

## 2020-01-28 DIAGNOSIS — J329 Chronic sinusitis, unspecified: Secondary | ICD-10-CM | POA: Diagnosis not present

## 2020-01-28 DIAGNOSIS — E782 Mixed hyperlipidemia: Secondary | ICD-10-CM | POA: Diagnosis not present

## 2020-01-28 DIAGNOSIS — H35039 Hypertensive retinopathy, unspecified eye: Secondary | ICD-10-CM | POA: Diagnosis not present

## 2020-01-28 DIAGNOSIS — E039 Hypothyroidism, unspecified: Secondary | ICD-10-CM | POA: Diagnosis not present

## 2020-01-28 DIAGNOSIS — I4891 Unspecified atrial fibrillation: Secondary | ICD-10-CM | POA: Diagnosis not present

## 2020-03-10 ENCOUNTER — Other Ambulatory Visit: Payer: Self-pay

## 2020-03-10 MED ORDER — ATORVASTATIN CALCIUM 40 MG PO TABS: ORAL_TABLET | ORAL | 2 refills | Status: DC

## 2020-03-12 DIAGNOSIS — I480 Paroxysmal atrial fibrillation: Secondary | ICD-10-CM | POA: Diagnosis not present

## 2020-03-12 DIAGNOSIS — K640 First degree hemorrhoids: Secondary | ICD-10-CM | POA: Diagnosis not present

## 2020-03-12 DIAGNOSIS — I779 Disorder of arteries and arterioles, unspecified: Secondary | ICD-10-CM | POA: Diagnosis not present

## 2020-03-12 DIAGNOSIS — Z8673 Personal history of transient ischemic attack (TIA), and cerebral infarction without residual deficits: Secondary | ICD-10-CM | POA: Diagnosis not present

## 2020-03-12 DIAGNOSIS — Z7901 Long term (current) use of anticoagulants: Secondary | ICD-10-CM | POA: Diagnosis not present

## 2020-03-12 DIAGNOSIS — K625 Hemorrhage of anus and rectum: Secondary | ICD-10-CM | POA: Diagnosis not present

## 2020-03-12 DIAGNOSIS — K5901 Slow transit constipation: Secondary | ICD-10-CM | POA: Diagnosis not present

## 2020-03-17 DIAGNOSIS — K625 Hemorrhage of anus and rectum: Secondary | ICD-10-CM | POA: Diagnosis not present

## 2020-04-01 ENCOUNTER — Encounter: Payer: Medicare HMO | Admitting: Registered Nurse

## 2020-04-05 ENCOUNTER — Encounter: Payer: Self-pay | Admitting: Registered Nurse

## 2020-04-05 ENCOUNTER — Other Ambulatory Visit: Payer: Self-pay

## 2020-04-05 ENCOUNTER — Encounter: Payer: Medicare HMO | Attending: Registered Nurse | Admitting: Registered Nurse

## 2020-04-05 VITALS — BP 118/77 | HR 69 | Temp 97.9°F | Ht 74.0 in | Wt 205.8 lb

## 2020-04-05 DIAGNOSIS — Z5181 Encounter for therapeutic drug level monitoring: Secondary | ICD-10-CM | POA: Diagnosis not present

## 2020-04-05 DIAGNOSIS — G8929 Other chronic pain: Secondary | ICD-10-CM | POA: Diagnosis not present

## 2020-04-05 DIAGNOSIS — I639 Cerebral infarction, unspecified: Secondary | ICD-10-CM | POA: Diagnosis not present

## 2020-04-05 DIAGNOSIS — E782 Mixed hyperlipidemia: Secondary | ICD-10-CM | POA: Diagnosis not present

## 2020-04-05 DIAGNOSIS — G894 Chronic pain syndrome: Secondary | ICD-10-CM | POA: Diagnosis not present

## 2020-04-05 DIAGNOSIS — Z79891 Long term (current) use of opiate analgesic: Secondary | ICD-10-CM | POA: Insufficient documentation

## 2020-04-05 DIAGNOSIS — I4891 Unspecified atrial fibrillation: Secondary | ICD-10-CM | POA: Diagnosis not present

## 2020-04-05 DIAGNOSIS — E039 Hypothyroidism, unspecified: Secondary | ICD-10-CM | POA: Diagnosis not present

## 2020-04-05 DIAGNOSIS — I480 Paroxysmal atrial fibrillation: Secondary | ICD-10-CM | POA: Diagnosis not present

## 2020-04-05 DIAGNOSIS — G890 Central pain syndrome: Secondary | ICD-10-CM | POA: Insufficient documentation

## 2020-04-05 DIAGNOSIS — H35039 Hypertensive retinopathy, unspecified eye: Secondary | ICD-10-CM | POA: Diagnosis not present

## 2020-04-05 MED ORDER — PREGABALIN 200 MG PO CAPS: 200.0000 mg | ORAL_CAPSULE | Freq: Three times a day (TID) | ORAL | 1 refills | Status: DC

## 2020-04-05 MED ORDER — TRAMADOL HCL 50 MG PO TABS: ORAL_TABLET | ORAL | 1 refills | Status: DC

## 2020-04-05 NOTE — Progress Notes (Signed)
Subjective:    Patient ID: [name], male    DOB: [birth year], 65 y.o.   MRN: [id number]  HPI: [name] is a 65 y.o. male who returns for follow up appointment for chronic pain and medication refill. He states his pain is located in his right lower extremity radiating into his right foot. He also report numbness in his left foot, he describe the numbness as a new pain. He rates his pain 8. His  current exercise regime is walking.  [name] asked if [name] needed to see a neurologist for his left foot numbness. [name] states my left foot feels numb only. [name] denies any neurologic deficits in [name].She was instructed if  [name] develops any neurologic changes to have him evaluated at ED, she verbalizes understanding. The above will be discussed the above with Dr [name], they verbalize understanding.   [name] Morphine equivalent is 38.67 MME.UDS ordered today.     Pain Inventory Average Pain 8 Pain Right Now 8 My pain is constant and aching  In the last 24 hours, has pain interfered with the following? General activity 8 Relation with others 8 Enjoyment of life 8 What TIME of day is your pain at its worst? morning  Sleep (in general) Fair  Pain is worse with: walking, bending, sitting and standing Pain improves with: heat/ice Relief from Meds: 5  Family History  Problem Relation Age of Onset  . Cancer Mother   . Heart failure Father    Social History   Socioeconomic History  . Marital status: Married    Spouse name: Not on file  . Number of children: Not on file  . Years of education: Not on file  . Highest education level: Not on file  Occupational History  . Not on file  Tobacco Use  . Smoking status: Never Smoker  . Smokeless tobacco: Never Used  Vaping Use  . Vaping Use: Never used  Substance and Sexual Activity  . Alcohol use: Yes    Alcohol/week: 1.0 standard drink    Types: 1 Cans of beer per week     Comment: occasionally maybe the weekends  . Drug use: No  . Sexual activity: Not on file  Other Topics Concern  . Not on file  Social History Narrative  . Not on file   Social Determinants of Health   Financial Resource Strain: Not on file  Food Insecurity: Not on file  Transportation Needs: Not on file  Physical Activity: Not on file  Stress: Not on file  Social Connections: Not on file   Past Surgical History:  Procedure Laterality Date  . ATRIAL FIBRILLATION ABLATION N/A 03/27/2017   Procedure: ATRIAL FIBRILLATION ABLATION;  Surgeon: Glass Grayer, MD;  Location: [place];  Service: Cardiovascular;  Laterality: N/A;  . LITHOTRIPSY    . VASECTOMY     Past Surgical History:  Procedure Laterality Date  . ATRIAL FIBRILLATION ABLATION N/A 03/27/2017   Procedure: ATRIAL FIBRILLATION ABLATION;  Surgeon: Glass Grayer, MD;  Location: [facility];  Service: Cardiovascular;  Laterality: N/A;  . LITHOTRIPSY    . VASECTOMY     Past Medical History:  Diagnosis Date  . Altered sensation due to stroke 12/10/2014  . 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   36mm by echo 08/2019  . Basal cell carcinoma   . Bradycardia 10/21/2015  . Contusion of calf, left, initial encounter  12/03/2015  . 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 Hyperlipidemia LDL goal <70 05/31/2016  . ICAO (internal carotid artery occlusion), left 07/10/2016   chronically occluded left ICA and s/p right [facility] at [facility]  . 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 Nephrolithiasis   . Neuropathy   . PAF (paroxysmal atrial fibrillation) ([place]) 07/10/2016   s/p afib ablation 02/2017  . Typical atrial flutter (HCC)    BP 118/77   Pulse 69   Temp 97.9 F (36.6 C)   Ht 6\' 2"  (1.88 m)   Wt 205 lb 12.8 oz (93.4 kg)   SpO2 97%   BMI 26.42 kg/m   Opioid Risk Score:   Fall Risk Score:  `1  Depression  screen PHQ 2/9  Depression screen [facility] 2/9 10/07/2019 12/29/2016 06/04/2015 10/23/2014  Decreased Interest 0 0 0 0  Down, Depressed, Hopeless 0 0 0 0  PHQ - 2 Score 0 0 0 0  Altered sleeping - - - 3  Tired, decreased energy - - - 3  Change in appetite - - - 0  Feeling bad or failure about yourself  - - - 0  Trouble concentrating - - - 0  Moving slowly or fidgety/restless - - - 0  Suicidal thoughts - - - 0  PHQ-9 Score - - - 6  Difficult doing work/chores - - - Not difficult at all    Review of Systems  Neurological: Positive for numbness.  All other systems reviewed and are negative.      Objective:   Physical Exam Vitals and nursing note reviewed.  Constitutional:      Appearance: Normal appearance.  Cardiovascular:     Rate and Rhythm: Normal rate and regular rhythm.     Pulses: Normal pulses.     Heart sounds: Normal heart sounds.  Pulmonary:     Effort: Pulmonary effort is normal.     Breath sounds: Normal breath sounds.  Musculoskeletal:     Cervical back: Normal range of motion and neck supple.     Comments: Normal Muscle Bulk and Muscle Testing Reveals:  Upper Extremities: Full ROM and Muscle Strength 5/5  Lower Extremities: Full ROM and Muscle Strength 5/5 Right Lower Extremity Flexion Produces Pain into his Right Lower Extremity Arises from Table with Ease Narrow Based Gait   Skin:    General: Skin is warm and dry.  Neurological:     Mental Status: He is alert and oriented to person, place, and time.  Psychiatric:        Mood and Affect: Mood normal.        Behavior: Behavior normal.           Assessment & Plan:  1. 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 #720 90 day supply and  Lyrica 200 mg one capsule three times a day #270.  2. Chronic Pain: Continue Tramadol and Lyrica. Continue to Monitor. We will continue the opioid monitoring program, this consists of regular  clinic visits, examinations, urine drug screen, pill counts as well as use of [place] Controlled Substance Reporting system. A 12 month History has been reviewed on the [place] Controlled Substance Reporting System on 04/05/2020.  F/U in 6 months

## 2020-04-07 ENCOUNTER — Ambulatory Visit: Payer: Medicare HMO | Admitting: Registered Nurse

## 2020-04-08 ENCOUNTER — Ambulatory Visit: Payer: Medicare HMO | Admitting: Registered Nurse

## 2020-04-08 DIAGNOSIS — G890 Central pain syndrome: Secondary | ICD-10-CM | POA: Diagnosis not present

## 2020-04-09 LAB — TOXASSURE SELECT,+ANTIDEPR,UR

## 2020-04-14 ENCOUNTER — Telehealth: Payer: Self-pay | Admitting: *Deleted

## 2020-04-14 NOTE — Telephone Encounter (Signed)
Urine drug screen for this encounter is consistent for prescribed medication 

## 2020-04-26 DIAGNOSIS — H2513 Age-related nuclear cataract, bilateral: Secondary | ICD-10-CM | POA: Diagnosis not present

## 2020-04-26 DIAGNOSIS — H53461 Homonymous bilateral field defects, right side: Secondary | ICD-10-CM | POA: Diagnosis not present

## 2020-04-26 DIAGNOSIS — H43813 Vitreous degeneration, bilateral: Secondary | ICD-10-CM | POA: Diagnosis not present

## 2020-04-26 DIAGNOSIS — H40013 Open angle with borderline findings, low risk, bilateral: Secondary | ICD-10-CM | POA: Diagnosis not present

## 2020-05-04 NOTE — Progress Notes (Signed)
Cardiology Office Note Date:  05/04/2020  Patient ID:  [name], DOB 07/08/1955, MRN [id number] PCP:  Hulan Fess, MD  Cardiologist:  Dr. Radford Pax Electrophysiologist: Dr. Rayann Heman   Chief Complaint:  pre-op, lumbar injection  History of Present Illness: [name] is a 65 y.o. male with history of AFib, stroke, PVD (s/p R CEA, follows with vascular team at [facility]), HLD.  He come sin today to be seen for Dr. Rayann Heman, last seen by him  Sept 2021, he was doing well, his ASA stopped, continued Eliquis, planned for 6 mo follow up with AFib clinic.   TODAY He comes accompanied by his wife. Last summer had a few hours of palpitations, decided to get check though stopped by the time they arrived to the hospital, was noted in normal rhythm, this was the last time he has felt any symptoms of his Afib No CP, SOB or DOE. No dizzy spells, near syncope or syncope.  No bleeding or signs of bleeding. They report the vascular specialist has recommended every other year visits now. PMD does labs for him   He has terrible RLE pain and planned for nerve block 2/2 this.  Procedure planned: sympathetic nerve block  Needs 3 days off Eliquis  RCRI score is one, w/0.9% risk [facility]: 8.97METS   AFib Hx Diagnosed 2011 (post nephrostomy procedure) 2016 again as well as Aflutter PVI/CTI ablation 03/27/2017  Past Medical History:  Diagnosis Date  . Altered sensation due to stroke 12/10/2014  . 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   44mm by echo 08/2019  . Basal cell carcinoma   . Bradycardia 10/21/2015  . Contusion of calf, left, initial encounter 12/03/2015  . 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 Hyperlipidemia LDL goal <70 05/31/2016  . ICAO (internal carotid artery occlusion), left 07/10/2016   chronically occluded left ICA and s/p right CEA at [facility]  . Left middle cerebral artery stroke  ([place]) 09/29/2014  . Nephrolithiasis   . Neuropathy   . PAF (paroxysmal atrial fibrillation) ([place]) 07/10/2016   s/p afib ablation 02/2017  . Typical atrial flutter [facility])     Past Surgical History:  Procedure Laterality Date  . ATRIAL FIBRILLATION ABLATION N/A 03/27/2017   Procedure: ATRIAL FIBRILLATION ABLATION;  Surgeon: [name], MD;  Location: [place] CV [facility];  Service: Cardiovascular;  Laterality: N/A;  . LITHOTRIPSY    . VASECTOMY      Current Outpatient Medications  Medication Sig Dispense Refill  . acetaminophen (TYLENOL) 500 MG tablet Take 1,000 mg by mouth every 6 (six) hours as needed (for pain/headaches.).    Marland Kitchen atorvastatin (LIPITOR) 40 MG tablet TAKE 1 TABLET DAILY AT 6 PM 90 tablet 2  . ELIQUIS 5 MG TABS tablet Take 1 tablet by mouth twice daily 180 tablet 2  . fluticasone (FLONASE) 50 MCG/ACT nasal spray Place 1 spray into both nostrils at bedtime.     Marland Kitchen levothyroxine (SYNTHROID, LEVOTHROID) 25 MCG tablet Take 25 mcg by mouth daily before breakfast.     . nortriptyline (PAMELOR) 10 MG capsule TAKE TWO CAPSULES BY MOUTH EVERY NIGHT AT BEDTIME 60 capsule 2  . polyethylene glycol (MIRALAX / GLYCOLAX) packet Take 17 g by mouth daily.    . pregabalin (LYRICA) 200 MG capsule Take 1 capsule (200 mg total) by mouth 3 (three) times daily. 270 capsule 1  . Probiotic Product (PROBIOTIC PO) Take 1  tablet by mouth at bedtime.    . propranolol (INDERAL) 10 MG tablet Take 1 tablet (10 mg total) by mouth daily as needed (palpitations). 30 tablet 3  . senna (SENOKOT) 8.6 MG tablet Take 2 tablets by mouth 2 (two) times daily. Bedtime    . traMADol (ULTRAM) 50 MG tablet TAKE TWO TABLETS BY MOUTH FOUR TIMES A DAY 720 tablet 1   No current facility-administered medications for this visit.    Allergies:   Patient has no known allergies.   Social History:  The patient  reports that he has never smoked. He has never used smokeless tobacco. He reports current alcohol use of about 1.0  standard drink of alcohol per week. He reports that he does not use drugs.   Family History:  The patient's family history includes Cancer in his mother; Heart failure in his father.  ROS:  Please see the history of present illness.    All other systems are reviewed and otherwise negative.   PHYSICAL EXAM:  VS:  There were no vitals taken for this visit. BMI: There is no height or weight on file to calculate BMI. Well nourished, well developed, in no acute distress HEENT: normocephalic, atraumatic Neck: no JVD, carotid bruits or masses Cardiac:  RRR; no significant murmurs, no rubs, or gallops Lungs:  CTA b/l, no wheezing, rhonchi or rales Abd: soft, nontender MS: no deformity or atrophy Ext: no edema Skin: warm and dry, no rash Neuro:  No gross deficits appreciated Psych: euthymic mood, full affect   EKG:  Done today and reviewed by myself shows  SR 70bpm, unchanged  Echocardiogram 09/23/2019 EF 55-60, normal wall motion, normal diastolic function, normal RVSF, ascending aorta 39 mm  Chest CTA 09/02/2018 Ascending thoracic aorta 3.6 x 3.6 cm   03/27/2017: EPS/ablation CONCLUSIONS: 1. Sinus rhythm upon presentation.   2. Intracardiac echo reveals a moderate sized left atrium with four separate pulmonary veins without evidence of pulmonary vein stenosis. 3. Successful electrical isolation and anatomical encircling of all four pulmonary veins with radiofrequency current.    Entrance and exit block were difficult to obtain.  Antral ablation was required.  Should the patient have additional afib, would consider cryo ablation. 4. Cavo-tricuspid isthmus ablation was performed  5. No inducible arrhythmias following ablation both on and off of Isuprel 6. No early apparent complications.   Cardiac CT 03/20/2017 IMPRESSION: 1. No LAA thrombus mild bi atrial enlargement 2. Normal PV anatomy see dimensions above 3. Mild aortic root enlargement 3.8 cm 4. Esophagus courses closest  to the LLPV ostium 5. No pericardial effusion 6. Calcium score 0  Carotid US 01/14/2016 R 60-79; L occluded  Myoview 05/29/2014 EF 63, no ischemia  Recent Labs: 09/17/2019: BUN 11; Creatinine, Ser 0.94; Hemoglobin 14.6; Platelets 230; Potassium 4.8; Sodium 141  No results found for requested labs within last 8760 hours.   CrCl cannot be calculated (Patient's most recent lab result is older than the maximum 21 days allowed.).   Wt Readings from Last 3 Encounters:  04/05/20 205 lb 12.8 oz (93.4 kg)  11/10/19 204 lb (92.5 kg)  10/13/19 204 lb (92.5 kg)     Other studies reviewed: Additional studies/records reviewed today include: summarized above  ASSESSMENT AND PLAN:  1. Paroxysmal Afib.flutter     S/p ablation     CHA2DS2Vasc is 4, on Eliquis, appropriately dosed       2. PVD     H/o CEA     follows with [facility] vascular team  This give him stroke risk apart from his Afib, we discussed this today as well from a general perspective as well as operative risk.Marland Kitchen He follows with vascular specialist.  3. Pre-op     Low cardiac risk score, low risk procedure     Pt has had a stroke, and hx of CEA     No symptoms of his AFib      No need for any pre-procedure cardiac testing, no cardiac contraindication for nerve block procedure     OK for 3 days off Eliquis, to resume as soon as safe to do so afterwards.   Disposition: F/u with Dr. Rayann Heman in Sept as scheduled   Current medicines are reviewed at length with the patient today.  The patient did not have any concerns regarding medicines.  Venetia Night, PA-C 05/04/2020 7:51 PM     [address] [place] [address] [phone] (office)  [phone] (fax)

## 2020-05-06 ENCOUNTER — Other Ambulatory Visit: Payer: Self-pay

## 2020-05-06 ENCOUNTER — Encounter: Payer: Self-pay | Admitting: Physician Assistant

## 2020-05-06 ENCOUNTER — Ambulatory Visit: Payer: Medicare HMO | Admitting: Physician Assistant

## 2020-05-06 VITALS — BP 126/70 | HR 70 | Ht 74.0 in | Wt 206.0 lb

## 2020-05-06 DIAGNOSIS — Z01818 Encounter for other preprocedural examination: Secondary | ICD-10-CM | POA: Diagnosis not present

## 2020-05-06 DIAGNOSIS — I480 Paroxysmal atrial fibrillation: Secondary | ICD-10-CM | POA: Diagnosis not present

## 2020-05-06 NOTE — Patient Instructions (Signed)
Medication Instructions:   Your physician recommends that you continue on your current medications as directed. Please refer to the Current Medication list given to you today.  *If you need a refill on your cardiac medications before your next appointment, please call your pharmacy*   Lab Work: NONE ORDERED  TODAY   If you have labs (blood work) drawn today and your tests are completely normal, you will receive your results only by: . MyChart Message (if you have MyChart) OR . A paper copy in the mail If you have any lab test that is abnormal or we need to change your treatment, we will call you to review the results.   Testing/Procedures: NONE ORDERED  TODAY    Follow-Up: At [facility], you and your health needs are our priority.  As part of our continuing mission to provide you with exceptional heart care, we have created designated Provider Care Teams.  These Care Teams include your primary Cardiologist (physician) and Advanced Practice Providers (APPs -  Physician Assistants and Nurse Practitioners) who all work together to provide you with the care you need, when you need it.  We recommend signing up for the patient portal called "MyChart".  Sign up information is provided on this After Visit Summary.  MyChart is used to connect with patients for Virtual Visits (Telemedicine).  Patients are able to view lab/test results, encounter notes, upcoming appointments, etc.  Non-urgent messages can be sent to your provider as well.   To learn more about what you can do with MyChart, go to [email or web address].    Your next appointment:  AS SCHEDULED   Other Instructions   

## 2020-05-12 DIAGNOSIS — G890 Central pain syndrome: Secondary | ICD-10-CM | POA: Diagnosis not present

## 2020-05-12 DIAGNOSIS — G90522 Complex regional pain syndrome I of left lower limb: Secondary | ICD-10-CM | POA: Diagnosis not present

## 2020-06-12 ENCOUNTER — Telehealth: Payer: Self-pay | Admitting: Physician Assistant

## 2020-06-12 NOTE — Telephone Encounter (Signed)
   The patient's wife called the answering service after-hours today. He is scheduled for a right sympathetic lumbar nerve block on Monday. They forgot to stop his Eliquis as previously instructed in preparation for the procedure and are wondering if he can still go ahead with only holding it for 2 days. I told her typically spine injections require a minimum of a 3 days but the ultimate decision would be up to the doctor performing the procedure, and so I advised that she contact their office for further guidance. She verbalized understanding and gratitude and plans to do so.  Charlie Pitter, PA-C

## 2020-06-14 DIAGNOSIS — R4701 Aphasia: Secondary | ICD-10-CM | POA: Diagnosis not present

## 2020-06-14 DIAGNOSIS — G890 Central pain syndrome: Secondary | ICD-10-CM | POA: Diagnosis not present

## 2020-06-14 DIAGNOSIS — Z7901 Long term (current) use of anticoagulants: Secondary | ICD-10-CM | POA: Diagnosis not present

## 2020-06-14 DIAGNOSIS — G894 Chronic pain syndrome: Secondary | ICD-10-CM | POA: Diagnosis not present

## 2020-06-14 DIAGNOSIS — I4891 Unspecified atrial fibrillation: Secondary | ICD-10-CM | POA: Diagnosis not present

## 2020-06-14 DIAGNOSIS — G90521 Complex regional pain syndrome I of right lower limb: Secondary | ICD-10-CM | POA: Diagnosis not present

## 2020-06-14 DIAGNOSIS — I6529 Occlusion and stenosis of unspecified carotid artery: Secondary | ICD-10-CM | POA: Diagnosis not present

## 2020-06-14 DIAGNOSIS — Z8673 Personal history of transient ischemic attack (TIA), and cerebral infarction without residual deficits: Secondary | ICD-10-CM | POA: Diagnosis not present

## 2020-06-14 DIAGNOSIS — Q631 Lobulated, fused and horseshoe kidney: Secondary | ICD-10-CM | POA: Diagnosis not present

## 2020-06-15 DIAGNOSIS — H35039 Hypertensive retinopathy, unspecified eye: Secondary | ICD-10-CM | POA: Diagnosis not present

## 2020-06-15 DIAGNOSIS — I4891 Unspecified atrial fibrillation: Secondary | ICD-10-CM | POA: Diagnosis not present

## 2020-06-15 DIAGNOSIS — I639 Cerebral infarction, unspecified: Secondary | ICD-10-CM | POA: Diagnosis not present

## 2020-06-15 DIAGNOSIS — E039 Hypothyroidism, unspecified: Secondary | ICD-10-CM | POA: Diagnosis not present

## 2020-06-15 DIAGNOSIS — G8929 Other chronic pain: Secondary | ICD-10-CM | POA: Diagnosis not present

## 2020-06-15 DIAGNOSIS — I480 Paroxysmal atrial fibrillation: Secondary | ICD-10-CM | POA: Diagnosis not present

## 2020-06-15 DIAGNOSIS — E782 Mixed hyperlipidemia: Secondary | ICD-10-CM | POA: Diagnosis not present

## 2020-07-02 DIAGNOSIS — M79604 Pain in right leg: Secondary | ICD-10-CM | POA: Diagnosis not present

## 2020-07-02 DIAGNOSIS — G890 Central pain syndrome: Secondary | ICD-10-CM | POA: Diagnosis not present

## 2020-07-05 DIAGNOSIS — M79604 Pain in right leg: Secondary | ICD-10-CM | POA: Diagnosis not present

## 2020-07-05 DIAGNOSIS — M545 Low back pain, unspecified: Secondary | ICD-10-CM | POA: Diagnosis not present

## 2020-07-06 ENCOUNTER — Telehealth: Payer: Self-pay

## 2020-07-06 NOTE — Telephone Encounter (Signed)
Requesting 90 day of Nortriptyline 10 mg. [facility] Richarda Osmond. Is this okay?

## 2020-07-07 MED ORDER — NORTRIPTYLINE HCL 10 MG PO CAPS: ORAL_CAPSULE | ORAL | 2 refills | Status: DC

## 2020-07-28 DIAGNOSIS — I639 Cerebral infarction, unspecified: Secondary | ICD-10-CM | POA: Diagnosis not present

## 2020-07-28 DIAGNOSIS — I480 Paroxysmal atrial fibrillation: Secondary | ICD-10-CM | POA: Diagnosis not present

## 2020-07-28 DIAGNOSIS — I4891 Unspecified atrial fibrillation: Secondary | ICD-10-CM | POA: Diagnosis not present

## 2020-07-28 DIAGNOSIS — H35039 Hypertensive retinopathy, unspecified eye: Secondary | ICD-10-CM | POA: Diagnosis not present

## 2020-07-28 DIAGNOSIS — E039 Hypothyroidism, unspecified: Secondary | ICD-10-CM | POA: Diagnosis not present

## 2020-07-28 DIAGNOSIS — E782 Mixed hyperlipidemia: Secondary | ICD-10-CM | POA: Diagnosis not present

## 2020-07-28 DIAGNOSIS — G8929 Other chronic pain: Secondary | ICD-10-CM | POA: Diagnosis not present

## 2020-08-30 ENCOUNTER — Other Ambulatory Visit: Payer: Self-pay | Admitting: Internal Medicine

## 2020-08-31 NOTE — Telephone Encounter (Signed)
Prescription refill request for Eliquis received. Indication: afib  Last office visit: 05/06/2020, Charlcie Cradle Scr: 0.94, 09/17/2019 Age: 65 yo  Weight: 93.4 kg   Pt is on the correct doe of Eliquis per dosing criteria, prescription refill sent for Eliquis 5mg  BID.

## 2020-09-27 DIAGNOSIS — U071 COVID-19: Secondary | ICD-10-CM | POA: Diagnosis not present

## 2020-09-27 DIAGNOSIS — Z6826 Body mass index (BMI) 26.0-26.9, adult: Secondary | ICD-10-CM | POA: Diagnosis not present

## 2020-09-30 ENCOUNTER — Other Ambulatory Visit: Payer: Self-pay

## 2020-09-30 ENCOUNTER — Encounter: Payer: Self-pay | Admitting: Physical Medicine & Rehabilitation

## 2020-09-30 ENCOUNTER — Encounter: Payer: Medicare HMO | Attending: Physical Medicine & Rehabilitation | Admitting: Physical Medicine & Rehabilitation

## 2020-09-30 VITALS — BP 106/76 | Ht 74.0 in | Wt 206.0 lb

## 2020-09-30 DIAGNOSIS — G890 Central pain syndrome: Secondary | ICD-10-CM

## 2020-09-30 MED ORDER — PREGABALIN 200 MG PO CAPS: 200.0000 mg | ORAL_CAPSULE | Freq: Three times a day (TID) | ORAL | 1 refills | Status: DC

## 2020-09-30 MED ORDER — TRAMADOL HCL 50 MG PO TABS: ORAL_TABLET | ORAL | 1 refills | Status: DC

## 2020-09-30 NOTE — Progress Notes (Signed)
Subjective:    Patient ID: [name], male    DOB: 10/24/1955, 65 y.o.   MRN: [id number] Chart video visit, 15-minute duration, patient at home provider in office HPI 65 year old male who had a left posterior MCA distribution infarct September 25, 2014.  The patient has severe receptive aphasia, he had good motor recovery on the right side but has persistent right hemisensory deficits as well as pain in the right lower extremity in a nondermatomal circumferential distribution. Lumbar MRI in 2016 was normal.  The patient was trialed on Cymbalta which was not effective, gabapentin which was not effective, tapentadol which was not effective. The patient has had partial relief with high-dose tramadol 100 mg 4 times daily as well as maximum dose pregabalin 200 3 times daily.  Also on low-dose nortriptyline 10 mg nightly Despite this medication regimen the patient still complaining of fairly severe pain in the right lower extremity.  He was referred by his primary care to [facility] pain clinic where lumbar sympathetic injections were performed x3 but not helpful. Discussed other treatment options with the patient and his wife. Pain Inventory Average Pain 8 Pain Right Now 8 My pain is constant  In the last 24 hours, has pain interfered with the following? General activity 8 Relation with others 8 Enjoyment of life 8 What TIME of day is your pain at its worst? varies Sleep (in general) Fair  Pain is worse with: walking and some activites Pain improves with: heat/ice and medication Relief from Meds: 4  Family History  Problem Relation Age of Onset   Cancer Mother    Heart failure Father    Social History   Socioeconomic History   Marital status: Married    Spouse name: Not on file   Number of children: Not on file   Years of education: Not on file   Highest education level: Not on file  Occupational History   Not on file  Tobacco Use   Smoking status: Never    Smokeless tobacco: Never  Vaping Use   Vaping Use: Never used  Substance and Sexual Activity   Alcohol use: Yes    Alcohol/week: 1.0 standard drink    Types: 1 Cans of beer per week    Comment: occasionally maybe the weekends   Drug use: No   Sexual activity: Not on file  Other Topics Concern   Not on file  Social History Narrative   Not on file   Social Determinants of Health   Financial Resource Strain: Not on file  Food Insecurity: Not on file  Transportation Needs: Not on file  Physical Activity: Not on file  Stress: Not on file  Social Connections: Not on file   Past Surgical History:  Procedure Laterality Date   ATRIAL FIBRILLATION ABLATION N/A 03/27/2017   Procedure: [place];  Surgeon: [name], MD;  Location: [place] [facility];  Service: Cardiovascular;  Laterality: N/A;   LITHOTRIPSY     VASECTOMY     Past Surgical History:  Procedure Laterality Date   ATRIAL FIBRILLATION ABLATION N/A 03/27/2017   Procedure: ATRIAL FIBRILLATION ABLATION;  Surgeon: [name], MD;  Location: [place] [facility];  Service: Cardiovascular;  Laterality: N/A;   LITHOTRIPSY     VASECTOMY     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   Ascending aorta dilatation ([place]) 10/20/2015   12m by  echo 08/2019   Basal cell carcinoma    Bradycardia 10/21/2015   Contusion of calf, left, initial encounter 12/03/2015   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   Left post branch MCA infarct hasRLE>> RUE symptoms    Hyperlipidemia LDL goal <70 05/31/2016   ICAO (internal carotid artery occlusion), left 07/10/2016   chronically occluded left ICA and s/p right [facility] at [facility]   Left middle cerebral artery stroke ([place]) 09/29/2014   Nephrolithiasis    Neuropathy    PAF (paroxysmal atrial fibrillation) ([place]) 07/10/2016   s/p afib ablation 02/2017   Typical atrial flutter (HCC)    BP 106/76   Ht '6\' 2"'$  (1.88  m)   Wt 206 lb (93.4 kg)   BMI 26.45 kg/m   Opioid Risk Score:   Fall Risk Score:  `1  Depression screen PHQ 2/9  Depression screen [facility] 2/9 10/07/2019 12/29/2016 06/04/2015 10/23/2014  Decreased Interest 0 0 0 0  Down, Depressed, Hopeless 0 0 0 0  PHQ - 2 Score 0 0 0 0  Altered sleeping - - - 3  Tired, decreased energy - - - 3  Change in appetite - - - 0  Feeling bad or failure about yourself  - - - 0  Trouble concentrating - - - 0  Moving slowly or fidgety/restless - - - 0  Suicidal thoughts - - - 0  PHQ-9 Score - - - 6  Difficult doing work/chores - - - Not difficult at all      Review of Systems  Constitutional: Negative.   HENT: Negative.    Eyes: Negative.   Respiratory: Negative.    Cardiovascular: Negative.   Gastrointestinal: Negative.   Endocrine: Negative.   Genitourinary: Negative.   Musculoskeletal:  Positive for gait problem.       Pain in right leg  Skin: Negative.   Allergic/Immunologic: Negative.   Hematological: Negative.   Psychiatric/Behavioral: Negative.        Objective:   Physical Exam Vitals and nursing note reviewed.  Constitutional:      Appearance: He is normal weight.  HENT:     Head: Normocephalic and atraumatic.     Mouth/Throat:     Mouth: Mucous membranes are moist.  Eyes:     Conjunctiva/sclera: Conjunctivae normal.     Pupils: Pupils are equal, round, and reactive to light.  Neurological:     Mental Status: He is alert.     Comments: Receptive aphasia decreased comprehension, fluency is good but has some paraphasic errors.   Psychiatric:        Mood and Affect: Mood normal.        Behavior: Behavior normal.    Exam limited by televisit format      Assessment & Plan:   1.  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  He has failed multiple medication regimens, he has some partial relief with the current regimen although patient is not satisfied with the current degree of relief.  Has failed  sympathetic nerve blocks. Will refer to Dr. Zollie Scale to evaluate for ketamine infusion. Will continue tramadol 100 mg 4 times daily Continue pregabalin 200 mg 3 times daily Follow-up with nurse practitioner in 6 months

## 2020-10-15 DIAGNOSIS — Z8673 Personal history of transient ischemic attack (TIA), and cerebral infarction without residual deficits: Secondary | ICD-10-CM | POA: Diagnosis not present

## 2020-10-15 DIAGNOSIS — M79604 Pain in right leg: Secondary | ICD-10-CM | POA: Diagnosis not present

## 2020-10-15 DIAGNOSIS — G890 Central pain syndrome: Secondary | ICD-10-CM | POA: Diagnosis not present

## 2020-10-15 DIAGNOSIS — G894 Chronic pain syndrome: Secondary | ICD-10-CM | POA: Diagnosis not present

## 2020-10-22 ENCOUNTER — Telehealth: Payer: Self-pay | Admitting: Physical Medicine & Rehabilitation

## 2020-10-22 NOTE — Telephone Encounter (Signed)
[name] wife called states that Dr Holley Raring does not preform the Ketamine injections and he would like to get a referral to a place that does.  Thanks

## 2020-11-15 ENCOUNTER — Telehealth: Payer: Self-pay | Admitting: Cardiology

## 2020-11-15 ENCOUNTER — Ambulatory Visit (INDEPENDENT_AMBULATORY_CARE_PROVIDER_SITE_OTHER): Payer: Medicare HMO | Admitting: Internal Medicine

## 2020-11-15 ENCOUNTER — Other Ambulatory Visit: Payer: Self-pay

## 2020-11-15 VITALS — BP 110/70 | HR 65 | Ht 73.5 in | Wt 209.0 lb

## 2020-11-15 DIAGNOSIS — I77810 Thoracic aortic ectasia: Secondary | ICD-10-CM

## 2020-11-15 DIAGNOSIS — I6523 Occlusion and stenosis of bilateral carotid arteries: Secondary | ICD-10-CM | POA: Diagnosis not present

## 2020-11-15 DIAGNOSIS — E785 Hyperlipidemia, unspecified: Secondary | ICD-10-CM

## 2020-11-15 DIAGNOSIS — I480 Paroxysmal atrial fibrillation: Secondary | ICD-10-CM

## 2020-11-15 DIAGNOSIS — I4892 Unspecified atrial flutter: Secondary | ICD-10-CM

## 2020-11-15 NOTE — Telephone Encounter (Signed)
Matthew Margarita, MD  09/24/2019 11:25 AM EDT     Echo showed normal heart function with trivial leakiness of the MV and mildly dilated ascending aorta.  Repeat echo in 1 year for dilated aorta   Spoke with the patient's wife and have scheduled the patient for his annual echocardiogram.

## 2020-11-15 NOTE — Telephone Encounter (Signed)
Patient's wife would like to know if the patient needs another echo prior to his appointment with Dr. Radford Pax.

## 2020-11-15 NOTE — Progress Notes (Signed)
PCP: [facility] [place] Primary Cardiologist: Dr Radford Pax Primary EP: Dr Rayann Heman  [name] is a 65 y.o. male who presents today for routine electrophysiology followup.  Since last being seen in our clinic, the patient reports doing very well.  Today, he denies symptoms of palpitations, chest pain, shortness of breath,  dizziness, presyncope, or syncope.  + L leg swelling.  Though he has chronic ankle edema, he reports that this has been worse since his recent flight to [place].  [name].   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   Left post branch MCA infarct hasRLE>> RUE symptoms    Hyperlipidemia LDL goal <70 05/31/2016   ICAO (internal carotid artery occlusion), left 07/10/2016   [facility]  [place] 09[place]   Nephrolithiasis    Neuropathy    PA[place]mal [place]) [place] 07/10/2016   s/p afib ablation 02/2017   [place]    Past Surgical History:  Procedure Laterality Date   ATRIAL FIBRILLATION ABLATION N/A 03/27/2017   Procedure: ATRIAL FIBRILLATION ABLATION;  Surgeon: AThompson Grayer MD;  [facility];  Service: Cardiovascular;  Laterality: N/A;   LITHOTRIPSY     VASECTOMY      ROS- all systems are reviewed and negatives except as per HPI above  Current Outpatient Medications  Medication Sig Dispense Refill   acetaminophen (TYLENOL) 500 MG tablet Take 1,000 mg by mouth every 6 (six) hours as needed (for pain/headaches.).     apixaban (ELIQUIS) 5 MG TABS tablet Take 1 tablet by mouth twice daily 180  tablet 1   atorvastatin (LIPITOR) 40 MG tablet TAKE 1 TABLET DAILY AT 6 PM 90 tablet 2   fluticasone (FLONASE) 50 MCG/ACT nasal spray Place 1 spray into both nostrils at bedtime.      LAGEVRIO 200 MG CAPS Take by mouth.     levothyroxine (SYNTHROID, LEVOTHROID) 25 MCG tablet Take 25 mcg by mouth daily before breakfast.      nortriptyline (PAMELOR) 10 MG capsule TAKE TWO CAPSULES BY MOUTH EVERY NIGHT AT BEDTIME 180 capsule 2   nortriptyline (PAMELOR) 10 MG capsule Take by mouth.     polyethylene glycol (MIRALAX / GLYCOLAX) packet Take 17 g by mouth daily.     pregabalin (LYRICA) 200 MG capsule Take 1 capsule (200 mg total) by mouth 3 (three) times daily. 270 capsule 1   Probiotic Product (PROBIOTIC PO) Take 1 tablet by mouth at bedtime.     propranolol (INDERAL) 10 MG tablet Take 1 tablet (10 mg total) by mouth daily as needed (palpitations). 30 tablet 3   senna (SENOKOT) 8.6 MG tablet Take 2 tablets by mouth 2 (two) times daily. Bedtime     traMADol (ULTRAM) 50 MG tablet TAKE TWO TABLETS BY MOUTH FOUR TIMES A DAY 720 tablet 1   No current facility-administered medications for this visit.    Physical Exam: Vitals:   11/15/20 1218  BP: 110/70  Pulse: 65  SpO2: 95%  Weight: 209 lb (94.8 kg)  Height: 6' 1.5" (1.867 m)  GEN- The patient is well appearing, alert and oriented x 3 today.   Head- normocephalic, atraumatic Eyes-  Sclera clear, conjunctiva pink Ears- hearing intact Oropharynx- clear Lungs- Clear to ausculation bilaterally, normal work of breathing Heart- Regular rate and rhythm, no murmurs, rubs or gallops, PMI not laterally displaced GI- soft, NT, ND, + BS Extremities- no clubbing, cyanosis,  L>R lower extremity edema.  + varicosity in the L leg  Wt Readings from Last 3 Encounters:  11/15/20 209 lb (94.8 kg)  09/30/20 206 lb (93.4 kg)  05/06/20 206 lb (93.4 kg)    EKG tracing ordered today is personally reviewed and shows sinus rhythm  Assessment and  Plan:  Paroxysmal atrial fibrillation/ atrial flutter Well controlled post ablation off AAD therapy Chads2vasc score is 4.  He is on eliquis  2. HL Stable No change required today  3. Dilated aortic root/ carotid artery stenosis Stable Follows with Dr Radford Pax and vascular [facility]  4. L leg varicosity/ edema Given that he is on eliquis, my suspicion for DVT is low, however given that it has worsened since his recent trip to [place], we will obtain venous dopplers to evaluate for DVT. I have advised that he follow-up with his vascular surgeon.  Return in a year  [name] MD, [facility] 11/15/2020 12:38 PM

## 2020-11-15 NOTE — Patient Instructions (Addendum)
Medication Instructions:  Your physician recommends that you continue on your current medications as directed. Please refer to the Current Medication list given to you today.  Labwork: None ordered.  Testing/Procedures: Your physician has requested that you have a lower or upper extremity venous duplex. This test is an ultrasound of the veins in the legs or arms. It looks at venous blood flow that carries blood from the heart to the legs or arms. Allow one hour for a Lower Venous exam. Allow thirty minutes for an Upper Venous exam. There are no restrictions or special instructions.  Follow-Up: Your physician wants you to follow-up in: 12 months with  one of the following Advanced Practice Providers on your designated Care Team:    Tommye Standard, [place] Legrand Como "Jonni Sanger" [place], [place]   You will receive a reminder letter in the mail two months in advance. If you don't receive a letter, please call our office to schedule the follow-up appointment.   Any Other Special Instructions Will Be Listed Below (If Applicable).  If you need a refill on your cardiac medications before your next appointment, please call your pharmacy.

## 2020-11-17 ENCOUNTER — Telehealth: Payer: Self-pay | Admitting: *Deleted

## 2020-11-17 ENCOUNTER — Other Ambulatory Visit: Payer: Self-pay

## 2020-11-17 ENCOUNTER — Encounter (HOSPITAL_COMMUNITY): Payer: Medicare HMO

## 2020-11-17 ENCOUNTER — Ambulatory Visit (HOSPITAL_COMMUNITY)
Admission: RE | Admit: 2020-11-17 | Discharge: 2020-11-17 | Disposition: A | Discharge status: Home / Self Care | Payer: Medicare HMO | Source: Ambulatory Visit | Attending: Cardiology | Admitting: Cardiology

## 2020-11-17 DIAGNOSIS — I4892 Unspecified atrial flutter: Secondary | ICD-10-CM | POA: Diagnosis not present

## 2020-11-17 DIAGNOSIS — D2272 Melanocytic nevi of left lower limb, including hip: Secondary | ICD-10-CM | POA: Diagnosis not present

## 2020-11-17 DIAGNOSIS — I480 Paroxysmal atrial fibrillation: Secondary | ICD-10-CM | POA: Diagnosis not present

## 2020-11-17 DIAGNOSIS — I6523 Occlusion and stenosis of bilateral carotid arteries: Secondary | ICD-10-CM | POA: Diagnosis not present

## 2020-11-17 DIAGNOSIS — D225 Melanocytic nevi of trunk: Secondary | ICD-10-CM | POA: Diagnosis not present

## 2020-11-17 DIAGNOSIS — E785 Hyperlipidemia, unspecified: Secondary | ICD-10-CM | POA: Diagnosis not present

## 2020-11-17 DIAGNOSIS — L570 Actinic keratosis: Secondary | ICD-10-CM | POA: Diagnosis not present

## 2020-11-17 DIAGNOSIS — R600 Localized edema: Secondary | ICD-10-CM

## 2020-11-17 DIAGNOSIS — D2261 Melanocytic nevi of right upper limb, including shoulder: Secondary | ICD-10-CM | POA: Diagnosis not present

## 2020-11-17 DIAGNOSIS — Z85828 Personal history of other malignant neoplasm of skin: Secondary | ICD-10-CM | POA: Diagnosis not present

## 2020-11-17 DIAGNOSIS — I77810 Thoracic aortic ectasia: Secondary | ICD-10-CM | POA: Insufficient documentation

## 2020-11-17 DIAGNOSIS — L820 Inflamed seborrheic keratosis: Secondary | ICD-10-CM | POA: Diagnosis not present

## 2020-11-17 DIAGNOSIS — D1801 Hemangioma of skin and subcutaneous tissue: Secondary | ICD-10-CM | POA: Diagnosis not present

## 2020-11-17 DIAGNOSIS — H6690 Otitis media, unspecified, unspecified ear: Secondary | ICD-10-CM | POA: Diagnosis not present

## 2020-11-17 DIAGNOSIS — L821 Other seborrheic keratosis: Secondary | ICD-10-CM | POA: Diagnosis not present

## 2020-11-17 NOTE — Telephone Encounter (Addendum)
[facility] calling with report for Dr. Rayann Heman. 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   Per [place] Referral to Dr. Donzetta Matters and add Aspirin 81 mg daily.  Left patient a message to call back

## 2020-11-18 MED ORDER — ASPIRIN EC 81 MG PO TBEC: 81.0000 mg | DELAYED_RELEASE_TABLET | Freq: Every day | ORAL | 3 refills | Status: DC

## 2020-11-18 NOTE — Telephone Encounter (Signed)
Spoke to the patient's wife. Gave advisement about referral and starting daily aspirin 81 mg. Verbalized agreement and understanding.

## 2020-11-18 NOTE — Telephone Encounter (Signed)
Follow Up:      Patient's wife is returning Tina's call from yesterday.

## 2020-11-24 DIAGNOSIS — M792 Neuralgia and neuritis, unspecified: Secondary | ICD-10-CM | POA: Diagnosis not present

## 2020-11-24 DIAGNOSIS — I6930 Unspecified sequelae of cerebral infarction: Secondary | ICD-10-CM | POA: Diagnosis not present

## 2020-11-24 DIAGNOSIS — G894 Chronic pain syndrome: Secondary | ICD-10-CM | POA: Diagnosis not present

## 2020-11-25 ENCOUNTER — Other Ambulatory Visit: Payer: Self-pay | Admitting: Cardiology

## 2020-12-08 ENCOUNTER — Other Ambulatory Visit: Payer: Self-pay

## 2020-12-08 ENCOUNTER — Ambulatory Visit: Payer: Medicare HMO | Admitting: Vascular Surgery

## 2020-12-08 ENCOUNTER — Encounter: Payer: Self-pay | Admitting: Vascular Surgery

## 2020-12-08 VITALS — BP 136/86 | HR 69 | Temp 98.3°F | Ht 73.5 in | Wt 210.0 lb

## 2020-12-08 DIAGNOSIS — H9212 Otorrhea, left ear: Secondary | ICD-10-CM | POA: Diagnosis not present

## 2020-12-08 DIAGNOSIS — H7292 Unspecified perforation of tympanic membrane, left ear: Secondary | ICD-10-CM | POA: Diagnosis not present

## 2020-12-08 DIAGNOSIS — I872 Venous insufficiency (chronic) (peripheral): Secondary | ICD-10-CM | POA: Diagnosis not present

## 2020-12-08 DIAGNOSIS — H6121 Impacted cerumen, right ear: Secondary | ICD-10-CM | POA: Diagnosis not present

## 2020-12-08 NOTE — Progress Notes (Signed)
Patient ID: [name], male   DOB: September 17, 1955, 65 y.o.   MRN: [id number]  Reason for Consult: New Patient (Initial Visit)   Referred by [facility]*  Subjective:     HPI:  [name] is a 65 y.o. male with history of atrial fibrillation and carotid artery occlusion.  He is actually a carotid endarterectomy at [facility] for which she is followed there.  More recently he was evaluated with left lower extremity venous DVT study which showed chronic appearing DVT.  He did have a DVT when he was a teenager after an injury.  He has had no further DVTs.  He is on Eliquis he is unsure of the reason possibly because of his carotid disease or previous atrial fibrillation.  He has never had any venous reflux testing.  He has had apparent varicosities for many years has never had any venous treatment of his lower extremities.  He denies any lower extremity discomfort.  He does not wear compression stockings.  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   Nephrolithiasis    Neuropathy    PAF (paroxysmal atrial fibrillation) ([place]) 07/10/2016   s/p afib ablation 02/2017   Typical atrial flutter ([place])    Family History  Problem Relation Age of Onset   Cancer Mother    Heart failure Father    Past Surgical History:  Procedure Laterality Date   ATRIAL FIBRILLATION ABLATION N/A 03/27/2017   Procedure: ATRIAL FIBRILLATION ABLATION;   Surgeon: [name], MD;  Location: [facility];  Service: Cardiovascular;  Laterality: N/A;   LITHOTRIPSY     VASECTOMY      Short Social History:  Social History   Tobacco Use   Smoking status: Never   Smokeless tobacco: Never  Substance Use Topics   Alcohol use: Yes    Alcohol/week: 1.0 standard drink    Types: 1 Cans of beer per week    Comment: occasionally maybe the weekends    No Known Allergies  Current Outpatient Medications  Medication Sig Dispense Refill   acetaminophen (TYLENOL) 500 MG tablet Take 1,000 mg by mouth every 6 (six) hours as needed (for pain/headaches.).     apixaban (ELIQUIS) 5 MG TABS tablet Take 1 tablet by mouth twice daily 180 tablet 1   aspirin EC 81 MG tablet Take 1 tablet (81 mg total) by mouth daily. Swallow whole. 90 tablet 3   atorvastatin (LIPITOR) 40 MG tablet TAKE 1 TABLET DAILY AT 6PM. Please keep upcoming appt in December 2022 with Dr. Radford Pax before anymore refills. Thank you 90 tablet 0   fluticasone (FLONASE) 50 MCG/ACT nasal spray Place 1 spray into both nostrils at bedtime.      LAGEVRIO 200 MG CAPS Take by mouth.     levothyroxine (SYNTHROID, LEVOTHROID) 25 MCG tablet Take 25 mcg by mouth daily before breakfast.      nortriptyline (  PAMELOR) 10 MG capsule TAKE TWO CAPSULES BY MOUTH EVERY NIGHT AT BEDTIME 180 capsule 2   nortriptyline (PAMELOR) 10 MG capsule Take by mouth.     polyethylene glycol (MIRALAX / GLYCOLAX) packet Take 17 g by mouth daily.     pregabalin (LYRICA) 200 MG capsule Take 1 capsule (200 mg total) by mouth 3 (three) times daily. 270 capsule 1   Probiotic Product (PROBIOTIC PO) Take 1 tablet by mouth at bedtime.     propranolol (INDERAL) 10 MG tablet Take 1 tablet (10 mg total) by mouth daily as needed (palpitations). 30 tablet 3   senna (SENOKOT) 8.6 MG tablet Take 2 tablets by mouth 2 (two) times daily. Bedtime     traMADol (ULTRAM) 50 MG tablet TAKE TWO TABLETS BY MOUTH FOUR TIMES A DAY 720 tablet 1   No  current facility-administered medications for this visit.    Review of Systems  Constitutional:  Constitutional negative. HENT: HENT negative.  Eyes: Eyes negative.  Respiratory: Respiratory negative.  Cardiovascular: Positive for leg swelling.  GI: Gastrointestinal negative.  Musculoskeletal: Musculoskeletal negative.  Skin: Skin negative.  Neurological: Neurological negative. Hematologic: Hematologic/lymphatic negative.  Psychiatric: Psychiatric negative.       Objective:  Objective   Vitals:   12/08/20 1423  BP: 136/86  Pulse: 69  Temp: 98.3 F (36.8 C)  SpO2: 97%  Weight: 210 lb (95.3 kg)  Height: 6' 1.5" (1.867 m)   Body mass index is 27.33 kg/m.  Physical Exam HENT:     Head: Normocephalic.     Nose:     Comments: Wearing a mask Eyes:     Pupils: Pupils are equal, round, and reactive to light.  Cardiovascular:     Rate and Rhythm: Normal rate.     Pulses: Normal pulses.  Pulmonary:     Effort: Pulmonary effort is normal.  Abdominal:     General: Abdomen is flat.     Palpations: Abdomen is soft.  Musculoskeletal:     Cervical back: Normal range of motion and neck supple.     Right lower leg: No edema.     Left lower leg: Edema present.     Comments: There are areas of varicosities on the medial left leg overlying the greater saphenous vein distribution  Skin:    General: Skin is warm and dry.     Capillary Refill: Capillary refill takes less than 2 seconds.  Neurological:     General: No focal deficit present.     Mental Status: He is alert. Mental status is at baseline. He is disoriented.    Data: Study Result   DVT Study -------+---------------+---------+-----------+----------+--------------+   RIGHT    CompressibilityPhasicitySpontaneityPropertiesThrombus  Aging  +---------+---------------+---------+-----------+----------+--------------+   CFV      Full           Yes      Yes                                     +---------+---------------+---------+-----------+----------+--------------+   SFJ      Full           Yes      Yes                                    +---------+---------------+---------+-----------+----------+--------------+   FV Prox  Full  Yes      Yes                                    +---------+---------------+---------+-----------+----------+--------------+   FV Mid   Full           Yes      Yes                                    +---------+---------------+---------+-----------+----------+--------------+   FV DistalFull           Yes      Yes                                    +---------+---------------+---------+-----------+----------+--------------+   PFV      Full                                                           +---------+---------------+---------+-----------+----------+--------------+   POP      Full           Yes      Yes                                    +---------+---------------+---------+-----------+----------+--------------+   PTV      Full           Yes      Yes                                    +---------+---------------+---------+-----------+----------+--------------+   PERO     Full           Yes      Yes                                    +---------+---------------+---------+-----------+----------+--------------+   Gastroc  Full                                                           +---------+---------------+---------+-----------+----------+--------------+   GSV      Full           Yes      Yes                                    +---------+---------------+---------+-----------+----------+--------------+            +---------+---------------+---------+-----------+---------------+----------  ----+  LEFT     CompressibilityPhasicitySpontaneityProperties     Thrombus  Aging   +---------+---------------+---------+-----------+---------------+----------  ----+  CFV      Full           Yes      Yes                                         +---------+---------------+---------+-----------+---------------+----------  ----+  SFJ      Full           Yes      Yes                                         +---------+---------------+---------+-----------+---------------+----------  ----+  FV Prox  Partial        Yes      Yes        with striationsChronic          +---------+---------------+---------+-----------+---------------+----------  ----+  FV Mid   Full           Yes      Yes                                         +---------+---------------+---------+-----------+---------------+----------  ----+  FV DistalPartial        Yes      Yes        with striationsChronic          +---------+---------------+---------+-----------+---------------+----------  ----+  PFV      Full                                                                +---------+---------------+---------+-----------+---------------+----------  ----+  POP      Partial        Yes      Yes        with striationsChronic          +---------+---------------+---------+-----------+---------------+----------  ----+     Summary:  RIGHT:  - No evidence of deep vein thrombosis in the lower extremity. No indirect  evidence of obstruction proximal to the inguinal ligament.  - No cystic structure found in the popliteal fossa.     LEFT:  - Findings consistent with chronic deep vein thrombosis involving the left  femoral vein, and left popliteal vein.  - No cystic structure found in the popliteal fossa.           Assessment/Plan:     65 year old male with left lower extremity C3 venous disease.  He has evidence of chronic DVT from a DVT that occurred approximately 50 years ago.  He likely has venous reflux disease given his varicosities and a saphenous  vein distribution on the medial aspect of the leg as well as swelling that is isolated to the ankle with evidence of telangiectasias there as well projecting onto the foot.  These are all isolated to the left lower extremity.  I discussed with the patient the natural history of the venous disease but he is free of symptoms at this time not interested in any treatment.  I recommended compression stockings as tolerated.  He can follow-up with me on an as-needed basis     Waynetta Sandy MD [facility]

## 2020-12-28 ENCOUNTER — Ambulatory Visit: Payer: Medicare HMO | Admitting: Cardiology

## 2021-01-04 ENCOUNTER — Ambulatory Visit (HOSPITAL_COMMUNITY): Payer: Medicare HMO | Attending: Cardiology

## 2021-01-04 ENCOUNTER — Other Ambulatory Visit: Payer: Self-pay

## 2021-01-04 DIAGNOSIS — I480 Paroxysmal atrial fibrillation: Secondary | ICD-10-CM | POA: Diagnosis not present

## 2021-01-04 DIAGNOSIS — I77810 Thoracic aortic ectasia: Secondary | ICD-10-CM | POA: Insufficient documentation

## 2021-01-04 DIAGNOSIS — I358 Other nonrheumatic aortic valve disorders: Secondary | ICD-10-CM | POA: Diagnosis not present

## 2021-01-04 DIAGNOSIS — R001 Bradycardia, unspecified: Secondary | ICD-10-CM | POA: Insufficient documentation

## 2021-01-04 LAB — ECHOCARDIOGRAM COMPLETE
AR max vel: 3.76 cm2
AV Area VTI: 3.72 cm2
AV Area mean vel: 3.45 cm2
AV Mean grad: 4 mmHg
AV Peak grad: 7.7 mmHg
Ao pk vel: 1.39 m/s
Area-P 1/2: 3.6 cm2
S' Lateral: 2.7 cm

## 2021-01-05 ENCOUNTER — Encounter: Payer: Self-pay | Admitting: Cardiology

## 2021-01-07 ENCOUNTER — Telehealth: Payer: Self-pay | Admitting: Cardiology

## 2021-01-07 DIAGNOSIS — I77810 Thoracic aortic ectasia: Secondary | ICD-10-CM

## 2021-01-07 NOTE — Telephone Encounter (Signed)
Pt returning phone call for results... please advise °

## 2021-01-07 NOTE — Telephone Encounter (Signed)
Matthew Margarita, MD  01/05/2021 11:58 AM EST     Echo showed normal LV function with trivial leakiness of the mitral valve normal for his age and mildly thickened aortic valve normal for his age.  There is mild enlargement of the ascending aorta at 38 mm and aortic arch at 40 mm.  This is slightly increased from his chest CTA in July 2020.  Please repeat a chest CTA gated with contrast to reassess aortic enlargement   The patient's wife has been notified of the result and verbalized understanding.  All questions (if any) were answered. Antonieta Iba, RN 01/07/2021 11:32 AM

## 2021-01-26 ENCOUNTER — Other Ambulatory Visit: Payer: Medicare HMO | Admitting: *Deleted

## 2021-01-26 ENCOUNTER — Other Ambulatory Visit: Payer: Self-pay

## 2021-01-26 DIAGNOSIS — I77810 Thoracic aortic ectasia: Secondary | ICD-10-CM | POA: Diagnosis not present

## 2021-01-27 LAB — BASIC METABOLIC PANEL
BUN/Creatinine Ratio: 11 (ref 10–24)
BUN: 10 mg/dL (ref 8–27)
CO2: 26 mmol/L (ref 20–29)
Calcium: 8.9 mg/dL (ref 8.6–10.2)
Chloride: 108 mmol/L — ABNORMAL HIGH (ref 96–106)
Creatinine, Ser: 0.88 mg/dL (ref 0.76–1.27)
Glucose: 74 mg/dL (ref 70–99)
Potassium: 5 mmol/L (ref 3.5–5.2)
Sodium: 145 mmol/L — ABNORMAL HIGH (ref 134–144)
eGFR: 94 mL/min/{1.73_m2} (ref >59)

## 2021-02-02 ENCOUNTER — Ambulatory Visit (HOSPITAL_COMMUNITY)
Admission: RE | Admit: 2021-02-02 | Discharge: 2021-02-02 | Disposition: A | Discharge status: Home / Self Care | Payer: Medicare HMO | Source: Ambulatory Visit | Attending: Cardiology | Admitting: Cardiology

## 2021-02-02 ENCOUNTER — Other Ambulatory Visit: Payer: Self-pay

## 2021-02-02 DIAGNOSIS — I77810 Thoracic aortic ectasia: Secondary | ICD-10-CM | POA: Diagnosis not present

## 2021-02-02 DIAGNOSIS — R911 Solitary pulmonary nodule: Secondary | ICD-10-CM | POA: Diagnosis not present

## 2021-02-02 MED ORDER — IOHEXOL 350 MG/ML SOLN
100.0000 mL | Freq: Once | INTRAVENOUS | Status: AC | PRN
  Administered 2021-02-02: 100 mL via INTRAVENOUS

## 2021-02-07 ENCOUNTER — Ambulatory Visit: Payer: Medicare HMO | Admitting: Cardiology

## 2021-02-09 ENCOUNTER — Other Ambulatory Visit: Payer: Self-pay | Admitting: Cardiology

## 2021-02-09 DIAGNOSIS — R7303 Prediabetes: Secondary | ICD-10-CM | POA: Diagnosis not present

## 2021-02-09 DIAGNOSIS — G8929 Other chronic pain: Secondary | ICD-10-CM | POA: Diagnosis not present

## 2021-02-09 DIAGNOSIS — I480 Paroxysmal atrial fibrillation: Secondary | ICD-10-CM | POA: Diagnosis not present

## 2021-02-09 DIAGNOSIS — E782 Mixed hyperlipidemia: Secondary | ICD-10-CM | POA: Diagnosis not present

## 2021-02-09 DIAGNOSIS — I4891 Unspecified atrial fibrillation: Secondary | ICD-10-CM | POA: Diagnosis not present

## 2021-02-09 DIAGNOSIS — I639 Cerebral infarction, unspecified: Secondary | ICD-10-CM | POA: Diagnosis not present

## 2021-02-09 DIAGNOSIS — E039 Hypothyroidism, unspecified: Secondary | ICD-10-CM | POA: Diagnosis not present

## 2021-02-09 DIAGNOSIS — Z125 Encounter for screening for malignant neoplasm of prostate: Secondary | ICD-10-CM | POA: Diagnosis not present

## 2021-02-09 DIAGNOSIS — Z8673 Personal history of transient ischemic attack (TIA), and cerebral infarction without residual deficits: Secondary | ICD-10-CM | POA: Diagnosis not present

## 2021-02-09 DIAGNOSIS — Z0000 Encounter for general adult medical examination without abnormal findings: Secondary | ICD-10-CM | POA: Diagnosis not present

## 2021-02-25 ENCOUNTER — Other Ambulatory Visit: Payer: Self-pay | Admitting: Internal Medicine

## 2021-02-25 NOTE — Telephone Encounter (Signed)
Eliquis 5 mg refill request received. Patient is 65 years old, weight- 95.3 kg, Crea- 0.88 on 01/26/21, Diagnosis- PAF, and last seen by Dr. Rayann Heman on 11/15/20. Dose is appropriate based on dosing criteria. Will send in refill to requested pharmacy.

## 2021-03-16 DIAGNOSIS — D6869 Other thrombophilia: Secondary | ICD-10-CM | POA: Diagnosis not present

## 2021-03-16 DIAGNOSIS — Z8673 Personal history of transient ischemic attack (TIA), and cerebral infarction without residual deficits: Secondary | ICD-10-CM | POA: Diagnosis not present

## 2021-03-16 DIAGNOSIS — G8929 Other chronic pain: Secondary | ICD-10-CM | POA: Diagnosis not present

## 2021-03-30 ENCOUNTER — Encounter: Payer: Medicare Other | Attending: Registered Nurse | Admitting: Registered Nurse

## 2021-03-30 ENCOUNTER — Other Ambulatory Visit: Payer: Self-pay

## 2021-03-30 ENCOUNTER — Ambulatory Visit
Admission: RE | Admit: 2021-03-30 | Discharge: 2021-03-30 | Disposition: A | Discharge status: Home / Self Care | Payer: Medicare Other | Source: Ambulatory Visit | Attending: Registered Nurse | Admitting: Registered Nurse

## 2021-03-30 VITALS — BP 123/79 | HR 69 | Ht 73.5 in | Wt 209.0 lb

## 2021-03-30 DIAGNOSIS — Z79891 Long term (current) use of opiate analgesic: Secondary | ICD-10-CM | POA: Insufficient documentation

## 2021-03-30 DIAGNOSIS — Z5181 Encounter for therapeutic drug level monitoring: Secondary | ICD-10-CM | POA: Insufficient documentation

## 2021-03-30 DIAGNOSIS — G890 Central pain syndrome: Secondary | ICD-10-CM | POA: Diagnosis not present

## 2021-03-30 DIAGNOSIS — M1611 Unilateral primary osteoarthritis, right hip: Secondary | ICD-10-CM | POA: Diagnosis not present

## 2021-03-30 DIAGNOSIS — M25551 Pain in right hip: Secondary | ICD-10-CM | POA: Insufficient documentation

## 2021-03-30 DIAGNOSIS — G8929 Other chronic pain: Secondary | ICD-10-CM | POA: Insufficient documentation

## 2021-03-30 DIAGNOSIS — G894 Chronic pain syndrome: Secondary | ICD-10-CM | POA: Diagnosis not present

## 2021-03-30 DIAGNOSIS — R4701 Aphasia: Secondary | ICD-10-CM

## 2021-03-30 MED ORDER — PREGABALIN 200 MG PO CAPS: 200.0000 mg | ORAL_CAPSULE | Freq: Three times a day (TID) | ORAL | 1 refills | Status: DC

## 2021-03-30 MED ORDER — NORTRIPTYLINE HCL 10 MG PO CAPS: 10.0000 mg | ORAL_CAPSULE | Freq: Every day | ORAL | 1 refills | Status: DC

## 2021-03-30 MED ORDER — TRAMADOL HCL 50 MG PO TABS: ORAL_TABLET | ORAL | 1 refills | Status: DC

## 2021-03-30 NOTE — Progress Notes (Signed)
Subjective:    Patient ID: [name], male    DOB: Jan 24, 1956, 66 y.o.   MRN: [id number]  HPI: [name] is a 66 y.o. male who returns for follow up appointment for chronic pain and medication refill. He states his pain is located in his right hip radiating into his right lower extremity and right foot with tingling and burning. He rates his 8 pain . His current exercise regime is walking.  [name] reports they went to [facility] for evaluation of Ketamine Injections, they were advised not to proceed due to [name]. This information will be given to [name], will awit feed back, she verbalizes understanding.   [name] Morphine equivalent is 34.67 MME.   UDS ordered today.  Pain Inventory Average Pain 8 Pain Right Now 8 My pain is constant, burning, dull, tingling, and aching  In the last 24 hours, has pain interfered with the following? General activity 7 Relation with others 5 Enjoyment of life 7 What TIME of day is your pain at its worst? varies Sleep (in general) Fair  Pain is worse with:  constant Pain improves with: medication Relief from Meds: 3  Family History  Problem Relation Age of Onset   Cancer Mother    Heart failure Father    Social History   Socioeconomic History   Marital status: Married    Spouse name: Not on file   Number of children: Not on file   Years of education: Not on file   Highest education level: Not on file  Occupational History   Not on file  Tobacco Use   Smoking status: Never   Smokeless tobacco: Never  Vaping Use   Vaping Use: Never used  Substance and Sexual Activity   Alcohol use: Yes    Alcohol/week: 1.0 standard drink    Types: 1 Cans of beer per week    Comment: occasionally maybe the weekends   Drug use: No   Sexual activity: Not on file  Other Topics Concern   Not on file  Social History Narrative   Not on file   Social Determinants of Health   Financial Resource Strain: Not  on file  Food Insecurity: Not on file  Transportation Needs: Not on file  Physical Activity: Not on file  Stress: Not on file  Social Connections: Not on file   Past Surgical History:  Procedure Laterality Date   ATRIAL FIBRILLATION ABLATION N/A 03/27/2017   Procedure: [place];  Surgeon: [name], MD;  Location: [facility];  Service: Cardiovascular;  Laterality: N/A;   LITHOTRIPSY     VASECTOMY     Past Surgical History:  Procedure Laterality Date   ATRIAL FIBRILLATION ABLATION N/A 03/27/2017   Procedure: ATRIAL FIBRILLATION ABLATION;  Surgeon: [name], MD;  Location: [facility];  Service: Cardiovascular;  Laterality: N/A;   LITHOTRIPSY     VASECTOMY     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   Ascending aorta dilatation ([place]) 10/20/2015   38 mm by echo 12/2020   Basal cell carcinoma    Bradycardia 10/21/2015   Contusion of calf, left, initial encounter 12/03/2015   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   Left post branch MCA infarct hasRLE>> RUE symptoms    Dilated aortic root ([facility])    40 mm by echo 12/2020   Hyperlipidemia LDL goal <70 05/31/2016  ICAO (internal Glass artery occlusion), left 07/10/2016   chronically occluded left ICA and s/p right CEA at [facility]   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   Nephrolithiasis    Neuropathy    PAF (paroxysmal atrial fibrillation) ([place]) 07/10/2016   s/p afib ablation 02/2017   Typical atrial flutter ([facility])    BP 123/79    Pulse 69    Ht 6' 1.5" (1.867 m)    Wt 209 lb (94.8 kg)    SpO2 98%    BMI 27.20 kg/m   Opioid Risk Score:   Fall Risk Score:  `1  Depression screen PHQ 2/9  Depression screen [facility] 2/9 03/30/2021 09/30/2020 10/07/2019 12/29/2016 06/04/2015 10/23/2014  Decreased Interest 0 0 0 0 0 0  Down, Depressed, Hopeless 0 0 0 0 0 0  PHQ - 2 Score 0 0 0 0 0 0  Altered sleeping - - - - - 3   Tired, decreased energy - - - - - 3  Change in appetite - - - - - 0  Feeling bad or failure about yourself  - - - - - 0  Trouble concentrating - - - - - 0  Moving slowly or fidgety/restless - - - - - 0  Suicidal thoughts - - - - - 0  PHQ-9 Score - - - - - 6  Difficult doing work/chores - - - - - Not difficult at all      Review of Systems  Musculoskeletal:        Pain down right leg  All other systems reviewed and are negative.     Objective:   Physical Exam Vitals and nursing note reviewed.  Constitutional:      Appearance: Normal appearance.  Cardiovascular:     Rate and Rhythm: Normal rate and regular rhythm.     Pulses: Normal pulses.     Heart sounds: Normal heart sounds.  Pulmonary:     Effort: Pulmonary effort is normal.     Breath sounds: Normal breath sounds.  Musculoskeletal:     Cervical back: Normal range of motion and neck supple.     Comments: Normal Muscle Bulk and Muscle Testing Reveals:  Upper Extremities: Full ROM and Muscle Strength 5/5 Right Greater Trochanter Tenderness  Lower Extremities: Full ROM and Muscle Strength 5/5 Arises from Table with ease  Narrow Based  Gait     Skin:    General: Skin is warm and dry.  Neurological:     Mental Status: He is alert and oriented to person, place, and time.  Psychiatric:        Mood and Affect: Mood normal.        Behavior: Behavior normal.         Assessment & Plan:  1. 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 #720 90 day supply and  Lyrica 200 mg one capsule three times a day #270. 03/30/2021 2. Chronic Pain: Continue Tramadol and Lyrica. Continue to Monitor.  We will continue the opioid monitoring program, this consists of regular clinic visits, examinations, urine drug screen, pill counts as well as use of [place] Controlled Substance Reporting system. A 12 month History has been reviewed on the [place] Controlled Substance Reporting System on 02/01/20223 3. Chronic Right Hip Pain: He reports increase intensity and frequency of pain: RX: X-Ray.   F/U in 6 months

## 2021-04-01 ENCOUNTER — Encounter: Payer: Self-pay | Admitting: Registered Nurse

## 2021-04-04 LAB — TOXASSURE SELECT,+ANTIDEPR,UR

## 2021-04-05 ENCOUNTER — Telehealth: Payer: Self-pay | Admitting: *Deleted

## 2021-04-05 NOTE — Telephone Encounter (Signed)
Urine drug screen for this encounter is consistent for prescribed medication 

## 2021-04-13 DIAGNOSIS — E039 Hypothyroidism, unspecified: Secondary | ICD-10-CM | POA: Diagnosis not present

## 2021-04-27 ENCOUNTER — Telehealth: Payer: Self-pay | Admitting: *Deleted

## 2021-04-27 NOTE — Telephone Encounter (Signed)
[name] called about results of [name]'s x ray he had done on his hip. ?

## 2021-04-28 DIAGNOSIS — H40013 Open angle with borderline findings, low risk, bilateral: Secondary | ICD-10-CM | POA: Diagnosis not present

## 2021-04-28 DIAGNOSIS — H25813 Combined forms of age-related cataract, bilateral: Secondary | ICD-10-CM | POA: Diagnosis not present

## 2021-04-28 DIAGNOSIS — H04123 Dry eye syndrome of bilateral lacrimal glands: Secondary | ICD-10-CM | POA: Diagnosis not present

## 2021-04-28 DIAGNOSIS — H53461 Homonymous bilateral field defects, right side: Secondary | ICD-10-CM | POA: Diagnosis not present

## 2021-04-28 NOTE — Telephone Encounter (Signed)
Return [name] call, reviewed X-Ray results with her. She asked about cortisone injection, she states [name] complaining of right hip pain. Message will be sent to [name], she verbalizes undertsanding.  ?Also reports [name] complaing of bilateral feet pain, he is currently on Lyrica and nortriptyline, she stated when he was on higher dose of nortriptyline he was drowsy. Medications will remain the same. She verbalizes understanding.  ?

## 2021-04-29 ENCOUNTER — Telehealth: Payer: Self-pay | Admitting: Registered Nurse

## 2021-04-29 NOTE — Telephone Encounter (Signed)
Discussed with Dr [name] results  and Mr. Chretien complaints. He will be scheduled with [name].  ?Placed a call to Mrs. Ovitt regarding the above, she verbalizes understanding.  ?

## 2021-05-02 ENCOUNTER — Ambulatory Visit: Payer: Medicare HMO | Admitting: Cardiology

## 2021-05-03 ENCOUNTER — Other Ambulatory Visit: Payer: Self-pay

## 2021-05-03 ENCOUNTER — Encounter: Payer: Medicare Other | Attending: Registered Nurse | Admitting: Physical Medicine & Rehabilitation

## 2021-05-03 ENCOUNTER — Encounter: Payer: Self-pay | Admitting: Physical Medicine & Rehabilitation

## 2021-05-03 VITALS — BP 124/77 | HR 64 | Temp 97.7°F | Ht 73.5 in | Wt 211.0 lb

## 2021-05-03 DIAGNOSIS — M7061 Trochanteric bursitis, right hip: Secondary | ICD-10-CM | POA: Diagnosis not present

## 2021-05-03 DIAGNOSIS — G890 Central pain syndrome: Secondary | ICD-10-CM | POA: Insufficient documentation

## 2021-05-03 NOTE — Progress Notes (Signed)
? ?Subjective:  ? ? Patient ID: [name], male    DOB: 1955-10-20, 66 y.o.   MRN: [id number] ? ?HPI ? ?Pain Inventory ?Average Pain 8 ?Pain Right Now 7 ?My pain is constant, burning, dull, tingling, and aching ? ?In the last 24 hours, has pain interfered with the following? ?General activity 7 ?Relation with others 5 ?Enjoyment of life 7 ?What TIME of day is your pain at its worst? morning , daytime, evening, and night ?Sleep (in general) Fair ? ?Pain is worse with: walking ?Pain improves with: rest and medication ?Relief from Meds: 3 ? ?Family History  ?Problem Relation Age of Onset  ? Cancer Mother   ? Heart failure Father   ? ?Social History  ? ?Socioeconomic History  ? Marital status: Married  ?  Spouse name: Not on file  ? Number of children: Not on file  ? Years of education: Not on file  ? Highest education level: Not on file  ?Occupational History  ? Not on file  ?Tobacco Use  ? Smoking status: Never  ? Smokeless tobacco: Never  ?Vaping Use  ? Vaping Use: Never used  ?Substance and Sexual Activity  ? Alcohol use: Yes  ?  Alcohol/week: 1.0 standard drink  ?  Types: 1 Cans of beer per week  ?  Comment: occasionally maybe the weekends  ? Drug use: No  ? Sexual activity: Not on file  ?Other Topics Concern  ? Not on file  ?Social History Narrative  ? Not on file  ? ?Social Determinants of Health  ? ?Financial Resource Strain: Not on file  ?Food Insecurity: Not on file  ?Transportation Needs: Not on file  ?Physical Activity: Not on file  ?Stress: Not on file  ?Social Connections: Not on file  ? ?Past Surgical History:  ?Procedure Laterality Date  ? ATRIAL FIBRILLATION ABLATION N/A 03/27/2017  ? Procedure: ATRIAL FIBRILLATION ABLATION;  Surgeon: [name], MD;  Location: [place] [facility];  Service: Cardiovascular;  Laterality: N/A;  ? LITHOTRIPSY    ? VASECTOMY    ? ?Past Surgical History:  ?Procedure Laterality Date  ? ATRIAL FIBRILLATION ABLATION N/A 03/27/2017  ? Procedure: ATRIAL FIBRILLATION  ABLATION;  Surgeon: [name], MD;  Location: [facility];  Service: Cardiovascular;  Laterality: N/A;  ? LITHOTRIPSY    ? VASECTOMY    ? ?Past Medical History:  ?Diagnosis Date  ? Altered sensation due to stroke 12/10/2014  ? 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  ? 38 mm by echo 12/2020  ? Basal cell carcinoma   ? Bradycardia 10/21/2015  ? Contusion of calf, left, initial encounter 12/03/2015  ? 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 Left post branch MCA infarct hasRLE>> RUE symptoms   ? Dilated aortic root ([place])   ? 40 mm by echo 12/2020  ? Hyperlipidemia LDL goal <70 05/31/2016  ? ICAO (internal carotid artery occlusion), left 07/10/2016  ? chronically occluded left ICA and s/p right CEA at [facility]  ? 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 Nephrolithiasis   ? Neuropathy   ? PAF (paroxysmal atrial fibrillation) ([place]) 07/10/2016  ? s/p afib ablation 02/2017  ? Typical atrial flutter ([place])   ? ?BP 124/77   Pulse 64   Temp 97.7 ?F (36.5 ?C)   Ht 6' 1.5" (1.867 m)   Wt 211 lb (95.7 kg)   SpO2 95%   BMI 27.46 kg/m?  ? ?Opioid Risk Score:   ?  Fall Risk Score:  `1 ? ?Depression screen PHQ 2/9 ? ?Depression screen [facility] 2/9 05/03/2021 03/30/2021 09/30/2020 10/07/2019 12/29/2016 06/04/2015 10/23/2014  ?Decreased Interest 0 0 0 0 0 0 0  ?Down, Depressed, Hopeless 0 0 0 0 0 0 0  ?PHQ - 2 Score 0 0 0 0 0 0 0  ?Altered sleeping - - - - - - 3  ?Tired, decreased energy - - - - - - 3  ?Change in appetite - - - - - - 0  ?Feeling bad or failure about yourself  - - - - - - 0  ?Trouble concentrating - - - - - - 0  ?Moving slowly or fidgety/restless - - - - - - 0  ?Suicidal thoughts - - - - - - 0  ?PHQ-9 Score - - - - - - 6  ?Difficult doing work/chores - - - - - - Not difficult at all  ?  ? ?Review of Systems  ?Constitutional: Negative.   ?HENT: Negative.    ?Eyes: Negative.   ?Respiratory: Negative.    ?Cardiovascular: Negative.   ?Gastrointestinal: Negative.   ?Endocrine:  Negative.   ?Genitourinary: Negative.   ?Musculoskeletal:  Positive for arthralgias and gait problem.  ?Skin: Negative.   ?Allergic/Immunologic: Negative.   ?Hematological: Negative.   ?Psychiatric/Behavioral: Negative.    ?All other systems reviewed and are negative. ? ?   ?Objective:  ? Physical Exam ?Vitals and nursing note reviewed.  ?Constitutional:   ?   Appearance: He is normal weight.  ?HENT:  ?   Head: Normocephalic and atraumatic.  ?Eyes:  ?   Extraocular Movements: Extraocular movements intact.  ?   Conjunctiva/sclera: Conjunctivae normal.  ?   Pupils: Pupils are equal, round, and reactive to light.  ?Musculoskeletal:  ?   Right lower leg: No edema.  ?Skin: ?   General: Skin is warm and dry.  ?Neurological:  ?   Mental Status: He is alert and oriented to person, place, and time. Mental status is at baseline.  ?Psychiatric:     ?   Mood and Affect: Mood normal.     ?   Behavior: Behavior normal.  ? ? ?Tenderness to palpation over the right greater trochanter but not over the left. ?Sensation difficult to assess secondary to moderately severe aphasia ?MSK left lower extremity no evidence of joint swelling or pain with toe or foot range of motion no pain with ankle range of motion no erythema ?No evidence of intrinsic muscle atrophy ? ?   ?Assessment & Plan:  ? ? Burning pain in bilateral feet, hx of post stroke central pain mainly affecting the RLE, the pt has difficulty with verbal expression due to CVA making history difficult to obtain, vacular US venous duplex in Sept unremarkable, had Lumbar MRI in 2016 showing no stenosis, no hx of diabetes or excess alcohol consumption, will check EMG/NCV to eval for peripheral neuropathy ? ? ? ?2.  Right trochanteric bursitis, not a good candidate for nonsteroidal anti-inflammatory secondary to CVA history, have given some exercises.  His pain does interfere with sleep therefore we will do injection today ? ?Right trochanteric bursa injection ?With or without  ultrasound guidance ? ?Indication ?Trochanteric bursitis. Exam has tenderness over the greater trochanter of the hip. Pain has not responded to conservative care such as exercise therapy and oral medications. Pain interferes with sleep or with mobility ?Informed consent was obtained after describing risks and benefits of the procedure with the patient these include bleeding bruising  and infection. Patient has signed written consent form. Patient placed in a lateral decubitus position with the affected hip superior. Point of maximal pain was palpated marked and prepped with Betadine and entered with a needle to bone contact. Needle slightly withdrawn then '6mg'$  of betamethasone with 4 cc 1% lidocaine were injected. Patient tolerated procedure well. Post procedure instructions given. ? ?

## 2021-05-03 NOTE — Patient Instructions (Signed)

## 2021-05-05 DIAGNOSIS — I6522 Occlusion and stenosis of left carotid artery: Secondary | ICD-10-CM | POA: Diagnosis not present

## 2021-05-05 DIAGNOSIS — I69351 Hemiplegia and hemiparesis following cerebral infarction affecting right dominant side: Secondary | ICD-10-CM | POA: Diagnosis not present

## 2021-05-05 DIAGNOSIS — R4701 Aphasia: Secondary | ICD-10-CM | POA: Diagnosis not present

## 2021-05-05 DIAGNOSIS — I69320 Aphasia following cerebral infarction: Secondary | ICD-10-CM | POA: Diagnosis not present

## 2021-05-05 DIAGNOSIS — Z8673 Personal history of transient ischemic attack (TIA), and cerebral infarction without residual deficits: Secondary | ICD-10-CM | POA: Diagnosis not present

## 2021-05-05 DIAGNOSIS — Z7901 Long term (current) use of anticoagulants: Secondary | ICD-10-CM | POA: Diagnosis not present

## 2021-05-05 DIAGNOSIS — R970 Elevated carcinoembryonic antigen [CEA]: Secondary | ICD-10-CM | POA: Diagnosis not present

## 2021-05-16 ENCOUNTER — Other Ambulatory Visit: Payer: Self-pay | Admitting: Internal Medicine

## 2021-05-16 NOTE — Telephone Encounter (Signed)
Pt last saw Dr Rayann Heman 11/15/20, last labs 01/26/21 Creat 0.88, age 66, weight 95.7kg, based on specified criteria pt is on appropriate dosage of Eliquis '5mg'$  BID for afib.  Will refill rx.  ?

## 2021-05-31 ENCOUNTER — Other Ambulatory Visit: Payer: Self-pay

## 2021-05-31 MED ORDER — ATORVASTATIN CALCIUM 40 MG PO TABS: ORAL_TABLET | ORAL | 0 refills | Status: DC

## 2021-05-31 NOTE — Telephone Encounter (Signed)
Pt's medication was sent to pt's pharmacy as requested. Confirmation received.  °

## 2021-06-07 ENCOUNTER — Telehealth: Payer: Self-pay | Admitting: Cardiology

## 2021-06-07 NOTE — Telephone Encounter (Signed)
Returned call to wife. ? ?Pt would like to know if he is able to discontinue Eliquis. ? ?Advised would discuss with Dr. Rayann Heman and advise. ? ? ?

## 2021-06-07 NOTE — Telephone Encounter (Signed)
Pt c/o medication issue: ? ?1. Name of Medication:  ? apixaban (ELIQUIS) 5 MG TABS tablet  ? ? ?2. How are you currently taking this medication (dosage and times per day)?  ?Take 1 tablet by mouth twice daily ?3. Are you having a reaction (difficulty breathing--STAT)? No ? ?4. What is your medication issue?  Pt's wife states that she as well as 2 other providers are wanting to know why the pt is on the above medication. Please advise ? ?

## 2021-06-07 NOTE — Telephone Encounter (Signed)
Left message for patient's wife to call back.  

## 2021-06-12 NOTE — Telephone Encounter (Signed)
Given chronic DVT as well as history of afib with chads2vasc score of at least 4, I would advise that he continue anticoagulation unless contraindicated. ?

## 2021-06-13 NOTE — Telephone Encounter (Signed)
Returned call to Pt's wife. ? ? ?Advised per Dr. Hosie Poisson should continue Eliquis. ? ?Wife indicates understanding.  She will call if she needs a refill. ?

## 2021-06-25 NOTE — Progress Notes (Signed)
?Cardiology Office Note:   ? ?Date:  06/27/2021  ? ?ID:  [name], DOB 01/25/1956, MRN [id number] ? ?PCP:  [facility] [place] ?  ?[facility] Providers ?Cardiologist:  Fransico Him, MD ?Electrophysiologist:  [name], MD    ? ?Referring MD: [facility]  ? ?Chief Complaint: annual follow-up hyperlipidemia, ascending aortic aneurysm ? ?History of Present Illness:   ? ?[name] is a very pleasant 66 y.o. male with a hx of stroke, carotid artery stenosis, persistent atrial fibrillation s/p ablation on chronic anticoagulation, dilated aortic root, and hyperlipidemia ? ?He has a history of carotid artery disease s/p R CEA ([facility]), chronically occluded L ICA, followed by vascular surgery at [facility].  Calcium score of 0 by cardiac CT 02/2017. Myoview 05/2014, no ischemia.  ? ?Last visit with general cardiology on 10/13/19 with Richardson Dopp, PA at which time routine EP follow-up recommended as previously scheduled. He was last seen in our office by Dr. Rayann Heman  for EP follow-up on 11/15/20.  He was having worsening leg edema following a flight to [place]. LE ultrasound was ordered and what appeared to be a chronic DVT was found and left leg.  Patient was advised to start aspirin 81 mg daily in addition to Eliquis and was referred to [facility]. Echo 12/2020 revealed normal LV function with trivial leakiness of mitral valve, mildly thickened aortic valve, mild enlargement of ascending aorta at 38 mm and aortic arch at 40 mm.  This is slightly increased from chest CTA in July 2020.  CTA 02/02/21 was ordered for further evaluation and revealed mildly dilated aorta at 38 mm, slight increase from the previous year at 37 mm.  Annual imaging recommended. ? ?He called our office on 06/07/2021 with request to discontinue Eliquis.  He was advised by Dr. Rayann Heman that with history atrial fibrillation with CHA2DS2-VASc score of 4 and chronic DVT he should continue anticoagulation. ? ?Today, he is  here with his wife for follow-up.  He has some difficulty with verbal expression due to stroke. Reports he is walking for 40 mins a few times per week and doing some light weight lifting and stretching.  We discussed the message from Dr. Rayann Heman regarding continuing Eliquis and he verbalizes agreement. He denies chest pain, shortness of breath, lower extremity edema, fatigue, palpitations, melena, hematuria, hemoptysis, diaphoresis, weakness, presyncope, syncope, orthopnea, and PND. ? ? ?Past Medical History:  ?Diagnosis Date  ? Altered sensation due to stroke 12/10/2014  ? 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  ? 38 mm by echo 12/2020  ? Basal cell carcinoma   ? Bradycardia 10/21/2015  ? Contusion of calf, left, initial encounter 12/03/2015  ? 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 Left post branch MCA infarct hasRLE>> RUE symptoms   ? Dilated aortic root ([place])   ? 40 mm by echo 12/2020  ? Hyperlipidemia LDL goal <70 05/31/2016  ? ICAO (internal carotid artery occlusion), left 07/10/2016  ? chronically occluded left ICA and s/p right [facility] at [facility]  ? 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 Nephrolithiasis   ? Neuropathy   ? PAF (paroxysmal atrial fibrillation) ([place]) 07/10/2016  ? s/p afib ablation 02/2017  ? Typical atrial flutter ([place])   ? ? ?Past Surgical History:  ?Procedure Laterality Date  ? ATRIAL FIBRILLATION ABLATION N/A 03/27/2017  ? Procedure: ATRIAL FIBRILLATION ABLATION;  Surgeon: [name], MD;  Location: [place] [facility];  Service:  Cardiovascular;  Laterality: N/A;  ? LITHOTRIPSY    ? VASECTOMY    ? ? ?Current Medications: ?Current Meds  ?Medication Sig  ? acetaminophen (TYLENOL) 500 MG tablet Take 1,000 mg by mouth every 6 (six) hours as needed (for pain/headaches.).  ? apixaban (ELIQUIS) 5 MG TABS tablet Take 1 tablet by mouth twice daily  ? atorvastatin (LIPITOR) 40 MG tablet TAKE 1 TABLET DAILY AT 6PM. Please keep upcoming appt in May 2023  with Dr. Radford Pax before anymore refills. Thank you  ? fluticasone (FLONASE) 50 MCG/ACT nasal spray Place 1 spray into both nostrils at bedtime.   ? levothyroxine (SYNTHROID, LEVOTHROID) 25 MCG tablet Take 50 mcg by mouth daily before breakfast.  ? nortriptyline (PAMELOR) 10 MG capsule Take 1 capsule (10 mg total) by mouth at bedtime.  ? polyethylene glycol (MIRALAX / GLYCOLAX) packet Take 17 g by mouth daily.  ? pregabalin (LYRICA) 200 MG capsule Take 1 capsule (200 mg total) by mouth 3 (three) times daily.  ? Probiotic Product (PROBIOTIC PO) Take 1 tablet by mouth at bedtime.  ? propranolol (INDERAL) 10 MG tablet Take 1 tablet (10 mg total) by mouth daily as needed (palpitations).  ? senna (SENOKOT) 8.6 MG tablet Take 2 tablets by mouth 2 (two) times daily. Bedtime  ? traMADol (ULTRAM) 50 MG tablet TAKE TWO TABLETS BY MOUTH FOUR TIMES A DAY  ? [DISCONTINUED] aspirin EC 81 MG tablet Take 1 tablet (81 mg total) by mouth daily. Swallow whole.  ? [DISCONTINUED] LAGEVRIO 200 MG CAPS Take by mouth.  ? [DISCONTINUED] nortriptyline (PAMELOR) 10 MG capsule TAKE TWO CAPSULES BY MOUTH EVERY NIGHT AT BEDTIME  ?  ? ?Allergies:   Levofloxacin  ? ?Social History  ? ?Socioeconomic History  ? Marital status: Married  ?  Spouse name: Not on file  ? Number of children: Not on file  ? Years of education: Not on file  ? Highest education level: Not on file  ?Occupational History  ? Not on file  ?Tobacco Use  ? Smoking status: Never  ? Smokeless tobacco: Never  ?Vaping Use  ? Vaping Use: Never used  ?Substance and Sexual Activity  ? Alcohol use: Yes  ?  Alcohol/week: 1.0 standard drink  ?  Types: 1 Cans of beer per week  ?  Comment: occasionally maybe the weekends  ? Drug use: No  ? Sexual activity: Not on file  ?Other Topics Concern  ? Not on file  ?Social History Narrative  ? Not on file  ? ?Social Determinants of Health  ? ?Financial Resource Strain: Not on file  ?Food Insecurity: Not on file  ?Transportation Needs: Not on file   ?Physical Activity: Not on file  ?Stress: Not on file  ?Social Connections: Not on file  ?  ? ?Family History: ?The patient's family history includes Cancer in his mother; Heart failure in his father. ? ?ROS:   ?Please see the history of present illness.  All other systems reviewed and are negative. ? ?Labs/Other Studies Reviewed:   ? ?The following studies were reviewed today: ? ?CTA Chest Aorta 02/02/21 ? ?Vascular: ?  ?Fusiform ectasia of the ascending thoracic aorta measuring up to 3.8 ?cm, previously 3.7 cm. ?  ?Non-Vascular: ?  ?No acute intrathoracic abnormality. ?  ? ?Echo 01/04/21 ? ?Left Ventricle: Left ventricular ejection fraction, by estimation, is 60  ?to 65%. The left ventricle has normal function. The left ventricle has no  ?regional wall motion abnormalities. The left ventricular internal cavity  ?  size was normal in size. There is  ?no left ventricular hypertrophy. Left ventricular diastolic parameters  ?were normal.  ?Right Ventricle: The right ventricular size is normal. Right ventricular  ?systolic function is normal.  ?Left Atrium: Left atrial size was normal in size.  ?Right Atrium: Right atrial size was normal in size.  ?Pericardium: There is no evidence of pericardial effusion.  ?Mitral Valve: The mitral valve is normal in structure. Trivial mitral  ?valve regurgitation. No evidence of mitral valve stenosis.  ?Tricuspid Valve: The tricuspid valve is normal in structure. Tricuspid  ?valve regurgitation is trivial. No evidence of tricuspid stenosis.  ?Aortic Valve: The aortic valve is tricuspid. Aortic valve regurgitation is  ?not visualized. Mild aortic valve sclerosis is present, with no evidence  ?of aortic valve stenosis. Aortic valve mean gradient measures 4.0 mmHg.  ?Aortic valve peak gradient  ?measures 7.7 mmHg. Aortic valve area, by VTI measures 3.72 cm?Marland Kitchen  ?Pulmonic Valve: The pulmonic valve was normal in structure. Pulmonic valve  ?regurgitation is trivial. No evidence of pulmonic  stenosis.  ?Aorta: Aortic dilatation noted. There is borderline dilatation of the  ?ascending aorta, measuring 38 mm. There is mild dilatation of the aortic  ?arch, measuring 40 mm.  ?Venous: The inferior vena cav

## 2021-06-27 ENCOUNTER — Encounter: Payer: Self-pay | Admitting: Nurse Practitioner

## 2021-06-27 ENCOUNTER — Ambulatory Visit: Payer: Medicare Other | Admitting: Nurse Practitioner

## 2021-06-27 VITALS — BP 110/64 | HR 74 | Ht 73.5 in | Wt 208.0 lb

## 2021-06-27 DIAGNOSIS — I77810 Thoracic aortic ectasia: Secondary | ICD-10-CM

## 2021-06-27 DIAGNOSIS — Z7901 Long term (current) use of anticoagulants: Secondary | ICD-10-CM | POA: Diagnosis not present

## 2021-06-27 DIAGNOSIS — I63412 Cerebral infarction due to embolism of left middle cerebral artery: Secondary | ICD-10-CM

## 2021-06-27 DIAGNOSIS — I6523 Occlusion and stenosis of bilateral carotid arteries: Secondary | ICD-10-CM

## 2021-06-27 DIAGNOSIS — I480 Paroxysmal atrial fibrillation: Secondary | ICD-10-CM | POA: Diagnosis not present

## 2021-06-27 DIAGNOSIS — I712 Thoracic aortic aneurysm, without rupture, unspecified: Secondary | ICD-10-CM | POA: Diagnosis not present

## 2021-06-27 NOTE — Patient Instructions (Signed)
Medication Instructions:  ? ?Your physician recommends that you continue on your current medications as directed. Please refer to the Current Medication list given to you today. ? ? ?*If you need a refill on your cardiac medications before your next appointment, please call your pharmacy* ? ? ?Lab Work: ? ?Your physician recommends that you return for lab work before CTA. ? ?If you have labs (blood work) drawn today and your tests are completely normal, you will receive your results only by: ?MyChart Message (if you have MyChart) OR ?A paper copy in the mail ?If you have any lab test that is abnormal or we need to change your treatment, we will call you to review the results. ? ? ?Testing/Procedures: ? ?Non-Cardiac CT Angiography (CTA), is a special type of CT scan that uses a computer to produce multi-dimensional views of major blood vessels throughout the body. In CT angiography, a contrast material is injected through an IV to help visualize the blood vessels ? ? ? ?Follow-Up: ?At [facility], you and your health needs are our priority.  As part of our continuing mission to provide you with exceptional heart care, we have created designated Provider Care Teams.  These Care Teams include your primary Cardiologist (physician) and Advanced Practice Providers (APPs -  Physician Assistants and Nurse Practitioners) who all work together to provide you with the care you need, when you need it. ? ?We recommend signing up for the patient portal called "MyChart".  Sign up information is provided on this After Visit Summary.  MyChart is used to connect with patients for Virtual Visits (Telemedicine).  Patients are able to view lab/test results, encounter notes, upcoming appointments, etc.  Non-urgent messages can be sent to your provider as well.   ?To learn more about what you can do with MyChart, go to [email or web address].   ? ?Your next appointment:   ?1 year(s) ? ?The format for your next appointment:   ?In  Person ? ?Provider:   ?Fransico Him, MD   ? ? ?Other Instructions ? ?Your physician wants you to follow-up in: 1 year with Dr.Turner.  You will receive a reminder letter in the mail two months in advance. If you don't receive a letter, please call our office to schedule the follow-up appointment. ? ?Important Information About Sugar ? ? ? ? ?  ?

## 2021-06-30 ENCOUNTER — Encounter: Payer: Medicare Other | Attending: Registered Nurse | Admitting: Physical Medicine & Rehabilitation

## 2021-06-30 ENCOUNTER — Encounter: Payer: Self-pay | Admitting: Physical Medicine & Rehabilitation

## 2021-06-30 VITALS — BP 98/60 | HR 83 | Ht 73.5 in | Wt 213.0 lb

## 2021-06-30 DIAGNOSIS — M79672 Pain in left foot: Secondary | ICD-10-CM | POA: Diagnosis not present

## 2021-06-30 DIAGNOSIS — M79671 Pain in right foot: Secondary | ICD-10-CM | POA: Diagnosis not present

## 2021-06-30 NOTE — Progress Notes (Signed)
66 year old male who had a left posterior MCA distribution infarct September 25, 2014.  The patient has severe receptive aphasia, he had good motor recovery on the right side but has persistent right hemisensory deficits as well as pain in the right lower extremity in a nondermatomal circumferential distribution. ?Lumbar MRI in 2016 was normal.  The patient was trialed on Cymbalta which was not effective, gabapentin which was not effective, tapentadol which was not effective. ?The patient has had partial relief with high-dose tramadol 100 mg 4 times daily as well as maximum dose pregabalin 200 3 times daily.  Also on low-dose nortriptyline 10 mg nightly ?Despite this medication regimen the patient still complaining of fairly severe pain in the right lower extremity.  He was referred by his primary care to [facility] pain clinic where lumbar sympathetic injections were performed x3 but not helpful. ? ?Doppler 11/17/20 demonstrated chronic appearing Left femoral popliteal DVT ? ?EMG/NCV done today please see scanned report under media tab. ? ?Pt is a difficult historian due to aphasia  ? ?Briefly there is electrodiagnostic evidence of asymmetric sensorimotor polyneuropathy affecting BLE.  Have discussed with pt and wife and have made referral to Neuromuscular neurologist for further eval  ?

## 2021-06-30 NOTE — Patient Instructions (Signed)
Looks like you may have a lower extremity neuropathy causing symptoms on the left , will send you to neuromuscular neurologist for further eval  ?

## 2021-07-20 ENCOUNTER — Other Ambulatory Visit: Payer: Self-pay

## 2021-07-20 ENCOUNTER — Telehealth: Payer: Self-pay

## 2021-07-20 DIAGNOSIS — I63512 Cerebral infarction due to unspecified occlusion or stenosis of left middle cerebral artery: Secondary | ICD-10-CM

## 2021-07-20 NOTE — Telephone Encounter (Signed)
[name] called to have [name]'s Neurology referral change to [facility]. Patient was seen there in the past.

## 2021-08-02 ENCOUNTER — Ambulatory Visit: Payer: Medicare Other | Admitting: Diagnostic Neuroimaging

## 2021-08-02 ENCOUNTER — Encounter: Payer: Self-pay | Admitting: Diagnostic Neuroimaging

## 2021-08-02 VITALS — BP 122/75 | HR 67 | Ht 73.5 in | Wt 206.6 lb

## 2021-08-02 DIAGNOSIS — G629 Polyneuropathy, unspecified: Secondary | ICD-10-CM

## 2021-08-02 NOTE — Progress Notes (Signed)
[facility]  PATIENT: [name] DOB: 1955-04-28  REFERRING CLINICIAN: Charlett Blake, MD HISTORY FROM: patient  REASON FOR VISIT: new consult    HISTORICAL  CHIEF COMPLAINT:  Chief Complaint  Patient presents with   Neuropathy, Hx of stroke    Rm 7 New Pt   wife- [name] "both feet get numb/ burn; since stroke in 2016- right leg numb with chronic pain, progressive symptoms"     HISTORY OF PRESENT ILLNESS:   66 year old male here for evaluation of lower extremity numbness and burning sensation.  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  In last 6 to 12 months patient has had onset of burning sensation on the bottom of his left foot.  He has difficult time explaining how much this hurts, but it is mild compared to his chronic right-sided posterior rib pain.  Patient had EMG nerve conduction testing of the lower extremities which demonstrated sensorimotor polyneuropathy.  Needle examination was unremarkable.  Patient referred here for neuropathy evaluation.  Also on Lyrica, nortriptyline, tramadol for pain control.   REVIEW OF SYSTEMS: Full 14 system review of systems performed and negative with exception of: as per HPI.  ALLERGIES: Allergies  Allergen Reactions   Levofloxacin     Other reaction(s): rash    HOME MEDICATIONS: Outpatient Medications Prior to Visit  Medication Sig Dispense Refill   acetaminophen (TYLENOL) 500 MG tablet Take 1,000 mg by mouth every 6 (six) hours as needed (for pain/headaches.).     apixaban (ELIQUIS) 5 MG TABS tablet Take 1 tablet by mouth twice daily 180 tablet 1   atorvastatin (LIPITOR) 40 MG tablet TAKE 1 TABLET DAILY AT 6PM. Please keep upcoming appt in May 2023 with Dr. Radford Pax before anymore refills. Thank you 90 tablet 0   fluticasone (FLONASE) 50 MCG/ACT nasal spray Place 1 spray into both nostrils at bedtime.      levothyroxine (SYNTHROID,  LEVOTHROID) 25 MCG tablet Take 50 mcg by mouth daily before breakfast.     nortriptyline (PAMELOR) 10 MG capsule Take 1 capsule (10 mg total) by mouth at bedtime. 90 capsule 1   polyethylene glycol (MIRALAX / GLYCOLAX) packet Take 17 g by mouth daily.     pregabalin (LYRICA) 200 MG capsule Take 1 capsule (200 mg total) by mouth 3 (three) times daily. 270 capsule 1   Probiotic Product (PROBIOTIC PO) Take 1 tablet by mouth at bedtime.     propranolol (INDERAL) 10 MG tablet Take 1 tablet (10 mg total) by mouth daily as needed (palpitations). 30 tablet 3   senna (SENOKOT) 8.6 MG tablet Take 2 tablets by mouth 2 (two) times daily. Bedtime     traMADol (ULTRAM) 50 MG tablet TAKE TWO TABLETS BY MOUTH FOUR TIMES A DAY 720 tablet 1   No facility-administered medications prior to visit.    PAST MEDICAL HISTORY: 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   Ascending aorta dilatation ([place]) 10/20/2015   38 mm by echo 12/2020   Basal cell carcinoma    Bradycardia 10/21/2015   Contusion of calf, left, initial encounter 12/03/2015   CVA (cerebral vascular accident) [facility]) 2016   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   Left post branch MCA infarct hasRLE>> RUE symptoms    Dilated aortic root (HCC)    40 mm by echo 12/2020   Hyperlipidemia LDL goal <70 05/31/2016  ICAO (internal carotid artery occlusion), left 07/10/2016   chronically occluded left ICA and s/p right CEA at [facility]   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   Nephrolithiasis    Neuropathy    PAF (paroxysmal atrial fibrillation) ([place]) 07/10/2016   s/p afib ablation 02/2017   Typical atrial flutter ([place])     PAST SURGICAL HISTORY: Past Surgical History:  Procedure Laterality Date   ATRIAL FIBRILLATION ABLATION N/A 03/27/2017   Procedure: ATRIAL FIBRILLATION ABLATION;  Surgeon: [name], MD;  Location: [facility];  Service: Cardiovascular;   Laterality: N/A;   LITHOTRIPSY     VASECTOMY      FAMILY HISTORY: Family History  Problem Relation Age of Onset   Cancer Mother    Heart failure Father    Stroke Brother     SOCIAL HISTORY: Social History   Socioeconomic History   Marital status: Married    Spouse name: [name]   Number of children: 2   Years of education: Not on file   Highest education level: Not on file  Occupational History   Not on file  Tobacco Use   Smoking status: Never   Smokeless tobacco: Never  Vaping Use   Vaping Use: Never used  Substance and Sexual Activity   Alcohol use: Yes    Alcohol/week: 1.0 standard drink    Types: 1 Cans of beer per week    Comment: occasionally maybe the weekends   Drug use: No   Sexual activity: Not on file  Other Topics Concern   Not on file  Social History Narrative   08/02/21 lives with wife   Social Determinants of Health   Financial Resource Strain: Not on file  Food Insecurity: Not on file  Transportation Needs: Not on file  Physical Activity: Not on file  Stress: Not on file  Social Connections: Not on file  Intimate Partner Violence: Not on file     PHYSICAL EXAM  GENERAL EXAM/CONSTITUTIONAL: Vitals:  Vitals:   08/02/21 1457  BP: 122/75  Pulse: 67  Weight: 206 lb 9.6 oz (93.7 kg)  Height: 6' 1.5" (1.867 m)   Body mass index is 26.89 kg/m. Wt Readings from Last 3 Encounters:  08/02/21 206 lb 9.6 oz (93.7 kg)  06/30/21 213 lb (96.6 kg)  06/27/21 208 lb (94.3 kg)   Patient is in no distress; well developed, nourished and groomed; neck is supple  CARDIOVASCULAR: Examination of carotid arteries is normal; no carotid bruits Regular rate and rhythm, no murmurs Examination of peripheral vascular system by observation and palpation is normal  EYES: Ophthalmoscopic exam of optic discs and posterior segments is normal; no papilledema or hemorrhages No results found.  MUSCULOSKELETAL: Gait, strength, tone, movements noted in Neurologic  exam below  NEUROLOGIC: MENTAL STATUS:      View : No data to display.         awake, alert, oriented to person, place and time recent and remote memory intact normal attention and concentration DECR FLUENCY AND COMPREHENSION; DIFF WITH NAMING fund of knowledge appropriate  CRANIAL NERVE:  2nd - no papilledema on fundoscopic exam 2nd, 3rd, 4th, 6th - pupils equal and reactive to light, visual fields full to confrontation, extraocular muscles intact, no nystagmus 5th - facial sensation symmetric 7th - facial strength symmetric 8th - hearing intact 9th - palate elevates symmetrically, uvula midline 11th - shoulder shrug symmetric 12th - tongue protrusion midline  MOTOR:  normal bulk and tone, full strength in the BUE,  BLE  SENSORY:  normal and symmetric to light touch, temperature, vibration; DIFFICULT WITH COMPREHENSION AND EXPRESSION; SOME RIGHT - LEFT DIFFERENCE IN HANDS AND FEET  COORDINATION:  finger-nose-finger, fine finger movements normal  REFLEXES:  deep tendon reflexes 1+ and symmetric  GAIT/STATION:  narrow based gait     DIAGNOSTIC DATA (LABS, IMAGING, TESTING) - I reviewed patient records, labs, notes, testing and imaging myself where available.  Lab Results  Component Value Date   WBC 4.4 09/17/2019   HGB 14.6 09/17/2019   HCT 44.6 09/17/2019   MCV 95.5 09/17/2019   PLT 230 09/17/2019      Component Value Date/Time   NA 145 (H) 01/26/2021 1439   K 5.0 01/26/2021 1439   CL 108 (H) 01/26/2021 1439   CO2 26 01/26/2021 1439   GLUCOSE 74 01/26/2021 1439   GLUCOSE 96 09/17/2019 1226   BUN 10 01/26/2021 1439   CREATININE 0.88 01/26/2021 1439   CREATININE 0.93 10/21/2015 1423   CALCIUM 8.9 01/26/2021 1439   PROT 6.1 (L) 03/29/2017 0942   PROT 6.4 06/22/2016 0952   ALBUMIN 3.3 (L) 03/29/2017 0942   ALBUMIN 4.1 06/22/2016 0952   AST 29 03/29/2017 0942   ALT 22 03/29/2017 0942   ALKPHOS 57 03/29/2017 0942   BILITOT 0.8 03/29/2017 0942    BILITOT 0.5 06/22/2016 0952   GFRNONAA >60 09/17/2019 1226   GFRAA >60 09/17/2019 1226   Lab Results  Component Value Date   CHOL 108 06/22/2016   HDL 29 (L) 06/22/2016   LDLCALC 64 06/22/2016   TRIG 75 06/22/2016   CHOLHDL 3.7 06/22/2016   Lab Results  Component Value Date   HGBA1C 5.9 (H) 09/25/2014   No results found for: VITAMINB12 Lab Results  Component Value Date   TSH 3.640 01/26/2017    05/05/21 carotid u/s This is an abnormal carotid duplex exam demonstrating the disease  described below. There is probable occlusion of the left ICA, with  plaque bilaterally as described. CCA volume flow is asymmetrical,  higher on the left. A prior study dated 04/30/21 was reviewed. As  compared to the previous study, no significant changes were  identified .   06/30/21 EMG/NCS - electrodiagnostic evidence of asymmetric sensorimotor polyneuropathy affecting BLE; normal needle EMG    ASSESSMENT AND PLAN  66 y.o. year old male here with:   Dx:  1. Neuropathy      PLAN:  Bilateral lower ext neuropathy (new since ~2022; appears to be milder sxs than post stroke pain on right side) - check neuropathy labs - continue pain mgmt  CHRONIC RIGHT SIDED POST-STROKE PAIN - continue pain mgmt (this is the main issue that bothers the patient)  Orders Placed This Encounter  Procedures   CBC with diff   CMP   Vitamin B12   A1c   TSH   SPEP with IFE   ANA w/Reflex   SSA, SSB   ANCA Profile   Vitamin B6   Vitamin B1   Return for pending if symptoms worsen or fail to improve, pending test results.    Penni Bombard, MD 06/30/6268, 3:50 PM Certified in Neurology, Neurophysiology and Neuroimaging  [facility] [address], [place], [place] [address] [phone]

## 2021-08-08 LAB — COMPREHENSIVE METABOLIC PANEL
ALT: 27 IU/L (ref 0–44)
AST: 19 IU/L (ref 0–40)
Albumin/Globulin Ratio: 2 (ref 1.2–2.2)
Albumin: 4.5 g/dL (ref 3.8–4.8)
Alkaline Phosphatase: 74 IU/L (ref 44–121)
BUN/Creatinine Ratio: 14 (ref 10–24)
BUN: 12 mg/dL (ref 8–27)
Bilirubin Total: 0.5 mg/dL (ref 0.0–1.2)
CO2: 28 mmol/L (ref 20–29)
Calcium: 9.6 mg/dL (ref 8.6–10.2)
Chloride: 102 mmol/L (ref 96–106)
Creatinine, Ser: 0.83 mg/dL (ref 0.76–1.27)
Globulin, Total: 2.2 g/dL (ref 1.5–4.5)
Glucose: 90 mg/dL (ref 70–99)
Potassium: 4.9 mmol/L (ref 3.5–5.2)
Sodium: 142 mmol/L (ref 134–144)
Total Protein: 6.7 g/dL (ref 6.0–8.5)
eGFR: 95 mL/min/{1.73_m2} (ref >59)

## 2021-08-08 LAB — CBC WITH DIFFERENTIAL/PLATELET
Basophils Absolute: 0 10*3/uL (ref 0.0–0.2)
Basos: 0 %
EOS (ABSOLUTE): 0 10*3/uL (ref 0.0–0.4)
Eos: 1 %
Hematocrit: 42.4 % (ref 37.5–51.0)
Hemoglobin: 14.6 g/dL (ref 13.0–17.7)
Immature Grans (Abs): 0 10*3/uL (ref 0.0–0.1)
Immature Granulocytes: 0 %
Lymphocytes Absolute: 1.7 10*3/uL (ref 0.7–3.1)
Lymphs: 38 %
MCH: 31.8 pg (ref 26.6–33.0)
MCHC: 34.4 g/dL (ref 31.5–35.7)
MCV: 92 fL (ref 79–97)
Monocytes Absolute: 0.4 10*3/uL (ref 0.1–0.9)
Monocytes: 9 %
Neutrophils Absolute: 2.3 10*3/uL (ref 1.4–7.0)
Neutrophils: 52 %
Platelets: 238 10*3/uL (ref 150–450)
RBC: 4.59 x10E6/uL (ref 4.14–5.80)
RDW: 11.8 % (ref 11.6–15.4)
WBC: 4.5 10*3/uL (ref 3.4–10.8)

## 2021-08-08 LAB — MULTIPLE MYELOMA PANEL, SERUM
Albumin SerPl Elph-Mcnc: 4 g/dL (ref 2.9–4.4)
Albumin/Glob SerPl: 1.5 (ref 0.7–1.7)
Alpha 1: 0.2 g/dL (ref 0.0–0.4)
Alpha2 Glob SerPl Elph-Mcnc: 0.7 g/dL (ref 0.4–1.0)
B-Globulin SerPl Elph-Mcnc: 0.8 g/dL (ref 0.7–1.3)
Gamma Glob SerPl Elph-Mcnc: 1 g/dL (ref 0.4–1.8)
Globulin, Total: 2.7 g/dL (ref 2.2–3.9)
IgA/Immunoglobulin A, Serum: 240 mg/dL (ref 61–437)
IgG (Immunoglobin G), Serum: 1055 mg/dL (ref 603–1613)
IgM (Immunoglobulin M), Srm: 50 mg/dL (ref 20–172)

## 2021-08-08 LAB — ANA W/REFLEX: ANA Titer 1: NEGATIVE

## 2021-08-08 LAB — VITAMIN B12: Vitamin B-12: 273 pg/mL (ref 232–1245)

## 2021-08-08 LAB — ANCA PROFILE
Anti-MPO Antibodies: <0.2 units (ref 0.0–0.9)
Anti-PR3 Antibodies: <0.2 units (ref 0.0–0.9)
Atypical pANCA: <1:20 {titer}
C-ANCA: <1:20 {titer}
P-ANCA: <1:20 {titer}

## 2021-08-08 LAB — VITAMIN B6: Vitamin B6: 13.6 ug/L (ref 3.4–65.2)

## 2021-08-08 LAB — SJOGREN'S SYNDROME ANTIBODS(SSA + SSB)
ENA SSA (RO) Ab: <0.2 AI (ref 0.0–0.9)
ENA SSB (LA) Ab: <0.2 AI (ref 0.0–0.9)

## 2021-08-08 LAB — VITAMIN B1: Thiamine: 110.3 nmol/L (ref 66.5–200.0)

## 2021-08-08 LAB — HEMOGLOBIN A1C
Est. average glucose Bld gHb Est-mCnc: 117 mg/dL
Hgb A1c MFr Bld: 5.7 % — ABNORMAL HIGH (ref 4.8–5.6)

## 2021-08-08 LAB — TSH: TSH: 3.61 u[IU]/mL (ref 0.450–4.500)

## 2021-08-11 ENCOUNTER — Telehealth: Payer: Self-pay

## 2021-08-11 NOTE — Telephone Encounter (Signed)
Mr. Rouch complains of a level 8 right hip pain with activity. He wanted to know if he could get another hip injection?  His next appointment is on 09/15/2021.  Call back phone [phone].

## 2021-08-25 ENCOUNTER — Other Ambulatory Visit: Payer: Self-pay | Admitting: Cardiology

## 2021-09-09 DIAGNOSIS — L255 Unspecified contact dermatitis due to plants, except food: Secondary | ICD-10-CM | POA: Diagnosis not present

## 2021-09-15 ENCOUNTER — Encounter: Payer: Self-pay | Admitting: Physical Medicine & Rehabilitation

## 2021-09-15 ENCOUNTER — Encounter: Payer: Medicare Other | Attending: Registered Nurse | Admitting: Physical Medicine & Rehabilitation

## 2021-09-15 VITALS — BP 110/66 | HR 75 | Ht 73.5 in | Wt 200.6 lb

## 2021-09-15 DIAGNOSIS — G890 Central pain syndrome: Secondary | ICD-10-CM | POA: Diagnosis not present

## 2021-09-15 DIAGNOSIS — G894 Chronic pain syndrome: Secondary | ICD-10-CM | POA: Insufficient documentation

## 2021-09-15 DIAGNOSIS — M7061 Trochanteric bursitis, right hip: Secondary | ICD-10-CM | POA: Diagnosis not present

## 2021-09-15 MED ORDER — NORTRIPTYLINE HCL 10 MG PO CAPS
10.0000 mg | ORAL_CAPSULE | Freq: Every day | ORAL | 1 refills | Status: DC
Start: 2021-09-15 — End: 2021-09-26

## 2021-09-15 MED ORDER — PREGABALIN 200 MG PO CAPS: 200.0000 mg | ORAL_CAPSULE | Freq: Three times a day (TID) | ORAL | 1 refills | Status: DC

## 2021-09-15 MED ORDER — TRAMADOL HCL 50 MG PO TABS: ORAL_TABLET | ORAL | 1 refills | Status: DC

## 2021-09-15 NOTE — Patient Instructions (Signed)
Hip Bursitis Rehab Ask your health care provider which exercises are safe for you. Do exercises exactly as told by your health care provider and adjust them as directed. It is normal to feel mild stretching, pulling, tightness, or discomfort as you do these exercises. Stop right away if you feel sudden pain or your pain gets worse. Do not begin these exercises until told by your health care provider. Stretching exercise This exercise warms up your muscles and joints and improves the movement and flexibility of your hip. This exercise also helps to relieve pain and stiffness. Iliotibial band stretch An iliotibial band is a strong band of muscle tissue that runs from the outer side of your hip to the outer side of your thigh and knee. Lie on your side with your left / right leg in the top position. Bend your left / right knee and grab your ankle. Stretch out your bottom arm to help you balance. Slowly bring your knee back so your thigh is slightly behind your body. Slowly lower your knee toward the floor until you feel a gentle stretch on the outside of your left / right thigh. If you do not feel a stretch and your knee will not lower more toward the floor, place the heel of your other foot on top of your knee and pull your knee down toward the floor with your foot. Hold this position for __________ seconds. Slowly return to the starting position. Repeat __________ times. Complete this exercise __________ times a day. Strengthening exercises These exercises build strength and endurance in your hip and pelvis. Endurance is the ability to use your muscles for a long time, even after they get tired. Bridge This exercise strengthens the muscles that move your thigh backward (hip extensors). Lie on your back on a firm surface with your knees bent and your feet flat on the floor. Tighten your buttocks muscles and lift your buttocks off the floor until your trunk is level with your thighs. Do not arch your  back. You should feel the muscles working in your buttocks and the back of your thighs. If you do not feel these muscles, slide your feet 1-2 inches (2.5-5 cm) farther away from your buttocks. If this exercise is too easy, try doing it with your arms crossed over your chest. Hold this position for __________ seconds. Slowly lower your hips to the starting position. Let your muscles relax completely after each repetition. Repeat __________ times. Complete this exercise __________ times a day. Squats This exercise strengthens the muscles in front of your thigh and knee (quadriceps). Stand in front of a table, with your feet and knees pointing straight ahead. You may rest your hands on the table for balance but not for support. Slowly bend your knees and lower your hips like you are going to sit in a chair. Keep your weight over your heels, not over your toes. Keep your lower legs upright so they are parallel with the table legs. Do not let your hips go lower than your knees. Do not bend lower than told by your health care provider. If your hip pain increases, do not bend as low. Hold the squat position for __________ seconds. Slowly push with your legs to return to standing. Do not use your hands to pull yourself to standing. Repeat __________ times. Complete this exercise __________ times a day. Hip hike  Stand sideways on a bottom step. Stand on your left / right leg with your other foot unsupported next to   the step. You can hold on to the railing or wall for balance if needed. Keep your knees straight and your torso square. Then lift your left / right hip up toward the ceiling. Hold this position for __________ seconds. Slowly let your left / right hip lower toward the floor, past the starting position. Your foot should get closer to the floor. Do not lean or bend your knees. Repeat __________ times. Complete this exercise __________ times a day. Single leg stand This exercise increases  your balance. Without shoes, stand near a railing or in a doorway. You may hold on to the railing or door frame as needed for balance. Squeeze your left / right buttock muscles, then lift up your other foot. Do not let your left / right hip push out to the side. It is helpful to stand in front of a mirror for this exercise so you can watch your hip. Hold this position for __________ seconds. Repeat __________ times. Complete this exercise __________ times a day. This information is not intended to replace advice given to you by your health care provider. Make sure you discuss any questions you have with your health care provider. Document Revised: 01/26/2021 Document Reviewed: 01/26/2021 Elsevier Patient Education  2023 Elsevier Inc.  

## 2021-09-15 NOTE — Progress Notes (Signed)
Subjective:    Patient ID: [name], male    DOB: 1956/02/18, 66 y.o.   MRN: [id number]  HPI 66 year old male with history of left MCA distribution infarct with chronic receptive greater than expressive aphasia as well as chronic sensory deficits in the upper and lower limb.  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  He has tried numerous medications including tapentadol, gabapentin but is getting the best relief from his current regimen of Tramadol 100 mg 4 times daily Lyrica 200 mg 3 times daily Nortriptyline 10 mg daily  He has seen neurology for polyneuropathy eval no reversible causes identified Recently has had increasing right hip pain in the past has done well with right trochanteric injections. Pain Inventory Average Pain 8 Pain Right Now 8 My pain is constant, dull, tingling, and aching  In the last 24 hours, has pain interfered with the following? General activity 5 Relation with others 1 Enjoyment of life 5 What TIME of day is your pain at its worst? varies Sleep (in general) NA  Pain is worse with: some activites Pain improves with: rest and medication Relief from Meds:  na  Family History  Problem Relation Age of Onset   Cancer Mother    Heart failure Father    Stroke Brother    Social History   Socioeconomic History   Marital status: Married    Spouse name: [name]   Number of children: 2   Years of education: Not on file   Highest education level: Not on file  Occupational History   Not on file  Tobacco Use   Smoking status: Never   Smokeless tobacco: Never  Vaping Use   Vaping Use: Never used  Substance and Sexual Activity   Alcohol use: Yes    Alcohol/week: 1.0 standard drink of alcohol    Types: 1 Cans of beer per week    Comment: occasionally maybe the weekends   Drug use: No   Sexual activity: Not on file  Other Topics Concern   Not on file  Social History Narrative   08/02/21 lives with wife    Social Determinants of Health   Financial Resource Strain: Not on file  Food Insecurity: Not on file  Transportation Needs: Not on file  Physical Activity: Not on file  Stress: Not on file  Social Connections: Not on file   Past Surgical History:  Procedure Laterality Date   [place] N/A 03/27/2017   Procedure: [place];  Surgeon: [name], MD;  Location: [facility];  Service: Cardiovascular;  Laterality: N/A;   LITHOTRIPSY     VASECTOMY     Past Surgical History:  Procedure Laterality Date   ATRIAL FIBRILLATION ABLATION N/A 03/27/2017   Procedure: ATRIAL FIBRILLATION ABLATION;  Surgeon: [name], MD;  Location: [facility];  Service: Cardiovascular;  Laterality: N/A;   LITHOTRIPSY     VASECTOMY     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   Ascending aorta dilatation ([place]) 10/20/2015   38 mm by echo 12/2020   Basal cell carcinoma    Bradycardia 10/21/2015   Contusion of calf, left, initial encounter 12/03/2015   CVA (cerebral vascular accident) [facility]) 2016   [place] syndrome 10/23/2014   Left post branch MCA infarct hasRLE>> RUE symptoms    Dilated aortic root (HCC)    40 mm  by echo 12/2020   Hyperlipidemia LDL goal <70 05/31/2016   ICAO (internal carotid artery occlusion), left 07/10/2016   chronically occluded left ICA and s/p right CEA at [facility]   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   Nephrolithiasis    Neuropathy    PAF (paroxysmal atrial fibrillation) ([place]) 07/10/2016   s/p afib ablation 02/2017   Typical atrial flutter (HCC)    BP 110/66   Pulse 75   Ht 6' 1.5" (1.867 m)   Wt 200 lb 9.6 oz (91 kg)   SpO2 94%   BMI 26.11 kg/m   Opioid Risk Score:   Fall Risk Score:  `1  Depression screen PHQ 2/9     09/15/2021    2:22 PM 05/03/2021    2:30 PM 03/30/2021   11:11 AM 09/30/2020   10:46 AM  10/07/2019   11:05 AM 12/29/2016   10:41 AM 06/04/2015   10:12 AM  Depression screen PHQ 2/9  Decreased Interest 0 0 0 0 0 0 0  Down, Depressed, Hopeless 0 0 0 0 0 0 0  PHQ - 2 Score 0 0 0 0 0 0 0     Review of Systems  Constitutional: Negative.   HENT: Negative.    Eyes: Negative.   Respiratory: Negative.    Cardiovascular: Negative.   Gastrointestinal: Negative.   Endocrine: Negative.   Genitourinary: Negative.   Musculoskeletal:        Right hip pain  Skin: Negative.   Allergic/Immunologic: Negative.   Neurological: Negative.   Hematological: Negative.   Psychiatric/Behavioral: Negative.    All other systems reviewed and are negative.      Objective:   Physical Exam Vitals and nursing note reviewed.  Constitutional:      Appearance: He is normal weight.  HENT:     Head: Normocephalic and atraumatic.  Eyes:     Extraocular Movements: Extraocular movements intact.     Conjunctiva/sclera: Conjunctivae normal.     Pupils: Pupils are equal, round, and reactive to light.  Musculoskeletal:     Right lower leg: No edema.     Left lower leg: No edema.     Comments: Tenderness palpation right greater trochanter.  No erythema or swelling over the area.  No pain with ambulation  Skin:    General: Skin is warm and dry.     Coloration: Skin is not jaundiced.  Neurological:     Mental Status: He is alert.     Comments: Severely aphasic cannot assess orientation Follows basic commands with gestural cues  Psychiatric:        Mood and Affect: Mood normal.           Assessment & Plan:   1.  Central post stroke pain syndrome continue tramadol 100 mg 4 times daily, nortriptyline 10 mg nightly, Lyrica 200 mg 3 times daily  2.  Right Trochanteric bursitis repeat injection today RIght Trochanteric bursa injection without ultrasound guidance  Indication Trochanteric bursitis. Exam has tenderness over the greater trochanter of the hip. Pain has not responded to conservative  care such as exercise therapy and oral medications. Pain interferes with sleep or with mobility Informed consent was obtained after describing risks and benefits of the procedure with the patient these include bleeding bruising and infection. Patient has signed written consent form. Patient placed in a lateral decubitus position with the affected hip superior. Point of maximal pain was palpated marked and prepped with Betadine and entered with a needle to  bone contact. Needle slightly withdrawn then '6mg'$  of betamethasone with 4 cc 1% lidocaine were injected. Patient tolerated procedure well. Post procedure instructions given.

## 2021-09-21 ENCOUNTER — Other Ambulatory Visit: Payer: Self-pay | Admitting: Registered Nurse

## 2021-09-23 ENCOUNTER — Other Ambulatory Visit: Payer: Self-pay | Admitting: Registered Nurse

## 2021-09-26 ENCOUNTER — Telehealth: Payer: Self-pay

## 2021-09-26 NOTE — Telephone Encounter (Signed)
CHANGE OF PHARMACY - PLEASE SEND TO [facility]- Prior sent scripts call and cancelled today.

## 2021-09-27 ENCOUNTER — Ambulatory Visit: Payer: Medicare Other | Admitting: Physical Medicine & Rehabilitation

## 2021-09-27 MED ORDER — TRAMADOL HCL 50 MG PO TABS: ORAL_TABLET | ORAL | 1 refills | Status: DC

## 2021-09-27 MED ORDER — NORTRIPTYLINE HCL 10 MG PO CAPS
10.0000 mg | ORAL_CAPSULE | Freq: Every day | ORAL | 1 refills | Status: DC
Start: 2021-09-27 — End: 2021-11-30

## 2021-09-27 MED ORDER — PREGABALIN 200 MG PO CAPS: 200.0000 mg | ORAL_CAPSULE | Freq: Three times a day (TID) | ORAL | 1 refills | Status: DC

## 2021-10-06 DIAGNOSIS — M792 Neuralgia and neuritis, unspecified: Secondary | ICD-10-CM | POA: Diagnosis not present

## 2021-10-06 DIAGNOSIS — G894 Chronic pain syndrome: Secondary | ICD-10-CM | POA: Diagnosis not present

## 2021-10-06 DIAGNOSIS — I6930 Unspecified sequelae of cerebral infarction: Secondary | ICD-10-CM | POA: Diagnosis not present

## 2021-11-10 NOTE — Progress Notes (Unsigned)
Cardiology Office Note Date:  11/10/2021  Patient ID:  [name], DOB 1955-04-02, MRN [id number] PCP:  [facility] @ [place]  Cardiologist:  Dr. Radford Pax Electrophysiologist: Dr. Rayann Heman   Chief Complaint:  *** annual visit  History of Present Illness: [name] is a 66 y.o. male with history of AFib, stroke, PVD (s/p R CEA, follows with vascular team at [facility]), HLD.  He saw Dr. Rayann Heman,  Sept 2021, he was doing well, his ASA stopped, continued Eliquis, planned for 6 mo follow up with AFib clinic.  I saw him march 2022 He comes accompanied by his wife. Last summer had a few hours of palpitations, decided to get check though stopped by the time they arrived to the hospital, was noted in normal rhythm, this was the last time he has felt any symptoms of his Afib No CP, SOB or DOE. No dizzy spells, near syncope or syncope. No bleeding or signs of bleeding. They report the vascular specialist has recommended every other year visits now. PMD does labs for him  He has terrible RLE pain and planned for nerve block 2/2 this. Procedure planned: sympathetic nerve block  Needs 3 days off Eliquis Felt a reasonable cardiac risk for the procedure planned  He saw Dr. Rayann Heman Sept 2022, doing well, had been to [facility], reported some edema, given his [place] low suspicion of DVT though planned for [place].  LE venous US noted a chronic DVT LLE  Most recently saw Elwyn Reach, NP  May 2023, discussed need to continue indefinite a/c with his Afib and chronic DVT.  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   Left post branch MCA infarct hasRLE>> RUE symptoms    Dilated aortic root (HCC)    40 mm by echo 12/2020   Hyperlipidemia LDL goal <70 05/31/2016   ICAO (internal carotid artery occlusion), left 07/10/2016   chronically occluded left ICA and s/p right CEA at [facility]   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   Nephrolithiasis    Neuropathy    PAF (paroxysmal atrial fibrillation) ([place]) 07/10/2016   s/p afib ablation 02/2017   Typical atrial flutter ([place])     Past Surgical History:  Procedure Laterality Date   ATRIAL FIBRILLATION ABLATION N/A 03/27/2017   Procedure: ATRIAL FIBRILLATION ABLATION;  Surgeon: [name], MD;  Location: [place] [facility];  Service: Cardiovascular;  Laterality: N/A;   LITHOTRIPSY     VASECTOMY      Current Outpatient Medications  Medication Sig Dispense Refill   acetaminophen (TYLENOL) 500 MG tablet Take 1,000 mg by mouth every 6 (six) hours as needed (for pain/headaches.).     apixaban (ELIQUIS) 5 MG TABS tablet Take 1 tablet by mouth twice daily 180 tablet 1   atorvastatin (LIPITOR) 40 MG tablet TAKE 1 TABLET DAILY AT 6  P.M. 90 tablet 3   fluticasone (FLONASE) 50 MCG/ACT nasal spray Place 1 spray  into both nostrils at bedtime.      levothyroxine (SYNTHROID) 50 MCG tablet Take 50 mcg by mouth daily.     levothyroxine (SYNTHROID, LEVOTHROID) 25 MCG tablet Take 50 mcg by mouth daily before breakfast.     nortriptyline (PAMELOR) 10 MG capsule Take 1 capsule (10 mg total) by mouth at bedtime. 90 capsule 1   polyethylene glycol (MIRALAX / GLYCOLAX) packet Take 17 g by mouth daily.     pregabalin (LYRICA) 200 MG capsule Take 1 capsule (200 mg total) by mouth 3 (three) times daily. 270 capsule 1   Probiotic Product (PROBIOTIC PO) Take 1 tablet by mouth at bedtime.     propranolol (INDERAL) 10 MG  tablet Take 1 tablet (10 mg total) by mouth daily as needed (palpitations). 30 tablet 3   senna (SENOKOT) 8.6 MG tablet Take 2 tablets by mouth 2 (two) times daily. Bedtime     traMADol (ULTRAM) 50 MG tablet TAKE TWO TABLETS BY MOUTH FOUR TIMES A DAY 720 tablet 1   triamcinolone cream (KENALOG) 0.1 % Apply topically.     No current facility-administered medications for this visit.    Allergies:   Levofloxacin   Social History:  The patient  reports that he has never smoked. He has never used smokeless tobacco. He reports current alcohol use of about 1.0 standard drink of alcohol per week. He reports that he does not use drugs.   Family History:  The patient's family history includes Cancer in his mother; Heart failure in his father; Stroke in his brother.  ROS:  Please see the history of present illness.    All other systems are reviewed and otherwise negative.   PHYSICAL EXAM:  VS:  There were no vitals taken for this visit. BMI: There is no height or weight on file to calculate BMI. Well nourished, well developed, in no acute distress HEENT: normocephalic, atraumatic Neck: no JVD, carotid bruits or masses Cardiac:  RRR; no significant murmurs, no rubs, or gallops Lungs:  CTA b/l, no wheezing, rhonchi or rales Abd: soft, nontender MS: no deformity or atrophy Ext: no edema Skin: warm and dry, no rash Neuro:  No gross deficits appreciated Psych: euthymic mood, full affect   EKG:  Done today and reviewed by myself shows  ***  Echo 01/04/21   Left Ventricle: Left ventricular ejection fraction, by estimation, is 60  to 65%. The left ventricle has normal function. The left ventricle has no  regional wall motion abnormalities. The left ventricular internal cavity  size was normal in size. There is  no left ventricular hypertrophy. Left ventricular diastolic parameters  were normal.  Right Ventricle: The right ventricular size is normal. Right ventricular  systolic function is  normal.  Left Atrium: Left atrial size was normal in size.  Right Atrium: Right atrial size was normal in size.  Pericardium: There is no evidence of pericardial effusion.  Mitral Valve: The mitral valve is normal in structure. Trivial mitral  valve regurgitation. No evidence of mitral valve stenosis.  Tricuspid Valve: The tricuspid valve is normal in structure. Tricuspid  valve regurgitation is trivial. No evidence of tricuspid stenosis.  Aortic Valve: The aortic valve is tricuspid. Aortic valve regurgitation is  not visualized. Mild aortic valve sclerosis is present, with no evidence  of aortic valve stenosis. Aortic valve mean gradient measures 4.0 mmHg.  Aortic valve peak gradient  measures 7.7 mmHg. Aortic valve area, by VTI measures 3.72 cm.  Pulmonic Valve: The pulmonic valve was  normal in structure. Pulmonic valve  regurgitation is trivial. No evidence of pulmonic stenosis.  Aorta: Aortic dilatation noted. There is borderline dilatation of the  ascending aorta, measuring 38 mm. There is mild dilatation of the aortic  arch, measuring 40 mm.  Venous: The inferior vena cava is normal in size with greater than 50%  respiratory variability, suggesting right atrial pressure of 3 mmHg.  IAS/Shunts: No atrial level shunt detected by color flow Doppler.      VAS [place] LE DVT 11/18/20   No evidence of deep vein thrombosis in the lower extremity. No indirect  evidence of obstruction proximal to the inguinal ligament.  - No cystic structure found in the popliteal fossa.    LEFT:  - Findings consistent with chronic deep vein thrombosis involving the left  femoral vein, and left popliteal vein.  - No cystic structure found in the popliteal fossa.      Echocardiogram 09/23/2019 EF 55-60, normal wall motion, normal diastolic function, normal RVSF, ascending aorta 39 mm   Chest CTA 09/02/2018 Ascending thoracic aorta 3.6 x 3.6 cm   03/27/2017: EPS/ablation CONCLUSIONS: 1. Sinus rhythm  upon presentation.   2. Intracardiac echo reveals a moderate sized left atrium with four separate pulmonary veins without evidence of pulmonary vein stenosis. 3. Successful electrical isolation and anatomical encircling of all four pulmonary veins with radiofrequency current.    Entrance and exit block were difficult to obtain.  Antral ablation was required.  Should the patient have additional afib, would consider cryo ablation. 4. Cavo-tricuspid isthmus ablation was performed  5. No inducible arrhythmias following ablation both on and off of Isuprel 6. No early apparent complications.    Cardiac CT 03/20/2017 IMPRESSION: 1.  No LAA thrombus mild bi atrial enlargement 2.  Normal PV anatomy see dimensions above  3.  Mild aortic root enlargement 3.8 cm  4.  Esophagus courses closest to the LLPV ostium  5.  No pericardial effusion  6.  Calcium score 0   Carotid US 01/14/2016 R 60-79; L occluded   Myoview 05/29/2014 EF 63, no ischemia  Recent Labs: 08/02/2021: ALT 27; BUN 12; Creatinine, Ser 0.83; Hemoglobin 14.6; Platelets 238; Potassium 4.9; Sodium 142; TSH 3.610  No results found for requested labs within last 365 days.   CrCl cannot be calculated (Patient's most recent lab result is older than the maximum 21 days allowed.).   Wt Readings from Last 3 Encounters:  09/15/21 200 lb 9.6 oz (91 kg)  08/02/21 206 lb 9.6 oz (93.7 kg)  06/30/21 213 lb (96.6 kg)     Other studies reviewed: Additional studies/records reviewed today include: summarized above  ASSESSMENT AND PLAN:  1. Paroxysmal Afib/flutter     S/p ablation     CHA2DS2Vasc is 4 2. Chronic LLE DVT     on *** Eliquis, appropriately dosed       2. PVD     H/o CEA     *** follows with [facility] vascular team     Disposition: ***   Current medicines are reviewed at length with the patient today.  The patient did not have any concerns regarding medicines.  Venetia Night, PA-C 11/10/2021 1:32 PM     [address] [place]  [phone] (office)  [phone] (fax)

## 2021-11-14 ENCOUNTER — Ambulatory Visit: Payer: Medicare Other | Attending: Physician Assistant | Admitting: Physician Assistant

## 2021-11-14 ENCOUNTER — Encounter: Payer: Self-pay | Admitting: Physician Assistant

## 2021-11-14 VITALS — BP 110/60 | HR 64 | Ht 73.5 in | Wt 205.8 lb

## 2021-11-14 DIAGNOSIS — I480 Paroxysmal atrial fibrillation: Secondary | ICD-10-CM | POA: Diagnosis not present

## 2021-11-14 DIAGNOSIS — I4892 Unspecified atrial flutter: Secondary | ICD-10-CM

## 2021-11-14 MED ORDER — APIXABAN 5 MG PO TABS: 5.0000 mg | ORAL_TABLET | Freq: Two times a day (BID) | ORAL | 3 refills | Status: DC

## 2021-11-14 MED ORDER — PROPRANOLOL HCL 10 MG PO TABS: 10.0000 mg | ORAL_TABLET | Freq: Every day | ORAL | 1 refills | Status: AC | PRN

## 2021-11-14 MED ORDER — PROPRANOLOL HCL 10 MG PO TABS: 10.0000 mg | ORAL_TABLET | Freq: Every day | ORAL | 1 refills | Status: DC | PRN

## 2021-11-14 NOTE — Patient Instructions (Signed)
Medication Instructions:   Your physician recommends that you continue on your current medications as directed. Please refer to the Current Medication list given to you today.   *If you need a refill on your cardiac medications before your next appointment, please call your pharmacy*   Lab Work: NONE ORDERED  TODAY   If you have labs (blood work) drawn today and your tests are completely normal, you will receive your results only by: MyChart Message (if you have MyChart) OR A paper copy in the mail If you have any lab test that is abnormal or we need to change your treatment, we will call you to review the results.   Testing/Procedures: NONE ORDERED  TODAY    Follow-Up: At [facility], you and your health needs are our priority.  As part of our continuing mission to provide you with exceptional heart care, we have created designated Provider Care Teams.  These Care Teams include your primary Cardiologist (physician) and Advanced Practice Providers (APPs -  Physician Assistants and Nurse Practitioners) who all work together to provide you with the care you need, when you need it.  We recommend signing up for the patient portal called "MyChart".  Sign up information is provided on this After Visit Summary.  MyChart is used to connect with patients for Virtual Visits (Telemedicine).  Patients are able to view lab/test results, encounter notes, upcoming appointments, etc.  Non-urgent messages can be sent to your provider as well.   To learn more about what you can do with MyChart, go to [email or web address].com.    Your next appointment:   1 year(s)  The format for your next appointment:   In Person  Provider:   Augustus Mealor, MD    Other Instructions   Important Information About Sugar       

## 2021-11-14 NOTE — Addendum Note (Signed)
Addended by: Claude Manges on: 11/14/2021 11:04 AM   Modules accepted: Orders

## 2021-11-16 ENCOUNTER — Telehealth: Payer: Self-pay

## 2021-11-16 NOTE — Telephone Encounter (Signed)
[name] is having right hip pain. He is requesting a hip injection as soon as possible. Pain level is at a level 8 from his right hip down to the leg.It is constant pain. Call back [phone].

## 2021-11-17 DIAGNOSIS — D225 Melanocytic nevi of trunk: Secondary | ICD-10-CM | POA: Diagnosis not present

## 2021-11-17 DIAGNOSIS — L814 Other melanin hyperpigmentation: Secondary | ICD-10-CM | POA: Diagnosis not present

## 2021-11-17 DIAGNOSIS — L821 Other seborrheic keratosis: Secondary | ICD-10-CM | POA: Diagnosis not present

## 2021-11-17 DIAGNOSIS — Z85828 Personal history of other malignant neoplasm of skin: Secondary | ICD-10-CM | POA: Diagnosis not present

## 2021-11-30 ENCOUNTER — Telehealth: Payer: Self-pay | Admitting: *Deleted

## 2021-11-30 MED ORDER — NORTRIPTYLINE HCL 10 MG PO CAPS: ORAL_CAPSULE | ORAL | 1 refills | Status: DC

## 2021-11-30 NOTE — Telephone Encounter (Signed)
Reviewed Note:  In 2021, he reported to [name] the Pamelor 20 mg made him drowsy.  Placed a call to Mrs. Landstrom, she states [name] has been taking his Pamelor 20 mg at bedtime for the last few weeks, without any adverse reaction.  Pamelor will be sent to pharmacy, she verbalizes understanding.

## 2021-11-30 NOTE — Telephone Encounter (Signed)
[name] called to request refill on his nortriptylline.He wants to go back to taking two at night because it helps him sleep better. They request 90 day supply as well to HT.

## 2021-12-30 ENCOUNTER — Encounter: Payer: Medicare Other | Attending: Physical Medicine & Rehabilitation | Admitting: Physical Medicine & Rehabilitation

## 2021-12-30 ENCOUNTER — Encounter: Payer: Self-pay | Admitting: Physical Medicine & Rehabilitation

## 2021-12-30 VITALS — BP 103/66 | HR 77 | Temp 97.9°F | Ht 73.0 in | Wt 205.2 lb

## 2021-12-30 DIAGNOSIS — Z79891 Long term (current) use of opiate analgesic: Secondary | ICD-10-CM | POA: Insufficient documentation

## 2021-12-30 DIAGNOSIS — G894 Chronic pain syndrome: Secondary | ICD-10-CM | POA: Insufficient documentation

## 2021-12-30 DIAGNOSIS — G609 Hereditary and idiopathic neuropathy, unspecified: Secondary | ICD-10-CM | POA: Insufficient documentation

## 2021-12-30 DIAGNOSIS — Z5181 Encounter for therapeutic drug level monitoring: Secondary | ICD-10-CM | POA: Diagnosis not present

## 2021-12-30 DIAGNOSIS — M7061 Trochanteric bursitis, right hip: Secondary | ICD-10-CM | POA: Diagnosis not present

## 2021-12-30 MED ORDER — LIDOCAINE HCL 1 % IJ SOLN
4.0000 mL | Freq: Once | INTRAMUSCULAR | Status: AC
  Administered 2021-12-30: 4 mL

## 2021-12-30 MED ORDER — BETAMETHASONE SOD PHOS & ACET 6 (3-3) MG/ML IJ SUSP
12.0000 mg | Freq: Once | INTRAMUSCULAR | Status: AC
  Administered 2021-12-30: 12 mg via INTRAMUSCULAR

## 2021-12-30 NOTE — Patient Instructions (Signed)
Referral to Dr Curlene Dolphin for Qutenza Capsaicin Patches What is this medication? CAPSAICIN (cap SAY sin) relieves minor pain in your muscles and joints. It works by making your skin feel warm or cool, which blocks pain signals going to the brain. This medicine may be used for other purposes; ask your health care provider or pharmacist if you have questions. COMMON BRAND NAME(S): Qutenza What should I tell my care team before I take this medication? They need to know if you have any of these conditions: Broken or irritated skin High blood pressure History of heart attack or stroke An unusual or allergic reaction to capsaicin, hot peppers, other medications, foods, dyes, or preservatives Pregnant or trying to get pregnant Breast-feeding How should I use this medication? This medication is for external use only. It is applied by your care team in a hospital or clinic setting. Talk to your care team about the use of this medication in children. Special care may be needed. Overdosage: If you think you have taken too much of this medicine contact a poison control center or emergency room at once. NOTE: This medicine is only for you. Do not share this medicine with others. What if I miss a dose? This does not apply. What may interact with this medication? Interactions are not expected. Do not use any other skin products on the affected area without asking your care team. This list may not describe all possible interactions. Give your health care provider a list of all the medicines, herbs, non-prescription drugs, or dietary supplements you use. Also tell them if you smoke, drink alcohol, or use illegal drugs. Some items may interact with your medicine. What should I watch for while using this medication? Your condition will be monitored carefully while you are receiving this medication. Your blood pressure may go up during the procedure. Do not touch the medication patch during treatment. This  medication causes red, burning skin. You may need pain medication for during and after the procedure. This medication can make you more sensitive to heat for a few days after treatment. Be careful in hot showers or baths. Keep out of the sun. Exercise may make the treated skin feel hotter. Tell your care team if your symptoms do not start to get better or if they get worse. What side effects may I notice from receiving this medication? Side effects that you should report to your care team as soon as possible: Allergic reactions--skin rash, itching, hives, swelling of the face, lips, tongue, or throat Side effects that usually do not require medical attention (report these to your care team if they continue or are bothersome): Mild skin irritation, redness, or dryness This list may not describe all possible side effects. Call your doctor for medical advice about side effects. You may report side effects to FDA at 1-800-FDA-1088. Where should I keep my medication? This medication is given in a hospital or clinic. It will not be stored at home. NOTE: This sheet is a summary. It may not cover all possible information. If you have questions about this medicine, talk to your doctor, pharmacist, or health care provider.  2023 Elsevier/Gold Standard (2020-10-13 00:00:00)

## 2021-12-30 NOTE — Progress Notes (Signed)
RIght hip Trochanteric bursa injection With or without ultrasound guidance  Indication Trochanteric bursitis. Exam has tenderness over the greater trochanter of the hip. Pain has not responded to conservative care such as exercise therapy and oral medications. Pain interferes with sleep or with mobility Informed consent was obtained after describing risks and benefits of the procedure with the patient these include bleeding bruising and infection. Patient has signed written consent form. Patient placed in a lateral decubitus position with the affected hip superior. Point of maximal pain was palpated marked and prepped with Betadine and entered with a needle to bone contact. Needle slightly withdrawn then '6mg'$  of betamethasone with 4 cc 1% lidocaine were injected. Patient tolerated procedure well. Post procedure instructions given.   May need US guidance if this injection less helpful than prior injections  Pt also c/o numbness /pain in B feet, diagnoses by Neurology using EMG with polyneuropathy Referral to Dr Christella Noa for Elby Beck

## 2022-01-05 ENCOUNTER — Encounter: Payer: Self-pay | Admitting: Physical Medicine & Rehabilitation

## 2022-01-05 ENCOUNTER — Encounter: Payer: Medicare Other | Admitting: Physical Medicine & Rehabilitation

## 2022-01-05 VITALS — BP 118/76 | HR 77 | Temp 98.1°F | Ht 73.0 in | Wt 206.0 lb

## 2022-01-05 DIAGNOSIS — G609 Hereditary and idiopathic neuropathy, unspecified: Secondary | ICD-10-CM

## 2022-01-05 DIAGNOSIS — Z79891 Long term (current) use of opiate analgesic: Secondary | ICD-10-CM | POA: Diagnosis not present

## 2022-01-05 DIAGNOSIS — M7061 Trochanteric bursitis, right hip: Secondary | ICD-10-CM | POA: Diagnosis not present

## 2022-01-05 DIAGNOSIS — G894 Chronic pain syndrome: Secondary | ICD-10-CM | POA: Diagnosis not present

## 2022-01-05 DIAGNOSIS — Z5181 Encounter for therapeutic drug level monitoring: Secondary | ICD-10-CM | POA: Diagnosis not present

## 2022-01-05 LAB — TOXASSURE SELECT,+ANTIDEPR,UR

## 2022-01-05 NOTE — Progress Notes (Signed)
Subjective:    Patient ID: [name], male    DOB: [birth year], 66 y.o.   MRN: [id number]  HPI [name] is a 66 y.o. 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   He is here for evaluation for treatment with Qutenza.  He is currently on nortriptyline, Lyrica, tramadol for his pain.  He follows with Dr. Letta Pate.  He has been seen by neurology for polyneuropathy and not found to have any reversible causes.  He had a prior EMG that showed polyneuropathy in his lower extremities.  He continues to report numbness and pain in his bilateral feet.  He reports his pain has been worsening for a few years.  Pain is burning in quality. Pain is worse in R>L LE.    Pain Inventory Average Pain 8 Pain Right Now 8 My pain is constant, dull, tingling, and aching  In the last 24 hours, has pain interfered with the following? General activity 5 Relation with others 1 Enjoyment of life 5 What TIME of day is your pain at its worst? varies Sleep (in general) NA  Pain is worse with: some activites Pain improves with: rest and medication Relief from Meds:  na  Family History  Problem Relation Age of Onset   Cancer Mother    Heart failure Father    Stroke Brother    Social History   Socioeconomic History   Marital status: Married    Spouse name: [name]   Number of  children: 2   Years of education: Not on file   Highest education level: Not on file  Occupational History   Not on file  Tobacco Use   Smoking status: Never   Smokeless tobacco: Never  Vaping Use   Vaping Use: Never used  Substance and Sexual Activity   Alcohol use: Yes    Alcohol/week: 1.0 standard drink of alcohol    Types: 1 Cans of beer per week    Comment: occasionally maybe the weekends   Drug use: No   Sexual activity: Not on file  Other Topics Concern   Not on file  Social History Narrative   08/02/21 lives with wife   Social Determinants of Health   Financial Resource Strain: Not on file  Food Insecurity: Not on file  Transportation Needs: Not on file  Physical Activity: Not on file  Stress: Not on file  Social Connections: Not on file   Past Surgical History:  Procedure Laterality Date   [place] N/A 03/27/2017   Procedure: [place];  Surgeon: [name], MD;  Location: [facility];  Service: Cardiovascular;  Laterality: N/A;   LITHOTRIPSY     VASECTOMY     Past Surgical History:  Procedure Laterality Date   ATRIAL FIBRILLATION ABLATION N/A 03/27/2017   Procedure: ATRIAL FIBRILLATION ABLATION;  Surgeon: [name], MD;  Location: [facility];  Service: Cardiovascular;  Laterality: N/A;   LITHOTRIPSY     VASECTOMY     Past Medical History:  Diagnosis  Date   Altered sensation due to stroke 12/10/2014   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   Ascending aorta dilatation ([place]) 10/20/2015   38 mm by echo 12/2020   Basal cell carcinoma    Bradycardia 10/21/2015   Contusion of calf, left, initial encounter 12/03/2015   CVA (cerebral vascular accident) ([place]) 2016   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   Left post branch MCA infarct hasRLE>> RUE symptoms    Dilated aortic root ([place])    40 mm by echo 12/2020   Hyperlipidemia LDL goal <70 05/31/2016   ICAO (internal carotid artery occlusion), left 07/10/2016    chronically occluded left ICA and s/p right [facility] at [facility]   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   Nephrolithiasis    Neuropathy    PAF (paroxysmal atrial fibrillation) ([place]) 07/10/2016   s/p afib ablation 02/2017   Typical atrial flutter (HCC)    BP 118/76   Pulse 77   Temp 98.1 F (36.7 C)   Ht '6\' 1"'$  (1.854 m)   Wt 93.4 kg   BMI 27.18 kg/m   Opioid Risk Score:   Fall Risk Score:  `1  Depression screen PHQ 2/9     01/05/2022    1:51 PM 09/15/2021    2:22 PM 05/03/2021    2:30 PM 03/30/2021   11:11 AM 09/30/2020   10:46 AM 10/07/2019   11:05 AM 12/29/2016   10:41 AM  Depression screen PHQ 2/9  Decreased Interest 0 0 0 0 0 0 0  Down, Depressed, Hopeless  0 0 0 0 0 0  PHQ - 2 Score 0 0 0 0 0 0 0     Review of Systems  Constitutional: Negative.   HENT: Negative.    Eyes: Negative.   Respiratory: Negative.    Cardiovascular: Negative.   Gastrointestinal: Negative.   Endocrine: Negative.   Genitourinary: Negative.   Musculoskeletal:        Right hip pain  Skin: Negative.   Allergic/Immunologic: Negative.   Neurological:  Positive for numbness.  Hematological: Negative.   Psychiatric/Behavioral: Negative.    All other systems reviewed and are negative.      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  Discussed that this is a capsaicin patch, stronger than capsaicin cream. Discussed that it is currently approved for diabetic peripheral neuropathy and post-herpetic neuralgia, but that it has also shown benefit in treating other forms of neuropathy. Provided patient with link to site to learn more about the patch: [employer].fr. Discussed that the patch would be placed in office and benefits usually last 3 months. Discussed that unintended exposure to capsaicin can cause severe irritation of eyes, mucous membranes, respiratory tract, and  skin, but that Qutenza is a local treatment and does not have the systemic side effects of other nerve medications. Discussed that there may be pain, itching, erythema, and decreased sensory function associated with the application of Qutenza. Side effects usually subside within 1 week. A cold pack of analgesic medications can help with these side effects. Blood pressure can also be increased due to pain associated with administration of the patch.  -Patient and wife decided to hold off on Qutenza application today after reviewing cost of the medication, happy to complete Qutenza application if they reconsider at a later time

## 2022-01-11 ENCOUNTER — Telehealth: Payer: Self-pay | Admitting: *Deleted

## 2022-01-11 NOTE — Telephone Encounter (Signed)
Urine drug screen for this encounter is consistent for prescribed medication, but is also positive for alcohol. A formal warning letter will be sent via [facility].

## 2022-01-25 ENCOUNTER — Telehealth: Payer: Self-pay | Admitting: Physician Assistant

## 2022-01-25 NOTE — Telephone Encounter (Signed)
Pt c/o medication issue:  1. Name of Medication: apixaban (ELIQUIS) 5 MG TABS tablet   2. How are you currently taking this medication (dosage and times per day)? As prescribed  3. Are you having a reaction (difficulty breathing--STAT)? No   4. What is your medication issue? Patient's wife is calling stating [facility] charged them $400 for shipping out eliquis after the prescription had already been picked up from [facility]. She states the pharmacy advised her we sent a prescription to them and they auto filled the medication. Manuela Schwartz is wanting to know if there is any record of this as well as if there is anything that can be done regarding this. Please advise.

## 2022-01-25 NOTE — Telephone Encounter (Signed)
Spoke to wife. Aware there is not anything we can do but recommend discussing further w/ [facility]. Wife is going to call and re discuss this w/ them. She appreciates the return call.

## 2022-01-30 ENCOUNTER — Inpatient Hospital Stay: Admission: RE | Admit: 2022-01-30 | Payer: Medicare Other | Source: Ambulatory Visit

## 2022-01-30 ENCOUNTER — Ambulatory Visit
Admission: RE | Admit: 2022-01-30 | Discharge: 2022-01-30 | Disposition: A | Discharge status: Home / Self Care | Payer: Medicare Other | Source: Ambulatory Visit | Attending: Nurse Practitioner | Admitting: Nurse Practitioner

## 2022-01-30 DIAGNOSIS — I712 Thoracic aortic aneurysm, without rupture, unspecified: Secondary | ICD-10-CM

## 2022-01-30 DIAGNOSIS — K760 Fatty (change of) liver, not elsewhere classified: Secondary | ICD-10-CM | POA: Diagnosis not present

## 2022-01-30 DIAGNOSIS — I77810 Thoracic aortic ectasia: Secondary | ICD-10-CM

## 2022-01-30 DIAGNOSIS — R911 Solitary pulmonary nodule: Secondary | ICD-10-CM | POA: Diagnosis not present

## 2022-01-30 DIAGNOSIS — N281 Cyst of kidney, acquired: Secondary | ICD-10-CM | POA: Diagnosis not present

## 2022-01-30 MED ORDER — IOPAMIDOL (ISOVUE-370) INJECTION 76%
75.0000 mL | Freq: Once | INTRAVENOUS | Status: AC | PRN
  Administered 2022-01-30: 75 mL via INTRAVENOUS

## 2022-02-13 ENCOUNTER — Ambulatory Visit: Payer: Medicare Other | Attending: Nurse Practitioner

## 2022-02-13 DIAGNOSIS — Z0000 Encounter for general adult medical examination without abnormal findings: Secondary | ICD-10-CM | POA: Diagnosis not present

## 2022-02-13 DIAGNOSIS — Z1389 Encounter for screening for other disorder: Secondary | ICD-10-CM | POA: Diagnosis not present

## 2022-02-13 DIAGNOSIS — I77810 Thoracic aortic ectasia: Secondary | ICD-10-CM

## 2022-02-13 DIAGNOSIS — I712 Thoracic aortic aneurysm, without rupture, unspecified: Secondary | ICD-10-CM

## 2022-02-13 DIAGNOSIS — Z6827 Body mass index (BMI) 27.0-27.9, adult: Secondary | ICD-10-CM | POA: Diagnosis not present

## 2022-02-13 LAB — BASIC METABOLIC PANEL
BUN/Creatinine Ratio: 15 (ref 10–24)
BUN: 13 mg/dL (ref 8–27)
CO2: 27 mmol/L (ref 20–29)
Calcium: 9.1 mg/dL (ref 8.6–10.2)
Chloride: 103 mmol/L (ref 96–106)
Creatinine, Ser: 0.89 mg/dL (ref 0.76–1.27)
Glucose: 77 mg/dL (ref 70–99)
Potassium: 4.7 mmol/L (ref 3.5–5.2)
Sodium: 141 mmol/L (ref 134–144)
eGFR: 93 mL/min/{1.73_m2} (ref >59)

## 2022-02-16 DIAGNOSIS — I480 Paroxysmal atrial fibrillation: Secondary | ICD-10-CM | POA: Diagnosis not present

## 2022-02-16 DIAGNOSIS — E039 Hypothyroidism, unspecified: Secondary | ICD-10-CM | POA: Diagnosis not present

## 2022-02-16 DIAGNOSIS — E782 Mixed hyperlipidemia: Secondary | ICD-10-CM | POA: Diagnosis not present

## 2022-02-16 DIAGNOSIS — R7303 Prediabetes: Secondary | ICD-10-CM | POA: Diagnosis not present

## 2022-02-16 DIAGNOSIS — Z125 Encounter for screening for malignant neoplasm of prostate: Secondary | ICD-10-CM | POA: Diagnosis not present

## 2022-02-17 ENCOUNTER — Inpatient Hospital Stay: Admission: RE | Admit: 2022-02-17 | Payer: Medicare Other | Source: Ambulatory Visit

## 2022-03-06 ENCOUNTER — Other Ambulatory Visit: Payer: Self-pay

## 2022-03-06 MED ORDER — ATORVASTATIN CALCIUM 40 MG PO TABS: ORAL_TABLET | ORAL | 1 refills | Status: DC

## 2022-03-08 DIAGNOSIS — N200 Calculus of kidney: Secondary | ICD-10-CM | POA: Diagnosis not present

## 2022-03-08 DIAGNOSIS — R1031 Right lower quadrant pain: Secondary | ICD-10-CM | POA: Diagnosis not present

## 2022-03-09 ENCOUNTER — Encounter: Payer: Self-pay | Admitting: Physical Medicine & Rehabilitation

## 2022-03-09 ENCOUNTER — Encounter: Payer: Medicare Other | Attending: Physical Medicine & Rehabilitation | Admitting: Physical Medicine & Rehabilitation

## 2022-03-09 VITALS — Ht 73.0 in | Wt 209.0 lb

## 2022-03-09 DIAGNOSIS — G890 Central pain syndrome: Secondary | ICD-10-CM

## 2022-03-09 MED ORDER — PREGABALIN 200 MG PO CAPS: 200.0000 mg | ORAL_CAPSULE | Freq: Three times a day (TID) | ORAL | 1 refills | Status: DC

## 2022-03-09 MED ORDER — TRAMADOL HCL 50 MG PO TABS: ORAL_TABLET | ORAL | 1 refills | Status: DC

## 2022-03-09 NOTE — Progress Notes (Signed)
Subjective:    Patient ID: [name], male    DOB: 08-Nov-1955, 67 y.o.   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  Patient also has a history of persistent severe mixed aphasia. He has had workup of right lower extremity pain both vascular and lumbar MRI showing no other tensional causes of his lower extremity symptomatology.  Lyrica '200mg'$  TID Tramadol '100mg'$  QID Pain Inventory Average Pain 8 Pain Right Now 8 My pain is constant, dull, tingling, and aching  In the last 24 hours, has pain interfered with the following? General activity 8 Relation with others 6 Enjoyment of life 6 What TIME of day is your pain at its worst? daytime Sleep (in general) Fair  Pain is worse with: walking, bending, and standing Pain improves with: medication Relief from Meds: 4  Family History  Problem Relation Age of Onset   Cancer Mother    Heart failure Father    Stroke Brother    Social History   Socioeconomic History   Marital status: Married    Spouse name: [name]   Number of children: 2   Years of education: Not on file   Highest education level: Not on file  Occupational History   Not on file  Tobacco Use   Smoking status: Never   Smokeless tobacco: Never  Vaping Use   Vaping Use: Never used  Substance and Sexual Activity   Alcohol use: Yes    Alcohol/week: 1.0 standard drink of alcohol    Types: 1 Cans of beer per week    Comment: occasionally maybe the weekends   Drug use: No   Sexual activity: Not on file  Other Topics Concern   Not on file  Social History Narrative   08/02/21 lives with wife   Social Determinants of Health   Financial Resource Strain: Not on file  Food Insecurity: Not on file  Transportation Needs: Not on file  Physical Activity: Not on file  Stress: Not on file  Social Connections: Not on file   Past Surgical  History:  Procedure Laterality Date   [place] N/A 03/27/2017   Procedure: [place];  Surgeon: [name], MD;  Location: [facility];  Service: Cardiovascular;  Laterality: N/A;   LITHOTRIPSY     VASECTOMY     Past Surgical History:  Procedure Laterality Date   ATRIAL FIBRILLATION ABLATION N/A 03/27/2017   Procedure: ATRIAL FIBRILLATION ABLATION;  Surgeon: [name], MD;  Location: [facility];  Service: Cardiovascular;  Laterality: N/A;   LITHOTRIPSY     VASECTOMY     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   Ascending aorta dilatation ([place]) 10/20/2015   38 mm by echo 12/2020   Basal cell carcinoma    Bradycardia 10/21/2015   Contusion of calf, left, initial encounter 12/03/2015   CVA (cerebral vascular accident) [facility]) 2016   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   Left post branch MCA infarct hasRLE>> RUE symptoms    Dilated aortic root (HCC)    40 mm by echo 12/2020   Hyperlipidemia LDL goal <70 05/31/2016   ICAO (internal carotid artery occlusion), left 07/10/2016   chronically occluded left ICA and s/p right CEA at [facility]   Left middle cerebral artery stroke ([place]) 09/29/2014   Nephrolithiasis  Neuropathy    PAF (paroxysmal atrial fibrillation) ([place]) 07/10/2016   s/p afib ablation 02/2017   Typical atrial flutter (HCC)    Ht '6\' 1"'$  (1.854 m)   Wt 209 lb (94.8 kg)   BMI 27.57 kg/m   Opioid Risk Score:   Fall Risk Score:  `1  Depression screen PHQ 2/9     03/09/2022    1:54 PM 01/05/2022    1:51 PM 09/15/2021    2:22 PM 05/03/2021    2:30 PM 03/30/2021   11:11 AM 09/30/2020   10:46 AM 10/07/2019   11:05 AM  Depression screen PHQ 2/9  Decreased Interest 0 0 0 0 0 0 0  Down, Depressed, Hopeless 0  0 0 0 0 0  PHQ - 2 Score 0 0 0 0 0 0 0      Review of Systems  Musculoskeletal:  Positive for back pain.  All other systems reviewed  and are negative.     Objective:   Physical Exam  General no acute distress Mood and affect appropriate Speech with fluent aphasia word substitutions Positive limb apraxia noted during manual muscle testing. Sensation reduced to pinprick right upper and right lower limb versus left side he does have light touch sensation.  Due to his aphasia, difficult to assess temperature sensation Ambulates without assist device no evidence of toe drag and instability 5/5 strength bilateral upper and lower limbs.      Assessment & Plan:   1.  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  He does have sensory deficits still in the right upper extremity as well as persistent aphasia. We discussed his current regimen, while it does not abolish his pain it has alleviated his pain better than other regimens that have been trialed over the years.  He is tolerating it well from a side effect standpoint.  Will continue on the current regimen Tramadol 100 mg 4 times daily Lyrica 200 mg 3 times daily Nortriptyline 10 mg nightly Follow-up in 6 months with practitioner.  UDS positive for EtOH, written warning sent, will repeat next visit and if positive for alcohol will need to wean off tramadol

## 2022-03-16 DIAGNOSIS — H6123 Impacted cerumen, bilateral: Secondary | ICD-10-CM | POA: Diagnosis not present

## 2022-03-17 ENCOUNTER — Ambulatory Visit: Payer: Medicare Other | Admitting: Physical Medicine & Rehabilitation

## 2022-03-20 DIAGNOSIS — N200 Calculus of kidney: Secondary | ICD-10-CM | POA: Diagnosis not present

## 2022-03-20 DIAGNOSIS — N132 Hydronephrosis with renal and ureteral calculous obstruction: Secondary | ICD-10-CM | POA: Diagnosis not present

## 2022-03-20 DIAGNOSIS — K760 Fatty (change of) liver, not elsewhere classified: Secondary | ICD-10-CM | POA: Diagnosis not present

## 2022-03-20 DIAGNOSIS — N281 Cyst of kidney, acquired: Secondary | ICD-10-CM | POA: Diagnosis not present

## 2022-03-20 DIAGNOSIS — Q631 Lobulated, fused and horseshoe kidney: Secondary | ICD-10-CM | POA: Diagnosis not present

## 2022-03-20 DIAGNOSIS — N400 Enlarged prostate without lower urinary tract symptoms: Secondary | ICD-10-CM | POA: Diagnosis not present

## 2022-03-31 ENCOUNTER — Ambulatory Visit: Payer: Medicare Other | Admitting: Physical Medicine & Rehabilitation

## 2022-04-05 DIAGNOSIS — I4891 Unspecified atrial fibrillation: Secondary | ICD-10-CM | POA: Diagnosis not present

## 2022-04-05 DIAGNOSIS — D6869 Other thrombophilia: Secondary | ICD-10-CM | POA: Diagnosis not present

## 2022-04-05 DIAGNOSIS — K921 Melena: Secondary | ICD-10-CM | POA: Diagnosis not present

## 2022-04-05 DIAGNOSIS — M25562 Pain in left knee: Secondary | ICD-10-CM | POA: Diagnosis not present

## 2022-04-06 ENCOUNTER — Encounter: Payer: Medicare Other | Admitting: Physical Medicine & Rehabilitation

## 2022-04-06 DIAGNOSIS — K080 Exfoliation of teeth due to systemic causes: Secondary | ICD-10-CM | POA: Diagnosis not present

## 2022-04-07 DIAGNOSIS — N200 Calculus of kidney: Secondary | ICD-10-CM | POA: Diagnosis not present

## 2022-04-07 DIAGNOSIS — Q631 Lobulated, fused and horseshoe kidney: Secondary | ICD-10-CM | POA: Diagnosis not present

## 2022-04-07 DIAGNOSIS — R310 Gross hematuria: Secondary | ICD-10-CM | POA: Diagnosis not present

## 2022-05-01 DIAGNOSIS — H25813 Combined forms of age-related cataract, bilateral: Secondary | ICD-10-CM | POA: Diagnosis not present

## 2022-05-01 DIAGNOSIS — H40013 Open angle with borderline findings, low risk, bilateral: Secondary | ICD-10-CM | POA: Diagnosis not present

## 2022-05-01 DIAGNOSIS — H53461 Homonymous bilateral field defects, right side: Secondary | ICD-10-CM | POA: Diagnosis not present

## 2022-05-01 DIAGNOSIS — H04123 Dry eye syndrome of bilateral lacrimal glands: Secondary | ICD-10-CM | POA: Diagnosis not present

## 2022-05-01 DIAGNOSIS — H524 Presbyopia: Secondary | ICD-10-CM | POA: Diagnosis not present

## 2022-05-03 DIAGNOSIS — N200 Calculus of kidney: Secondary | ICD-10-CM | POA: Diagnosis not present

## 2022-05-19 DIAGNOSIS — Z6826 Body mass index (BMI) 26.0-26.9, adult: Secondary | ICD-10-CM | POA: Diagnosis not present

## 2022-05-19 DIAGNOSIS — I480 Paroxysmal atrial fibrillation: Secondary | ICD-10-CM | POA: Diagnosis not present

## 2022-05-19 DIAGNOSIS — E039 Hypothyroidism, unspecified: Secondary | ICD-10-CM | POA: Diagnosis not present

## 2022-05-19 DIAGNOSIS — Z7901 Long term (current) use of anticoagulants: Secondary | ICD-10-CM | POA: Diagnosis not present

## 2022-05-26 ENCOUNTER — Other Ambulatory Visit: Payer: Self-pay | Admitting: Registered Nurse

## 2022-05-26 DIAGNOSIS — R310 Gross hematuria: Secondary | ICD-10-CM | POA: Diagnosis not present

## 2022-05-26 DIAGNOSIS — N200 Calculus of kidney: Secondary | ICD-10-CM | POA: Diagnosis not present

## 2022-05-26 DIAGNOSIS — N138 Other obstructive and reflux uropathy: Secondary | ICD-10-CM | POA: Diagnosis not present

## 2022-05-26 DIAGNOSIS — N368 Other specified disorders of urethra: Secondary | ICD-10-CM | POA: Diagnosis not present

## 2022-05-26 DIAGNOSIS — N401 Enlarged prostate with lower urinary tract symptoms: Secondary | ICD-10-CM | POA: Diagnosis not present

## 2022-05-29 ENCOUNTER — Telehealth: Payer: Self-pay | Admitting: *Deleted

## 2022-05-29 DIAGNOSIS — K640 First degree hemorrhoids: Secondary | ICD-10-CM | POA: Diagnosis not present

## 2022-05-29 DIAGNOSIS — K625 Hemorrhage of anus and rectum: Secondary | ICD-10-CM | POA: Diagnosis not present

## 2022-05-29 DIAGNOSIS — K5901 Slow transit constipation: Secondary | ICD-10-CM | POA: Diagnosis not present

## 2022-05-29 NOTE — Telephone Encounter (Signed)
Left voice message for patient to give office a call back and ask for pre-op callback to get scheduled for a tele visit

## 2022-05-29 NOTE — Telephone Encounter (Signed)
Pharmacy please advise on holding Eliquis prior to colonoscopy scheduled for 07/10/2022. Thank you.   

## 2022-05-29 NOTE — Telephone Encounter (Signed)
   Pre-operative Risk Assessment    Patient Name: [name]  DOB: October 14, 1955 MRN: [id number]      Request for Surgical Clearance    Procedure:   Colonoscopy  Date of Surgery:  Clearance 07/10/22                                 Surgeon:  Dr. Watt Climes Surgeon's Group or Practice Name:  [facility] Phone number:  [phone] Fax number:  [phone]   Type of Clearance Requested:   - Medical  - Pharmacy:  Hold Apixaban (Eliquis) Not Indicated.   Type of Anesthesia:   Propofol   Additional requests/questions:    Signed, Greer Ee   05/29/2022, 12:18 PM

## 2022-05-29 NOTE — Telephone Encounter (Signed)
Patient with diagnosis of atrial fibrillation on Eliquis for anticoagulation.    Procedure: colonoscopy Date of procedure: 07/10/22   CHA2DS2-VASc Score = 4   This indicates a 4.8% annual risk of stroke. The patient's score is based upon: CHF History: 0 HTN History: 0 Diabetes History: 0 Stroke History: 2 Vascular Disease History: 1 Age Score: 1 Gender Score: 0    CrCl 105 Platelet count 238  Per office protocol, patient can hold Eliquis for 1 days prior to procedure.   Patient will not need bridging with Lovenox (enoxaparin) around procedure.  **This guidance is not considered finalized until pre-operative APP has relayed final recommendations.**

## 2022-05-29 NOTE — Telephone Encounter (Signed)
   Name: [name]  DOB: 1955/09/08  MRN: [id number]  Primary Cardiologist: Fransico Him, MD   Preoperative team, please contact this patient and set up a phone call appointment for further preoperative risk assessment. Please obtain consent and complete medication review. Thank you for your help.  I confirm that guidance regarding antiplatelet and oral anticoagulation therapy has been completed and, if necessary, noted below.  Per office protocol, patient can hold Eliquis for 1 days prior to procedure.   Patient will not need bridging with Lovenox (enoxaparin) around procedure.   Mable Fill, Marissa Nestle, NP 05/29/2022, 1:43 PM [place]

## 2022-05-31 ENCOUNTER — Telehealth: Payer: Self-pay | Admitting: Cardiology

## 2022-05-31 ENCOUNTER — Telehealth: Payer: Self-pay | Admitting: *Deleted

## 2022-05-31 NOTE — Telephone Encounter (Signed)
Pt's spouse was returning nurses call about being scheduled for tele visit. Please advise

## 2022-05-31 NOTE — Telephone Encounter (Signed)
S/w DPR pt's wife who has scheduled a tele pre op appt 06/19/22 10 am for the pt. Med rec and consent are done.     Patient Consent for Virtual Visit        [name] has provided verbal consent on 05/31/2022 for a virtual visit (video or telephone).   CONSENT FOR VIRTUAL VISIT FOR:  [name]  By participating in this virtual visit I agree to the following:  I hereby voluntarily request, consent and authorize [facility] and its employed or contracted physicians, physician assistants, nurse practitioners or other licensed health care professionals (the Practitioner), to provide me with telemedicine health care services (the "Services") as deemed necessary by the treating Practitioner. I acknowledge and consent to receive the Services by the Practitioner via telemedicine. I understand that the telemedicine visit will involve communicating with the Practitioner through live audiovisual communication technology and the disclosure of certain medical information by electronic transmission. I acknowledge that I have been given the opportunity to request an in-person assessment or other available alternative prior to the telemedicine visit and am voluntarily participating in the telemedicine visit.  I understand that I have the right to withhold or withdraw my consent to the use of telemedicine in the course of my care at any time, without affecting my right to future care or treatment, and that the Practitioner or I may terminate the telemedicine visit at any time. I understand that I have the right to inspect all information obtained and/or recorded in the course of the telemedicine visit and may receive copies of available information for a reasonable fee.  I understand that some of the potential risks of receiving the Services via telemedicine include:  Delay or interruption in medical evaluation due to technological equipment failure or disruption; Information transmitted  may not be sufficient (e.g. poor resolution of images) to allow for appropriate medical decision making by the Practitioner; and/or  In rare instances, security protocols could fail, causing a breach of personal health information.  Furthermore, I acknowledge that it is my responsibility to provide information about my medical history, conditions and care that is complete and accurate to the best of my ability. I acknowledge that Practitioner's advice, recommendations, and/or decision may be based on factors not within their control, such as incomplete or inaccurate data provided by me or distortions of diagnostic images or specimens that may result from electronic transmissions. I understand that the practice of medicine is not an exact science and that Practitioner makes no warranties or guarantees regarding treatment outcomes. I acknowledge that a copy of this consent can be made available to me via my patient portal ([facility]), or I can request a printed copy by calling the office of [facility].    I understand that my insurance will be billed for this visit.   I have read or had this consent read to me. I understand the contents of this consent, which adequately explains the benefits and risks of the Services being provided via telemedicine.  I have been provided ample opportunity to ask questions regarding this consent and the Services and have had my questions answered to my satisfaction. I give my informed consent for the services to be provided through the use of telemedicine in my medical care

## 2022-05-31 NOTE — Telephone Encounter (Signed)
S/w DPR pt's wife who has scheduled a tele pre op appt 06/19/22 10 am for the pt. Med rec and consent are done.

## 2022-06-18 NOTE — Progress Notes (Unsigned)
Virtual Visit via Telephone Note   Because of [name]'s co-morbid illnesses, he is at least at moderate risk for complications without adequate follow up.  This format is felt to be most appropriate for this patient at this time.  The patient did not have access to video technology/had technical difficulties with video requiring transitioning to audio format only (telephone).  All issues noted in this document were discussed and addressed.  No physical exam could be performed with this format.  Please refer to the patient's chart for his consent to telehealth for [facility].  Evaluation Performed:  Preoperative cardiovascular risk assessment _____________   Date:  06/19/2022   Patient ID:  [name], DOB 25-Aug-1955, MRN [id number] Patient Location:  Home Provider location:   Office  Primary Care Provider:  [facility] Primary Cardiologist:  Armanda Magic, MD  Chief Complaint / Patient Profile   67 y.o. y/o male with a h/o dilated aortic root, HLD, typical atrial flutter s/p ablation 2019 on chronic anticoagulation, CVA, carotid artery stenosis, chronic LLE DVT,  who is pending Colonoscopy and presents today for telephonic preoperative cardiovascular risk assessment.  History of Present Illness    [name] is a 67 y.o. male who presents via audio/video conferencing for a telehealth visit today.  Pt was last seen in cardiology clinic on 11/14/21 by Dr. Keitha Butte.  At that time [name] was doing well.  The patient is now pending procedure as outlined above. I spoke with patient's wife with patient in the background due to difficulty for him speaking on the phone. Since his last visit, he denies chest pain, shortness of breath, lower extremity edema, fatigue, palpitations, melena, hematuria, hemoptysis, diaphoresis, weakness, presyncope, syncope, orthopnea, and PND. He walks daily for exercise and is able to complete  housework to achieve > 4 METS activity without concerning cardiac symptoms.   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   Left post branch MCA infarct hasRLE>> RUE symptoms    Dilated aortic root (HCC)    40 mm by echo 12/2020   Hyperlipidemia LDL goal <70 05/31/2016   ICAO (internal carotid artery occlusion), left 07/10/2016   chronically occluded left ICA and s/p right CEA at [facility]   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   Nephrolithiasis    Neuropathy    PAF (paroxysmal atrial fibrillation) (HCC) 07/10/2016   s/p afib ablation 02/2017   Typical atrial flutter (HCC)    Past Surgical History:  Procedure Laterality Date   ATRIAL FIBRILLATION ABLATION N/A 03/27/2017   Procedure: ATRIAL FIBRILLATION ABLATION;  Surgeon: Hillis Range, MD;  Location: [facility];  Service: Cardiovascular;  Laterality: N/A;   LITHOTRIPSY     VASECTOMY      Allergies  Allergies  Allergen Reactions   Levofloxacin Rash    Other reaction(s): rash Other reaction(s): rash Other reaction(s): rash Other reaction(s): rash     Home Medications    Prior to Admission medications   Medication Sig Start Date End Date Taking? Authorizing Provider  acetaminophen (TYLENOL) 500  MG tablet Take 1,000 mg by mouth every 6 (six) hours as needed (for pain/headaches.).    [provider]  apixaban (ELIQUIS) 5 MG TABS tablet Take 1 tablet (5 mg total) by mouth 2 (two) times daily. 11/14/21   Sheilah Pigeon, PA-C  atorvastatin (LIPITOR) 40 MG tablet TAKE 1 TABLET DAILY AT 6  P.M. 03/06/22   Quintella Reichert, MD  fluticasone  (FLONASE) 50 MCG/ACT nasal spray Place 1 spray into both nostrils in the morning and at bedtime.    [provider]  levothyroxine (SYNTHROID) 88 MCG tablet Take 88 mcg by mouth daily. 11/14/21   [provider]  levothyroxine (SYNTHROID, LEVOTHROID) 25 MCG tablet Take 50 mcg by mouth daily before breakfast. Patient not taking: Reported on 05/31/2022 05/04/15   [provider]  Multiple Vitamins-Minerals (MULTIVITAMIN WITH MINERALS) tablet Take 1 tablet by mouth daily.    [provider]  nortriptyline (PAMELOR) 10 MG capsule TAKE TWO CAPSULES BY MOUTH AT BEDTIME 05/26/22   Kirsteins, Victorino Sparrow, MD  polyethylene glycol (MIRALAX / GLYCOLAX) packet Take 17 g by mouth daily.    [provider]  pregabalin (LYRICA) 200 MG capsule Take 1 capsule (200 mg total) by mouth 3 (three) times daily. 03/09/22   Kirsteins, Victorino Sparrow, MD  Probiotic Product (PROBIOTIC PO) Take 1 tablet by mouth at bedtime.    [provider]  propranolol (INDERAL) 10 MG tablet Take 1 tablet (10 mg total) by mouth daily as needed (palpitations). 11/14/21   Sheilah Pigeon, PA-C  senna (SENOKOT) 8.6 MG tablet Take 2 tablets by mouth 2 (two) times daily. Bedtime    [provider]  traMADol (ULTRAM) 50 MG tablet TAKE TWO TABLETS BY MOUTH FOUR TIMES A DAY 03/09/22   Kirsteins, Victorino Sparrow, MD  triamcinolone cream (KENALOG) 0.1 % Apply topically. Patient not taking: Reported on 05/31/2022 09/09/21   [provider]    Physical Exam    Vital Signs:  [name] does not have vital signs available for review today.  Given telephonic nature of communication, physical exam is limited. AAOx3. NAD. Normal affect.  Speech and respirations are unlabored.  Accessory Clinical Findings    None  Assessment & Plan    1.  Preoperative Cardiovascular Risk Assessment: According to the Revised Cardiac Risk Index (RCRI), his Perioperative Risk of Major Cardiac Event is (%): 0.9.  His Functional Capacity in METs is: 6.05 according to the [facility] Activity Status Index (DASI). The patient is doing well from a cardiac perspective. Therefore, based on ACC/AHA guidelines, the patient would be at acceptable risk for the planned procedure without further cardiovascular testing.   The patient was advised that if he develops new symptoms prior to surgery to contact our office to arrange for a follow-up visit, and he verbalized understanding.  Per office protocol, patient can hold Eliquis for 1 days prior to procedure.  Patient will not need bridging with Lovenox (enoxaparin) around procedure.  A copy of this note will be routed to requesting surgeon.  Time:   Today, I have spent 8 minutes with the patient with telehealth technology discussing medical history, symptoms, and management plan.    Levi Aland, NP-C  06/19/2022, 9:59 AM [address], [address] Office [phone] Fax [phone]

## 2022-06-19 ENCOUNTER — Encounter: Payer: Self-pay | Admitting: Nurse Practitioner

## 2022-06-19 ENCOUNTER — Ambulatory Visit: Payer: Medicare Other | Attending: Cardiology | Admitting: Nurse Practitioner

## 2022-06-19 DIAGNOSIS — Z01810 Encounter for preprocedural cardiovascular examination: Secondary | ICD-10-CM | POA: Diagnosis not present

## 2022-06-19 DIAGNOSIS — K080 Exfoliation of teeth due to systemic causes: Secondary | ICD-10-CM | POA: Diagnosis not present

## 2022-07-06 DIAGNOSIS — R5383 Other fatigue: Secondary | ICD-10-CM | POA: Diagnosis not present

## 2022-07-06 DIAGNOSIS — I480 Paroxysmal atrial fibrillation: Secondary | ICD-10-CM | POA: Diagnosis not present

## 2022-07-08 ENCOUNTER — Other Ambulatory Visit: Payer: Self-pay | Admitting: Physical Medicine & Rehabilitation

## 2022-07-10 ENCOUNTER — Other Ambulatory Visit: Payer: Self-pay | Admitting: *Deleted

## 2022-07-10 DIAGNOSIS — D122 Benign neoplasm of ascending colon: Secondary | ICD-10-CM | POA: Diagnosis not present

## 2022-07-10 DIAGNOSIS — K5730 Diverticulosis of large intestine without perforation or abscess without bleeding: Secondary | ICD-10-CM | POA: Diagnosis not present

## 2022-07-10 DIAGNOSIS — K921 Melena: Secondary | ICD-10-CM | POA: Diagnosis not present

## 2022-07-10 NOTE — Telephone Encounter (Signed)
[name] called a back. [name] will be out of Tramadol tonight. If granted please send the script to [facility] on [facility].  Thank you.

## 2022-07-11 ENCOUNTER — Other Ambulatory Visit: Payer: Self-pay | Admitting: Physical Medicine & Rehabilitation

## 2022-07-11 ENCOUNTER — Encounter: Payer: Self-pay | Admitting: Physical Medicine & Rehabilitation

## 2022-07-11 MED ORDER — TRAMADOL HCL 50 MG PO TABS: 100.0000 mg | ORAL_TABLET | Freq: Four times a day (QID) | ORAL | 5 refills | Status: DC

## 2022-07-12 DIAGNOSIS — D122 Benign neoplasm of ascending colon: Secondary | ICD-10-CM | POA: Diagnosis not present

## 2022-08-07 DIAGNOSIS — I639 Cerebral infarction, unspecified: Secondary | ICD-10-CM | POA: Diagnosis not present

## 2022-08-07 DIAGNOSIS — G4719 Other hypersomnia: Secondary | ICD-10-CM | POA: Diagnosis not present

## 2022-08-25 DIAGNOSIS — H7292 Unspecified perforation of tympanic membrane, left ear: Secondary | ICD-10-CM | POA: Diagnosis not present

## 2022-08-25 DIAGNOSIS — H938X3 Other specified disorders of ear, bilateral: Secondary | ICD-10-CM | POA: Diagnosis not present

## 2022-08-26 ENCOUNTER — Other Ambulatory Visit: Payer: Self-pay | Admitting: Cardiology

## 2022-08-28 DIAGNOSIS — G4733 Obstructive sleep apnea (adult) (pediatric): Secondary | ICD-10-CM | POA: Diagnosis not present

## 2022-08-28 DIAGNOSIS — I639 Cerebral infarction, unspecified: Secondary | ICD-10-CM | POA: Diagnosis not present

## 2022-08-29 ENCOUNTER — Ambulatory Visit: Payer: Medicare Other | Attending: Physician Assistant | Admitting: Physician Assistant

## 2022-08-29 ENCOUNTER — Encounter: Payer: Self-pay | Admitting: Physician Assistant

## 2022-08-29 VITALS — BP 126/70 | HR 71 | Ht 73.0 in | Wt 204.8 lb

## 2022-08-29 DIAGNOSIS — I77810 Thoracic aortic ectasia: Secondary | ICD-10-CM | POA: Diagnosis not present

## 2022-08-29 DIAGNOSIS — E785 Hyperlipidemia, unspecified: Secondary | ICD-10-CM | POA: Diagnosis not present

## 2022-08-29 DIAGNOSIS — I63412 Cerebral infarction due to embolism of left middle cerebral artery: Secondary | ICD-10-CM | POA: Diagnosis not present

## 2022-08-29 DIAGNOSIS — I712 Thoracic aortic aneurysm, without rupture, unspecified: Secondary | ICD-10-CM

## 2022-08-29 DIAGNOSIS — Z7901 Long term (current) use of anticoagulants: Secondary | ICD-10-CM

## 2022-08-29 DIAGNOSIS — I6523 Occlusion and stenosis of bilateral carotid arteries: Secondary | ICD-10-CM | POA: Diagnosis not present

## 2022-08-29 NOTE — Progress Notes (Signed)
Cardiology Office Note:  .   Date:  08/29/2022  ID:  [name], DOB 1955-07-17, MRN [id number] PCP: [facility] Providers Cardiologist:  Armanda Magic, MD Electrophysiologist:  Hillis Range, MD (Inactive) {  History of Present Illness: [name]   [name] is a 67 y.o. male with a past medical history of stroke, carotid artery stenosis, persistent atrial fibrillation status post ablation on chronic anticoagulation, dilated aortic root, and hyperlipidemia who presents today for follow-up appointment.  Has a history of carotid artery disease status post R CEA ([facility]), chronically occluded L ICA, follow-up with vascular surgery at [facility].  Calcium score of 0 by cardiac CT (02/2017).  Myoview 05/2014 with no ischemia.  Seen by general cardiology 10/13/2019 with Matthew Newcomer, PA-C at which time routine EP follow-up was recommended as previously scheduled.  He was seen by Dr. Johney Glass for EP follow-up on 11/15/2020 and was having worsening leg edema following a flight to [place].  Lower extremity ultrasound was ordered and showed what appeared to be chronic DVT was found in the left leg.  Patient advised her aspirin 81 mg daily in addition to Eliquis and was referred to [facility].  Echo 12/2020 revealed normal LV function and trivial leakiness of the mitral valve, mildly thickened aortic valve, mild enlargement of ascending aorta at 38 mm and mildly thickened aortic valve, mild enlargement of ascending aorta at 38 mm and aortic arch at 40 mm.  Slightly increased from chest CTA July 2020.  CTA 02/02/2021 was ordered for further evaluation and revealed mildly dilated aortic aneurysm 38 mm slightly increased from previous year 37 mm.  Annual imaging was recommended at that time.  Call our office 06/07/2021 with request to discontinue Eliquis.  Advised by Dr. Johney Glass that with history of atrial fibrillation and CHA2DS2-VASc score 4 and chronic DVT should continue  anticoagulation.  Was last seen by Matthew Gulling, NP 06/27/2021 and at that time had some difficulty with verbal expression due to stroke.  Reports he was walking for 40 minutes few times a week.  Doing some light weight and lifting and stretching.  Discussed Eliquis and the need to continue.  Denies chest pain, shortness of breath, lower extremity edema, fatigue, palpitations, melena, hematuria, hemoptysis, diaphoresis, weakness, presyncope, syncope, orthopnea, and PND.  Today, he has not had any cardiac issues. He has been to the urologist and had a colonoscopy due to bleeding in the urine and stool. Not a large amount luckily. He does have some right sided pain maybe due to kidney stones. His uric acid has also been elevated. Lipid panel and LFTs ordered today. He also needs some follow-up with EP.   Reports no shortness of breath nor dyspnea on exertion. Reports no chest pain, pressure, or tightness. No edema, orthopnea, PND. Reports no palpitations.    ROS: PERTINENT ROS IN HPI  Studies Reviewed: .       CT angio 12//23 IMPRESSION: 1. Stable to slightly increased dilatation of the proximal aortic arch measuring approximately 3.8-3.9 cm in maximum diameter (previously 3.6-3.7 cm). Recommend annual imaging followup by CTA or MRA. This recommendation follows 2010 ACCF/AHA/AATS/ACR/ASA/SCA/SCAI/SIR/STS/SVM Guidelines for the Diagnosis and Management of Patients with Thoracic Aortic Disease. Circulation.2010; 121: N829-F621. Aortic aneurysm NOS (ICD10-I71.9) 2. Stable 3-4 mm right middle lobe pulmonary nodule. This is stable since 2020 and therefore benign. 3. Stable hepatic steatosis.   Aortic aneurysm NOS (ICD10-I71.9). [place]     Physical Exam:  VS:  BP 126/70   Pulse 71   Ht 6\' 1"  (1.854 m)   Wt 204 lb 12.8 oz (92.9 kg)   SpO2 94%   BMI 27.02 kg/m    Wt Readings from Last 3 Encounters:  08/29/22 204 lb 12.8 oz (92.9 kg)  03/09/22 209 lb (94.8 kg)  01/05/22 206 lb (93.4  kg)    GEN: Well nourished, well developed in no acute distress NECK: No JVD; No carotid bruits CARDIAC: RRR, no murmurs, rubs, gallops RESPIRATORY:  Clear to auscultation without rales, wheezing or rhonchi  ABDOMEN: Soft, non-tender, non-distended EXTREMITIES:  No edema; No deformity   ASSESSMENT AND PLAN: .   1.  Thoracic aortic aneurysm -schedule CTA for December -Blood pressure well-controlled today, 126/70 -Continue current medications which include Eliquis 5 mg twice a day, Lipitor 40 mg daily  2.  Paroxysmal atrial fibrillation on chronic anticoagulation -no issues recently  3.  Bilateral carotid aortic stenosis -He follows with Dr. Andrey Campanile in [place]  -Serial ultrasounds to monitor  4.  CVA due to embolism of left middle cerebral artery   -No recent issues, stable at this time -He did follow-up with neurology but is now as needed   Dispo: Follow-up in a year with Dr. Mayford Knife or myself  Signed, Sharlene Dory, PA-C

## 2022-08-29 NOTE — Patient Instructions (Signed)
Medication Instructions:  Your physician recommends that you continue on your current medications as directed. Please refer to the Current Medication list given to you today.  *If you need a refill on your cardiac medications before your next appointment, please call your pharmacy*  Lab Work: Lipids and lft's with primary care in December If you have labs (blood work) drawn today and your tests are completely normal, you will receive your results only by: MyChart Message (if you have MyChart) OR A paper copy in the mail If you have any lab test that is abnormal or we need to change your treatment, we will call you to review the results.   Testing/Procedures: Your provider has recommended that you have a chest CTA.    Follow-Up: At [facility], you and your health needs are our priority.  As part of our continuing mission to provide you with exceptional heart care, we have created designated Provider Care Teams.  These Care Teams include your primary Cardiologist (physician) and Advanced Practice Providers (APPs -  Physician Assistants and Nurse Practitioners) who all work together to provide you with the care you need, when you need it.  Your next appointment:   3 month(s)  Provider:   Francis Dowse, PA-C   Schedule 6 month follow up with Dr Mayford Knife  Other Instructions Check your blood pressure daily, 1 hr after morning medications for 2 weeks, keep a log and send [place] the readings through mychart at the end of the 2 weeks.   Low-Sodium Eating Plan Salt (sodium) helps you keep a healthy balance of fluids in your body. Too much sodium can raise your blood pressure. It can also cause fluid and waste to be held in your body. Your health care provider or dietitian may recommend a low-sodium eating plan if you have high blood pressure (hypertension), kidney disease, liver disease, or heart failure. Eating less sodium can help lower your blood pressure and reduce swelling. It can also  protect your heart, liver, and kidneys. What are tips for following this plan? Reading food labels  Check food labels for the amount of sodium per serving. If you eat more than one serving, you must multiply the listed amount by the number of servings. Choose foods with less than 140 milligrams (mg) of sodium per serving. Avoid foods with 300 mg of sodium or more per serving. Always check how much sodium is in a product, even if the label says "unsalted" or "no salt added." Shopping  Buy products labeled as "low-sodium" or "no salt added." Buy fresh foods. Avoid canned foods and pre-made or frozen meals. Avoid canned, cured, or processed meats. Buy breads that have less than 80 mg of sodium per slice. Cooking  Eat more home-cooked food. Try to eat less restaurant, buffet, and fast food. Try not to add salt when you cook. Use salt-free seasonings or herbs instead of table salt or sea salt. Check with your provider or pharmacist before using salt substitutes. Cook with plant-based oils, such as canola, sunflower, or olive oil. Meal planning When eating at a restaurant, ask if your food can be made with less salt or no salt. Avoid dishes labeled as brined, pickled, cured, or smoked. Avoid dishes made with soy sauce, miso, or teriyaki sauce. Avoid foods that have monosodium glutamate (MSG) in them. MSG may be added to some restaurant food, sauces, soups, bouillon, and canned foods. Make meals that can be grilled, baked, poached, roasted, or steamed. These are often made with  less sodium. General information Try to limit your sodium intake to 1,500-2,300 mg each day, or the amount told by your provider. What foods should I eat? Fruits Fresh, frozen, or canned fruit. Fruit juice. Vegetables Fresh or frozen vegetables. "No salt added" canned vegetables. "No salt added" tomato sauce and paste. Low-sodium or reduced-sodium tomato and vegetable juice. Grains Low-sodium cereals, such as oats,  puffed wheat and rice, and shredded wheat. Low-sodium crackers. Unsalted rice. Unsalted pasta. Low-sodium bread. Whole grain breads and whole grain pasta. Meats and other proteins Fresh or frozen meat, poultry, seafood, and fish. These should have no added salt. Low-sodium canned tuna and salmon. Unsalted nuts. Dried peas, beans, and lentils without added salt. Unsalted canned beans. Eggs. Unsalted nut butters. Dairy Milk. Soy milk. Cheese that is naturally low in sodium, such as ricotta cheese, fresh mozzarella, or Swiss cheese. Low-sodium or reduced-sodium cheese. Cream cheese. Yogurt. Seasonings and condiments Fresh and dried herbs and spices. Salt-free seasonings. Low-sodium mustard and ketchup. Sodium-free salad dressing. Sodium-free light mayonnaise. Fresh or refrigerated horseradish. Lemon juice. Vinegar. Other foods Homemade, reduced-sodium, or low-sodium soups. Unsalted popcorn and pretzels. Low-salt or salt-free chips. The items listed above may not be all the foods and drinks you can have. Talk to a dietitian to learn more. What foods should I avoid? Vegetables Sauerkraut, pickled vegetables, and relishes. Olives. [place] fries. Onion rings. Regular canned vegetables, except low-sodium or reduced-sodium items. Regular canned tomato sauce and paste. Regular tomato and vegetable juice. Frozen vegetables in sauces. Grains Instant hot cereals. Bread stuffing, pancake, and biscuit mixes. Croutons. Seasoned rice or pasta mixes. Noodle soup cups. Boxed or frozen macaroni and cheese. Regular salted crackers. Self-rising flour. Meats and other proteins Meat or fish that is salted, canned, smoked, spiced, or pickled. Precooked or cured meat, such as sausages or meat loaves. Tomasa Blase. Ham. Pepperoni. Hot dogs. Corned beef. Chipped beef. Salt pork. Jerky. Pickled herring, anchovies, and sardines. Regular canned tuna. Salted nuts. Dairy Processed cheese and cheese spreads. Hard cheeses. Cheese curds.  Blue cheese. Feta cheese. String cheese. Regular cottage cheese. Buttermilk. Canned milk. Fats and oils Salted butter. Regular margarine. Ghee. Bacon fat. Seasonings and condiments Onion salt, garlic salt, seasoned salt, table salt, and sea salt. Canned and packaged gravies. Worcestershire sauce. Tartar sauce. Barbecue sauce. Teriyaki sauce. Soy sauce, including reduced-sodium soy sauce. Steak sauce. Fish sauce. Oyster sauce. Cocktail sauce. Horseradish that you find on the shelf. Regular ketchup and mustard. Meat flavorings and tenderizers. Bouillon cubes. Hot sauce. Pre-made or packaged marinades. Pre-made or packaged taco seasonings. Relishes. Regular salad dressings. Salsa. Other foods Salted popcorn and pretzels. Corn chips and puffs. Potato and tortilla chips. Canned or dried soups. Pizza. Frozen entrees and pot pies. The items listed above may not be all the foods and drinks you should avoid. Talk to a dietitian to learn more. This information is not intended to replace advice given to you by your health care provider. Make sure you discuss any questions you have with your health care provider. Document Revised: 03/02/2022 Document Reviewed: 03/02/2022 Elsevier Patient Education  2024 [employer]   Heart-Healthy Eating Plan Many factors influence your heart health, including eating and exercise habits. Heart health is also called coronary health. Coronary risk increases with abnormal blood fat (lipid) levels. A heart-healthy eating plan includes limiting unhealthy fats, increasing healthy fats, limiting salt (sodium) intake, and making other diet and lifestyle changes. What is my plan? Your health care provider may recommend that: You limit your fat intake  to _________% or less of your total calories each day. You limit your saturated fat intake to _________% or less of your total calories each day. You limit the amount of cholesterol in your diet to less than _________ mg per day. You  limit the amount of sodium in your diet to less than _________ mg per day. What are tips for following this plan? Cooking Cook foods using methods other than frying. Baking, boiling, grilling, and broiling are all good options. Other ways to reduce fat include: Removing the skin from poultry. Removing all visible fats from meats. Steaming vegetables in water or broth. Meal planning  At meals, imagine dividing your plate into fourths: Fill one-half of your plate with vegetables and green salads. Fill one-fourth of your plate with whole grains. Fill one-fourth of your plate with lean protein foods. Eat 2-4 cups of vegetables per day. One cup of vegetables equals 1 cup (91 g) broccoli or cauliflower florets, 2 medium carrots, 1 large bell pepper, 1 large sweet potato, 1 large tomato, 1 medium white potato, 2 cups (150 g) raw leafy greens. Eat 1-2 cups of fruit per day. One cup of fruit equals 1 small apple, 1 large banana, 1 cup (237 g) mixed fruit, 1 large orange,  cup (82 g) dried fruit, 1 cup (240 mL) 100% fruit juice. Eat more foods that contain soluble fiber. Examples include apples, broccoli, carrots, beans, peas, and barley. Aim to get 25-30 g of fiber per day. Increase your consumption of legumes, nuts, and seeds to 4-5 servings per week. One serving of dried beans or legumes equals  cup (90 g) cooked, 1 serving of nuts is  oz (12 almonds, 24 pistachios, or 7 walnut halves), and 1 serving of seeds equals  oz (8 g). Fats Choose healthy fats more often. Choose monounsaturated and polyunsaturated fats, such as olive and canola oils, avocado oil, flaxseeds, walnuts, almonds, and seeds. Eat more omega-3 fats. Choose salmon, mackerel, sardines, tuna, flaxseed oil, and ground flaxseeds. Aim to eat fish at least 2 times each week. Check food labels carefully to identify foods with trans fats or high amounts of saturated fat. Limit saturated fats. These are found in animal products, such as  meats, butter, and cream. Plant sources of saturated fats include palm oil, palm kernel oil, and coconut oil. Avoid foods with partially hydrogenated oils in them. These contain trans fats. Examples are stick margarine, some tub margarines, cookies, crackers, and other baked goods. Avoid fried foods. General information Eat more home-cooked food and less restaurant, buffet, and fast food. Limit or avoid alcohol. Limit foods that are high in added sugar and simple starches such as foods made using white refined flour (white breads, pastries, sweets). Lose weight if you are overweight. Losing just 5-10% of your body weight can help your overall health and prevent diseases such as diabetes and heart disease. Monitor your sodium intake, especially if you have high blood pressure. Talk with your health care provider about your sodium intake. Try to incorporate more vegetarian meals weekly. What foods should I eat? Fruits All fresh, canned (in natural juice), or frozen fruits. Vegetables Fresh or frozen vegetables (raw, steamed, roasted, or grilled). Green salads. Grains Most grains. Choose whole wheat and whole grains most of the time. Rice and pasta, including brown rice and pastas made with whole wheat. Meats and other proteins Lean, well-trimmed beef, veal, pork, and lamb. Chicken and [place] without skin. All fish and shellfish. Wild duck, rabbit, pheasant, and venison.  Egg whites or low-cholesterol egg substitutes. Dried beans, peas, lentils, and tofu. Seeds and most nuts. Dairy Low-fat or nonfat cheeses, including ricotta and mozzarella. Skim or 1% milk (liquid, powdered, or evaporated). Buttermilk made with low-fat milk. Nonfat or low-fat yogurt. Fats and oils Non-hydrogenated (trans-free) margarines. Vegetable oils, including soybean, sesame, sunflower, olive, avocado, peanut, safflower, corn, canola, and cottonseed. Salad dressings or mayonnaise made with a vegetable oil. Beverages Water  (mineral or sparkling). Coffee and tea. Unsweetened ice tea. Diet beverages. Sweets and desserts Sherbet, gelatin, and fruit ice. Small amounts of dark chocolate. Limit all sweets and desserts. Seasonings and condiments All seasonings and condiments. The items listed above may not be a complete list of foods and beverages you can eat. Contact a dietitian for more options. What foods should I avoid? Fruits Canned fruit in heavy syrup. Fruit in cream or butter sauce. Fried fruit. Limit coconut. Vegetables Vegetables cooked in cheese, cream, or butter sauce. Fried vegetables. Grains Breads made with saturated or trans fats, oils, or whole milk. Croissants. Sweet rolls. Donuts. High-fat crackers, such as cheese crackers and chips. Meats and other proteins Fatty meats, such as hot dogs, ribs, sausage, bacon, rib-eye roast or steak. High-fat deli meats, such as salami and bologna. Caviar. Domestic duck and goose. Organ meats, such as liver. Dairy Cream, sour cream, cream cheese, and creamed cottage cheese. Whole-milk cheeses. Whole or 2% milk (liquid, evaporated, or condensed). Whole buttermilk. Cream sauce or high-fat cheese sauce. Whole-milk yogurt. Fats and oils Meat fat, or shortening. Cocoa butter, hydrogenated oils, palm oil, coconut oil, palm kernel oil. Solid fats and shortenings, including bacon fat, salt pork, lard, and butter. Nondairy cream substitutes. Salad dressings with cheese or sour cream. Beverages Regular sodas and any drinks with added sugar. Sweets and desserts Frosting. Pudding. Cookies. Cakes. Pies. Milk chocolate or white chocolate. Buttered syrups. Full-fat ice cream or ice cream drinks. The items listed above may not be a complete list of foods and beverages to avoid. Contact a dietitian for more information. Summary Heart-healthy meal planning includes limiting unhealthy fats, increasing healthy fats, limiting salt (sodium) intake and making other diet and lifestyle  changes. Lose weight if you are overweight. Losing just 5-10% of your body weight can help your overall health and prevent diseases such as diabetes and heart disease. Focus on eating a balance of foods, including fruits and vegetables, low-fat or nonfat dairy, lean protein, nuts and legumes, whole grains, and heart-healthy oils and fats. This information is not intended to replace advice given to you by your health care provider. Make sure you discuss any questions you have with your health care provider. Document Revised: 03/21/2021 Document Reviewed: 03/21/2021 Elsevier Patient Education  2024 [employer].

## 2022-09-04 ENCOUNTER — Encounter: Payer: Medicare Other | Attending: Registered Nurse | Admitting: Registered Nurse

## 2022-09-04 ENCOUNTER — Encounter: Payer: Self-pay | Admitting: Registered Nurse

## 2022-09-04 VITALS — BP 124/65 | HR 85 | Ht 73.0 in | Wt 207.0 lb

## 2022-09-04 DIAGNOSIS — G8929 Other chronic pain: Secondary | ICD-10-CM | POA: Diagnosis not present

## 2022-09-04 DIAGNOSIS — G890 Central pain syndrome: Secondary | ICD-10-CM | POA: Insufficient documentation

## 2022-09-04 DIAGNOSIS — G609 Hereditary and idiopathic neuropathy, unspecified: Secondary | ICD-10-CM | POA: Diagnosis not present

## 2022-09-04 DIAGNOSIS — M25562 Pain in left knee: Secondary | ICD-10-CM | POA: Diagnosis not present

## 2022-09-04 DIAGNOSIS — Z79891 Long term (current) use of opiate analgesic: Secondary | ICD-10-CM | POA: Insufficient documentation

## 2022-09-04 DIAGNOSIS — G894 Chronic pain syndrome: Secondary | ICD-10-CM | POA: Insufficient documentation

## 2022-09-04 DIAGNOSIS — M7061 Trochanteric bursitis, right hip: Secondary | ICD-10-CM | POA: Insufficient documentation

## 2022-09-04 DIAGNOSIS — Z5181 Encounter for therapeutic drug level monitoring: Secondary | ICD-10-CM | POA: Insufficient documentation

## 2022-09-04 DIAGNOSIS — M25511 Pain in right shoulder: Secondary | ICD-10-CM | POA: Insufficient documentation

## 2022-09-04 MED ORDER — PREGABALIN 200 MG PO CAPS: 200.0000 mg | ORAL_CAPSULE | Freq: Three times a day (TID) | ORAL | 1 refills | Status: DC

## 2022-09-04 MED ORDER — TRAMADOL HCL 50 MG PO TABS: ORAL_TABLET | ORAL | 1 refills | Status: DC

## 2022-09-04 NOTE — Progress Notes (Signed)
Subjective:    Patient ID: ELZY TOMASELLO, male    DOB: [birth year], 67 y.o.   MRN: [id number]  HPI: CARTER KASSEL is a 67 y.o. male who returns for follow up appointment for chronic pain and medication refill. He states his pain is located in his right hip, right lower extremity and right foot pain. He rates his pain 8. His current exercise regime is walking and performing stretching exercises.  Mr. Gappa Morphine equivalent is 80.00 MME.   UDS ordered today.     Pain Inventory Average Pain 8 Pain Right Now 8 My pain is constant, burning, dull, tingling, and aching  In the last 24 hours, has pain interfered with the following? General activity 5 Relation with others 2 Enjoyment of life 5 What TIME of day is your pain at its worst? varies Sleep (in general) Fair  Pain is worse with: walking, bending, sitting, inactivity, standing, and some activites Pain improves with: rest Relief from Meds: 3  Family History  Problem Relation Age of Onset   Cancer Mother    Heart failure Father    Stroke Brother    Social History   Socioeconomic History   Marital status: Married    Spouse name: Fannie Knee   Number of children: 2   Years of education: Not on file   Highest education level: Not on file  Occupational History   Not on file  Tobacco Use   Smoking status: Never   Smokeless tobacco: Never  Vaping Use   Vaping Use: Never used  Substance and Sexual Activity   Alcohol use: Yes    Alcohol/week: 1.0 standard drink of alcohol    Types: 1 Cans of beer per week    Comment: occasionally maybe the weekends   Drug use: No   Sexual activity: Not on file  Other Topics Concern   Not on file  Social History Narrative   08/02/21 lives with wife   Social Determinants of Health   Financial Resource Strain: Not on file  Food Insecurity: Not on file  Transportation Needs: Not on file  Physical Activity: Not on file  Stress: Not on file  Social Connections: Not on file    Past Surgical History:  Procedure Laterality Date   ATRIAL FIBRILLATION ABLATION N/A 03/27/2017   Procedure: ATRIAL FIBRILLATION ABLATION;  Surgeon: Hillis Range, MD;  Location: [facility];  Service: Cardiovascular;  Laterality: N/A;   LITHOTRIPSY     VASECTOMY     Past Surgical History:  Procedure Laterality Date   ATRIAL FIBRILLATION ABLATION N/A 03/27/2017   Procedure: ATRIAL FIBRILLATION ABLATION;  Surgeon: Hillis Range, MD;  Location: [facility];  Service: Cardiovascular;  Laterality: N/A;   LITHOTRIPSY     VASECTOMY     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   38 mm by echo 12/2020   Basal cell carcinoma    Bradycardia 10/21/2015   Contusion of calf, left, initial encounter 12/03/2015   CVA (cerebral vascular accident) [facility]) 2016   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  Opioid Risk Score:   Fall Risk Score:  `1  Depression screen PHQ 2/9     03/09/2022    1:54 PM 01/05/2022    1:51 PM 09/15/2021    2:22 PM 05/03/2021    2:30 PM 03/30/2021   11:11 AM 09/30/2020   10:46 AM 10/07/2019   11:05 AM  Depression screen PHQ 2/9  Decreased Interest 0 0 0 0 0 0 0  Down, Depressed, Hopeless 0  0 0 0 0 0  PHQ - 2 Score 0 0 0 0 0 0 0     Review of Systems  Musculoskeletal:        RT side/leg pain Lt knee  All other systems  reviewed and are negative.      Objective:   Physical Exam Vitals and nursing note reviewed.  Constitutional:      Appearance: Normal appearance.  Cardiovascular:     Rate and Rhythm: Normal rate and regular rhythm.     Pulses: Normal pulses.     Heart sounds: Normal heart sounds.  Pulmonary:     Effort: Pulmonary effort is normal.     Breath sounds: Normal breath sounds.  Musculoskeletal:     Cervical back: Normal range of motion and neck supple.     Comments: Normal Muscle Bulk and Muscle Testing Reveals:  Upper Extremities: Full ROM and Muscle Strength 5/5  Lower Extremities: Full ROM and Muscle Strength 5/5 Arises from Table with ease Narrow Based  Gait     Skin:    General: Skin is warm.  Neurological:     Mental Status: He is alert and oriented to person, place, and time.  Psychiatric:        Mood and Affect: Mood normal.        Behavior: Behavior normal.         Assessment & Plan:  1. 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 #720 90 day supply and  Lyrica 200 mg one capsule three times a day #270. 09/04/2022 2. Chronic Pain: Continue Tramadol and Lyrica. Continue to Monitor.  We will continue the opioid monitoring program, this consists of regular clinic visits, examinations, urine drug screen, pill counts as well as use of [place] Controlled Substance Reporting system. A 12 month History has been reviewed on the [place] Controlled Substance Reporting System on 07/08/20224 3. Chronic Right Hip Pain: Continue HEP as tolerated. Continue current medication regimen. Continue to Monitor.  4. Right shoulder pain: RX: X-ray. Continue to Monitor 5.. Left Knee Pain: RX: X-ray. Continue to Monitor.   F/U in 6 months

## 2022-09-06 ENCOUNTER — Ambulatory Visit (HOSPITAL_BASED_OUTPATIENT_CLINIC_OR_DEPARTMENT_OTHER)
Admission: RE | Admit: 2022-09-06 | Discharge: 2022-09-06 | Disposition: A | Discharge status: Home / Self Care | Payer: Medicare Other | Source: Ambulatory Visit | Attending: Registered Nurse | Admitting: Registered Nurse

## 2022-09-06 DIAGNOSIS — G8929 Other chronic pain: Secondary | ICD-10-CM | POA: Insufficient documentation

## 2022-09-06 DIAGNOSIS — M25511 Pain in right shoulder: Secondary | ICD-10-CM | POA: Insufficient documentation

## 2022-09-06 DIAGNOSIS — M25562 Pain in left knee: Secondary | ICD-10-CM | POA: Insufficient documentation

## 2022-09-06 DIAGNOSIS — M25462 Effusion, left knee: Secondary | ICD-10-CM | POA: Diagnosis not present

## 2022-09-06 DIAGNOSIS — M1712 Unilateral primary osteoarthritis, left knee: Secondary | ICD-10-CM | POA: Diagnosis not present

## 2022-09-06 LAB — TOXASSURE SELECT,+ANTIDEPR,UR

## 2022-09-08 DIAGNOSIS — S46811A Strain of other muscles, fascia and tendons at shoulder and upper arm level, right arm, initial encounter: Secondary | ICD-10-CM | POA: Diagnosis not present

## 2022-09-08 DIAGNOSIS — E782 Mixed hyperlipidemia: Secondary | ICD-10-CM | POA: Diagnosis not present

## 2022-09-11 ENCOUNTER — Ambulatory Visit (HOSPITAL_BASED_OUTPATIENT_CLINIC_OR_DEPARTMENT_OTHER)
Admission: RE | Admit: 2022-09-11 | Discharge: 2022-09-11 | Disposition: A | Discharge status: Home / Self Care | Payer: Medicare Other | Source: Ambulatory Visit | Attending: Physician Assistant | Admitting: Physician Assistant

## 2022-09-11 ENCOUNTER — Encounter (HOSPITAL_BASED_OUTPATIENT_CLINIC_OR_DEPARTMENT_OTHER): Payer: Self-pay

## 2022-09-11 DIAGNOSIS — I712 Thoracic aortic aneurysm, without rupture, unspecified: Secondary | ICD-10-CM | POA: Insufficient documentation

## 2022-09-11 DIAGNOSIS — N281 Cyst of kidney, acquired: Secondary | ICD-10-CM | POA: Diagnosis not present

## 2022-09-11 DIAGNOSIS — I7121 Aneurysm of the ascending aorta, without rupture: Secondary | ICD-10-CM | POA: Diagnosis not present

## 2022-09-11 MED ORDER — IOHEXOL 350 MG/ML SOLN
100.0000 mL | Freq: Once | INTRAVENOUS | Status: AC | PRN
  Administered 2022-09-11: 100 mL via INTRAVENOUS

## 2022-09-12 DIAGNOSIS — G4733 Obstructive sleep apnea (adult) (pediatric): Secondary | ICD-10-CM | POA: Diagnosis not present

## 2022-09-15 ENCOUNTER — Other Ambulatory Visit: Payer: Self-pay | Admitting: Physical Medicine & Rehabilitation

## 2022-09-18 ENCOUNTER — Telehealth: Payer: Self-pay | Admitting: Registered Nurse

## 2022-09-18 DIAGNOSIS — H90A32 Mixed conductive and sensorineural hearing loss, unilateral, left ear with restricted hearing on the contralateral side: Secondary | ICD-10-CM | POA: Diagnosis not present

## 2022-09-18 DIAGNOSIS — H90A21 Sensorineural hearing loss, unilateral, right ear, with restricted hearing on the contralateral side: Secondary | ICD-10-CM | POA: Diagnosis not present

## 2022-09-18 NOTE — Telephone Encounter (Signed)
Patient's spouse would like referral to [facility] for the knee. She is inquiring if a muscle relaxer could be sent for shoulder.

## 2022-09-18 NOTE — Telephone Encounter (Signed)
Call placed to Mrs. Hoshino regrding Mr. Rodier X-ray. He can be scheduled with Dr Wynn Banker or  Ortho regarding the effusion, call placed no answer. Left message to return the call.

## 2022-09-19 DIAGNOSIS — M5030 Other cervical disc degeneration, unspecified cervical region: Secondary | ICD-10-CM | POA: Diagnosis not present

## 2022-09-19 DIAGNOSIS — M25511 Pain in right shoulder: Secondary | ICD-10-CM | POA: Diagnosis not present

## 2022-09-19 NOTE — Telephone Encounter (Signed)
Call was placed to Ms. Geddis, She will call [facility] to schedule an appointment. She verbalizes understanding.

## 2022-10-04 DIAGNOSIS — M25562 Pain in left knee: Secondary | ICD-10-CM | POA: Diagnosis not present

## 2022-10-04 DIAGNOSIS — M25511 Pain in right shoulder: Secondary | ICD-10-CM | POA: Diagnosis not present

## 2022-10-13 DIAGNOSIS — G4733 Obstructive sleep apnea (adult) (pediatric): Secondary | ICD-10-CM | POA: Diagnosis not present

## 2022-11-13 DIAGNOSIS — G4733 Obstructive sleep apnea (adult) (pediatric): Secondary | ICD-10-CM | POA: Diagnosis not present

## 2022-11-21 DIAGNOSIS — M25562 Pain in left knee: Secondary | ICD-10-CM | POA: Diagnosis not present

## 2022-11-21 DIAGNOSIS — M25511 Pain in right shoulder: Secondary | ICD-10-CM | POA: Diagnosis not present

## 2022-11-27 ENCOUNTER — Other Ambulatory Visit: Payer: Self-pay | Admitting: Physician Assistant

## 2022-11-28 DIAGNOSIS — I639 Cerebral infarction, unspecified: Secondary | ICD-10-CM | POA: Diagnosis not present

## 2022-11-28 DIAGNOSIS — G4733 Obstructive sleep apnea (adult) (pediatric): Secondary | ICD-10-CM | POA: Diagnosis not present

## 2022-11-28 NOTE — Telephone Encounter (Signed)
Prescription refill request for Eliquis received. Indication:afib Last office visit:7/24 Scr:0.89  12/23 Age: 66 Weight:93.9  kg  Prescription refilled

## 2022-11-30 DIAGNOSIS — N200 Calculus of kidney: Secondary | ICD-10-CM | POA: Diagnosis not present

## 2022-11-30 DIAGNOSIS — N4889 Other specified disorders of penis: Secondary | ICD-10-CM | POA: Diagnosis not present

## 2022-11-30 DIAGNOSIS — R102 Pelvic and perineal pain: Secondary | ICD-10-CM | POA: Diagnosis not present

## 2022-11-30 DIAGNOSIS — R1030 Lower abdominal pain, unspecified: Secondary | ICD-10-CM | POA: Diagnosis not present

## 2022-12-06 ENCOUNTER — Ambulatory Visit: Payer: Medicare Other | Admitting: Physician Assistant

## 2023-01-01 ENCOUNTER — Encounter: Payer: Self-pay | Admitting: Physician Assistant

## 2023-01-01 ENCOUNTER — Ambulatory Visit: Payer: Medicare Other | Attending: Physician Assistant | Admitting: Physician Assistant

## 2023-01-01 VITALS — BP 142/90 | HR 70 | Ht 73.0 in | Wt 200.8 lb

## 2023-01-01 DIAGNOSIS — D6869 Other thrombophilia: Secondary | ICD-10-CM

## 2023-01-01 DIAGNOSIS — I4892 Unspecified atrial flutter: Secondary | ICD-10-CM | POA: Diagnosis not present

## 2023-01-01 DIAGNOSIS — I480 Paroxysmal atrial fibrillation: Secondary | ICD-10-CM | POA: Diagnosis not present

## 2023-01-01 NOTE — Patient Instructions (Signed)
Medication Instructions:  NO CHANGES *If you need a refill on your cardiac medications before your next appointment, please call your pharmacy*   Lab Work: NONE If you have labs (blood work) drawn today and your tests are completely normal, you will receive your results only by: MyChart Message (if you have MyChart) OR A paper copy in the mail If you have any lab test that is abnormal or we need to change your treatment, we will call you to review the results.   Testing/Procedures: NONE   Follow-Up: At [facility], you and your health needs are our priority.  As part of our continuing mission to provide you with exceptional heart care, we have created designated Provider Care Teams.  These Care Teams include your primary Cardiologist (physician) and Advanced Practice Providers (APPs -  Physician Assistants and Nurse Practitioners) who all work together to provide you with the care you need, when you need it.  We recommend signing up for the patient portal called "MyChart".  Sign up information is provided on this After Visit Summary.  MyChart is used to connect with patients for Virtual Visits (Telemedicine).  Patients are able to view lab/test results, encounter notes, upcoming appointments, etc.  Non-urgent messages can be sent to your provider as well.   To learn more about what you can do with MyChart, go to [email or web address].    Your next appointment:   1 year(s)  Provider:  DR Nelly Laurence   Other Instructions NONE

## 2023-01-02 DIAGNOSIS — K080 Exfoliation of teeth due to systemic causes: Secondary | ICD-10-CM | POA: Diagnosis not present

## 2023-01-05 DIAGNOSIS — M25511 Pain in right shoulder: Secondary | ICD-10-CM | POA: Diagnosis not present

## 2023-01-08 DIAGNOSIS — M6208 Separation of muscle (nontraumatic), other site: Secondary | ICD-10-CM | POA: Diagnosis not present

## 2023-01-08 DIAGNOSIS — R1031 Right lower quadrant pain: Secondary | ICD-10-CM | POA: Diagnosis not present

## 2023-01-08 DIAGNOSIS — R102 Pelvic and perineal pain: Secondary | ICD-10-CM | POA: Diagnosis not present

## 2023-01-08 DIAGNOSIS — G8929 Other chronic pain: Secondary | ICD-10-CM | POA: Diagnosis not present

## 2023-01-09 DIAGNOSIS — Z85828 Personal history of other malignant neoplasm of skin: Secondary | ICD-10-CM | POA: Diagnosis not present

## 2023-01-09 DIAGNOSIS — L821 Other seborrheic keratosis: Secondary | ICD-10-CM | POA: Diagnosis not present

## 2023-01-09 DIAGNOSIS — D485 Neoplasm of uncertain behavior of skin: Secondary | ICD-10-CM | POA: Diagnosis not present

## 2023-01-09 DIAGNOSIS — D225 Melanocytic nevi of trunk: Secondary | ICD-10-CM | POA: Diagnosis not present

## 2023-01-11 DIAGNOSIS — R1031 Right lower quadrant pain: Secondary | ICD-10-CM | POA: Diagnosis not present

## 2023-01-17 DIAGNOSIS — R102 Pelvic and perineal pain: Secondary | ICD-10-CM | POA: Diagnosis not present

## 2023-01-17 DIAGNOSIS — R1031 Right lower quadrant pain: Secondary | ICD-10-CM | POA: Diagnosis not present

## 2023-01-17 DIAGNOSIS — G8929 Other chronic pain: Secondary | ICD-10-CM | POA: Diagnosis not present

## 2023-01-17 DIAGNOSIS — M6208 Separation of muscle (nontraumatic), other site: Secondary | ICD-10-CM | POA: Diagnosis not present

## 2023-01-22 DIAGNOSIS — M6208 Separation of muscle (nontraumatic), other site: Secondary | ICD-10-CM | POA: Diagnosis not present

## 2023-01-22 DIAGNOSIS — R1031 Right lower quadrant pain: Secondary | ICD-10-CM | POA: Diagnosis not present

## 2023-01-22 DIAGNOSIS — R102 Pelvic and perineal pain: Secondary | ICD-10-CM | POA: Diagnosis not present

## 2023-01-22 DIAGNOSIS — G8929 Other chronic pain: Secondary | ICD-10-CM | POA: Diagnosis not present

## 2023-01-23 DIAGNOSIS — M25511 Pain in right shoulder: Secondary | ICD-10-CM | POA: Diagnosis not present

## 2023-01-23 DIAGNOSIS — M47812 Spondylosis without myelopathy or radiculopathy, cervical region: Secondary | ICD-10-CM | POA: Diagnosis not present

## 2023-01-23 DIAGNOSIS — M542 Cervicalgia: Secondary | ICD-10-CM | POA: Diagnosis not present

## 2023-01-23 DIAGNOSIS — M5030 Other cervical disc degeneration, unspecified cervical region: Secondary | ICD-10-CM | POA: Diagnosis not present

## 2023-01-29 DIAGNOSIS — M6208 Separation of muscle (nontraumatic), other site: Secondary | ICD-10-CM | POA: Diagnosis not present

## 2023-01-29 DIAGNOSIS — G8929 Other chronic pain: Secondary | ICD-10-CM | POA: Diagnosis not present

## 2023-01-29 DIAGNOSIS — R102 Pelvic and perineal pain: Secondary | ICD-10-CM | POA: Diagnosis not present

## 2023-01-29 DIAGNOSIS — R1031 Right lower quadrant pain: Secondary | ICD-10-CM | POA: Diagnosis not present

## 2023-01-30 DIAGNOSIS — R7303 Prediabetes: Secondary | ICD-10-CM | POA: Diagnosis not present

## 2023-01-30 DIAGNOSIS — Z125 Encounter for screening for malignant neoplasm of prostate: Secondary | ICD-10-CM | POA: Diagnosis not present

## 2023-01-30 DIAGNOSIS — E782 Mixed hyperlipidemia: Secondary | ICD-10-CM | POA: Diagnosis not present

## 2023-01-30 DIAGNOSIS — E039 Hypothyroidism, unspecified: Secondary | ICD-10-CM | POA: Diagnosis not present

## 2023-01-30 DIAGNOSIS — I480 Paroxysmal atrial fibrillation: Secondary | ICD-10-CM | POA: Diagnosis not present

## 2023-01-30 DIAGNOSIS — Z0000 Encounter for general adult medical examination without abnormal findings: Secondary | ICD-10-CM | POA: Diagnosis not present

## 2023-02-05 DIAGNOSIS — R1031 Right lower quadrant pain: Secondary | ICD-10-CM | POA: Diagnosis not present

## 2023-02-05 DIAGNOSIS — G8929 Other chronic pain: Secondary | ICD-10-CM | POA: Diagnosis not present

## 2023-02-05 DIAGNOSIS — M6208 Separation of muscle (nontraumatic), other site: Secondary | ICD-10-CM | POA: Diagnosis not present

## 2023-02-05 DIAGNOSIS — R102 Pelvic and perineal pain: Secondary | ICD-10-CM | POA: Diagnosis not present

## 2023-02-10 DIAGNOSIS — J019 Acute sinusitis, unspecified: Secondary | ICD-10-CM | POA: Diagnosis not present

## 2023-02-26 DIAGNOSIS — Z0000 Encounter for general adult medical examination without abnormal findings: Secondary | ICD-10-CM | POA: Diagnosis not present

## 2023-02-26 DIAGNOSIS — Z1331 Encounter for screening for depression: Secondary | ICD-10-CM | POA: Diagnosis not present

## 2023-02-26 DIAGNOSIS — Z23 Encounter for immunization: Secondary | ICD-10-CM | POA: Diagnosis not present

## 2023-02-27 DIAGNOSIS — M542 Cervicalgia: Secondary | ICD-10-CM | POA: Diagnosis not present

## 2023-03-05 DIAGNOSIS — N4889 Other specified disorders of penis: Secondary | ICD-10-CM | POA: Diagnosis not present

## 2023-03-05 DIAGNOSIS — N200 Calculus of kidney: Secondary | ICD-10-CM | POA: Diagnosis not present

## 2023-03-05 DIAGNOSIS — M542 Cervicalgia: Secondary | ICD-10-CM | POA: Diagnosis not present

## 2023-03-05 DIAGNOSIS — N368 Other specified disorders of urethra: Secondary | ICD-10-CM | POA: Diagnosis not present

## 2023-03-05 DIAGNOSIS — M160 Bilateral primary osteoarthritis of hip: Secondary | ICD-10-CM | POA: Diagnosis not present

## 2023-03-07 DIAGNOSIS — M1712 Unilateral primary osteoarthritis, left knee: Secondary | ICD-10-CM | POA: Diagnosis not present

## 2023-03-07 DIAGNOSIS — M542 Cervicalgia: Secondary | ICD-10-CM | POA: Diagnosis not present

## 2023-03-07 DIAGNOSIS — M47812 Spondylosis without myelopathy or radiculopathy, cervical region: Secondary | ICD-10-CM | POA: Diagnosis not present

## 2023-03-07 DIAGNOSIS — M5030 Other cervical disc degeneration, unspecified cervical region: Secondary | ICD-10-CM | POA: Diagnosis not present

## 2023-03-07 DIAGNOSIS — M25511 Pain in right shoulder: Secondary | ICD-10-CM | POA: Diagnosis not present

## 2023-03-08 ENCOUNTER — Encounter: Payer: Self-pay | Admitting: Physical Medicine & Rehabilitation

## 2023-03-08 ENCOUNTER — Encounter: Payer: Medicare Other | Attending: Physical Medicine & Rehabilitation | Admitting: Physical Medicine & Rehabilitation

## 2023-03-08 VITALS — BP 141/76 | HR 70 | Ht 73.0 in | Wt 200.0 lb

## 2023-03-08 DIAGNOSIS — G890 Central pain syndrome: Secondary | ICD-10-CM | POA: Insufficient documentation

## 2023-03-08 MED ORDER — PREGABALIN 200 MG PO CAPS: 200.0000 mg | ORAL_CAPSULE | Freq: Three times a day (TID) | ORAL | 1 refills | Status: DC

## 2023-03-08 MED ORDER — NORTRIPTYLINE HCL 10 MG PO CAPS: ORAL_CAPSULE | ORAL | 1 refills | Status: DC

## 2023-03-08 NOTE — Progress Notes (Signed)
 Subjective:    Patient ID: [name], male    DOB: 14-Oct-1955, 68 y.o.   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   Patient has been tried on various combinations of medications both narcotic as well as neuropathic pain medications.  On his current regimen for several years.  He has had no side effects with his current regimen. Lyrica  200 mg 3 times daily Tramadol  100 mg 4 times daily Last UDS in July and was appropriate.  Pill counts have been appropriate Pain Inventory Average Pain 7 Pain Right Now 7 My pain is constant and burning  In the last 24 hours, has pain interfered with the following? General activity 3 Relation with others 3 Enjoyment of life 3 What TIME of day is your pain at its worst? morning , daytime, evening, and night Sleep (in general) Good  Pain is worse with: walking, bending, sitting, standing, and some activites Pain improves with: rest and medication Relief from Meds: 5  Family History  Problem Relation Age of Onset   Cancer Mother    Heart failure Father    Stroke Brother    Social History   Socioeconomic History   Marital status: Married    Spouse name: [name]   Number of children: 2   Years of education: Not on file   Highest education level: Not on file  Occupational History   Not on file  Tobacco Use   Smoking status: Never   Smokeless tobacco: Never  Vaping Use   Vaping status: Never Used  Substance and Sexual Activity   Alcohol use: Yes    Alcohol/week: 1.0 standard drink of alcohol    Types: 1 Cans of beer per week    Comment: occasionally maybe the weekends   Drug use: No   Sexual activity: Not on file  Other Topics Concern   Not on file  Social History Narrative   08/02/21 lives with wife   Social Drivers of Health   Financial  Resource Strain: Not on file  Food Insecurity: Not on file  Transportation Needs: Not on file  Physical Activity: Not on file  Stress: Not on file  Social Connections: Unknown (07/12/2021)   [facility]   Social Network    Social Network: Not on file   Past Surgical History:  Procedure Laterality Date   ATRIAL FIBRILLATION ABLATION N/A 03/27/2017   Procedure: ATRIAL FIBRILLATION ABLATION;  Surgeon: Kelsie Agent, MD;  Location: [facility];  Service: Cardiovascular;  Laterality: N/A;   LITHOTRIPSY     VASECTOMY     Past Surgical History:  Procedure Laterality Date   ATRIAL FIBRILLATION ABLATION N/A 03/27/2017   Procedure: ATRIAL FIBRILLATION ABLATION;  Surgeon: Kelsie Agent, MD;  Location: [facility];  Service: Cardiovascular;  Laterality: N/A;   LITHOTRIPSY     VASECTOMY     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   38 mm by echo 12/2020   Basal cell carcinoma    Bradycardia 10/21/2015   Contusion of calf, left, initial encounter 12/03/2015   CVA (cerebral vascular accident) [facility]) 2016   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   Left post branch MCA infarct hasRLE>> RUE symptoms  Dilated aortic root (HCC)    40 mm by echo 12/2020   Hyperlipidemia LDL goal <70 05/31/2016   ICAO (internal carotid artery occlusion), left 07/10/2016   chronically occluded left ICA and s/p right CEA at [facility]   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   Nephrolithiasis    Neuropathy    PAF (paroxysmal atrial fibrillation) (HCC) 07/10/2016   s/p afib ablation 02/2017   Typical atrial flutter (HCC)    Ht 6' 1 (1.854 m)   Wt 200 lb (90.7 kg)   BMI 26.39 kg/m   Opioid Risk Score:   Fall Risk Score:  `1  Depression screen PHQ 2/9     03/08/2023    2:06 PM 03/09/2022    1:54 PM 01/05/2022    1:51 PM 09/15/2021     2:22 PM 05/03/2021    2:30 PM 03/30/2021   11:11 AM 09/30/2020   10:46 AM  Depression screen PHQ 2/9  Decreased Interest 0 0 0 0 0 0 0  Down, Depressed, Hopeless 0 0  0 0 0 0  PHQ - 2 Score 0 0 0 0 0 0 0     Review of Systems  Musculoskeletal:  Positive for gait problem and neck pain.       Right shoulder pain   All other systems reviewed and are negative.     Objective:   Physical Exam  General No acute distress Mood and affect are improved. Motor strength is 5/5 left deltoid bicep tricep grip hip flexor knee extensor ankle dorsiflexor 4/5 in the right deltoid bicep tricep grip hip flexion knee extension ankle dorsiflexion Sensation is reduced to pinprick in the right upper limb and right left lower limb compared to left side.  Sensory testing is limited by aphasia. Patient has naming difficulties he cannot name individual fingers and sometimes calls his leg his arm and vice versa. He has evidence of ideomotor apraxia during manual muscle testing where he cannot follow flexion versus extension testing or gripping fingers appropriately during grip strength testing Ambulates without assistive device no evidence of toe drag      Assessment & Plan:   1.  Left MCA distribution infarct causing primarily receptive during the expressive aphasia as well as apraxia as well as right sided sensory deficits. 2.  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

## 2023-03-13 DIAGNOSIS — M542 Cervicalgia: Secondary | ICD-10-CM | POA: Diagnosis not present

## 2023-03-15 DIAGNOSIS — M542 Cervicalgia: Secondary | ICD-10-CM | POA: Diagnosis not present

## 2023-03-16 DIAGNOSIS — H6123 Impacted cerumen, bilateral: Secondary | ICD-10-CM | POA: Diagnosis not present

## 2023-03-16 DIAGNOSIS — H7292 Unspecified perforation of tympanic membrane, left ear: Secondary | ICD-10-CM | POA: Diagnosis not present

## 2023-03-19 DIAGNOSIS — M542 Cervicalgia: Secondary | ICD-10-CM | POA: Diagnosis not present

## 2023-03-21 DIAGNOSIS — M542 Cervicalgia: Secondary | ICD-10-CM | POA: Diagnosis not present

## 2023-03-28 DIAGNOSIS — M542 Cervicalgia: Secondary | ICD-10-CM | POA: Diagnosis not present

## 2023-03-30 DIAGNOSIS — M542 Cervicalgia: Secondary | ICD-10-CM | POA: Diagnosis not present

## 2023-03-31 ENCOUNTER — Other Ambulatory Visit: Payer: Self-pay | Admitting: Registered Nurse

## 2023-04-02 ENCOUNTER — Telehealth: Payer: Self-pay | Admitting: Registered Nurse

## 2023-04-02 MED ORDER — TRAMADOL HCL 50 MG PO TABS: ORAL_TABLET | ORAL | 1 refills | Status: DC

## 2023-04-02 NOTE — Telephone Encounter (Signed)
PMP was Reviewed Tramadol e-scribed to pharmacy.  Call placed to [name] regarding the above, she verbalizes understanding.

## 2023-05-04 DIAGNOSIS — H524 Presbyopia: Secondary | ICD-10-CM | POA: Diagnosis not present

## 2023-05-04 DIAGNOSIS — H0100B Unspecified blepharitis left eye, upper and lower eyelids: Secondary | ICD-10-CM | POA: Diagnosis not present

## 2023-05-04 DIAGNOSIS — H0100A Unspecified blepharitis right eye, upper and lower eyelids: Secondary | ICD-10-CM | POA: Diagnosis not present

## 2023-05-04 DIAGNOSIS — H40013 Open angle with borderline findings, low risk, bilateral: Secondary | ICD-10-CM | POA: Diagnosis not present

## 2023-05-04 DIAGNOSIS — H53461 Homonymous bilateral field defects, right side: Secondary | ICD-10-CM | POA: Diagnosis not present

## 2023-05-25 DIAGNOSIS — R21 Rash and other nonspecific skin eruption: Secondary | ICD-10-CM | POA: Diagnosis not present

## 2023-05-25 DIAGNOSIS — T63461A Toxic effect of venom of wasps, accidental (unintentional), initial encounter: Secondary | ICD-10-CM | POA: Diagnosis not present

## 2023-05-25 DIAGNOSIS — L259 Unspecified contact dermatitis, unspecified cause: Secondary | ICD-10-CM | POA: Diagnosis not present

## 2023-06-04 ENCOUNTER — Telehealth: Payer: Self-pay

## 2023-06-04 DIAGNOSIS — Q631 Lobulated, fused and horseshoe kidney: Secondary | ICD-10-CM | POA: Diagnosis not present

## 2023-06-04 DIAGNOSIS — N200 Calculus of kidney: Secondary | ICD-10-CM | POA: Diagnosis not present

## 2023-06-04 DIAGNOSIS — N281 Cyst of kidney, acquired: Secondary | ICD-10-CM | POA: Diagnosis not present

## 2023-06-04 DIAGNOSIS — N35911 Unspecified urethral stricture, male, meatal: Secondary | ICD-10-CM | POA: Diagnosis not present

## 2023-06-04 NOTE — Telephone Encounter (Signed)
 Needs Pregabalin early, going out of town on 06/08/23 for 2 weeks and pharmacy can't fill til the morning of the 11th and he leaves before pharmacy opens. Can you give permission for the pharmacy to fill on 06/07/23?

## 2023-06-06 NOTE — Telephone Encounter (Signed)
 done

## 2023-07-02 DIAGNOSIS — R4701 Aphasia: Secondary | ICD-10-CM | POA: Diagnosis not present

## 2023-07-02 DIAGNOSIS — I6522 Occlusion and stenosis of left carotid artery: Secondary | ICD-10-CM | POA: Diagnosis not present

## 2023-07-02 DIAGNOSIS — I6523 Occlusion and stenosis of bilateral carotid arteries: Secondary | ICD-10-CM | POA: Diagnosis not present

## 2023-07-02 DIAGNOSIS — I6521 Occlusion and stenosis of right carotid artery: Secondary | ICD-10-CM | POA: Diagnosis not present

## 2023-07-16 DIAGNOSIS — H0100A Unspecified blepharitis right eye, upper and lower eyelids: Secondary | ICD-10-CM | POA: Diagnosis not present

## 2023-07-16 DIAGNOSIS — B880 Other acariasis: Secondary | ICD-10-CM | POA: Diagnosis not present

## 2023-07-16 DIAGNOSIS — H0100B Unspecified blepharitis left eye, upper and lower eyelids: Secondary | ICD-10-CM | POA: Diagnosis not present

## 2023-07-16 DIAGNOSIS — H40013 Open angle with borderline findings, low risk, bilateral: Secondary | ICD-10-CM | POA: Diagnosis not present

## 2023-07-31 DIAGNOSIS — K080 Exfoliation of teeth due to systemic causes: Secondary | ICD-10-CM | POA: Diagnosis not present

## 2023-08-11 ENCOUNTER — Encounter (HOSPITAL_BASED_OUTPATIENT_CLINIC_OR_DEPARTMENT_OTHER): Payer: Self-pay

## 2023-08-11 ENCOUNTER — Emergency Department (HOSPITAL_BASED_OUTPATIENT_CLINIC_OR_DEPARTMENT_OTHER)
Admission: EM | Admit: 2023-08-11 | Discharge: 2023-08-11 | Disposition: A | Discharge status: Home / Self Care | Attending: Emergency Medicine | Admitting: Emergency Medicine

## 2023-08-11 ENCOUNTER — Other Ambulatory Visit: Payer: Self-pay

## 2023-08-11 DIAGNOSIS — R339 Retention of urine, unspecified: Secondary | ICD-10-CM | POA: Diagnosis not present

## 2023-08-11 DIAGNOSIS — K5900 Constipation, unspecified: Secondary | ICD-10-CM | POA: Diagnosis not present

## 2023-08-11 DIAGNOSIS — Z7901 Long term (current) use of anticoagulants: Secondary | ICD-10-CM | POA: Diagnosis not present

## 2023-08-11 DIAGNOSIS — K5641 Fecal impaction: Secondary | ICD-10-CM | POA: Diagnosis not present

## 2023-08-11 MED ORDER — FLEET ENEMA RE ENEM
1.0000 | ENEMA | Freq: Once | RECTAL | Status: AC
  Administered 2023-08-11: 1 via RECTAL
  Filled 2023-08-11: qty 1

## 2023-08-11 NOTE — ED Provider Notes (Signed)
 [facility] Provider Note   CSN: [id number] Arrival date & time: 08/11/23  1654     Patient presents with: Constipation and Urinary Retention   [name] is a 68 y.o. male.   68 yo M with a chief complaints of constipation and difficulty urinating.  He has had trouble with constipation off and on for some time.  He takes chronic pain medicine for right leg pain after having a stroke.  He however noticed that he is also struggled to urinate for the past 12 to 24 hours.  Has never had a problem like that before.  Feels like his right leg may be hurts little bit more than it normally does but otherwise denies loss of perirectal sensation denies perirectal numbness.   Constipation      Prior to Admission medications   Medication Sig Start Date End Date Taking? Authorizing Provider  acetaminophen  (TYLENOL ) 500 MG tablet Take 1,000 mg by mouth every 6 (six) hours as needed (for pain/headaches.).    [provider]  atorvastatin  (LIPITOR) 40 MG tablet TAKE 1 TABLET DAILY AT 6 P.M. 08/28/22   Jacqueline Matsu, MD  ELIQUIS  5 MG TABS tablet TAKE 1 TABLET BY MOUTH TWICE A DAY 11/28/22   Debbie Fails, PA-C  fluticasone (FLONASE) 50 MCG/ACT nasal spray Place 1 spray into both nostrils in the morning and at bedtime.    [provider]  levothyroxine  (SYNTHROID ) 88 MCG tablet Take 88 mcg by mouth daily. 11/14/21   [provider]  Multiple Vitamins-Minerals (MULTIVITAMIN WITH MINERALS) tablet Take 1 tablet by mouth daily.    [provider]  nortriptyline  (PAMELOR ) 10 MG capsule TAKE TWO CAPSULES BY MOUTH AT BEDTIME 03/08/23   Kirsteins, Cecilia Coe, MD  polyethylene glycol (MIRALAX / GLYCOLAX) packet Take 17 g by mouth daily.    [provider]  pregabalin  (LYRICA ) 200 MG capsule Take 1 capsule (200 mg total) by mouth 3 (three) times daily. 03/08/23   Kirsteins, Cecilia Coe, MD  Probiotic Product (PROBIOTIC  PO) Take 1 tablet by mouth at bedtime.    [provider]  propranolol  (INDERAL ) 10 MG tablet Take 1 tablet (10 mg total) by mouth daily as needed (palpitations). 11/14/21   Ursuy, Renee Lynn, PA-C  senna (SENOKOT) 8.6 MG tablet Take 2 tablets by mouth 2 (two) times daily. Bedtime    [provider]  traMADol  (ULTRAM ) 50 MG tablet Take two tablets 4 times a day as needed for pain 04/02/23   Ford Ide L, NP  triamcinolone  cream (KENALOG) 0.1 % Apply topically. 09/09/21   [provider]    Allergies: Levofloxacin    Review of Systems  Gastrointestinal:  Positive for constipation.    Updated Vital Signs BP 131/62 (BP Location: Left Arm)   Pulse 60   Temp 97.6 F (36.4 C) (Oral)   Resp 14   Ht 6' 1 (1.854 m)   Wt 93 kg   SpO2 98%   BMI 27.05 kg/m   Physical Exam Vitals and nursing note reviewed.  Constitutional:      Appearance: He is well-developed.  HENT:     Head: Normocephalic and atraumatic.   Eyes:     Pupils: Pupils are equal, round, and reactive to light.   Neck:     Vascular: No JVD.   Cardiovascular:     Rate and Rhythm: Normal rate and regular rhythm.     Heart sounds: No murmur  heard.    No friction rub. No gallop.  Pulmonary:     Effort: No respiratory distress.     Breath sounds: No wheezing.  Abdominal:     General: There is no distension.     Tenderness: There is no abdominal tenderness. There is no guarding or rebound.  Genitourinary:    Comments: Hard stool just at the tip of my finger.  Musculoskeletal:        General: Normal range of motion.     Cervical back: Normal range of motion and neck supple.   Skin:    Coloration: Skin is not pale.     Findings: No rash.   Neurological:     Mental Status: He is alert and oriented to person, place, and time.   Psychiatric:        Behavior: Behavior normal.     (all labs ordered are listed, but only abnormal results are displayed) Labs Reviewed - No data to  display  EKG: None  Radiology: No results found.   Procedures   Medications Ordered in the ED  sodium phosphate  (FLEET) enema 1 enema (1 enema Rectal Given 08/11/23 1803)                                    Medical Decision Making Risk OTC drugs.   68 yo M with a chief complaints of difficulty moving his bowels.  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  He feels he has been worse the past couple days to where he has not been able to really move his bowels at all.  He also has had difficulty urinating.  His bladder volume was about 600 cc on arrival here.  Unable to urinate.  Will attempt to have him move his bowels and see if that improves his symptoms.  If not we will place Foley.  Patient able to have BM here.  Albe to urinate.  D/c home.  PCP follow up.  10:10 PM:  I have discussed the diagnosis/risks/treatment options with the patient.  Evaluation and diagnostic testing in the emergency department does not suggest an emergent condition requiring admission or immediate intervention beyond what has been performed at this time.  They will follow up with PCP. We also discussed returning to the ED immediately if new or worsening sx occur. We discussed the sx which are most concerning (e.g., sudden worsening pain, fever, inability to tolerate by mouth) that necessitate immediate return. Medications administered to the patient during their visit and any new prescriptions provided to the patient are listed below.  Medications given during this visit Medications  sodium phosphate  (FLEET) enema 1 enema (1 enema Rectal Given 08/11/23 1803)     The patient appears reasonably screen and/or stabilized for discharge and I doubt any other medical condition or other [facility] requiring further screening, evaluation, or treatment in the ED at this time prior to discharge.       Final diagnoses:  Fecal impaction in rectum [facility])    ED Discharge Orders     None           Albertus Hughs, DO 08/11/23 2210

## 2023-08-11 NOTE — Discharge Instructions (Signed)
 Take 8 scoops of miralax in 32oz of whatever you would like to drink.(Gatorade comes in this size) You can also use a fleets enema which you can buy over the counter at the pharmacy.  Return for worsening abdominal pain, vomiting or fever.  Follow up with your doctor in the office.

## 2023-08-11 NOTE — ED Notes (Signed)
 Patient attempting to have a bowel movement now. Call light within reach. Patient instructed to call once complete.

## 2023-08-11 NOTE — ED Notes (Signed)
 Patient attempting to have a bowel movement now. Call light within reach. Patient instructed to call once complete. RN and tech have checked on pt since he has been on bedside commode for approximately 20 minutes.

## 2023-08-11 NOTE — ED Triage Notes (Signed)
 C/o urinary retention and constipation. States last urination was this morning, last BM was approximately 2 days ago. Hx of constipation issues on chronic pain meds. Feels rectal pressure.

## 2023-08-13 ENCOUNTER — Other Ambulatory Visit: Payer: Self-pay | Admitting: Cardiology

## 2023-08-13 DIAGNOSIS — Q631 Lobulated, fused and horseshoe kidney: Secondary | ICD-10-CM | POA: Diagnosis not present

## 2023-08-13 DIAGNOSIS — Z87442 Personal history of urinary calculi: Secondary | ICD-10-CM | POA: Diagnosis not present

## 2023-08-13 DIAGNOSIS — Q6102 Congenital multiple renal cysts: Secondary | ICD-10-CM | POA: Diagnosis not present

## 2023-08-13 DIAGNOSIS — N35911 Unspecified urethral stricture, male, meatal: Secondary | ICD-10-CM | POA: Diagnosis not present

## 2023-08-22 DIAGNOSIS — E782 Mixed hyperlipidemia: Secondary | ICD-10-CM | POA: Diagnosis not present

## 2023-08-22 DIAGNOSIS — E039 Hypothyroidism, unspecified: Secondary | ICD-10-CM | POA: Diagnosis not present

## 2023-08-22 DIAGNOSIS — I480 Paroxysmal atrial fibrillation: Secondary | ICD-10-CM | POA: Diagnosis not present

## 2023-08-22 DIAGNOSIS — K5901 Slow transit constipation: Secondary | ICD-10-CM | POA: Diagnosis not present

## 2023-09-05 ENCOUNTER — Encounter: Payer: Self-pay | Admitting: Registered Nurse

## 2023-09-05 ENCOUNTER — Encounter: Payer: Medicare Other | Attending: Registered Nurse | Admitting: Registered Nurse

## 2023-09-05 VITALS — BP 113/61 | HR 71 | Ht 73.0 in | Wt 207.0 lb

## 2023-09-05 DIAGNOSIS — Z5181 Encounter for therapeutic drug level monitoring: Secondary | ICD-10-CM | POA: Insufficient documentation

## 2023-09-05 DIAGNOSIS — G894 Chronic pain syndrome: Secondary | ICD-10-CM | POA: Diagnosis not present

## 2023-09-05 DIAGNOSIS — G890 Central pain syndrome: Secondary | ICD-10-CM | POA: Diagnosis not present

## 2023-09-05 DIAGNOSIS — Z79891 Long term (current) use of opiate analgesic: Secondary | ICD-10-CM | POA: Diagnosis not present

## 2023-09-05 MED ORDER — TRAMADOL HCL 50 MG PO TABS: ORAL_TABLET | ORAL | 1 refills | Status: DC

## 2023-09-05 MED ORDER — PREGABALIN 200 MG PO CAPS: 200.0000 mg | ORAL_CAPSULE | Freq: Three times a day (TID) | ORAL | 1 refills | Status: DC

## 2023-09-05 NOTE — Progress Notes (Signed)
 Subjective:    Patient ID: [name], male    DOB: [birth year], 68 y.o.   MRN: [id number]  HPI: [name] is a 68 y.o. male who returns for follow up appointment for chronic pain and medication refill. He states his pain is located in his right lower extremity and right foot. He rates his pain 7. His current exercise regime is walking and performing stretching exercises.  [name]  equivalent is 80.00 MME.   UDS ordered today.      Pain Inventory Average Pain 7 Pain Right Now 7 My pain is constant, burning, dull, tingling, and aching  In the last 24 hours, has pain interfered with the following? General activity 4 Relation with others 0 Enjoyment of life 2 What TIME of day is your pain at its worst? morning , daytime, evening, and night Sleep (in general) Fair  Pain is worse with: some activites Pain improves with: rest and medication Relief from Meds: 3  Family History  Problem Relation Age of Onset   Cancer Mother    Heart failure Father    Stroke Brother    Social History   Socioeconomic History   Marital status: Married    Spouse name: [name]   Number of children: 2   Years of education: Not on file   Highest education level: Not on file  Occupational History   Not on file  Tobacco Use   Smoking status: Never   Smokeless tobacco: Never  Vaping Use   Vaping status: Never Used  Substance and Sexual Activity   Alcohol use: Yes    Alcohol/week: 1.0 standard drink of alcohol    Types: 1 Cans of beer per week    Comment: occasionally maybe the weekends   Drug use: No   Sexual activity: Not on file  Other Topics Concern   Not on file  Social History Narrative   08/02/21 lives with wife   Social Drivers of Corporate investment banker Strain: Not on file  Food Insecurity: Low Risk  (06/04/2023)   Received from [facility]   Hunger Vital Sign    Within the past 12 months, you worried that your food would run out before you got money  to buy more: Never true    Within the past 12 months, the food you bought just didn't last and you didn't have money to get more. : Never true  Transportation Needs: No Transportation Needs (06/04/2023)   Received from [facility]    In the past 12 months, has lack of reliable transportation kept you from medical appointments, meetings, work or from getting things needed for daily living? : No  Physical Activity: Not on file  Stress: Not on file  Social Connections: Unknown (07/12/2021)   Received from [facility]   Social Network    Social Network: Not on file   Past Surgical History:  Procedure Laterality Date   ATRIAL FIBRILLATION ABLATION N/A 03/27/2017   Procedure: ATRIAL FIBRILLATION ABLATION;  Surgeon: Kelsie Agent, MD;  Location: [facility];  Service: Cardiovascular;  Laterality: N/A;   LITHOTRIPSY     VASECTOMY     Past Surgical History:  Procedure Laterality Date   ATRIAL FIBRILLATION ABLATION N/A 03/27/2017   Procedure: ATRIAL FIBRILLATION ABLATION;  Surgeon: Kelsie Agent, MD;  Location: [facility];  Service: Cardiovascular;  Laterality: N/A;   LITHOTRIPSY     VASECTOMY     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   38 mm by echo 12/2020   Basal cell carcinoma    Bradycardia 10/21/2015   Contusion of calf, left, initial encounter 12/03/2015   CVA (cerebral vascular accident) (HCC) 2016   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   Left post branch MCA infarct hasRLE>> RUE symptoms    Dilated aortic root (HCC)    40 mm by echo 12/2020   Hyperlipidemia LDL goal <70 05/31/2016   ICAO (internal carotid artery occlusion), left 07/10/2016   chronically occluded left ICA and s/p right CEA at [facility]   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   Nephrolithiasis    Neuropathy    PAF (paroxysmal atrial  fibrillation) (HCC) 07/10/2016   s/p afib ablation 02/2017   Typical atrial flutter (HCC)    BP 113/61   Pulse 71   Ht 6' 1 (1.854 m)   Wt 207 lb (93.9 kg)   SpO2 94%   BMI 27.31 kg/m   Opioid Risk Score:   Fall Risk Score:  `1  Depression screen PHQ 2/9     09/05/2023    2:18 PM 03/08/2023    2:06 PM 03/09/2022    1:54 PM 01/05/2022    1:51 PM 09/15/2021    2:22 PM 05/03/2021    2:30 PM 03/30/2021   11:11 AM  Depression screen PHQ 2/9  Decreased Interest 0 0 0 0 0 0 0  Down, Depressed, Hopeless 0 0 0  0 0 0  PHQ - 2 Score 0 0 0 0 0 0 0    Review of Systems  Musculoskeletal:        Right leg pain  All other systems reviewed and are negative.      Objective:   Physical Exam Vitals and nursing note reviewed.  Constitutional:      Appearance: Normal appearance.  Cardiovascular:     Rate and Rhythm: Normal rate and regular rhythm.     Pulses: Normal pulses.     Heart sounds: Normal heart sounds.  Pulmonary:     Effort: Pulmonary effort is normal.     Breath sounds: Normal breath sounds.  Musculoskeletal:     Comments: Normal Muscle Bulk and Muscle Testing Reveals:  Upper Extremities: Full  ROM and Muscle Strength 5/5 Lower Extremities: Full ROM and Muscle Strength 5/5 Arises from Table slowly Narrow Based  Gait     Skin:    General: Skin is warm and dry.  Neurological:     Mental Status: He is alert and oriented to person, place, and time.  Psychiatric:        Mood and Affect: Mood normal.        Behavior: Behavior normal.          Assessment & Plan:  1. 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 #720 90 day supply and  Lyrica  200 mg one capsule three times a day #270. 09/05/2023 2. Chronic Pain: Continue Tramadol  and Lyrica . Continue to Monitor.  We will continue the opioid monitoring program, this consists of regular clinic visits, examinations, urine drug screen, pill  counts as well as use of [place]  Controlled Substance Reporting system. A 12 month History has been reviewed on the [place]  Controlled Substance Reporting System on 09/05/2023   F/U in 6 months

## 2023-09-08 LAB — TOXASSURE SELECT,+ANTIDEPR,UR

## 2023-10-17 DIAGNOSIS — M1712 Unilateral primary osteoarthritis, left knee: Secondary | ICD-10-CM | POA: Diagnosis not present

## 2023-10-29 DIAGNOSIS — J019 Acute sinusitis, unspecified: Secondary | ICD-10-CM | POA: Diagnosis not present

## 2023-11-01 ENCOUNTER — Encounter: Payer: Self-pay | Admitting: Cardiology

## 2023-11-01 ENCOUNTER — Ambulatory Visit: Attending: Cardiology | Admitting: Cardiology

## 2023-11-01 VITALS — BP 102/72 | HR 72 | Ht 74.0 in | Wt 199.8 lb

## 2023-11-01 DIAGNOSIS — I6523 Occlusion and stenosis of bilateral carotid arteries: Secondary | ICD-10-CM | POA: Diagnosis not present

## 2023-11-01 DIAGNOSIS — Z79899 Other long term (current) drug therapy: Secondary | ICD-10-CM

## 2023-11-01 DIAGNOSIS — I77810 Thoracic aortic ectasia: Secondary | ICD-10-CM | POA: Diagnosis not present

## 2023-11-01 DIAGNOSIS — I480 Paroxysmal atrial fibrillation: Secondary | ICD-10-CM | POA: Diagnosis not present

## 2023-11-01 DIAGNOSIS — E785 Hyperlipidemia, unspecified: Secondary | ICD-10-CM

## 2023-11-01 LAB — CBC WITH DIFFERENTIAL/PLATELET
Basophils Absolute: 0 x10E3/uL (ref 0.0–0.2)
Basos: 0 %
EOS (ABSOLUTE): 0.1 x10E3/uL (ref 0.0–0.4)
Eos: 2 %
Hematocrit: 39.3 % (ref 37.5–51.0)
Hemoglobin: 13 g/dL (ref 13.0–17.7)
Immature Grans (Abs): 0 x10E3/uL (ref 0.0–0.1)
Immature Granulocytes: 0 %
Lymphocytes Absolute: 2 x10E3/uL (ref 0.7–3.1)
Lymphs: 42 %
MCH: 31 pg (ref 26.6–33.0)
MCHC: 33.1 g/dL (ref 31.5–35.7)
MCV: 94 fL (ref 79–97)
Monocytes Absolute: 0.4 x10E3/uL (ref 0.1–0.9)
Monocytes: 9 %
Neutrophils Absolute: 2.2 x10E3/uL (ref 1.4–7.0)
Neutrophils: 46 %
Platelets: 201 x10E3/uL (ref 150–450)
RBC: 4.19 x10E6/uL (ref 4.14–5.80)
RDW: 12.4 % (ref 11.6–15.4)
WBC: 4.7 x10E3/uL (ref 3.4–10.8)

## 2023-11-01 LAB — BASIC METABOLIC PANEL WITH GFR
BUN/Creatinine Ratio: 12 (ref 10–24)
BUN: 12 mg/dL (ref 8–27)
CO2: 25 mmol/L (ref 20–29)
Calcium: 9 mg/dL (ref 8.6–10.2)
Chloride: 104 mmol/L (ref 96–106)
Creatinine, Ser: 0.97 mg/dL (ref 0.76–1.27)
Glucose: 95 mg/dL (ref 70–99)
Potassium: 4.7 mmol/L (ref 3.5–5.2)
Sodium: 142 mmol/L (ref 134–144)
eGFR: 84 mL/min/1.73 (ref >59)

## 2023-11-01 NOTE — Addendum Note (Signed)
 Addended by: JANIT GENI CROME on: 11/01/2023 04:15 PM   Modules accepted: Orders

## 2023-11-01 NOTE — Progress Notes (Signed)
 Date:  11/01/2023   ID:  SUSANO CLECKLER, DOB 12/17/1955, MRN [id number]  PCP:  [facility] [place]  Cardiologist:  Wilbert Bihari, MD  Electrophysiologist:  Lynwood Rakers, MD (Inactive)   Chief Complaint:  Atrial fibrillation and dilated aortic roo  History of Present Illness:    [name] is a 68 y.o. male  with a history of PAF s/p afib ablation 02/2017 with CHADS2VASC score 4 (CVA, age>65, vascular dz) on Apixaban .  He also has a history of dilated aortic root by echo which also showed normal LVF.  He also had carotid artery stenosis with 60-70% right ICA stenosis and occluded Left ICA with prior CVA  S/p Right CEA at [facility] and hyperlipidemia.    He is here today for follow-up of his dilated aorta and is doing well.  He denies any chest pain or pressure, shortness of breath, PND, orthopnea, lower extremity edema, dizziness, palpitations or syncope.  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   Left post branch MCA infarct hasRLE>> RUE symptoms    Dilated aortic root (HCC)    40 mm by echo 12/2020   Hyperlipidemia LDL goal <70 05/31/2016   ICAO (internal carotid artery occlusion), left 07/10/2016   chronically occluded left ICA and s/p right CEA at [facility]   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   atorvastatin  (LIPITOR) 40 MG tablet TAKE 1 TABLET DAILY AT 6 P.M.   ELIQUIS  5 MG TABS tablet TAKE 1 TABLET BY MOUTH TWICE A DAY   fluticasone (FLONASE) 50 MCG/ACT nasal spray Place 1 spray into both nostrils in the morning and at bedtime.   levothyroxine  (SYNTHROID ) 88 MCG tablet Take 88 mcg by mouth daily.   Multiple Vitamins-Minerals (MULTIVITAMIN WITH MINERALS) tablet Take 1 tablet by mouth daily.   nortriptyline  (PAMELOR ) 10 MG capsule TAKE TWO CAPSULES BY MOUTH AT BEDTIME   polyethylene glycol (MIRALAX / GLYCOLAX) packet Take 17 g by mouth daily.   pregabalin  (LYRICA ) 200 MG capsule Take 1 capsule (200 mg total) by mouth 3 (three) times daily.   Probiotic Product (PROBIOTIC PO) Take 1 tablet by mouth at bedtime.   propranolol  (INDERAL ) 10 MG tablet Take 1 tablet (10 mg total) by mouth daily as needed (palpitations).   senna (SENOKOT) 8.6 MG tablet Take 2 tablets by mouth 2 (two) times daily. Bedtime   traMADol  (ULTRAM ) 50 MG tablet Take two tablets  4 times a day as needed for pain   triamcinolone  cream (KENALOG) 0.1 % Apply topically.     Allergies:   Levofloxacin   Social History   Tobacco Use   Smoking status: Never   Smokeless tobacco: Never  Vaping Use   Vaping status: Never Used  Substance Use Topics   Alcohol use: Yes    Alcohol/week: 1.0 standard drink of alcohol    Types: 1 Cans of beer per week    Comment: occasionally maybe the weekends   Drug use: No     Family Hx: The patient's family history includes Cancer in his mother; Heart failure in his father; Stroke in his brother.  ROS:   Please see the history of present illness.      All other systems reviewed and are negative.   Labs/Other Tests and Data Reviewed:    Recent Labs: No results found for requested labs within last 365 days.   Recent Lipid Panel Lab Results  Component Value Date/Time   CHOL 108 06/22/2016 09:52 AM   TRIG 75 06/22/2016 09:52 AM   HDL 29 (L) 06/22/2016 09:52 AM   CHOLHDL 3.7 06/22/2016 09:52 AM   CHOLHDL 5.0 09/25/2014 04:00 AM   LDLCALC 64 06/22/2016 09:52 AM    Wt Readings from Last 3 Encounters:  11/01/23 199 lb 12.8 oz (90.6 kg)  09/05/23 207 lb (93.9 kg)  08/11/23 205 lb (93 kg)     Objective:    Vital Signs:  BP 102/72   Pulse 72   Ht 6' 2 (1.88 m)   Wt 199 lb 12.8 oz (90.6 kg)   SpO2 95%   BMI 25.65 kg/m    GEN: Well nourished, well developed in no acute distress HEENT: Normal NECK: No JVD; No carotid bruits LYMPHATICS: No lymphadenopathy CARDIAC:RRR, no murmurs, rubs, gallops RESPIRATORY:  Clear to auscultation without rales, wheezing or rhonchi  ABDOMEN: Soft, non-tender, non-distended MUSCULOSKELETAL:  No edema; No deformity  SKIN: Warm and dry NEUROLOGIC:  Alert and oriented x 3 PSYCHIATRIC:  Normal affect  ASSESSMENT & PLAN:    1.  PAF -s/p afib ablation 02/2017 -He is followed in atrial fibrillation clinic. -Maintaining normal sinus rhythm on exam today and denies any palpitations -he has had some blood in his stool in the past and did mention it to his PCP and felt it was hemorrhoids  -Continue apixaban  5 mg twice daily and propranolol  10 mg daily as needed for breakthrough palpitations -Check BMP and CBC  2.  Carotid artery stenosis -chronically occluded left ICA stenosis and 60-79% right carotid stenosis.   -He is s/p right CEA at [facility] -this is followed by Neurosurgery at [facility] -continue ASA and statin  3.  HLD -LDL goal < 70 -I have personally reviewed and interpreted outside labs performed by patient's PCP which showed LDL 47, HDL 36, ALT 18 on 3/74/7974 -Continue atorvastatin  40  mg daily  4.  Dilated aortic root -Chest CT 2020 showed 3.6cm ascending aorta and 3.8cm at [facility] -2D echo 01/04/2021 showed an aortic dimension at the level of the ascending aorta 3.8 cm but when indexed for body surface area it is within normal range -continue with good BP control -continue statin -Repeat 2D echo to reassess     Medication Adjustments/Labs and Tests Ordered: Current medicines are reviewed at length with the patient today.  Concerns regarding medicines are outlined above.  Tests Ordered: No orders of the defined types were placed in this encounter.  Medication Changes:  No orders of the defined types were placed in this encounter.   Disposition:  Follow up in 1 year(s) with Orren Fabry, PA  Signed, Wilbert Bihari, MD  11/01/2023 4:01 PM    [facility]

## 2023-11-01 NOTE — Patient Instructions (Signed)
 Medication Instructions:  Your physician recommends that you continue on your current medications as directed. Please refer to the Current Medication list given to you today.  *If you need a refill on your cardiac medications before your next appointment, please call your pharmacy*  Lab Work: Please complete a BMET and a CBC today in our first floor lab before you leave.  If you have labs (blood work) drawn today and your tests are completely normal, you will receive your results only by: MyChart Message (if you have MyChart) OR A paper copy in the mail If you have any lab test that is abnormal or we need to change your treatment, we will call you to review the results.  Testing/Procedures: Your physician has requested that you have an echocardiogram. Echocardiography is a painless test that uses sound waves to create images of your heart. It provides your doctor with information about the size and shape of your heart and how well your heart's chambers and valves are working. This procedure takes approximately one hour. There are no restrictions for this procedure. Please do NOT wear cologne, perfume, aftershave, or lotions (deodorant is allowed). Please arrive 15 minutes prior to your appointment time.  Please note: We ask at that you not bring children with you during ultrasound (echo/ vascular) testing. Due to room size and safety concerns, children are not allowed in the ultrasound rooms during exams. Our front office staff cannot provide observation of children in our lobby area while testing is being conducted. An adult accompanying a patient to their appointment will only be allowed in the ultrasound room at the discretion of the ultrasound technician under special circumstances. We apologize for any inconvenience.   Follow-Up: At [facility], you and your health needs are our priority.  As part of our continuing mission to provide you with exceptional heart care, our providers  are all part of one team.  This team includes your primary Cardiologist (physician) and Advanced Practice Providers or APPs (Physician Assistants and Nurse Practitioners) who all work together to provide you with the care you need, when you need it.  Your next appointment:   1 year(s)  Provider:   Orren Fabry, PA-C

## 2023-11-02 ENCOUNTER — Ambulatory Visit: Payer: Self-pay | Admitting: Cardiology

## 2023-11-02 DIAGNOSIS — I77810 Thoracic aortic ectasia: Secondary | ICD-10-CM

## 2023-11-06 DIAGNOSIS — H7292 Unspecified perforation of tympanic membrane, left ear: Secondary | ICD-10-CM | POA: Diagnosis not present

## 2023-11-06 DIAGNOSIS — H6123 Impacted cerumen, bilateral: Secondary | ICD-10-CM | POA: Diagnosis not present

## 2023-11-07 NOTE — Telephone Encounter (Signed)
-----   Message from Wilbert Bihari sent at 11/02/2023  8:27 AM EDT ----- Please let patient know that labs were normal.  Continue current medical therapy. ----- Message ----- From: Interface, [facility] Lab Results In Sent: 11/01/2023  10:36 PM EDT To: Wilbert JONELLE Bihari, MD

## 2023-11-07 NOTE — Telephone Encounter (Signed)
 Called patient know that labs were normal and to continue current medical therapy. Spoke to spouse [name] [facility]) who verbalizes understanding.

## 2023-11-14 ENCOUNTER — Other Ambulatory Visit: Payer: Self-pay | Admitting: Cardiology

## 2023-11-14 MED ORDER — ATORVASTATIN CALCIUM 40 MG PO TABS: ORAL_TABLET | ORAL | 3 refills | Status: AC

## 2023-11-21 DIAGNOSIS — K080 Exfoliation of teeth due to systemic causes: Secondary | ICD-10-CM | POA: Diagnosis not present

## 2023-12-06 ENCOUNTER — Other Ambulatory Visit (HOSPITAL_COMMUNITY)

## 2023-12-11 ENCOUNTER — Telehealth: Payer: Self-pay | Admitting: Physical Medicine & Rehabilitation

## 2023-12-11 ENCOUNTER — Encounter: Payer: Self-pay | Admitting: Physical Medicine & Rehabilitation

## 2023-12-11 NOTE — Telephone Encounter (Signed)
 Patient's wife is calling wanting to schedule a right hip injection with you.  I told her the last time that he had one was back in 2023.  Do you want to schedule injection with him?

## 2023-12-13 ENCOUNTER — Encounter: Attending: Physical Medicine & Rehabilitation | Admitting: Physical Medicine & Rehabilitation

## 2023-12-13 ENCOUNTER — Encounter: Payer: Self-pay | Admitting: Physical Medicine & Rehabilitation

## 2023-12-13 VITALS — BP 124/78 | HR 72 | Ht 74.0 in | Wt 200.0 lb

## 2023-12-13 DIAGNOSIS — M7061 Trochanteric bursitis, right hip: Secondary | ICD-10-CM | POA: Insufficient documentation

## 2023-12-13 MED ORDER — BETAMETHASONE SOD PHOS & ACET 6 (3-3) MG/ML IJ SUSP
6.0000 mg | Freq: Once | INTRAMUSCULAR | Status: AC
  Administered 2023-12-13: 6 mg

## 2023-12-13 MED ORDER — LIDOCAINE HCL 1 % IJ SOLN
5.0000 mL | Freq: Once | INTRAMUSCULAR | Status: AC
  Administered 2023-12-13: 5 mL

## 2023-12-13 NOTE — Progress Notes (Signed)
 RIght hip Trochanteric bursa injection without ultrasound guidance  Indication Trochanteric bursitis. Exam has tenderness over the greater trochanter of the hip. Pain has not responded to conservative care such as exercise therapy and oral medications. Pain interferes with sleep or with mobility Informed consent was obtained after describing risks and benefits of the procedure with the patient these include bleeding bruising and infection. Patient has signed written consent form. Patient placed in a lateral decubitus position with the affected hip superior. Point of maximal pain was palpated marked and prepped with Betadine and entered with a needle to bone contact. Needle slightly withdrawn then 6mg  of betamethasone  with 4 cc 1% lidocaine  were injected. Patient tolerated procedure well. Post procedure instructions given.

## 2023-12-15 ENCOUNTER — Other Ambulatory Visit: Payer: Self-pay | Admitting: Physician Assistant

## 2023-12-17 NOTE — Telephone Encounter (Signed)
 Prescription refill request for Eliquis  received. Indication:afib Last office visit:9/25 Scr:0.97  9/25 Age: 68 Weight:90.7  kg  Prescription refilled

## 2023-12-26 ENCOUNTER — Encounter: Payer: Self-pay | Admitting: Cardiology

## 2023-12-26 ENCOUNTER — Ambulatory Visit (HOSPITAL_COMMUNITY)
Admission: RE | Admit: 2023-12-26 | Discharge: 2023-12-26 | Disposition: A | Discharge status: Home / Self Care | Source: Ambulatory Visit | Attending: Cardiology | Admitting: Cardiology

## 2023-12-26 DIAGNOSIS — I77810 Thoracic aortic ectasia: Secondary | ICD-10-CM | POA: Diagnosis present

## 2023-12-26 DIAGNOSIS — I4892 Unspecified atrial flutter: Secondary | ICD-10-CM | POA: Diagnosis not present

## 2023-12-26 DIAGNOSIS — I4891 Unspecified atrial fibrillation: Secondary | ICD-10-CM | POA: Diagnosis not present

## 2023-12-26 LAB — ECHOCARDIOGRAM COMPLETE
Area-P 1/2: 3.47 cm2
S' Lateral: 2.6 cm

## 2023-12-27 ENCOUNTER — Other Ambulatory Visit: Payer: Self-pay

## 2023-12-27 ENCOUNTER — Encounter: Admitting: Physical Medicine & Rehabilitation

## 2023-12-27 DIAGNOSIS — I77810 Thoracic aortic ectasia: Secondary | ICD-10-CM

## 2024-01-10 DIAGNOSIS — Z85828 Personal history of other malignant neoplasm of skin: Secondary | ICD-10-CM | POA: Diagnosis not present

## 2024-01-10 DIAGNOSIS — L570 Actinic keratosis: Secondary | ICD-10-CM | POA: Diagnosis not present

## 2024-01-10 DIAGNOSIS — D225 Melanocytic nevi of trunk: Secondary | ICD-10-CM | POA: Diagnosis not present

## 2024-01-10 DIAGNOSIS — D485 Neoplasm of uncertain behavior of skin: Secondary | ICD-10-CM | POA: Diagnosis not present

## 2024-01-10 DIAGNOSIS — L821 Other seborrheic keratosis: Secondary | ICD-10-CM | POA: Diagnosis not present

## 2024-01-10 DIAGNOSIS — L905 Scar conditions and fibrosis of skin: Secondary | ICD-10-CM | POA: Diagnosis not present

## 2024-01-15 ENCOUNTER — Encounter: Admitting: Physical Medicine & Rehabilitation

## 2024-01-17 NOTE — Telephone Encounter (Signed)
-----   Message from Wilbert Bihari sent at 12/26/2023  9:14 PM EDT ----- Echo showed normal heart function with EF 60-65%, trivial leakiness of MV and mildly dilated aortic arch at 40mm.  Repeat echo in 1 year for dilated aorta ----- Message ----- From: Interface, [facility] Sent: 12/26/2023   4:55 PM EDT To: Wilbert JONELLE Bihari, MD

## 2024-01-17 NOTE — Telephone Encounter (Signed)
 Call to patient to discuss echo results, no answer. Left detailed message per DPR explaining that Echo showed normal heart function with EF 60-65%, trivial leakiness of MV and mildly dilated aortic arch at 40mm. Advised that Dr. Shlomo recommends repeat echo in 1 year for dilated aorta. Order placed. Asked patient to call our office if any questions.

## 2024-03-06 ENCOUNTER — Encounter: Payer: Self-pay | Attending: Physical Medicine & Rehabilitation | Admitting: Physical Medicine & Rehabilitation

## 2024-03-06 ENCOUNTER — Encounter: Payer: Self-pay | Admitting: Physical Medicine & Rehabilitation

## 2024-03-06 VITALS — BP 123/73 | HR 68 | Ht 74.0 in | Wt 200.0 lb

## 2024-03-06 DIAGNOSIS — G890 Central pain syndrome: Secondary | ICD-10-CM | POA: Insufficient documentation

## 2024-03-06 MED ORDER — NORTRIPTYLINE HCL 10 MG PO CAPS: ORAL_CAPSULE | ORAL | 1 refills | Status: AC

## 2024-03-06 MED ORDER — PREGABALIN 200 MG PO CAPS: 200.0000 mg | ORAL_CAPSULE | Freq: Three times a day (TID) | ORAL | 1 refills | Status: AC

## 2024-03-06 MED ORDER — TRAMADOL HCL 50 MG PO TABS: ORAL_TABLET | ORAL | 1 refills | Status: AC

## 2024-03-06 NOTE — Progress Notes (Signed)
 "  Subjective:    Patient ID: [name], male    DOB: [birth year], 69 y.o.   MRN: [id number] Discussed the use of AI scribe software for clinical note transcription with the patient, who gave verbal consent to proceed.  History of Present Illness [name] is a 69 year old male with sequelae of cerebral infarction affecting the right dominant side and chronic knee osteoarthritis who presents for follow-up of pain and functional status.  He reports persistent, constant burning pain in the posterior thigh for the past several years, which he attributes to prior cerebral infarction. No new or different pain symptoms are present.  He continues to experience chronic right knee pain managed by an orthopedic specialist with periodic intra-articular injections. He reports that his orthopedic specialist recommended gait therapy, which may help postpone knee surgery. He is awaiting initiation of physical therapy at [facility] ([facility] with scheduling anticipated in approximately one month.  He remains independent with dressing and bathing, without difficulty performing these activities. He continues nortriptyline , which provides pain and sleep relief, with mild tiredness as the only reported side effect. Sleep quality is described as good.  History of aphasia, Wernicke's type  Pain Inventory Average Pain 7 Pain Right Now 7 My pain is constant, burning, dull, and aching  In the last 24 hours, has pain interfered with the following? General activity 4 Relation with others 0 Enjoyment of life 4 What TIME of day is your pain at its worst? varies Sleep (in general) Fair  Pain is worse with: . Pain improves with: medication Relief from Meds: 3  Family History  Problem Relation Age of Onset   Cancer Mother    Heart failure Father    Stroke Brother    Social History   Socioeconomic History   Marital status: Married    Spouse name: [name]   Number of children: 2   Years of education:  Not on file   Highest education level: Not on file  Occupational History   Not on file  Tobacco Use   Smoking status: Never   Smokeless tobacco: Never  Vaping Use   Vaping status: Never Used  Substance and Sexual Activity   Alcohol use: Yes    Alcohol/week: 1.0 standard drink of alcohol    Types: 1 Cans of beer per week    Comment: occasionally maybe the weekends   Drug use: No   Sexual activity: Not on file  Other Topics Concern   Not on file  Social History Narrative   08/02/21 lives with wife   Social Drivers of Health   Tobacco Use: Low Risk (02/12/2024)   Received from [facility]   Patient History    Smoking Tobacco Use: Never    Smokeless Tobacco Use: Never    Passive Exposure: Not on file  Financial Resource Strain: Not on file  Food Insecurity: Low Risk (02/12/2024)   Received from [facility]   Epic    Within the past 12 months, you worried that your food would run out before you got money to buy more: Never true    Within the past 12 months, the food you bought just didn't last and you didn't have money to get more. : Never true  Transportation Needs: No Transportation Needs (02/12/2024)   Received from [facility]    In the past 12 months, has lack of reliable transportation kept you from medical appointments, meetings, work or from getting things needed for daily  living? : No  Physical Activity: Not on file  Stress: Not on file  Social Connections: Not on file  Depression (PHQ2-9): Low Risk (09/05/2023)   Depression (PHQ2-9)    PHQ-2 Score: 0  Alcohol Screen: Not on file  Housing: Low Risk (02/12/2024)   Received from [facility]   Epic    What is your living situation today?: I have a steady place to live    Think about the place you live. Do you have problems with any of the following? Choose all that apply:: None/None on this list  Utilities: Low Risk (02/12/2024)   Received from [facility]   Utilities    In the past 12  months has the electric, gas, oil, or water company threatened to shut off services in your home? : No  Health Literacy: Not on file   Past Surgical History:  Procedure Laterality Date   ATRIAL FIBRILLATION ABLATION N/A 03/27/2017   Procedure: ATRIAL FIBRILLATION ABLATION;  Surgeon: Kelsie Agent, MD;  Location: [facility];  Service: Cardiovascular;  Laterality: N/A;   LITHOTRIPSY     VASECTOMY     Past Surgical History:  Procedure Laterality Date   ATRIAL FIBRILLATION ABLATION N/A 03/27/2017   Procedure: ATRIAL FIBRILLATION ABLATION;  Surgeon: Kelsie Agent, MD;  Location: [facility];  Service: Cardiovascular;  Laterality: N/A;   LITHOTRIPSY     VASECTOMY     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/2015   38 mm by echo 12/2020; 40mm aortic arch by echo 11/2023   Basal cell carcinoma    Bradycardia 10/21/2015   Contusion of calf, left, initial encounter 12/03/2015   CVA (cerebral vascular accident) (HCC) 2016   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   Left post branch MCA infarct hasRLE>> RUE symptoms    Dilated aortic root    40 mm by echo 12/2020   Hyperlipidemia LDL goal <70 05/31/2016   ICAO (internal carotid artery occlusion), left 07/10/2016   chronically occluded left ICA and s/p right CEA at [facility]   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   Nephrolithiasis    Neuropathy    PAF (paroxysmal atrial fibrillation) (HCC) 07/10/2016   s/p afib ablation 02/2017   Typical atrial flutter (HCC)    BP 123/73   Pulse 68   Ht 6' 2 (1.88 m)   Wt 200 lb (90.7 kg)   SpO2 94%   BMI 25.68 kg/m   Opioid Risk Score:   Fall Risk Score:  `1  Depression screen PHQ 2/9     09/05/2023    2:18 PM 03/08/2023    2:06 PM 03/09/2022    1:54 PM 01/05/2022    1:51 PM 09/15/2021    2:22 PM 05/03/2021    2:30 PM 03/30/2021   11:11 AM  Depression screen PHQ  2/9  Decreased Interest 0 0 0 0 0 0 0  Down, Depressed, Hopeless 0 0 0  0 0 0  PHQ - 2 Score 0 0 0 0 0 0 0     Review of Systems     Objective:   Physical Exam  General no acute distress Mood affect appropriate Speech is frequent word substitutions such as arm instead of thigh Comprehension is limited and requires gestural cues for examination instructions. 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 Nortriptyline  effective with mild fatigue. Awaiting physical therapy at specialized facility.  Patient was referred by Ortho - Confirmed use of current pharmacy for medication dispensing. Lyrica  200 mg 3 times daily Tramadol  100 mg 4 times daily Nortriptyline  10 mg nightly No signs of drug interactions such as serotonin syndrome He is still getting good relief with this and no side effects except for some mild fatigue PDMP reviewed, appropriate Toxicology next visit   "

## 2024-03-06 NOTE — Patient Instructions (Signed)
" °  VISIT SUMMARY: You came in for a follow-up on your pain and functional status related to your past stroke and chronic knee osteoarthritis. We discussed your ongoing pain management and upcoming physical therapy.  YOUR PLAN: SEQUELAE OF CEREBRAL INFARCTION: You have chronic neuropathic pain in your posterior thigh due to a past stroke. -Continue taking nortriptyline  as it is effective for your pain and sleep, despite mild tiredness. -You will start physical therapy at [facility] in about a month. -We confirmed your current pharmacy for medication dispensing.                      Contains text generated by Abridge.                                 Contains text generated by Abridge.   "

## 2024-03-18 ENCOUNTER — Ambulatory Visit: Attending: Orthopedic Surgery | Admitting: Physical Therapy

## 2024-03-18 ENCOUNTER — Encounter: Payer: Self-pay | Admitting: Physical Therapy

## 2024-03-18 DIAGNOSIS — M6281 Muscle weakness (generalized): Secondary | ICD-10-CM | POA: Insufficient documentation

## 2024-03-18 DIAGNOSIS — M79604 Pain in right leg: Secondary | ICD-10-CM | POA: Diagnosis present

## 2024-03-18 DIAGNOSIS — R262 Difficulty in walking, not elsewhere classified: Secondary | ICD-10-CM | POA: Insufficient documentation

## 2024-03-18 DIAGNOSIS — M25562 Pain in left knee: Secondary | ICD-10-CM | POA: Diagnosis present

## 2024-03-18 NOTE — Therapy (Signed)
 " OUTPATIENT PHYSICAL THERAPY LOWER EXTREMITY EVALUATION   Patient Name: [name] MRN: [id number] DOB:[birth year], 69 y.o., male Today's Date: 03/18/2024  END OF SESSION:  PT End of Session - 03/18/24 0930     Visit Number 1    Number of Visits 16    Date for Recertification  06/16/24    Authorization Type [employer]    PT Start Time 0930    PT Stop Time 1015    PT Time Calculation (min) 45 min    Activity Tolerance Patient tolerated treatment well    Behavior During Therapy [facility] for tasks assessed/performed          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   Nephrolithiasis    Neuropathy    PAF (paroxysmal atrial fibrillation) (HCC) 07/10/2016   s/p afib ablation 02/2017   Typical atrial flutter (HCC)    Past Surgical History:  Procedure Laterality Date   ATRIAL FIBRILLATION ABLATION N/A 03/27/2017   Procedure: ATRIAL FIBRILLATION ABLATION;  Surgeon: Kelsie Agent, MD;  Location: [facility];  Service: Cardiovascular;  Laterality: N/A;   LITHOTRIPSY     VASECTOMY     Patient Active Problem List   Diagnosis Date Noted   Greater trochanteric bursitis of right hip 05/03/2021   Impacted cerumen of both ears 04/26/2017   Perennial allergic rhinitis 04/26/2017   Paroxysmal atrial  fibrillation (HCC) 03/27/2017   Mild mitral regurgitation 01/25/2017   Carotid disease, bilateral 08/08/2016   PAF (paroxysmal atrial fibrillation) (HCC) 07/10/2016   ICAO (internal carotid artery occlusion), left 07/10/2016   Hyperlipidemia LDL goal <70 05/31/2016   Carotid artery stenosis    Nephrolithiasis 02/08/2016   Abdominal pain, chronic, right lower quadrant 12/03/2015   Contusion of calf, left, initial encounter 12/03/2015   Bradycardia 10/21/2015   Dilated aortic root 10/20/2015   Cerebrovascular accident (CVA) due to embolism of left middle cerebral artery (HCC) 03/12/2015   Altered sensation due to stroke 12/10/2014   Stenosis of right carotid artery 12/10/2014   Occlusion of left internal carotid artery 12/10/2014   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   Embolism of left middle cerebral artery 09/29/2014   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   Acute ischemic left MCA stroke (HCC)    ICAO (internal carotid artery occlusion)    Carotid stenosis    Cerebral infarction (HCC) 09/25/2014   Stroke (HCC) 09/25/2014   Persistent atrial fibrillation (HCC) 09/25/2014   SOB (shortness of breath) 05/19/2014   Heart palpitations 05/19/2014    PCP: Dayna DO  REFERRING PROVIDER: CANDIE Her, MD  REFERRING DIAG: right leg pain, left knee pain  THERAPY DIAG:  Muscle weakness (generalized)  Difficulty in walking, not elsewhere classified  Pain in right leg  Acute pain of left knee  Rationale for Evaluation and Treatment: Rehabilitation  ONSET DATE: December 2025  SUBJECTIVE:   SUBJECTIVE STATEMENT: Patient reports a stroke about 10 years ago, reports that he is very tight and sore in the right lower extremity with some bursitis in the hip, reports a thalmic pain after the stroke, due to this is starting to have some left knee pain due to compensation, x-rays show DJD  PERTINENT HISTORY: Stroke, a-fib, CVA,  PAIN:   Are you having pain? Yes: NPRS scale: pain a 3-4/10 Pain location: right leg, left knee Pain description: sore, ache  Aggravating factors: walking, activity, stairs  pain up to 8/10 Relieving factors: rest, support sleeve, tramadol , lyrica   at best pain a 3/10  PRECAUTIONS: None  RED FLAGS: None   WEIGHT BEARING RESTRICTIONS: No  FALLS:  Has patient fallen in last 6 months? No  LIVING ENVIRONMENT: Lives with: lives with their family Lives in: House/apartment Stairs: Yes: Internal: 16 steps; can reach both Has following equipment at home: None  OCCUPATION: retired  PLOF: Independent and does some housework, some light yard work, does report walking 2-3x/week  PATIENT GOALS: have less pain, walk better  NEXT MD VISIT: about a month  OBJECTIVE:  Note: Objective measures were completed at Evaluation unless otherwise noted.  DIAGNOSTIC FINDINGS: DJD left knee  COGNITION: Overall cognitive status: wife helps with history, some difficulty understanding may have some apraxia and aphasia     SENSATION: Some numbness   MUSCLE LENGTH: Very tight HS and calves, piriformis  POSTURE: rounded shoulders and forward head  PALPATION: Tender in the right GT area and the mms of the right LE  LOWER EXTREMITY ROM:  WFL's has a hard time following commands, needs repeated and time to process   LOWER EXTREMITY MMT:  MMT Right eval Left eval  Hip flexion 4- 4  Hip extension 4- 4  Hip abduction 4- 4  Hip adduction    Hip internal rotation    Hip external rotation    Knee flexion 4 4  Knee extension 4 4  Ankle dorsiflexion 4 4  Ankle plantarflexion    Ankle inversion    Ankle eversion     (Blank rows = not tested)  LOWER EXTREMITY SPECIAL TESTS:  SLS was 5 seconds on each leg  FUNCTIONAL TESTS:  5 times sit to stand: 21 seconds Timed up and go (TUG): 15 seconds  GAIT: Mild limp on the right leg at times uses the walls for stability, no device used                                                                                                                                 TREATMENT DATE:  03/18/24 Evaluation, HEP    PATIENT EDUCATION:  Education details: HEP/POC Person educated: Patient and Spouse Education method: Explanation, Demonstration, Actor cues, Verbal cues, and Handouts Education comprehension: verbalized  understanding and patient with difficulty with comprehending and following instructions  HOME EXERCISE PROGRAM: Access Code: [email or web address] URL: [email or web address] Date: 03/18/2024 Prepared by: Ozell Mainland  Exercises - Seated Hamstring Stretch with Chair  - 2 x daily - 7 x weekly - 1 sets - 5 reps - 30 hold - Gastroc Stretch on Wall  - 2 x daily - 7 x weekly - 1 sets - 5 reps - 30 hold - Supine Piriformis Stretch Pulling Heel to Hip  - 2 x daily - 7 x weekly - 1 sets - 5 reps - 30 hold  ASSESSMENT:  CLINICAL IMPRESSION: Patient is a 69 y.o. male who was seen today for physical therapy evaluation and treatment for right leg pain and left knee pain.  Had a stroke about 10 years ago has had right LE pain since that time, some GT bursitis.  Also with left knee pain from DJD with him having to compensate some from the past stroke.  He doe shave some processing issues and at times requires multiple instructions to do a task.  He is very tight in the LE mms.  OBJECTIVE IMPAIRMENTS: Abnormal gait, cardiopulmonary status limiting activity, decreased activity tolerance, decreased balance, decreased coordination, decreased endurance, decreased mobility, difficulty walking, decreased ROM, decreased strength, increased fascial restrictions, increased muscle spasms, impaired flexibility, improper body mechanics, postural dysfunction, and pain.   REHAB POTENTIAL: Good  CLINICAL DECISION MAKING: Stable/uncomplicated  EVALUATION COMPLEXITY: Low   GOALS: Goals reviewed with patient? Yes  SHORT TERM GOALS: Target date:  04/13/24 Independent with initial HEP Baseline: Goal status: INITIAL   LONG TERM GOALS: Target date: 06/16/24  Independent with advanced HEP Baseline:  Goal status: INITIAL  2.  Improve SLR to 70 degrees Baseline: 40 degrees at eval Goal status: INITIAL  3.  Decrease pain 25% in the left knee Baseline: 3-8/10 at most times Goal status: INITIAL  4.  Decrease TUG time to 10 seconds Baseline: 15 seconds Goal status: INITIAL  5.  Be able to walk daily for exercise Baseline: I walk here and there not enough Goal status: INITIAL   PLAN:  PT FREQUENCY: 1x/week  PT DURATION: 12 weeks  PLANNED INTERVENTIONS: 97164- PT Re-evaluation, 97110-Therapeutic exercises, 97530- Therapeutic activity, 97112- Neuromuscular re-education, 97535- Self Care, [id number]- Manual therapy, [id number]- Gait training, [id number]- Electrical stimulation (unattended), 97016- Vasopneumatic device, [id number]- Ultrasound, [id number]- Ionotophoresis 4mg /ml Dexamethasone , Patient/Family education, Taping, Joint mobilization, Cryotherapy, and Moist heat  PLAN FOR NEXT SESSION: gym exercises, flexibility   Taishawn Smaldone W, PT 03/18/2024, 9:34 AM  "

## 2024-03-28 ENCOUNTER — Encounter: Payer: Self-pay | Admitting: Physical Therapy

## 2024-03-28 ENCOUNTER — Ambulatory Visit: Admitting: Physical Therapy

## 2024-03-28 ENCOUNTER — Telehealth: Payer: Self-pay

## 2024-03-28 DIAGNOSIS — M79604 Pain in right leg: Secondary | ICD-10-CM

## 2024-03-28 DIAGNOSIS — M25562 Pain in left knee: Secondary | ICD-10-CM

## 2024-03-28 DIAGNOSIS — M6281 Muscle weakness (generalized): Secondary | ICD-10-CM

## 2024-03-28 DIAGNOSIS — R262 Difficulty in walking, not elsewhere classified: Secondary | ICD-10-CM

## 2024-03-28 NOTE — Therapy (Signed)
 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/2015   38 mm by echo 12/2020; 40mm aortic arch by echo 11/2023   Basal cell carcinoma    Bradycardia 10/21/2015   Contusion of calf, left, initial encounter 12/03/2015   CVA (cerebral vascular accident) [facility]) 2016   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   Left post branch MCA infarct hasRLE>> RUE symptoms    Dilated aortic root    40 mm by echo 12/2020   Hyperlipidemia LDL goal <70 05/31/2016   ICAO (internal carotid artery occlusion), left 07/10/2016   chronically occluded left ICA and s/p right CEA at [facility]   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   Nephrolithiasis    Neuropathy    PAF (paroxysmal atrial fibrillation) (HCC) 07/10/2016   s/p afib ablation 02/2017   Typical atrial flutter (HCC)    Past Surgical History:  Procedure Laterality Date   ATRIAL FIBRILLATION ABLATION N/A 03/27/2017   Procedure: ATRIAL FIBRILLATION ABLATION;  Surgeon: Kelsie Agent, MD;  Location: [facility] INVAS[facility] CV LAB;  Service: Cardiovascular;  Laterality: N/A;   LITHOTRIPSY     VASECTOMY     Patient Active Problem List   Diagnosis Date Noted   Greater trochanteric bursitis of right hip 05/03/2021   Impacted cerumen of both ears 04/26/2017   Perennial allergic rhinitis 04/26/2017   Paroxysmal atrial fibrillation (HCC) 03/27/2017   Mild mitral regurgitation  01/25/2017   Carotid disease, bilateral 08/08/2016   PAF (paroxysmal atrial fibrillation) (HCC) 07/10/2016   ICAO (internal carotid artery occlusion), left 07/10/2016   Hyperlipidemia LDL goal <70 05/31/2016   Carotid artery stenosis    Nephrolithiasis 02/08/2016   Abdominal pain, chronic, right lower quadrant 12/03/2015   Contusion of calf, left, initial encounter 12/03/2015   Bradycardia 10/21/2015   Dilated aortic root 10/20/2015   Cerebrovascular accident (CVA) due to embolism of left middle cerebral artery (HCC) 03/12/2015   Altered sensation due to stroke 12/10/2014   Stenosis of right carotid artery 12/10/2014   Occlusion of left internal carotid artery 12/10/2014   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   Embolism of left middle cerebral artery 09/29/2014   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   Acute ischemic left MCA stroke (HCC)    ICAO (internal carotid artery occlusion)    Carotid stenosis    Cerebral infarction (HCC) 09/25/2014   Stroke (HCC) 09/25/2014   Persistent atrial fibrillation (HCC) 09/25/2014   SOB (shortness of breath) 05/19/2014   Heart palpitations 05/19/2014    PCP: Dayna, DO  REFERRING PROVIDER: CANDIE Her, MD  REFERRING DIAG: right leg pain, left knee pain  THERAPY DIAG:  Muscle weakness (generalized)  Difficulty in walking, not elsewhere classified  Pain in right leg  Acute pain of left knee  Rationale  for Evaluation and Treatment: Rehabilitation  ONSET DATE: December 2025  SUBJECTIVE:   SUBJECTIVE STATEMENT: Good day, feeling pretty good  PERTINENT HISTORY: Stroke, a-fib, CVA,  PAIN:  Are you having pain? Yes: NPRS scale: pain a 7/10 Pain location: right leg Pain description: sore, ache  Aggravating factors: walking, activity, stairs  pain up to 8/10 Relieving factors: rest, support sleeve, tramadol , lyrica   at best pain a 3/10  PRECAUTIONS: None  RED FLAGS: None   WEIGHT  BEARING RESTRICTIONS: No  FALLS:  Has patient fallen in last 6 months? No  LIVING ENVIRONMENT: Lives with: lives with their family Lives in: House/apartment Stairs: Yes: Internal: 16 steps; can reach both Has following equipment at home: None  OCCUPATION: retired  PLOF: Independent and does some housework, some light yard work, does report walking 2-3x/week  PATIENT GOALS: have less pain, walk better  NEXT MD VISIT: about a month  OBJECTIVE:  Note: Objective measures were completed at Evaluation unless otherwise noted.  DIAGNOSTIC FINDINGS: DJD left knee  COGNITION: Overall cognitive status: wife helps with history, some difficulty understanding may have some apraxia and aphasia     SENSATION: Some numbness   MUSCLE LENGTH: Very tight HS and calves, piriformis  POSTURE: rounded shoulders and forward head  PALPATION: Tender in the right GT area and the mms of the right LE  LOWER EXTREMITY ROM:  WFL's has a hard time following commands, needs repeated and time to process   LOWER EXTREMITY MMT:  MMT Right eval Left eval  Hip flexion 4- 4  Hip extension 4- 4  Hip abduction 4- 4  Hip adduction    Hip internal rotation    Hip external rotation    Knee flexion 4 4  Knee extension 4 4  Ankle dorsiflexion 4 4  Ankle plantarflexion    Ankle inversion    Ankle eversion     (Blank rows = not tested)  LOWER EXTREMITY SPECIAL TESTS:  SLS was 5 seconds on each leg  FUNCTIONAL TESTS:  5 times sit to stand: 21 seconds Timed up and go (TUG): 15 seconds  GAIT: Mild limp on the right leg at times uses the walls for stability, no device used                                                                                                                                TREATMENT DATE:  03/28/24 NuStep L 5 x 6 min 6in step ups x10 each CGA Slant bard calf stretch HS curls 35lb 2x10 Sit to stand x10, holding yellow ball  Side steps on airex in // bars Alt cone  taps   03/18/24 Evaluation, HEP    PATIENT EDUCATION:  Education details: HEP/POC Person educated: Patient and Spouse Education method: Explanation, Demonstration, Tactile cues, Verbal cues, and Handouts Education comprehension: verbalized understanding and patient with difficulty with comprehending and following instructions  HOME EXERCISE PROGRAM: Access Code: [email or web address] URL: [email or web address]  Date: 03/18/2024 Prepared by: Ozell Mainland  Exercises - Seated Hamstring Stretch with Chair  - 2 x daily - 7 x weekly - 1 sets - 5 reps - 30 hold - Gastroc Stretch on Wall  - 2 x daily - 7 x weekly - 1 sets - 5 reps - 30 hold - Supine Piriformis Stretch Pulling Heel to Hip  - 2 x daily - 7 x weekly - 1 sets - 5 reps - 30 hold  ASSESSMENT:  CLINICAL IMPRESSION: Patient is a 69 y.o. male who was seen today for physical therapy treatment for right leg pain and left knee pain.  Had a stroke about 10 years ago has had right LE pain since that time, some GT bursitis.  Also with left knee pain from DJD with him having to compensate some from the past stroke. Initiated LE functional strengthening and balance. Inctrase time needed during session for processing. CGA required with step sup and alt box taps. Pt is unsure at times if he is using both LE equally with curls and ext, some may isolate next session.     OBJECTIVE IMPAIRMENTS: Abnormal gait, cardiopulmonary status limiting activity, decreased activity tolerance, decreased balance, decreased coordination, decreased endurance, decreased mobility, difficulty walking, decreased ROM, decreased strength, increased fascial restrictions, increased muscle spasms, impaired flexibility, improper body mechanics, postural dysfunction, and pain.   REHAB POTENTIAL: Good  CLINICAL DECISION MAKING: Stable/uncomplicated  EVALUATION COMPLEXITY: Low   GOALS: Goals reviewed with patient? Yes  SHORT TERM GOALS: Target date:  04/13/24 Independent with initial HEP Baseline: Goal status: Progressing 03/28/24   LONG TERM GOALS: Target date: 06/16/24  Independent with advanced HEP Baseline:  Goal status: INITIAL  2.  Improve SLR to 70 degrees Baseline: 40 degrees at eval Goal status: INITIAL  3.  Decrease pain 25% in the left knee Baseline: 3-8/10 at most times Goal status: INITIAL  4.  Decrease TUG time to 10 seconds Baseline: 15 seconds Goal status: INITIAL  5.  Be able to walk daily for exercise Baseline: I walk here and there not enough Goal status: INITIAL   PLAN:  PT FREQUENCY: 1x/week  PT DURATION: 12 weeks  PLANNED INTERVENTIONS: 97164- PT Re-evaluation, 97110-Therapeutic exercises, 97530- Therapeutic activity, [id number]- Neuromuscular re-education, 97535- Self Care, [id number]- Manual therapy, [id number]- Gait training, [id number]- Electrical stimulation (unattended), 97016- Vasopneumatic device, 97035- Ultrasound, 02966- Ionotophoresis 4mg /ml Dexamethasone , Patient/Family education, Taping, Joint mobilization, Cryotherapy, and Moist heat  PLAN FOR NEXT SESSION: gym exercises, flexibility   Tanda KANDICE Sorrow, PTA 03/28/2024, 11:02 AM  "

## 2024-03-28 NOTE — Telephone Encounter (Signed)
 [name] will need a PA for his Tramadol . Call back phone [phone]. [facility] requesting a 90 day supply).

## 2024-04-04 ENCOUNTER — Encounter: Payer: Self-pay | Admitting: Physical Therapy

## 2024-04-04 ENCOUNTER — Ambulatory Visit: Admitting: Physical Therapy

## 2024-04-04 DIAGNOSIS — R262 Difficulty in walking, not elsewhere classified: Secondary | ICD-10-CM

## 2024-04-04 DIAGNOSIS — M79604 Pain in right leg: Secondary | ICD-10-CM

## 2024-04-04 DIAGNOSIS — M6281 Muscle weakness (generalized): Secondary | ICD-10-CM

## 2024-04-04 DIAGNOSIS — M25562 Pain in left knee: Secondary | ICD-10-CM

## 2024-04-04 NOTE — Therapy (Signed)
 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   Nephrolithiasis    Neuropathy    PAF (paroxysmal atrial fibrillation) (HCC) 07/10/2016   s/p afib ablation 02/2017   Typical atrial flutter (HCC)    Past Surgical History:  Procedure Laterality Date   ATRIAL FIBRILLATION ABLATION N/A 03/27/2017   Procedure: ATRIAL FIBRILLATION ABLATION;  Surgeon: Kelsie Agent, MD;  Location: [facility] INVAS[facility] CV LAB;  Service: Cardiovascular;  Laterality: N/A;   LITHOTRIPSY     VASECTOMY     Patient Active Problem List   Diagnosis Date Noted   Greater trochanteric bursitis of right hip 05/03/2021   Impacted cerumen of both ears 04/26/2017   Perennial allergic rhinitis 04/26/2017   Paroxysmal atrial fibrillation (HCC) 03/27/2017   Mild mitral regurgitation  01/25/2017   Carotid disease, bilateral 08/08/2016   PAF (paroxysmal atrial fibrillation) (HCC) 07/10/2016   ICAO (internal carotid artery occlusion), left 07/10/2016   Hyperlipidemia LDL goal <70 05/31/2016   Carotid artery stenosis    Nephrolithiasis 02/08/2016   Abdominal pain, chronic, right lower quadrant 12/03/2015   Contusion of calf, left, initial encounter 12/03/2015   Bradycardia 10/21/2015   Dilated aortic root 10/20/2015   Cerebrovascular accident (CVA) due to embolism of left middle cerebral artery (HCC) 03/12/2015   Altered sensation due to stroke 12/10/2014   Stenosis of right carotid artery 12/10/2014   Occlusion of left internal carotid artery 12/10/2014   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   Embolism of left middle cerebral artery 09/29/2014   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   Acute ischemic left MCA stroke (HCC)    ICAO (internal carotid artery occlusion)    Carotid stenosis    Cerebral infarction (HCC) 09/25/2014   Stroke (HCC) 09/25/2014   Persistent atrial fibrillation (HCC) 09/25/2014   SOB (shortness of breath) 05/19/2014   Heart palpitations 05/19/2014    PCP: Dayna, DO  REFERRING PROVIDER: CANDIE Her, MD  REFERRING DIAG: right leg pain, left knee pain  THERAPY DIAG:  Muscle weakness (generalized)  Pain in right leg  Acute pain of left knee  Difficulty in walking, not elsewhere classified  Rationale  for Evaluation and Treatment: Rehabilitation  ONSET DATE: December 2025  SUBJECTIVE:   SUBJECTIVE STATEMENT: Doing pretty good  PERTINENT HISTORY: Stroke, a-fib, CVA,  PAIN:  Are you having pain? Yes: NPRS scale: pain a 0/10 Pain location: right leg Pain description: sore, ache  Aggravating factors: walking, activity, stairs  pain up to 8/10 Relieving factors: rest, support sleeve, tramadol , lyrica   at best pain a 3/10  PRECAUTIONS: None  RED FLAGS: None   WEIGHT BEARING  RESTRICTIONS: No  FALLS:  Has patient fallen in last 6 months? No  LIVING ENVIRONMENT: Lives with: lives with their family Lives in: House/apartment Stairs: Yes: Internal: 16 steps; can reach both Has following equipment at home: None  OCCUPATION: retired  PLOF: Independent and does some housework, some light yard work, does report walking 2-3x/week  PATIENT GOALS: have less pain, walk better  NEXT MD VISIT: about a month  OBJECTIVE:  Note: Objective measures were completed at Evaluation unless otherwise noted.  DIAGNOSTIC FINDINGS: DJD left knee  COGNITION: Overall cognitive status: wife helps with history, some difficulty understanding may have some apraxia and aphasia     SENSATION: Some numbness   MUSCLE LENGTH: Very tight HS and calves, piriformis  POSTURE: rounded shoulders and forward head  PALPATION: Tender in the right GT area and the mms of the right LE  LOWER EXTREMITY ROM:  WFL's has a hard time following commands, needs repeated and time to process   LOWER EXTREMITY MMT:  MMT Right eval Left eval  Hip flexion 4- 4  Hip extension 4- 4  Hip abduction 4- 4  Hip adduction    Hip internal rotation    Hip external rotation    Knee flexion 4 4  Knee extension 4 4  Ankle dorsiflexion 4 4  Ankle plantarflexion    Ankle inversion    Ankle eversion     (Blank rows = not tested)  LOWER EXTREMITY SPECIAL TESTS:  SLS was 5 seconds on each leg  FUNCTIONAL TESTS:  5 times sit to stand: 21 seconds Timed up and go (TUG): 15 seconds  GAIT: Mild limp on the right leg at times uses the walls for stability, no device used                                                                                                                                TREATMENT DATE:  04/04/24 NuStep L 5 x 6 min 30lb ressited gait 4 way x3 each 6in lateral step ups x10 each Heel raises black bar 2x15 HEP Sit to stands 2x10 Standing march w/ counter support HS curls  35lb 2x10 Leg Ext x10, SL 5lb 2x10 each  03/28/24 NuStep L 5 x 6 min 6in step ups x10 each CGA Slant bard calf stretch HS curls 35lb 2x10 Sit to stand x10, holding yellow ball  Side steps on airex in // bars Alt cone taps   03/18/24 Evaluation, HEP  PATIENT EDUCATION:  Education details: HEP/POC Person educated: Patient and Spouse Education method: Explanation, Demonstration, Actor cues, Verbal cues, and Handouts Education comprehension: verbalized understanding and patient with difficulty with comprehending and following instructions  HOME EXERCISE PROGRAM: Access Code: [email or web address] URL: [email or web address] Date: 04/04/2024 Prepared by: Tanda Sorrow  Exercises - Seated Hamstring Stretch with Chair  - 2 x daily - 7 x weekly - 1 sets - 5 reps - 30 hold - Gastroc Stretch on Wall  - 2 x daily - 7 x weekly - 1 sets - 5 reps - 30 hold - Supine Piriformis Stretch Pulling Heel to Hip  - 2 x daily - 7 x weekly - 1 sets - 5 reps - 30 hold - Sit to Stand  - 1 x daily - 7 x weekly - 3 sets - 10 reps - Standing March with Counter Support  - 1 x daily - 7 x weekly - 3 sets - 10 reps  ASSESSMENT:  CLINICAL IMPRESSION: Patient is a 69 y.o. male who was seen today for physical therapy treatment for right leg pain and left knee pain.  Had a stroke about 10 years ago has had right LE pain since that time, some GT bursitis.  Also with left knee pain from DJD with him having to compensate some from the past stroke. Pt enter reporting his normal pain that he has become accustomed too. Minimal decrease in L knee pain. Continued with LE functional strengthening and balance. Increase time needed during session for cognitive processing. CGA required with lateral step usp and resisted gait.   OBJECTIVE IMPAIRMENTS: Abnormal gait, cardiopulmonary status limiting activity, decreased activity tolerance, decreased balance, decreased coordination, decreased endurance, decreased mobility,  difficulty walking, decreased ROM, decreased strength, increased fascial restrictions, increased muscle spasms, impaired flexibility, improper body mechanics, postural dysfunction, and pain.   REHAB POTENTIAL: Good  CLINICAL DECISION MAKING: Stable/uncomplicated  EVALUATION COMPLEXITY: Low   GOALS: Goals reviewed with patient? Yes  SHORT TERM GOALS: Target date: 04/13/24 Independent with initial HEP Baseline: Goal status: Progressing 03/28/24, Met 04/04/24   LONG TERM GOALS: Target date: 06/16/24  Independent with advanced HEP Baseline:  Goal status: INITIAL  2.  Improve SLR to 70 degrees Baseline: 40 degrees at eval Goal status: INITIAL  3.  Decrease pain 25% in the left knee Baseline: 3-8/10 at most times Goal status: 3-7 progressing  04/04/24   4.  Decrease TUG time to 10 seconds Baseline: 15 seconds Goal status: INITIAL  5.  Be able to walk daily for exercise Baseline: I walk here and there not enough Goal status: INITIAL   PLAN:  PT FREQUENCY: 1x/week  PT DURATION: 12 weeks  PLANNED INTERVENTIONS: 97164- PT Re-evaluation, 97110-Therapeutic exercises, 97530- Therapeutic activity, [id number]- Neuromuscular re-education, 97535- Self Care, 02859- Manual therapy, [id number]- Gait training, [phone]- Electrical stimulation (unattended), 97016- Vasopneumatic device, 97035- Ultrasound, 02966- Ionotophoresis 4mg /ml Dexamethasone , Patient/Family education, Taping, Joint mobilization, Cryotherapy, and Moist heat  PLAN FOR NEXT SESSION: gym exercises, flexibility   Tanda KANDICE Sorrow, PTA 04/04/2024, 11:05 AM  "

## 2024-04-11 ENCOUNTER — Ambulatory Visit: Admitting: Physical Therapy

## 2024-04-18 ENCOUNTER — Ambulatory Visit: Admitting: Physical Therapy

## 2024-09-03 ENCOUNTER — Encounter: Admitting: Registered Nurse
